# Patient Record
Sex: Female | Born: 1955 | Race: White | Hispanic: No | Marital: Married
Health system: Midwestern US, Community
[De-identification: ages and names within clinical notes are randomized; demographics above are authoritative.]

## PROBLEM LIST (undated history)

## (undated) DIAGNOSIS — R922 Inconclusive mammogram: Secondary | ICD-10-CM

## (undated) DIAGNOSIS — R9230 Dense breasts, unspecified: Secondary | ICD-10-CM

## (undated) DIAGNOSIS — M5136 Other intervertebral disc degeneration, lumbar region: Secondary | ICD-10-CM

## (undated) DIAGNOSIS — M51369 Other intervertebral disc degeneration, lumbar region without mention of lumbar back pain or lower extremity pain: Secondary | ICD-10-CM

## (undated) DIAGNOSIS — K811 Chronic cholecystitis: Secondary | ICD-10-CM

## (undated) DIAGNOSIS — R1013 Epigastric pain: Principal | ICD-10-CM

## (undated) DIAGNOSIS — M5459 Other low back pain: Principal | ICD-10-CM

## (undated) DIAGNOSIS — K5900 Constipation, unspecified: Secondary | ICD-10-CM

## (undated) DIAGNOSIS — N183 Chronic kidney disease, stage 3 unspecified (HCC): Principal | ICD-10-CM

## (undated) DIAGNOSIS — K5909 Other constipation: Secondary | ICD-10-CM

## (undated) DIAGNOSIS — M5430 Sciatica, unspecified side: Secondary | ICD-10-CM

## (undated) DIAGNOSIS — F319 Bipolar disorder, unspecified: Secondary | ICD-10-CM

## (undated) DIAGNOSIS — E119 Type 2 diabetes mellitus without complications: Secondary | ICD-10-CM

## (undated) HISTORY — PX: SHOULDER SURGERY: SHX246

## (undated) HISTORY — PX: ABDOMINAL HYSTERECTOMY: SHX81

## (undated) HISTORY — PX: EXPLORATORY LAPAROTOMY: SUR591

## (undated) HISTORY — PX: TONSILLECTOMY: SUR1361

---

## 2009-07-13 ENCOUNTER — Inpatient Hospital Stay
Admit: 2009-07-13 | Disposition: A | Discharge status: Home / Self Care | Attending: Emergency Medicine | Admitting: Emergency Medicine

## 2009-07-13 LAB — CBC PANEL ITEM
Basophils %: 0.6 % (ref 0–1)
Basophils: 0.06 10*3/uL (ref 0.0–0.20)
Eosinophils %: 0.28 10*3/uL (ref 0.0–0.60)
Eosinophils %: 2.7 % (ref 1–5)
Hematocrit: 40 % (ref 37–47)
Hgb: 13.1 G/DL (ref 12.0–16.0)
Lymphocytes: 2.99 10*3/uL (ref 1.1–4.5)
Lymphocytes: 28.8 % (ref 20–40)
MCH: 28.6 PG (ref 27–31)
MCHC: 32.8 G/DL — ABNORMAL LOW (ref 33–37)
MCV: 87.3 FL (ref 81–99)
MPV: 10.9 FL (ref 10.2–13.2)
Monocytes %: 5.9 % (ref 0–10)
Monocytes: 0.61 10*3/uL (ref 0.0–0.9)
Neutrophils %: 62 % (ref 50–70)
Neutrophils Absolute: 6.44 10*3/uL (ref 1.5–7.5)
Platelet Count: 222 (ref 130–400)
RBC: 4.58 MIL/uL (ref 4.2–5.4)
RDW: 12.9 % (ref 11.0–14.0)
WBC: 10.38 10*3/uL (ref 4.8–10.8)

## 2009-07-13 LAB — COMPREHENSIVE PANEL
ALT: 58 IU/L — ABNORMAL HIGH (ref 7–35)
AST: 22 IU/L (ref 8–41)
Albumin: 4.6 G/DL (ref 3.4–4.8)
Alk Phos: 75 IU/L (ref 18–125)
Anion Gap: 7 MEQ/L (ref 7–19)
BUN: 9 MG/DL (ref 6–20)
CO2: 28 MEQ/L (ref 23–29)
Calcium: 10.6 MG/DL — ABNORMAL HIGH (ref 8.5–10.3)
Chloride: 109 MEQ/L (ref 98–111)
Creatinine: 1 MG/DL (ref 0.3–1.3)
Gfr Calculated: 62 mL/min (ref >59)
Glucose: 105 MG/DL
Potassium: 4.5 mEq/L (ref 3.5–5.0)
Sodium: 144 MEQ/L (ref 136–145)
Total Bilirubin: 0.5 MG/DL (ref 0.3–1.2)
Total Protein: 6.9 G/DL (ref 6.4–8.3)

## 2009-07-13 LAB — URINALYSIS
Bilirubin, Urine: NEGATIVE
Glucose, Ur: NEGATIVE MG/DL
Ketones, Urine: NEGATIVE MG/DL
Nitrite, Urine: NEGATIVE
Occult Blood,Urine: NEGATIVE
Protein, UA: NEGATIVE MG/DL
Specific Gravity, Urine: <=1.005 (ref 1.001–1.035)
Urobilinogen, Urine: 0.2 EU/DL
pH, UA: 7.5

## 2009-07-13 LAB — MULTI DRUG SCREEN, URINE
Amphetamines: NEGATIVE
Barbiturates: NEGATIVE
Benzodiazepines: NEGATIVE
Cannabinoids: NEGATIVE
Cocaine: NEGATIVE
Opiate Scrn, Interp: NEGATIVE

## 2009-07-13 LAB — ETHANOL
Alcohol Control: 40 MG/DL
Alcohol, Ethyl (B): <10 MG/DL
Phlebotomist: 8102

## 2009-07-14 LAB — TSH: TSH: 2.247 u[IU]/mL (ref 0.35–5.5)

## 2009-07-14 LAB — FREE T4: T4 Free: 1.1 NG/DL (ref 0.8–1.75)

## 2009-07-14 LAB — FERRITIN: Ferritin: 122 NG/ML (ref 10–291)

## 2009-07-15 LAB — CULTURE, URINE

## 2009-07-15 LAB — LITHIUM LEVEL: Lithium Lvl: 1.42 MEQ/L — ABNORMAL HIGH (ref 0.6–1.2)

## 2009-07-17 LAB — VITAMIN D 25 HYDROXY: Vit D, 25-Hydroxy: 13 ng/mL — ABNORMAL LOW (ref 30–80)

## 2009-07-18 LAB — LITHIUM LEVEL: Lithium Lvl: 0.97 MEQ/L (ref 0.6–1.2)

## 2009-07-19 LAB — LITHIUM LEVEL: Lithium Lvl: 1.12 MEQ/L (ref 0.6–1.2)

## 2009-07-20 LAB — FOLATE: Folic Acid, Serum: 7.45 NG/ML (ref 5.4–24.0)

## 2009-07-20 LAB — FREE T4: T4 Free: 0.9 NG/DL (ref 0.8–1.75)

## 2009-07-20 LAB — T3, FREE: T3, Free: 2.6 PG/ML (ref 2.3–4.2)

## 2009-07-20 LAB — VITAMIN B12: Vitamin B-12: 548 PG/ML (ref 211–911)

## 2009-07-20 LAB — TSH: TSH: 3.392 u[IU]/mL (ref 0.35–5.5)

## 2010-07-15 LAB — CBC PANEL ITEM
Basophils %: 0.9 % (ref 0–1)
Basophils: 0.09 10*3/uL (ref 0.0–0.20)
Eosinophils %: 0.62 10*3/uL — ABNORMAL HIGH (ref 0.0–0.60)
Eosinophils %: 6.3 % — ABNORMAL HIGH (ref 1–5)
Hematocrit: 37.7 % (ref 37–47)
Hgb: 12.8 G/DL (ref 12.0–16.0)
Lymphocytes: 2.94 10*3/uL (ref 1.1–4.5)
Lymphocytes: 30.1 % (ref 20–40)
MCH: 29.4 PG (ref 27–31)
MCHC: 34 G/DL (ref 33–37)
MCV: 86.5 FL (ref 81–99)
MPV: 10.6 FL (ref 10.2–13.2)
Monocytes %: 5.7 % (ref 0–10)
Monocytes: 0.56 10*3/uL (ref 0.0–0.9)
Neutrophils %: 57 % (ref 50–70)
Neutrophils Absolute: 5.57 10*3/uL (ref 1.5–7.5)
Platelet Count: 265 (ref 130–400)
RBC: 4.36 MIL/uL (ref 4.2–5.4)
RDW: 12.5 % (ref 11.0–14.0)
WBC: 9.78 10*3/uL (ref 4.8–10.8)

## 2010-07-15 LAB — COMPREHENSIVE PANEL
ALT: 31 IU/L (ref 7–35)
AST: 16 IU/L (ref 8–41)
Albumin: 4.3 G/DL (ref 3.4–4.8)
Alk Phos: 58 IU/L (ref 18–125)
Anion Gap: 8 MEQ/L (ref 7–19)
BUN: 14 MG/DL (ref 6–20)
CO2: 26 MEQ/L (ref 23–29)
Calcium: 9.9 MG/DL (ref 8.5–10.3)
Chloride: 111 MEQ/L (ref 98–111)
Creatinine: 1.3 MG/DL (ref 0.3–1.3)
Gfr Calculated: 45 mL/min — ABNORMAL LOW (ref >59)
Glucose: 85 MG/DL
Potassium: 4.1 mEq/L (ref 3.5–5.0)
Sodium: 145 MEQ/L (ref 136–145)
Total Bilirubin: 0.3 MG/DL (ref 0.3–1.2)
Total Protein: 6.7 G/DL (ref 6.4–8.3)

## 2010-07-15 LAB — LITHIUM LEVEL
Lithium Lvl: 1.48 MEQ/L — ABNORMAL HIGH (ref 0.6–1.2)
Lithium Lvl: 1.35 MEQ/L — ABNORMAL HIGH (ref 0.6–1.2)

## 2010-09-24 LAB — LITHIUM LEVEL: Lithium Lvl: 1.1 MEQ/L (ref 0.6–1.2)

## 2012-01-03 LAB — CBC PANEL ITEM
Basophils %: 0.9 % (ref 0–1)
Basophils: 0.07 10*3/uL (ref 0.0–0.20)
Eosinophils %: 0.29 10*3/uL (ref 0.0–0.60)
Eosinophils %: 3.6 % (ref 1–5)
Hematocrit: 36.6 % — ABNORMAL LOW (ref 37–47)
Hemoglobin: 12.7 G/DL (ref 12.0–16.0)
Lymphocytes: 2.62 10*3/uL (ref 1.1–4.5)
Lymphocytes: 32.3 % (ref 20–40)
MCH: 29 PG (ref 27–31)
MCHC: 34.7 G/DL (ref 33–37)
MCV: 83.6 FL (ref 81–99)
MPV: 10.2 FL (ref 10.2–13.2)
Monocytes %: 5.4 % (ref 0–10)
Monocytes: 0.44 10*3/uL (ref 0.0–0.9)
Neutrophils %: 57.8 % (ref 50–70)
Neutrophils Absolute: 4.7 10*3/uL (ref 1.5–7.5)
Platelets: 234 10*3/uL (ref 130–400)
RBC: 4.38 MIL/uL (ref 4.2–5.4)
RDW: 12.5 % (ref 11.0–14.0)
WBC: 8.12 10*3/uL (ref 4.8–10.8)

## 2012-01-03 LAB — COMPREHENSIVE PANEL
ALT: 16 U/L (ref 5–33)
AST: 10 U/L (ref 5–32)
Albumin: 4.4 G/DL (ref 3.5–5.2)
Alkaline Phosphatase: 59 U/L (ref 35–187)
Anion Gap: 12 mmol/L (ref 7–19)
BUN: 16 MG/DL (ref 5–20)
CO2: 25 mmol/L (ref 22–29)
Calcium: 9.9 MG/DL (ref 8.8–10.2)
Chloride: 104 mmol/L (ref 98–111)
Creatinine: 1.3 MG/DL — ABNORMAL HIGH (ref 0.5–0.9)
Gfr Calculated: 45 mL/min — ABNORMAL LOW (ref 59–300)
Glucose: 130 MG/DL
Potassium: 4 mmol/L (ref 3.5–5.0)
Sodium: 141 mmol/L (ref 136–145)
Total Bilirubin: 0.3 MG/DL (ref 0.3–1.2)
Total Protein: 6.4 G/DL — ABNORMAL LOW (ref 6.6–8.7)

## 2012-01-03 LAB — AMYLASE: Amylase: 129 U/L — ABNORMAL HIGH (ref 28–100)

## 2012-01-03 LAB — LIPASE: Lipase: 97 U/L — ABNORMAL HIGH (ref 13–60)

## 2012-01-03 LAB — POCT TROPONIN: POC Troponin I: 0 NG/ML — ABNORMAL LOW (ref 0.000–0.08)

## 2012-01-04 LAB — POCT TROPONIN: POC Troponin I: 0 NG/ML — ABNORMAL LOW (ref 0.000–0.08)

## 2012-02-14 ENCOUNTER — Inpatient Hospital Stay
Admit: 2012-02-14 | Disposition: A | Discharge status: Home / Self Care | Attending: Emergency Medicine | Admitting: Emergency Medicine

## 2012-02-14 LAB — ETHANOL
Alcohol Control: 38.8 MG/DL
Alcohol, Ethyl (B): <10 MG/DL

## 2012-02-14 LAB — COMPREHENSIVE PANEL
ALT: 19 U/L (ref 5–33)
AST: 11 U/L (ref 5–32)
Albumin: 4.4 G/DL (ref 3.5–5.2)
Alkaline Phosphatase: 61 U/L (ref 35–187)
Anion Gap: 11 mmol/L (ref 7–19)
BUN: 27 MG/DL — ABNORMAL HIGH (ref 5–20)
CO2: 22 mmol/L (ref 22–29)
Calcium: 9.8 MG/DL (ref 8.8–10.2)
Chloride: 106 mmol/L (ref 98–111)
Creatinine: 1.5 MG/DL — ABNORMAL HIGH (ref 0.5–0.9)
Gfr Calculated: 38 mL/min — ABNORMAL LOW (ref 59–300)
Glucose: 94 MG/DL
Potassium: 4.1 mmol/L (ref 3.5–5.0)
Sodium: 139 mmol/L (ref 136–145)
Total Bilirubin: 0.2 MG/DL — ABNORMAL LOW (ref 0.3–1.2)
Total Protein: 6.8 G/DL (ref 6.6–8.7)

## 2012-02-14 LAB — CBC PANEL ITEM
Basophils %: 0.8 % (ref 0–1)
Basophils: 0.06 10*3/uL (ref 0.0–0.20)
Eosinophils %: 0.28 10*3/uL (ref 0.0–0.60)
Eosinophils %: 3.6 % (ref 1–5)
Hematocrit: 37.6 % (ref 37–47)
Hemoglobin: 12.6 G/DL (ref 12.0–16.0)
Lymphocytes: 2.38 10*3/uL (ref 1.1–4.5)
Lymphocytes: 30.7 % (ref 20–40)
MCH: 28.7 PG (ref 27–31)
MCHC: 33.5 G/DL (ref 33–37)
MCV: 85.6 FL (ref 81–99)
MPV: 10 FL — ABNORMAL LOW (ref 10.2–13.2)
Monocytes %: 6.5 % (ref 0–10)
Monocytes: 0.5 10*3/uL (ref 0.0–0.9)
Neutrophils %: 58.4 % (ref 50–70)
Neutrophils Absolute: 4.52 10*3/uL (ref 1.5–7.5)
Platelets: 236 10*3/uL (ref 130–400)
RBC: 4.39 MIL/uL (ref 4.2–5.4)
RDW: 12.6 % (ref 11.0–14.0)
WBC: 7.74 10*3/uL (ref 4.8–10.8)

## 2012-02-14 LAB — MULTI DRUG SCREEN, URINE
Amphetamines: NEGATIVE
Barbiturates: NEGATIVE
Benzodiazepines: NEGATIVE
Cannabinoids: NEGATIVE
Cocaine: NEGATIVE
Opiate Scrn, Interp: NEGATIVE

## 2012-05-22 LAB — URINALYSIS
Bilirubin Urine: NEGATIVE
Glucose, Ur: NEGATIVE MG/DL
Ketones, Urine: NEGATIVE MG/DL
Nitrite, Urine: NEGATIVE
Occult Blood,Urine: NEGATIVE
Protein, UA: NEGATIVE MG/DL
Specific Gravity, Urine: 1.005 (ref 1.001–1.035)
Urobilinogen, Urine: 0.2 EU/DL
pH, UA: 6

## 2012-05-22 LAB — COMPREHENSIVE PANEL
ALT: 18 U/L (ref 5–33)
AST: 10 U/L (ref 5–32)
Albumin: 4 G/DL (ref 3.5–5.2)
Alkaline Phosphatase: 52 U/L (ref 35–187)
Anion Gap: 12 mmol/L (ref 7–19)
BUN: 25 MG/DL — ABNORMAL HIGH (ref 5–20)
CO2: 21 mmol/L — ABNORMAL LOW (ref 22–29)
Calcium: 9.3 MG/DL (ref 8.8–10.2)
Chloride: 104 mmol/L (ref 98–111)
Creatinine: 1.4 MG/DL — ABNORMAL HIGH (ref 0.5–0.9)
Gfr Calculated: 41 mL/min — ABNORMAL LOW (ref 59–300)
Glucose: 139 MG/DL
Potassium: 3.8 mmol/L (ref 3.5–5.0)
Sodium: 137 mmol/L (ref 136–145)
Total Bilirubin: 0.2 MG/DL — ABNORMAL LOW (ref 0.3–1.2)
Total Protein: 6 G/DL — ABNORMAL LOW (ref 6.6–8.7)

## 2012-05-22 LAB — CBC PANEL ITEM
Basophils %: 0.5 % (ref 0–1)
Basophils: 0.04 10*3/uL (ref 0.0–0.20)
Eosinophils %: 0.33 10*3/uL (ref 0.0–0.60)
Eosinophils %: 4 % (ref 1–5)
Hematocrit: 36.9 % — ABNORMAL LOW (ref 37–47)
Hemoglobin: 12.6 G/DL (ref 12.0–16.0)
Lymphocytes: 3.3 10*3/uL (ref 1.1–4.5)
Lymphocytes: 40 % (ref 20–40)
MCH: 28.5 PG (ref 27–31)
MCHC: 34.1 G/DL (ref 33–37)
MCV: 83.5 FL (ref 81–99)
MPV: 10 FL — ABNORMAL LOW (ref 10.2–13.2)
Monocytes %: 8.1 % (ref 0–10)
Monocytes: 0.67 10*3/uL (ref 0.0–0.9)
Neutrophils %: 47.4 % — ABNORMAL LOW (ref 50–70)
Neutrophils Absolute: 3.92 10*3/uL (ref 1.5–7.5)
Platelets: 176 10*3/uL (ref 130–400)
RBC: 4.42 MIL/uL (ref 4.2–5.4)
RDW: 12.5 % (ref 11.0–14.0)
WBC: 8.26 10*3/uL (ref 4.8–10.8)

## 2012-05-22 LAB — AMYLASE: Amylase: 141 U/L — ABNORMAL HIGH (ref 28–100)

## 2012-05-22 LAB — LIPASE: Lipase: 109 U/L — ABNORMAL HIGH (ref 13–60)

## 2012-05-25 LAB — URINALYSIS
Bacteria, UA: NEGATIVE /HPF
Bilirubin Urine: NEGATIVE
Epithelial Cells, Wet Prep: 2 /HPF (ref 0–3)
Glucose, Ur: NEGATIVE MG/DL
Hyaline Casts, UA: 0 /LPF (ref 0–3)
Ketones, Urine: NEGATIVE MG/DL
Nitrite, Urine: NEGATIVE
Occult Blood,Urine: NEGATIVE
Protein, UA: NEGATIVE MG/DL
RBC, UA: 2 /HPF (ref 0–3)
Specific Gravity, Urine: 1.008 (ref 1.001–1.035)
Urobilinogen, Urine: 0.2 EU/DL
WBC, UA: 10 /HPF — ABNORMAL HIGH (ref 0–3)
pH, UA: 6.5

## 2012-05-25 LAB — COMPREHENSIVE PANEL
ALT: 18 U/L (ref 5–33)
AST: 11 U/L (ref 5–32)
Albumin: 4 G/DL (ref 3.5–5.2)
Alkaline Phosphatase: 49 U/L (ref 35–187)
Anion Gap: 9 mmol/L (ref 7–19)
BUN: 18 MG/DL (ref 5–20)
CO2: 26 mmol/L (ref 22–29)
Calcium: 9.5 MG/DL (ref 8.8–10.2)
Chloride: 105 mmol/L (ref 98–111)
Creatinine: 1.3 MG/DL — ABNORMAL HIGH (ref 0.5–0.9)
Gfr Calculated: 45 mL/min — ABNORMAL LOW (ref 59–300)
Glucose: 91 MG/DL
Potassium: 4 mmol/L (ref 3.5–5.0)
Sodium: 140 mmol/L (ref 136–145)
Total Bilirubin: 0.2 MG/DL — ABNORMAL LOW (ref 0.3–1.2)
Total Protein: 6.2 G/DL — ABNORMAL LOW (ref 6.6–8.7)

## 2012-05-25 LAB — CBC PANEL ITEM
Basophils %: 0.4 % (ref 0–1)
Basophils: 0.03 10*3/uL (ref 0.0–0.20)
Eosinophils %: 0.31 10*3/uL (ref 0.0–0.60)
Eosinophils %: 4.6 % (ref 1–5)
Hematocrit: 36.7 % — ABNORMAL LOW (ref 37–47)
Hemoglobin: 12.5 G/DL (ref 12.0–16.0)
Lymphocytes: 2.27 10*3/uL (ref 1.1–4.5)
Lymphocytes: 33.3 % (ref 20–40)
MCH: 29.3 PG (ref 27–31)
MCHC: 34.1 G/DL (ref 33–37)
MCV: 86.2 FL (ref 81–99)
MPV: 11.2 FL (ref 10.2–13.2)
Monocytes %: 8.7 % (ref 0–10)
Monocytes: 0.59 10*3/uL (ref 0.0–0.9)
Neutrophils %: 53 % (ref 50–70)
Neutrophils Absolute: 3.61 10*3/uL (ref 1.5–7.5)
Platelets: 190 10*3/uL (ref 130–400)
RBC: 4.26 MIL/uL (ref 4.2–5.4)
RDW: 12.4 % (ref 11.0–14.0)
WBC: 6.81 10*3/uL (ref 4.8–10.8)

## 2012-05-25 LAB — POCT TROPONIN: POC Troponin I: 0 NG/ML — ABNORMAL LOW (ref 0.000–0.08)

## 2012-05-27 LAB — COMPREHENSIVE PANEL
ALT: 21 U/L (ref 5–33)
AST: 13 U/L (ref 5–32)
Albumin: 4.5 G/DL (ref 3.5–5.2)
Alkaline Phosphatase: 56 U/L (ref 35–187)
Anion Gap: 10 mmol/L (ref 7–19)
BUN: 18 MG/DL (ref 5–20)
CO2: 26 mmol/L (ref 22–29)
Calcium: 10.4 MG/DL — ABNORMAL HIGH (ref 8.8–10.2)
Chloride: 103 mmol/L (ref 98–111)
Creatinine: 1.3 MG/DL — ABNORMAL HIGH (ref 0.5–0.9)
Gfr Calculated: 45 mL/min — ABNORMAL LOW (ref 59–300)
Glucose: 124 MG/DL
Potassium: 3.9 mmol/L (ref 3.5–5.0)
Sodium: 139 mmol/L (ref 136–145)
Total Bilirubin: 0.3 MG/DL (ref 0.3–1.2)
Total Protein: 7.3 G/DL (ref 6.6–8.7)

## 2012-05-27 LAB — URINALYSIS
Bacteria, UA: NEGATIVE /HPF
Bilirubin Urine: NEGATIVE
Epithelial Cells, Wet Prep: 0 /HPF (ref 0–3)
Glucose, Ur: NEGATIVE MG/DL
Hyaline Casts, UA: 0 /LPF (ref 0–3)
Ketones, Urine: NEGATIVE MG/DL
Nitrite, Urine: NEGATIVE
Occult Blood,Urine: NEGATIVE
Protein, UA: NEGATIVE MG/DL
RBC, UA: 2 /HPF (ref 0–3)
Specific Gravity, Urine: 1.004 (ref 1.001–1.035)
Urobilinogen, Urine: 0.2 EU/DL
WBC, UA: 8 /HPF — ABNORMAL HIGH (ref 0–3)
pH, UA: 7.5

## 2012-05-27 LAB — CBC PANEL ITEM
Basophils %: 0.8 % (ref 0–1)
Basophils: 0.06 10*3/uL (ref 0.0–0.20)
Eosinophils %: 0.26 10*3/uL (ref 0.0–0.60)
Eosinophils %: 3.3 % (ref 1–5)
Hematocrit: 41.3 % (ref 37–47)
Hemoglobin: 13.9 G/DL (ref 12.0–16.0)
Lymphocytes: 2.69 10*3/uL (ref 1.1–4.5)
Lymphocytes: 34.4 % (ref 20–40)
MCH: 28.7 PG (ref 27–31)
MCHC: 33.7 G/DL (ref 33–37)
MCV: 85.3 FL (ref 81–99)
MPV: 10.7 FL (ref 10.2–13.2)
Monocytes %: 6.4 % (ref 0–10)
Monocytes: 0.5 10*3/uL (ref 0.0–0.9)
Neutrophils %: 55.1 % (ref 50–70)
Neutrophils Absolute: 4.3 10*3/uL (ref 1.5–7.5)
Platelets: 217 10*3/uL (ref 130–400)
RBC: 4.84 MIL/uL (ref 4.2–5.4)
RDW: 12.6 % (ref 11.0–14.0)
WBC: 7.81 10*3/uL (ref 4.8–10.8)

## 2012-05-27 LAB — AMYLASE: Amylase: 133 U/L — ABNORMAL HIGH (ref 28–100)

## 2012-05-27 LAB — LIPASE: Lipase: 83 U/L — ABNORMAL HIGH (ref 13–60)

## 2012-06-10 LAB — CBC PANEL ITEM
Basophils %: 1 % (ref 0–1)
Basophils: 0.07 10*3/uL (ref 0.0–0.20)
Eosinophils %: 0.22 10*3/uL (ref 0.0–0.60)
Eosinophils %: 3.2 % (ref 1–5)
Hematocrit: 39.5 % (ref 37–47)
Hemoglobin: 13.2 G/DL (ref 12.0–16.0)
Lymphocytes: 2.28 10*3/uL (ref 1.1–4.5)
Lymphocytes: 32.9 % (ref 20–40)
MCH: 28.7 PG (ref 27–31)
MCHC: 33.4 G/DL (ref 33–37)
MCV: 85.9 FL (ref 81–99)
MPV: 10.2 FL (ref 10.2–13.2)
Monocytes %: 6.8 % (ref 0–10)
Monocytes: 0.47 10*3/uL (ref 0.0–0.9)
Neutrophils %: 56.1 % (ref 50–70)
Neutrophils Absolute: 3.9 10*3/uL (ref 1.5–7.5)
Platelets: 182 10*3/uL (ref 130–400)
RBC: 4.6 MIL/uL (ref 4.2–5.4)
RDW: 12.9 % (ref 11.0–14.0)
WBC: 6.94 10*3/uL (ref 4.8–10.8)

## 2012-06-10 LAB — COMPREHENSIVE PANEL
ALT: 24 U/L (ref 5–33)
AST: 13 U/L (ref 5–32)
Albumin: 4.2 G/DL (ref 3.5–5.2)
Alkaline Phosphatase: 57 U/L (ref 35–187)
Anion Gap: 10 mmol/L (ref 7–19)
BUN: 15 MG/DL (ref 5–20)
CO2: 25 mmol/L (ref 22–29)
Calcium: 9.6 MG/DL (ref 8.8–10.2)
Chloride: 104 mmol/L (ref 98–111)
Creatinine: 1.3 MG/DL — ABNORMAL HIGH (ref 0.5–0.9)
Gfr Calculated: 45 mL/min — ABNORMAL LOW (ref 59–300)
Glucose: 113 MG/DL
Potassium: 4.2 mmol/L (ref 3.5–5.0)
Sodium: 139 mmol/L (ref 136–145)
Total Bilirubin: 0.3 MG/DL (ref 0.3–1.2)
Total Protein: 6.3 G/DL — ABNORMAL LOW (ref 6.6–8.7)

## 2012-06-10 LAB — URINALYSIS
Bilirubin Urine: NEGATIVE
Glucose, Ur: NEGATIVE MG/DL
Nitrite, Urine: NEGATIVE
Occult Blood,Urine: NEGATIVE
Protein, UA: NEGATIVE MG/DL
Specific Gravity, Urine: 1.021 (ref 1.001–1.035)
Urobilinogen, Urine: 0.2 EU/DL
pH, UA: 6

## 2012-06-30 NOTE — Patient Instructions (Addendum)
Thank you for enrolling in MyChart. Please follow the instructions below to securely access your online medical record. MyChart allows you to send messages to your doctor, view your test results, renew your prescriptions, schedule appointments, and more.     How Do I Sign Up?  1. In your Internet browser, go to [email or web address]  2. Click on the Sign Up Now link in the Sign In box. You will see the New Member Sign Up page.  3. Enter your MyChart Access Code exactly as it appears below. You will not need to use this code after you've completed the sign-up process. If you do not sign up before the expiration date, you must request a new code.  MyChart Access Code: [email or web address]  Expires: 08/29/2012 10:36 AM    4. Enter your Social Security Number ([id number]) and Date of Birth (mm/dd/yyyy) as indicated and click Submit. You will be taken to the next sign-up page.  5. Create a MyChart ID. This will be your MyChart login ID and cannot be changed, so think of one that is secure and easy to remember.  6. Create a MyChart password. You can change your password at any time.  7. Enter your Password Reset Question and Answer. This can be used at a later time if you forget your password.   8. Enter your e-mail address. You will receive e-mail notification when new information is available in MyChart.  9. Click Sign Up. You can now view your medical record.     Additional Information  If you have questions, please contact your physician practice where you receive care. Remember, MyChart is NOT to be used for urgent needs. For medical emergencies, dial 911.    You are going to have a colonoscopy and here are some instructions:  You will be given specific directions regarding restrictions to diet and bowel prep instructions including laxatives. Please read these instructions one week prior to your scheduled procedure to ensure that you are prepared.     Follow prep instructions provided for bowel prep. Take  all of the bowel prep as directed. If you are having problems with nausea, stop your prep for 30-45 min to allow the nausea to subside before resuming your prep.     Nothing to eat or drink after midnight the day of the procedure EXCEPT:  PLEASE TAKE MEDICATION(S) FOR HIGH BLOOD PRESSURE, THYROID, SEIZURES, AND HEART THE MORNING OF THE PROCEDURE WITH A SIP OF WATER  AT LEAST 2 HOURS PRIOR TO ARRIVAL TIME.   YOU MAY ALSO TAKE ANY INHALERS YOU ARE PRESCRIBED.    You will not be able to drive for 24 hours after the procedure due to sedation. Bring a driver with you the day of the procedure.   No aspirin, ibuprofen, naproxen, fish oil or vitamin E for 5 days before procedure.     If you are on blood thinners, clearance from the prescribing physician will be obtained before your procedure is scheduled. Increased risks may be associated with discontinuation of your blood thinner and include, but not limited to, stroke, TIA, or cardiac event.    If polyps are removed during the procedure they will be sent to a pathologist for analysis. You will be notified by mail of the pathology results in 2-3 weeks.     Your physician may also schedule a follow up appointment with the nurse practitioner to discuss pathology, symptoms or to check if you have had any problems related to your procedure. If you prefer  not to return to the office after your procedure please discuss this with your physician on the day of your colonoscopy.   Final recommendations are based on the pathologist report.     Your diet before a colonoscopy bowel preparation is very important to ensure a successful colon exam. It is recommended to consider certain changes to your diet three to four days prior to the procedure.  Remember that your bowels need to be empty for the exam.    What foods are good to eat?  Cut down on heavy solid foods three to four days before the procedure and start introducing lighter meals to your diet. The following food suggestions are  a good part of your diet before a colonoscopy bowel preparation.  Light meat that is easily digestible such as chicken (without the skin)   Potatoes without skin   Cheese   Eggs   A light meal of steamed white fish   Light clear soups    Foods and drinks to avoid  Avoid foods that contain too much fiber. Stay clear of dark colored beverages. They can stick to the walls of the digestive tract and make it difficult to differentiate from blood. Some of these foods are:  Red meat, rice, nuts and vegetables   Milk, other milk based fluids and cream   Most fruit and puddings   Whole grain pasta   Cereals, bran and seeds   Colored beverages, especially those that are red or purple in color   Red colored Jell-O   On the day before the colonoscopy, continue to drink plenty of clear fluids. It is important   to keep yourself hydrated before the exam.     Please follow all instructions as provided for cleansing the bowel. Failure to have an adequately prepped colon may cause you to have incomplete exam with further testing required.

## 2012-06-30 NOTE — Progress Notes (Signed)
Subjective:      Patient ID: Joyce Cohen is a 57 y.o. female.  Chief Complaint   Patient presents with   . Abdominal Pain   . Constipation   . Rectal Bleeding         HPI  PCP: Calton Dach  Pt here for c/o:  (1) RLQ abd pain.  Onset x 20 years, worsening over the past 1-2 months. Was told it was adhesions; has had lysis of adhesions x 2 in the past--last LOA 15 years ago. Location is RLQ. Alleviating factors is massaging the right lower abdomen. Abd pain tends to be worse with eating. Has been on light and liquid diet for several days. Abd pain #3 on scale, progresses over the day.  (2) rectal bleeding. Onset x 1 month. Notices bright red blood on tissue with wiping, not mixed in stool. No mucous in stool. No unintentional weight loss. Admits to known hx of hemorrhoids. Wants banding if she's a candidate.  (3) constipation. Onset x 6 weeks. On lactulose 15 ml daily. Still has hard pebble stools and has to strain to get it out. Has tried and failed miralax 1 capful daily (x5-6 weeks) with no improvement.  (4) heartburn. Onset x 4 weeks. Has tried Vear Clock MOM with no relief. Is going to try Zantac 150mg  daily. Denies dysphagia and odynophagia.    CT abd/pelvis 05/25/2012-with no acute abnormality    Last colonoscopy 04/2011 (Dr Reed)-"normal" per pt recall.    Family HX:  Pt denies family hx of colon polyps, colon CA, inflammatory bowel dx, gastric CA and esophageal CA.    Past Medical History   Diagnosis Date   . Pleurisy without mention of effusion or current tuberculosis    . Unspecified asthma(493.90)    . Unspecified hemorrhoids without mention of complication    . Abdominal pain, right lower quadrant    . Unspecified constipation    . Arthritis    . Bipolar disorder    . MVP (mitral valve prolapse)    . Cancer      skin cancer   . 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 Stomach ulcer      as a child     Past Surgical History   Procedure Laterality Date   . Rotator cuff repair Right    .  Tonsillectomy     . Hysterectomy     . Appendectomy     . Wisdom tooth extraction     . Abdominal exploration surgery       times two due to adhesions   . Colonoscopy  05/19/11     Dr Renato Gails     History     Social History   . Marital Status: Married     Spouse Name: N/A     Number of Children: N/A   . Years of Education: N/A     Social History Main Topics   . Smoking status: Never Smoker    . Smokeless tobacco: Never Used   . Alcohol Use: No   . Drug Use: No   . Sexually Active: None     Other Topics Concern   . None     Social History Narrative   . None     Current Outpatient Prescriptions on File Prior to Visit   Medication Sig Dispense Refill   . Lactulose Encephalopathy (GENERLAC PO) Take  by mouth.       . sertraline (ZOLOFT)  50 MG tablet Take 50 mg by mouth daily.       . QUEtiapine (SEROQUEL) 50 MG tablet Take 50 mg by mouth daily.       . traZODone (DESYREL) 150 MG tablet Take 300 mg by mouth nightly.       Marland Kitchen buPROPion SR (WELLBUTRIN SR) 150 MG SR tablet Take 150 mg by mouth 2 times daily.       Marland Kitchen lamoTRIgine (LAMICTAL) 150 MG tablet Take 150 mg by mouth daily.       . Loratadine (CLARITIN) 10 MG CAPS Take 1 tablet by mouth daily.         No current facility-administered medications on file prior to visit.       Review of Systems   Constitutional: Positive for fatigue. Negative for fever, appetite change and unexpected weight change.   HENT: Negative for hearing loss and sore throat.    Eyes: Negative for photophobia and visual disturbance.   Respiratory: Negative for cough, choking and wheezing.    Cardiovascular: Negative for chest pain, palpitations and leg swelling.   Gastrointestinal: Positive for nausea (at times), abdominal pain, constipation and anal bleeding. Negative for vomiting, diarrhea, blood in stool, abdominal distention and rectal pain.   Genitourinary: Negative for dysuria and hematuria.   Musculoskeletal: Positive for arthralgias. Negative for myalgias and back pain.   Skin: Negative for  rash.   Allergic/Immunologic: Negative for immunocompromised state.   Neurological: Negative for dizziness and seizures.   Hematological: Does not bruise/bleed easily.   Psychiatric/Behavioral: Negative for confusion and dysphoric mood. The patient is not nervous/anxious.         Hx bipolar       Objective:   Physical Exam   Nursing note and vitals reviewed.  Constitutional: She is oriented to person, place, and time. She appears well-developed and well-nourished.   HENT:   Head: Normocephalic and atraumatic.   Eyes: Conjunctivae and EOM are normal. Pupils are equal, round, and reactive to light.   Neck: Normal range of motion. Neck supple. No thyromegaly present.   Cardiovascular: Normal rate, regular rhythm and normal heart sounds.  Exam reveals no gallop and no friction rub.    No murmur heard.  Pulmonary/Chest: Effort normal and breath sounds normal. No respiratory distress.   Abdominal: Soft. Bowel sounds are normal. She exhibits no distension. There is tenderness (RLQ with mild palpation). There is no rebound.   Musculoskeletal: Normal range of motion.   Neurological: She is alert and oriented to person, place, and time. No cranial nerve deficit.   Skin: Skin is warm and dry. No pallor.   Psychiatric: She has a normal mood and affect. Judgment normal.       Assessment:      1. Constipation  COLONOSCOPY W/ OR W/O BIOPSY   2. Rectal bleeding  COLONOSCOPY W/ OR W/O BIOPSY   3. RLQ abdominal pain  COLONOSCOPY W/ OR W/O BIOPSY            Plan:      1. Increase lactulose to 15ml 2-3x daily til she achieves soft BM daily  2. Schedule outpatient colonoscopy with MAC anesthesia.  Patient advised no Aspirin, Fish Oil, Vit E or NSAIDs 5 (five) days before procedure.  Follow-up Visit: per Dr  Zollie Beckers  Pt education:  Risks, benefits, and alternatives to colonoscopy were discussed. Risks of colonoscopy include, but are not limited to, perforation, bleeding, and infection, Risk of perforation and bleeding are increased if  there  is a polyp removed. All questions answered to the satisfaction of the patient. Pt is agreeable to proceed.   3. Pt instructed that if colon nondiagnostic for source of her abd pain we may do referral to surgeon of her choice. She voiced understanding.

## 2012-07-06 NOTE — Telephone Encounter (Signed)
Patient's husband is calling from [phone] states his wife Joyce Cohen is in extreme pain in the RLQ area and is scheduled for a colonoscopy on 07-15-12 with Dr.Hendon, she is currently taking the Lactulose and it does help her to have BM's and he has taken her to the ER 3-4 times due to the pain, he is asking if you can prescribe something to help with this. Dh cma

## 2012-07-08 ENCOUNTER — Encounter

## 2012-07-08 MED ORDER — LACTULOSE 10 GM/15ML PO SOLN
10 GM/15ML | Freq: Three times a day (TID) | ORAL | Status: DC
Start: 2012-07-08 — End: 2012-07-24

## 2012-07-08 NOTE — Telephone Encounter (Signed)
Pt's husband states that pt needs prescription for generlac.  She was last seen in the office by Danford Bad on 06/30/12.

## 2012-07-08 NOTE — Telephone Encounter (Signed)
Escribed it

## 2012-07-10 NOTE — Telephone Encounter (Signed)
07/09/2012 [facility] called about Joyce Cohen (DOB 03-06-2011) His insurance will only cover 30mL per day of his Latulose. How would you like to handle this please advise. AS cma

## 2012-07-10 NOTE — Telephone Encounter (Signed)
OK, pt should take 15ml BID--if this is ineffective for her constipation she shoud let us know--thanks, Shamera Yarberry

## 2012-07-14 NOTE — Telephone Encounter (Signed)
07/14/2012 Called and left a message with pt husband about her colonoscopy on 07/15/2012.

## 2012-07-23 LAB — URINALYSIS
Bacteria, UA: NEGATIVE /HPF
Bilirubin Urine: NEGATIVE
Epithelial Cells, Wet Prep: 1 /HPF (ref 0–3)
Glucose, Ur: NEGATIVE MG/DL
Hyaline Casts, UA: 0 /LPF (ref 0–3)
Ketones, Urine: NEGATIVE MG/DL
Nitrite, Urine: NEGATIVE
Occult Blood,Urine: NEGATIVE
Protein, UA: NEGATIVE MG/DL
RBC, UA: 1 /HPF (ref 0–3)
Specific Gravity, Urine: 1.012 (ref 1.001–1.035)
Urobilinogen, Urine: 0.2 EU/DL
WBC, UA: 8 /HPF — ABNORMAL HIGH (ref 0–3)
pH, UA: 6.5

## 2012-07-23 LAB — COMPREHENSIVE PANEL
ALT: 24 U/L (ref 5–33)
AST: 13 U/L (ref 5–32)
Albumin: 4.7 G/DL (ref 3.5–5.2)
Alkaline Phosphatase: 59 U/L (ref 35–187)
Anion Gap: 10 mmol/L (ref 7–19)
BUN: 19 MG/DL (ref 5–20)
CO2: 24 mmol/L (ref 22–29)
Calcium: 10.2 MG/DL (ref 8.8–10.2)
Chloride: 105 mmol/L (ref 98–111)
Creatinine: 1.3 MG/DL — ABNORMAL HIGH (ref 0.5–0.9)
Gfr Calculated: 45 mL/min — ABNORMAL LOW (ref 59–300)
Glucose: 87 MG/DL
Potassium: 4 mmol/L (ref 3.5–5.0)
Sodium: 139 mmol/L (ref 136–145)
Total Bilirubin: 0.2 MG/DL — ABNORMAL LOW (ref 0.3–1.2)
Total Protein: 7.4 G/DL (ref 6.6–8.7)

## 2012-07-23 LAB — CBC PANEL ITEM
Basophils %: 0.9 % (ref 0–1)
Basophils: 0.08 10*3/uL (ref 0.0–0.20)
Eosinophils %: 0.24 10*3/uL (ref 0.0–0.60)
Eosinophils %: 2.6 % (ref 1–5)
Hematocrit: 40.9 % (ref 37–47)
Hemoglobin: 13.7 G/DL (ref 12.0–16.0)
Lymphocytes: 3.41 10*3/uL (ref 1.1–4.5)
Lymphocytes: 36.7 % (ref 20–40)
MCH: 28.5 PG (ref 27–31)
MCHC: 33.5 G/DL (ref 33–37)
MCV: 85.2 FL (ref 81–99)
MPV: 10.5 FL (ref 10.2–13.2)
Monocytes %: 6.4 % (ref 0–10)
Monocytes: 0.59 10*3/uL (ref 0.0–0.9)
Neutrophils %: 53.4 % (ref 50–70)
Neutrophils Absolute: 4.96 10*3/uL (ref 1.5–7.5)
Platelets: 209 10*3/uL (ref 130–400)
RBC: 4.8 MIL/uL (ref 4.2–5.4)
RDW: 12.7 % (ref 11.0–14.0)
WBC: 9.28 10*3/uL (ref 4.8–10.8)

## 2012-07-24 MED ORDER — LINACLOTIDE 290 MCG PO CAPS
290 MCG | ORAL_CAPSULE | Freq: Every day | ORAL | Status: DC
Start: 2012-07-24 — End: 2012-12-03

## 2012-07-24 NOTE — Progress Notes (Signed)
Subjective:      Patient ID: Joyce Cohen is a 57 y.o. female.  Chief Complaint   Patient presents with   . Colonoscopy     f/u. Linzess working     HPI  PCP: Calton Dach  Pt here s/p colonoscopy to discuss results. Denies post-procedure complications. Was initiated on Linzess 145 by Dr Zollie Beckers after colonoscopy and she has been taking this and it is working better than the lactulose, but still doesn't have BM daily. Still has RLQ pain she attributes to adhesions, questions about pain management and pain meds. I instructed her we do not give pain meds here-that she will have to contact her PCP.    Last colonoscopy 07/15/2012 (Hendon)-polyp; 5 year recall  (1) POLYP, SIGMOID COLON:  ---FRAGMENT OF BENIGN COLONIC MUCOSA DEMONSTRATING SLIGHT GLANDULAR  HYPERPLASTIC CHANGES AND CAUTERY ARTIFACT.    CT abd/pelvis 05/25/2012-with no acute abnormality     Family HX: Pt denies family hx of colon polyps, colon CA, inflammatory bowel dx, gastric CA and esophageal CA    Past Medical History   Diagnosis Date   . Pleurisy without mention of effusion or current tuberculosis    . Unspecified asthma(493.90)    . Unspecified hemorrhoids without mention of complication    . Abdominal pain, right lower quadrant    . Unspecified constipation    . Arthritis    . Bipolar disorder    . MVP (mitral valve prolapse)    . Cancer      skin cancer   . 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 Stomach ulcer      as a child     Past Surgical History   Procedure Laterality Date   . Rotator cuff repair Right    . Tonsillectomy     . Hysterectomy     . Appendectomy     . Wisdom tooth extraction     . Abdominal exploration surgery       times two due to adhesions   . Colonoscopy  05/19/11     Dr Renato Gails   . Colonoscopy  07/15/2012     Dr. Zollie Beckers     History     Social History   . Marital Status: Married     Spouse Name: N/A     Number of Children: N/A   . Years of Education: N/A     Social History Main Topics   . Smoking status: Never  Smoker    . Smokeless tobacco: Never Used   . Alcohol Use: No   . Drug Use: No   . Sexually Active: None     Other Topics Concern   . None     Social History Narrative   . None     Current Outpatient Prescriptions   Medication Sig Dispense Refill   . dicyclomine (BENTYL) 20 MG tablet Take 20 mg by mouth every 6 hours.       . sertraline (ZOLOFT) 50 MG tablet Take 50 mg by mouth daily.       Marland Kitchen linaclotide (LINZESS) 290 MCG CAPS capsule Take 1 capsule by mouth every morning (before breakfast).  30 capsule  11   . Lactulose Encephalopathy (GENERLAC PO) Take  by mouth.       . QUEtiapine (SEROQUEL) 50 MG tablet Take 50 mg by mouth daily.       . traZODone (DESYREL) 150 MG tablet Take 300 mg by mouth nightly.       Marland Kitchen  buPROPion SR (WELLBUTRIN SR) 150 MG SR tablet Take 150 mg by mouth 2 times daily.       Marland Kitchen lamoTRIgine (LAMICTAL) 150 MG tablet Take 150 mg by mouth daily.         No current facility-administered medications for this visit.       Review of Systems   Constitutional: Positive for fatigue. Negative for fever, appetite change and unexpected weight change.   HENT: Negative for hearing loss and sore throat.    Eyes: Negative for photophobia and visual disturbance.   Respiratory: Negative for cough, choking and wheezing.    Cardiovascular: Negative for chest pain, palpitations and leg swelling.   Gastrointestinal: Positive for abdominal pain (chronic) and constipation. Negative for nausea, vomiting, diarrhea, blood in stool, abdominal distention, anal bleeding and rectal pain.   Genitourinary: Negative for dysuria and hematuria.   Musculoskeletal: Negative for myalgias, back pain and arthralgias.   Skin: Negative for rash.   Allergic/Immunologic: Negative for immunocompromised state.   Neurological: Negative for dizziness and seizures.   Hematological: Does not bruise/bleed easily.   Psychiatric/Behavioral: Negative for confusion and dysphoric mood. The patient is not nervous/anxious.         Bipolar          Objective:   Physical Exam   Nursing note and vitals reviewed.  Constitutional: She is oriented to person, place, and time. She appears well-developed and well-nourished.   HENT:   Head: Normocephalic and atraumatic.   Eyes: Conjunctivae and EOM are normal. Pupils are equal, round, and reactive to light.   Neck: Normal range of motion. Neck supple. No thyromegaly present.   Cardiovascular: Normal rate and regular rhythm.  Exam reveals no gallop and no friction rub.    Murmur (2/6 murmur) heard.  Pulmonary/Chest: Effort normal and breath sounds normal. No respiratory distress.   Abdominal: Soft. Bowel sounds are normal. She exhibits no distension. There is no tenderness. There is no rebound.   Musculoskeletal: Normal range of motion.   Neurological: She is alert and oriented to person, place, and time. No cranial nerve deficit.   Skin: Skin is warm and dry. No pallor.   Psychiatric: She has a normal mood and affect. Judgment normal.       Assessment:      1. Constipation  linaclotide (LINZESS) 290 MCG CAPS capsule   2. Personal history of colonic polyps              Plan:      1. Sample x1 box of Linzess 290mg  daily  Escribed Linzess 290 #30 with 11 refills  2. F/U 1 year refills  3. F/U 5 years colon recall, or sooner if needed

## 2012-07-24 NOTE — Patient Instructions (Signed)
Colon Polyps: After Your Visit  Your Care Instructions  Colon polyps are growths in the colon or the rectum. The cause of most colon polyps is not known, and most people who get them do not have any problems. But a certain kind can turn into cancer. For this reason, regular testing for colon polyps is important for people age 56 and older and anyone who has an increased risk for colon cancer.  Polyps are usually found through routine colon cancer screening tests. Although most colon polyps are not cancerous, they are usually removed and then tested for cancer. Screening for colon cancer saves lives because the cancer can usually be cured if it is caught early.  If you have a polyp that is the type that can turn into cancer, you may need more tests to examine your entire colon. The doctor will remove any other polyps that he or she finds, and you will be tested more often.  Follow-up care is a key part of your treatment and safety. Be sure to make and go to all appointments, and call your doctor if you are having problems. It's also a good idea to know your test results and keep a list of the medicines you take.  How can you care for yourself at home?  Regular exams to look for colon polyps are the best way to prevent polyps from turning into colon cancer. These can include a test for blood in the stool (fecal occult blood test), colonoscopy, and sigmoidoscopy. Talk with your doctor about a testing schedule that is right for you.  To prevent polyps  There is no home treatment for colon polyps. But you can take steps to prevent them from forming.   Get regular exercise and stay at a healthy body weight. Exercise can lower your chance of getting colon cancer. Get at least 30 minutes of exercise on most days of the week. Walking is a good choice. You also may want to do other activities, such as running, swimming, cycling, or playing tennis or team sports.   Limit alcohol to 2 drinks a day for men and 1 drink a day  for women. Too much alcohol can cause health problems.   Do not smoke. Smoking can raise your risk of getting colon polyps. If you need help quitting, talk to your doctor about stop-smoking programs and medicines. These can increase your chances of quitting for good.   Eat lots of fruits and vegetables, and limit animal fat in your diet.  When should you call for help?  Call your doctor now or seek immediate medical care if:   You have severe belly pain.   Your stools are maroon or very bloody.  Watch closely for changes in your health, and be sure to contact your doctor if:   You have a fever.   You have nausea or vomiting.   You have a change in bowel habits (new constipation or diarrhea).   Your symptoms get worse or are not improving as expected.   Where can you learn more?   Go to [email or web address] and sign in to your MyChart account. Enter C571 in the Search Health Information box to learn more about "Colon Polyps: After Your Visit."    If you do not have an account, please click on the "Sign Up Now" link.      2006-2014 [employer]. Care instructions adapted under license by [facility]. This care instruction is for use with your licensed healthcare professional.  If you have questions about a medical condition or this instruction, always ask your healthcare professional. [employer] Inc[employer] disclaims any warranty or liability for your use of this information.  Content Version: 10.0.273164; Last Revised: April 09, 2011

## 2012-08-02 ENCOUNTER — Inpatient Hospital Stay
Admit: 2012-08-02 | Disposition: A | Discharge status: Home / Self Care | Attending: Emergency Medicine | Admitting: Emergency Medicine

## 2012-08-02 LAB — URINALYSIS
Bilirubin Urine: NEGATIVE
Epithelial Cells, Wet Prep: 21.1 /HPF (ref 0–3)
Glucose, Ur: NEGATIVE MG/DL
Ketones, Urine: NEGATIVE MG/DL
Nitrite, Urine: NEGATIVE
Occult Blood,Urine: NEGATIVE
Protein, UA: NEGATIVE MG/DL
RBC, UA: 4.3 /HPF — ABNORMAL HIGH (ref 0–3)
Specific Gravity, Urine: 1.021 (ref 1.001–1.035)
Urobilinogen, Urine: 0.2 EU/DL
WBC, UA: 134.6 /HPF — ABNORMAL HIGH (ref 0–3)
pH, UA: 5.5

## 2012-08-02 LAB — ETHANOL
Alcohol Control: 39.7 MG/DL
Alcohol, Ethyl (B): <10 MG/DL

## 2012-08-02 LAB — MULTI DRUG SCREEN, URINE
Amphetamines: POSITIVE
Barbiturates: NEGATIVE
Benzodiazepines: NEGATIVE
Cannabinoids: NEGATIVE
Cocaine: NEGATIVE
Opiate Scrn, Interp: NEGATIVE

## 2012-08-02 LAB — COMPREHENSIVE PANEL
ALT: 22 U/L (ref 5–33)
AST: 11 U/L (ref 5–32)
Albumin: 4.8 G/DL (ref 3.5–5.2)
Alkaline Phosphatase: 64 U/L (ref 35–187)
Anion Gap: 14 mmol/L (ref 7–19)
BUN: 27 MG/DL — ABNORMAL HIGH (ref 5–20)
CO2: 20 mmol/L — ABNORMAL LOW (ref 22–29)
Calcium: 10.1 MG/DL (ref 8.8–10.2)
Chloride: 105 mmol/L (ref 98–111)
Creatinine: 1.3 MG/DL — ABNORMAL HIGH (ref 0.5–0.9)
Gfr Calculated: 45 mL/min — ABNORMAL LOW (ref 59–300)
Glucose: 93 MG/DL
Potassium: 3.8 mmol/L (ref 3.5–5.0)
Sodium: 139 mmol/L (ref 136–145)
Total Bilirubin: 0.3 MG/DL (ref 0.3–1.2)
Total Protein: 7.7 G/DL (ref 6.6–8.7)

## 2012-08-02 LAB — CBC PANEL ITEM
Basophils %: 0.6 % (ref 0–1)
Basophils: 0.06 10*3/uL (ref 0.0–0.20)
Eosinophils %: 0.16 10*3/uL (ref 0.0–0.60)
Eosinophils %: 1.6 % (ref 1–5)
Hematocrit: 43.8 % (ref 37–47)
Hemoglobin: 14.6 G/DL (ref 12.0–16.0)
Lymphocytes: 2.89 10*3/uL (ref 1.1–4.5)
Lymphocytes: 28.6 % (ref 20–40)
MCH: 28.5 PG (ref 27–31)
MCHC: 33.3 G/DL (ref 33–37)
MCV: 85.4 FL (ref 81–99)
MPV: 10.5 FL (ref 10.2–13.2)
Monocytes %: 6.2 % (ref 0–10)
Monocytes: 0.63 10*3/uL (ref 0.0–0.9)
Neutrophils %: 63 % (ref 50–70)
Neutrophils Absolute: 6.37 10*3/uL (ref 1.5–7.5)
Platelets: 246 10*3/uL (ref 130–400)
RBC: 5.13 MIL/uL (ref 4.2–5.4)
RDW: 13 % (ref 11.0–14.0)
WBC: 10.11 10*3/uL (ref 4.8–10.8)

## 2012-08-03 LAB — TSH: TSH: 1.14 u[IU]/mL (ref 0.27–4.20)

## 2012-08-03 LAB — VITAMIN D 25 HYDROXY: Vit D, 25-Hydroxy: 16.3 NG/ML — ABNORMAL LOW (ref 30–60)

## 2012-10-30 LAB — DRUG PROFILE
Amphetamine Screen, Ur: NEGATIVE ng/mL
Barbiturates: NEGATIVE ng/mL
Benzodiazepines: NEGATIVE ng/mL
Cannabinoids: NEGATIVE ng/mL
Cocaine (Metab.): NEGATIVE ng/mL
Creatinine, Ur: 75.1 mg/dL (ref 20.0–300.0)
Ethanol U, Quan: NEGATIVE %
Fentanyl, Ur: NEGATIVE pg/mL
MEPROBAMATE, REFLEX: NEGATIVE ng/mL
Methadone Screen, Urine: NEGATIVE ng/mL
Opiate Scrn, Ur: NEGATIVE ng/mL
Oxycodone, Urine Confirmation: NEGATIVE ng/mL
PHENCYCLIDINE, REFLEX: NEGATIVE ng/mL
Propoxyphene, Urine: NEGATIVE ng/mL
pH, Urine: 5.2 (ref 4.5–8.9)

## 2012-12-03 ENCOUNTER — Encounter

## 2012-12-03 MED ORDER — LINACLOTIDE 145 MCG PO CAPS
145 MCG | ORAL_CAPSULE | Freq: Every day | ORAL | Status: DC
Start: 2012-12-03 — End: 2013-12-30

## 2012-12-03 NOTE — Telephone Encounter (Signed)
I will escribe Linzess daily

## 2012-12-31 NOTE — Telephone Encounter (Signed)
12/31/2012 Pt's husband stated that when they went to pick up her Linzess they were told insurance didn't cover it. We are going to start a PA for the medication and I gave her som samples of Linzess 145 mg to hold her until the PA goes through.

## 2013-02-21 LAB — COMPREHENSIVE PANEL
ALT: 23 U/L (ref 5–33)
AST: 13 U/L (ref 5–32)
Albumin: 4.4 G/DL (ref 3.5–5.2)
Alkaline Phosphatase: 65 U/L (ref 35–104)
Anion Gap: 13 mmol/L (ref 7–19)
BUN: 20 MG/DL (ref 6–20)
CO2: 23 mmol/L (ref 22–29)
Calcium: 9.6 MG/DL (ref 8.6–10.0)
Chloride: 104 mmol/L (ref 98–111)
Creatinine: 1.5 MG/DL — ABNORMAL HIGH (ref 0.50–0.90)
Gfr Calculated: 38 mL/min — ABNORMAL LOW (ref 59–300)
Glucose: 83 MG/DL (ref 74–109)
Potassium: 4.4 mmol/L (ref 3.5–5.0)
Sodium: 140 mmol/L (ref 136–145)
Total Bilirubin: 0.3 MG/DL (ref 0.3–1.2)
Total Protein: 7.1 G/DL (ref 6.6–8.7)

## 2013-02-21 LAB — CBC PANEL ITEM
Basophils %: 0.6 % (ref 0–1)
Basophils: 0.05 10*3/uL (ref 0.0–0.20)
Eosinophils %: 0.67 10*3/uL — ABNORMAL HIGH (ref 0.0–0.60)
Eosinophils %: 8 % — ABNORMAL HIGH (ref 1–5)
Hematocrit: 40.3 % (ref 37–47)
Hemoglobin: 13.4 G/DL (ref 12.0–16.0)
Lymphocytes: 2.86 10*3/uL (ref 1.1–4.5)
Lymphocytes: 34.3 % (ref 20–40)
MCH: 29.1 PG (ref 27–31)
MCHC: 33.3 G/DL (ref 33–37)
MCV: 87.6 FL (ref 81–99)
MPV: 10.6 FL (ref 10.2–13.2)
Monocytes %: 7.3 % (ref 0–10)
Monocytes: 0.61 10*3/uL (ref 0.0–0.9)
Neutrophils %: 49.8 % — ABNORMAL LOW (ref 50–70)
Neutrophils Absolute: 4.15 10*3/uL (ref 1.5–7.5)
Platelets: 233 10*3/uL (ref 130–400)
RBC: 4.6 MIL/uL (ref 4.2–5.4)
RDW: 12.9 % (ref 11.0–14.0)
WBC: 8.34 10*3/uL (ref 4.8–10.8)

## 2013-02-21 LAB — LIPASE: Lipase: 80 U/L — ABNORMAL HIGH (ref 13–60)

## 2013-02-21 LAB — AMYLASE: Amylase: 108 U/L — ABNORMAL HIGH (ref 28–100)

## 2013-04-22 LAB — COMPREHENSIVE PANEL
ALT: 23 U/L (ref 5–33)
AST: 11 U/L (ref 5–32)
Albumin: 4.5 G/DL (ref 3.5–5.2)
Alkaline Phosphatase: 65 U/L (ref 35–104)
Anion Gap: 12 mmol/L (ref 7–19)
BUN: 20 MG/DL (ref 6–20)
CO2: 24 mmol/L (ref 22–29)
Calcium: 9.7 MG/DL (ref 8.6–10.0)
Chloride: 104 mmol/L (ref 98–111)
Creatinine: 1.3 MG/DL — ABNORMAL HIGH (ref 0.50–0.90)
Gfr Calculated: 45 mL/min — ABNORMAL LOW (ref 59–300)
Glucose: 99 MG/DL (ref 74–109)
Potassium: 4.4 mmol/L (ref 3.5–5.0)
Sodium: 140 mmol/L (ref 136–145)
Total Bilirubin: 0.2 MG/DL — ABNORMAL LOW (ref 0.3–1.2)
Total Protein: 7.2 G/DL (ref 6.6–8.7)

## 2013-04-22 LAB — CBC PANEL ITEM
Basophils %: 1 % (ref 0–1)
Basophils: 0.09 10*3/uL (ref 0.0–0.20)
Eosinophils %: 0.43 10*3/uL (ref 0.0–0.60)
Eosinophils %: 4.8 % (ref 1–5)
Hematocrit: 43.4 % (ref 37–47)
Hemoglobin: 14.3 G/DL (ref 12.0–16.0)
Lymphocytes: 2.43 10*3/uL (ref 1.1–4.5)
Lymphocytes: 27.2 % (ref 20–40)
MCH: 29.3 PG (ref 27–31)
MCHC: 32.9 G/DL — ABNORMAL LOW (ref 33–37)
MCV: 88.9 FL (ref 81–99)
MPV: 11.2 FL (ref 10.2–13.2)
Monocytes %: 6.7 % (ref 0–10)
Monocytes: 0.6 10*3/uL (ref 0.0–0.9)
Neutrophils %: 60.3 % (ref 50–70)
Neutrophils Absolute: 5.37 10*3/uL (ref 1.5–7.5)
Platelets: 219 10*3/uL (ref 130–400)
RBC: 4.88 MIL/uL (ref 4.2–5.4)
RDW: 13.2 % (ref 11.0–14.0)
WBC: 8.92 10*3/uL (ref 4.8–10.8)

## 2013-04-22 LAB — URINALYSIS
Bilirubin Urine: NEGATIVE
Epithelial Cells, Wet Prep: 1 /HPF (ref 0–3)
Glucose, Ur: NEGATIVE MG/DL
Hyaline Casts, UA: 4 /LPF (ref 0–3)
Ketones, Urine: NEGATIVE MG/DL
Nitrite, Urine: NEGATIVE
Occult Blood,Urine: NEGATIVE
Protein, UA: NEGATIVE MG/DL
RBC, UA: 2 /HPF (ref 0–3)
Specific Gravity, Urine: 1.015 (ref 1.001–1.035)
Urobilinogen, Urine: 0.2 EU/DL
WBC, UA: 88 /HPF — ABNORMAL HIGH (ref 0–3)
pH, UA: 5.5

## 2013-04-22 LAB — LIPASE: Lipase: 88 U/L — ABNORMAL HIGH (ref 13–60)

## 2013-04-22 LAB — AMYLASE: Amylase: 125 U/L — ABNORMAL HIGH (ref 28–100)

## 2013-06-15 LAB — CBC PANEL ITEM
Basophils %: 0.6 % (ref 0–1)
Basophils: 0.06 10*3/uL (ref 0.0–0.20)
Eosinophils %: 0.52 10*3/uL (ref 0.0–0.60)
Eosinophils %: 5.1 % — ABNORMAL HIGH (ref 1–5)
Hematocrit: 43.8 % (ref 37–47)
Hemoglobin: 14.7 G/DL (ref 12.0–16.0)
Lymphocytes: 2.92 10*3/uL (ref 1.1–4.5)
Lymphocytes: 28.5 % (ref 20–40)
MCH: 29.1 PG (ref 27–31)
MCHC: 33.6 G/DL (ref 33–37)
MCV: 86.6 FL (ref 81–99)
MPV: 10.2 FL (ref 10.2–13.2)
Monocytes %: 7.6 % (ref 0–10)
Monocytes: 0.78 10*3/uL (ref 0.0–0.9)
Neutrophils %: 58.2 % (ref 50–70)
Neutrophils Absolute: 5.96 10*3/uL (ref 1.5–7.5)
Platelets: 206 10*3/uL (ref 130–400)
RBC: 5.06 MIL/uL (ref 4.2–5.4)
RDW: 13.5 % (ref 11.0–14.0)
WBC: 10.24 10*3/uL (ref 4.8–10.8)

## 2013-06-15 LAB — URINALYSIS
Bilirubin Urine: NEGATIVE
Glucose, Ur: NEGATIVE MG/DL
Ketones, Urine: NEGATIVE MG/DL
Nitrite, Urine: NEGATIVE
Occult Blood,Urine: NEGATIVE
Protein, UA: NEGATIVE MG/DL
Specific Gravity, Urine: 1.02 (ref 1.001–1.035)
Urobilinogen, Urine: 0.2 EU/DL
pH, UA: 6

## 2013-06-15 LAB — MULTI DRUG SCREEN, URINE
Amphetamines: POSITIVE — ABNORMAL HIGH
Barbiturates: NEGATIVE
Benzodiazepines: NEGATIVE
Cannabinoids: NEGATIVE
Cocaine: NEGATIVE
Opiate Scrn, Interp: NEGATIVE

## 2013-06-16 ENCOUNTER — Inpatient Hospital Stay
Admit: 2013-06-16 | Disposition: A | Discharge status: Home / Self Care | Attending: Emergency Medicine | Admitting: Emergency Medicine

## 2013-06-16 LAB — COMPREHENSIVE PANEL
ALT: 55 U/L — ABNORMAL HIGH (ref 5–33)
AST: 31 U/L (ref 5–32)
Albumin: 4.3 G/DL (ref 3.5–5.2)
Alkaline Phosphatase: 74 U/L (ref 35–104)
Anion Gap: 15 mmol/L (ref 7–19)
BUN: 19 MG/DL (ref 6–20)
CO2: 20 mmol/L — ABNORMAL LOW (ref 22–29)
Calcium: 10.3 MG/DL — ABNORMAL HIGH (ref 8.6–10.0)
Chloride: 106 mmol/L (ref 98–111)
Creatinine: 1.3 MG/DL — ABNORMAL HIGH (ref 0.50–0.90)
Gfr Calculated: 45 mL/min — ABNORMAL LOW (ref 59–300)
Glucose: 137 MG/DL — ABNORMAL HIGH (ref 74–109)
Potassium: 4.5 mmol/L (ref 3.5–5.0)
Sodium: 140 mmol/L (ref 136–145)
Total Bilirubin: 0.2 MG/DL — ABNORMAL LOW (ref 0.3–1.2)
Total Protein: 7.6 G/DL (ref 6.6–8.7)

## 2013-06-16 LAB — ETHANOL
Alcohol Control: 38.5 MG/DL
Alcohol, Ethyl (B): <10 MG/DL

## 2013-06-16 LAB — VITAMIN D 25 HYDROXY: Vit D, 25-Hydroxy: 18.3 NG/ML — ABNORMAL LOW (ref 30–60)

## 2013-06-16 LAB — TSH: TSH: 2.44 u[IU]/mL (ref 0.27–4.20)

## 2013-06-16 LAB — PTH, INTACT: PTH: 83.8 PG/ML — ABNORMAL HIGH (ref 15–65)

## 2013-06-21 LAB — COMPREHENSIVE PANEL
ALT: 44 U/L — ABNORMAL HIGH (ref 5–33)
AST: 20 U/L (ref 5–32)
Albumin: 3.5 G/DL (ref 3.5–5.2)
Alkaline Phosphatase: 65 U/L (ref 35–104)
Anion Gap: 13 mmol/L (ref 7–19)
BUN: 15 MG/DL (ref 6–20)
CO2: 25 mmol/L (ref 22–29)
Calcium: 9.4 MG/DL (ref 8.6–10.0)
Chloride: 106 mmol/L (ref 98–111)
Creatinine: 1.1 MG/DL — ABNORMAL HIGH (ref 0.50–0.90)
Gfr Calculated: 54 mL/min — ABNORMAL LOW (ref 59–300)
Glucose: 125 MG/DL — ABNORMAL HIGH (ref 74–109)
Potassium: 4.3 mmol/L (ref 3.5–5.0)
Sodium: 143 mmol/L (ref 136–145)
Total Bilirubin: 0.2 MG/DL — ABNORMAL LOW (ref 0.3–1.2)
Total Protein: 5.9 G/DL — ABNORMAL LOW (ref 6.6–8.7)

## 2013-07-06 ENCOUNTER — Inpatient Hospital Stay
Admit: 2013-07-06 | Disposition: A | Discharge status: Home / Self Care | Attending: Emergency Medicine | Admitting: Emergency Medicine

## 2013-07-06 LAB — CBC PANEL ITEM
Basophils %: 0.6 % (ref 0–1)
Basophils: 0.05 10*3/uL (ref 0.0–0.20)
Eosinophils %: 0.51 10*3/uL (ref 0.0–0.60)
Eosinophils %: 5.6 % — ABNORMAL HIGH (ref 1–5)
Hematocrit: 42.2 % (ref 37–47)
Hemoglobin: 14.2 G/DL (ref 12.0–16.0)
Lymphocytes: 2.93 10*3/uL (ref 1.1–4.5)
Lymphocytes: 32.4 % (ref 20–40)
MCH: 29.1 PG (ref 27–31)
MCHC: 33.6 G/DL (ref 33–37)
MCV: 86.5 FL (ref 81–99)
MPV: 10.8 FL (ref 10.2–13.2)
Monocytes %: 7.1 % (ref 0–10)
Monocytes: 0.64 10*3/uL (ref 0.0–0.9)
Neutrophils %: 54.3 % (ref 50–70)
Neutrophils Absolute: 4.9 10*3/uL (ref 1.5–7.5)
Platelets: 211 10*3/uL (ref 130–400)
RBC: 4.88 MIL/uL (ref 4.2–5.4)
RDW: 13.2 % (ref 11.0–14.0)
WBC: 9.03 10*3/uL (ref 4.8–10.8)

## 2013-07-06 LAB — ETHANOL
Alcohol Control: 40 MG/DL
Alcohol, Ethyl (B): <10 MG/DL

## 2013-07-06 LAB — COMPREHENSIVE PANEL
ALT: 39 U/L — ABNORMAL HIGH (ref 5–33)
AST: 20 U/L (ref 5–32)
Albumin: 4.4 G/DL (ref 3.5–5.2)
Alkaline Phosphatase: 79 U/L (ref 35–104)
Anion Gap: 13 mmol/L (ref 7–19)
BUN: 19 MG/DL (ref 6–20)
CO2: 23 mmol/L (ref 22–29)
Calcium: 9.7 MG/DL (ref 8.6–10.0)
Chloride: 106 mmol/L (ref 98–111)
Creatinine: 1.4 MG/DL — ABNORMAL HIGH (ref 0.50–0.90)
Gfr Calculated: 41 mL/min — ABNORMAL LOW (ref 59–300)
Glucose: 185 MG/DL — ABNORMAL HIGH (ref 74–109)
Potassium: 4.2 mmol/L (ref 3.5–5.0)
Sodium: 142 mmol/L (ref 136–145)
Total Bilirubin: 0.2 MG/DL — ABNORMAL LOW (ref 0.3–1.2)
Total Protein: 6.9 G/DL (ref 6.6–8.7)

## 2013-07-06 LAB — URINALYSIS
Bacteria, UA: NEGATIVE /HPF
Bilirubin Urine: NEGATIVE
Epithelial Cells, Wet Prep: 2 /HPF (ref 0–3)
Glucose, Ur: NEGATIVE MG/DL
Hyaline Casts, UA: 4 /LPF (ref 0–3)
Ketones, Urine: NEGATIVE MG/DL
Nitrite, Urine: NEGATIVE
Occult Blood,Urine: NEGATIVE
Protein, UA: NEGATIVE MG/DL
RBC, UA: 4 /HPF — ABNORMAL HIGH (ref 0–3)
Specific Gravity, Urine: 1.021 (ref 1.001–1.035)
Urobilinogen, Urine: 1 EU/DL
WBC, UA: 72 /HPF — ABNORMAL HIGH (ref 0–3)
pH, UA: 7.5

## 2013-07-06 LAB — HCGUR PROFILE: HCG(Urine) Pregnancy Test: NEGATIVE

## 2013-07-06 LAB — MULTI DRUG SCREEN, URINE
Amphetamines: NEGATIVE
Barbiturates: NEGATIVE
Benzodiazepines: NEGATIVE
Cannabinoids: NEGATIVE
Cocaine: NEGATIVE
Opiate Scrn, Interp: NEGATIVE

## 2013-07-06 LAB — SALICYLATE LEVEL: Salicylate Lvl: <3 MG/DL — ABNORMAL LOW (ref 3–10)

## 2013-07-06 LAB — ACETAMINOPHEN LEVEL: Acetaminophen Level: <15 UG/ML — ABNORMAL LOW (ref 15–30)

## 2013-08-13 LAB — DRUG PROFILE
Amphetamine Screen, Ur: NEGATIVE ng/mL
Barbiturates: NEGATIVE ng/mL
Benzodiazepines: NEGATIVE ng/mL
Cannabinoids: NEGATIVE ng/mL
Cocaine (Metab.): NEGATIVE ng/mL
Creatinine, Ur: 114.8 mg/dL (ref 20.0–300.0)
Ethanol U, Quan: NEGATIVE %
Fentanyl, Ur: NEGATIVE pg/mL
MEPROBAMATE, REFLEX: NEGATIVE ng/mL
Methadone Screen, Urine: NEGATIVE ng/mL
Opiate Scrn, Ur: NEGATIVE ng/mL
Oxycodone, Urine Confirmation: NEGATIVE ng/mL
PHENCYCLIDINE, REFLEX: NEGATIVE ng/mL
Propoxyphene, Urine: NEGATIVE ng/mL
pH, Urine: 5.6 (ref 4.5–8.9)

## 2013-11-30 LAB — CBC PANEL ITEM
Basophils %: 1 % (ref 0–1)
Basophils: 0.07 10*3/uL (ref 0.0–0.20)
Eosinophils %: 0.44 10*3/uL (ref 0.0–0.60)
Eosinophils %: 6.4 % — ABNORMAL HIGH (ref 1–5)
Hematocrit: 41.3 % (ref 37–47)
Hemoglobin: 14.3 G/DL (ref 12.0–16.0)
Lymphocytes: 2.44 10*3/uL (ref 1.1–4.5)
Lymphocytes: 35.3 % (ref 20–40)
MCH: 29.1 PG (ref 27–31)
MCHC: 34.6 G/DL (ref 33–37)
MCV: 84.1 FL (ref 81–99)
MPV: 10.7 FL (ref 10.2–13.2)
Monocytes %: 7.7 % (ref 0–10)
Monocytes: 0.53 10*3/uL (ref 0.0–0.9)
Neutrophils %: 49.6 % — ABNORMAL LOW (ref 50–70)
Neutrophils Absolute: 3.44 10*3/uL (ref 1.5–7.5)
Platelets: 215 10*3/uL (ref 130–400)
RBC: 4.91 MIL/uL (ref 4.2–5.4)
RDW: 13.1 % (ref 11.0–14.0)
WBC: 6.92 10*3/uL (ref 4.8–10.8)

## 2013-11-30 LAB — URINALYSIS
Bacteria, UA: NEGATIVE /HPF
Bilirubin Urine: NEGATIVE
Epithelial Cells, Wet Prep: 3 /HPF (ref 0–3)
Glucose, Ur: NEGATIVE MG/DL
Hyaline Casts, UA: 2 /LPF (ref 0–3)
Ketones, Urine: NEGATIVE MG/DL
Nitrite, Urine: NEGATIVE
Occult Blood,Urine: NEGATIVE
Protein, UA: NEGATIVE MG/DL
RBC, UA: 1 /HPF (ref 0–3)
Specific Gravity, Urine: 1.018 (ref 1.001–1.035)
Urobilinogen, Urine: 0.2 EU/DL
WBC, UA: 2 /HPF (ref 0–3)
pH, UA: 5.5

## 2013-11-30 LAB — MULTI DRUG SCREEN, URINE
Amphetamines: POSITIVE — ABNORMAL HIGH
Barbiturates: NEGATIVE
Benzodiazepines: NEGATIVE
Cannabinoids: NEGATIVE
Cocaine: NEGATIVE
Opiate Scrn, Interp: POSITIVE — ABNORMAL HIGH

## 2013-11-30 LAB — COMPREHENSIVE PANEL
ALT: 44 U/L — ABNORMAL HIGH (ref 5–33)
AST: 25 U/L (ref 5–32)
Albumin: 4.5 G/DL (ref 3.5–5.2)
Alkaline Phosphatase: 86 U/L (ref 35–104)
Anion Gap: 13 mmol/L (ref 7–19)
BUN: 18 MG/DL (ref 6–20)
CO2: 22 mmol/L (ref 22–29)
Calcium: 10.2 MG/DL — ABNORMAL HIGH (ref 8.6–10.0)
Chloride: 106 mmol/L (ref 98–111)
Creatinine: 1.2 MG/DL — ABNORMAL HIGH (ref 0.50–0.90)
Gfr Calculated: 49 mL/min — ABNORMAL LOW (ref 59–300)
Glucose: 119 MG/DL — ABNORMAL HIGH (ref 74–109)
Potassium: 4.2 mmol/L (ref 3.5–5.0)
Sodium: 141 mmol/L (ref 136–145)
Total Bilirubin: 0.5 MG/DL (ref 0.3–1.2)
Total Protein: 7.7 G/DL (ref 6.6–8.7)

## 2013-11-30 LAB — ETHANOL
Alcohol Control: 42.4 MG/DL
Alcohol, Ethyl (B): 11.4 MG/DL

## 2013-12-01 LAB — TSH: TSH: 1.04 u[IU]/mL (ref 0.27–4.20)

## 2013-12-01 LAB — VITAMIN D 25 HYDROXY: Vit D, 25-Hydroxy: 22.2 NG/ML — ABNORMAL LOW (ref 30–60)

## 2013-12-05 LAB — SODIUM: Sodium: 138 mmol/L (ref 136–145)

## 2013-12-06 LAB — HEPATITIS DIAGNOSTIC ALGORITHM
HBSAG W REFLEX: NEGATIVE
Hep A Total Ab: NEGATIVE
Hep B Core Ab, IgM: NEGATIVE
Hepatitis C IgG: NEGATIVE
Index: <0.02 IV

## 2013-12-27 NOTE — Telephone Encounter (Signed)
Last seen 07-24-12 [facility] aprn. Patient 's husband called today said his wife needs refill on Linzess, I told him it has been year and a half since she was last seen and she will need a follow up with aprn for medication refills and he voiced understanding, I transferred the call to pre-service. Dh cma

## 2013-12-30 ENCOUNTER — Ambulatory Visit
Admit: 2013-12-30 | Discharge: 2013-12-30 | Payer: PRIVATE HEALTH INSURANCE | Attending: Family | Primary: Emergency Medicine

## 2013-12-30 DIAGNOSIS — K5909 Other constipation: Secondary | ICD-10-CM

## 2013-12-30 MED ORDER — LINACLOTIDE 290 MCG PO CAPS
290 MCG | ORAL_CAPSULE | Freq: Every day | ORAL | Status: DC
Start: 2013-12-30 — End: 2014-06-02

## 2013-12-30 NOTE — Progress Notes (Signed)
Subjective:      Patient ID: Joyce Cohen is a 58 y.o. female.  Chief Complaint   Patient presents with   . Medication Refill     Linzess 145 mcg       HPI  PCP: Calton Dach  Pt is here for medication refills. Was started on Linzess daily for chronic constipation at last OV 1 year ago. Is also taking miralax BID. Pt states she was having loose stools so the Linzess was decreased to daily. States she has recently been started on ultram for chronic back pain and is having worsening of constipation. States on occasion she will double up on the Linzess and this works great. She wants to get back on the Linzess daily.  No further complaints.  She does have chronic abdominal pain, secondary to adhesions, but this is not new. Sees pain management for this.      Last colonoscopy 07/15/2012 ([facility])-polyp; 5 year recall  (1) POLYP, SIGMOID COLON:  ---FRAGMENT OF BENIGN COLONIC MUCOSA DEMONSTRATING SLIGHT GLANDULAR  HYPERPLASTIC CHANGES AND CAUTERY ARTIFACT.    CT abd/pelvis 05/25/2012-with no acute abnormality     Family HX:  Pt denies family hx of colon polyps, colon CA, inflammatory bowel dx, gastric CA and esophageal CA.    Past Medical History   Diagnosis Date   . Pleurisy without mention of effusion or current tuberculosis    . Unspecified asthma(493.90)    . Unspecified hemorrhoids without mention of complication    . Abdominal pain, right lower quadrant    . Unspecified constipation    . Arthritis    . Bipolar disorder (HCC)    . MVP (mitral valve prolapse)    . Cancer (HCC)      skin cancer   . 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 Stomach ulcer      as a child     Past Surgical History   Procedure Laterality Date   . Rotator cuff repair Right    . Tonsillectomy     . Hysterectomy     . Appendectomy     . Wisdom tooth extraction     . Abdominal exploration surgery       times two due to adhesions   . Colonoscopy  05/19/11     Dr Renato Gails   . Colonoscopy  07/15/2012     Dr. Zollie Beckers      History     Social History   . Marital Status: Married     Spouse Name: N/A     Number of Children: N/A   . Years of Education: N/A     Social History Main Topics   . Smoking status: Never Smoker    . Smokeless tobacco: Never Used   . Alcohol Use: No   . Drug Use: No   . Sexual Activity: None     Other Topics Concern   . None     Social History Narrative       Allergies   Allergen Reactions   . Decongestant [Pseudoephedrine Hcl]    . Motrin [Ibuprofen]        Current Outpatient Prescriptions   Medication Sig Dispense Refill   . traMADol (ULTRAM) 50 MG tablet Take 50 mg by mouth every 6 hours as needed for Pain     . Cholecalciferol (VITAMIN D3) 50000 UNITS CAPS Take by mouth     . OXcarbazepine (TRILEPTAL) 150 MG  tablet Take 150 mg by mouth 2 times daily     . famotidine (PEPCID) 20 MG tablet Take 20 mg by mouth 2 times daily     . Multiple Vitamins-Minerals (THERAPEUTIC MULTIVITAMIN-MINERALS) tablet Take 1 tablet by mouth daily     . polyethylene glycol (GLYCOLAX) powder Take 17 g by mouth daily     . linaclotide (LINZESS) 145 MCG capsule Take 1 capsule by mouth every morning (before breakfast). 30 capsule 11   . QUEtiapine (SEROQUEL) 50 MG tablet Take 50 mg by mouth daily.     . traZODone (DESYREL) 150 MG tablet Take 300 mg by mouth nightly.     Marland Kitchen buPROPion SR (WELLBUTRIN SR) 150 MG SR tablet Take 150 mg by mouth 2 times daily.       No current facility-administered medications for this visit.       Review of Systems   Constitutional: Negative for fever, appetite change, fatigue and unexpected weight change.   HENT: Negative for hearing loss and sore throat.    Eyes: Negative for photophobia and visual disturbance.   Respiratory: Positive for cough. Negative for choking and wheezing.    Cardiovascular: Negative for chest pain, palpitations and leg swelling.   Gastrointestinal: Positive for abdominal pain (chronic) and constipation (chronic). Negative for nausea, vomiting, diarrhea, blood in stool, abdominal  distention, anal bleeding and rectal pain.   Genitourinary: Negative for dysuria and hematuria.   Musculoskeletal: Negative for myalgias, back pain and arthralgias.   Skin: Negative for rash.   Allergic/Immunologic: Negative for immunocompromised state.   Neurological: Negative for dizziness and seizures.   Hematological: Does not bruise/bleed easily.   Psychiatric/Behavioral: Negative for confusion and dysphoric mood. The patient is not nervous/anxious.         Bipolar         Objective:   Physical Exam   Constitutional: She is oriented to person, place, and time. She appears well-developed and well-nourished.   BP 118/82 mmHg  Pulse 94  Ht 5\' 5"  (1.651 m)  Wt 209 lb 4.8 oz (94.938 kg)  BMI 34.83 kg/m2     HENT:   Head: Normocephalic and atraumatic.   Eyes: Conjunctivae and EOM are normal. Pupils are equal, round, and reactive to light.   Neck: Normal range of motion. Neck supple. No thyromegaly present.   Cardiovascular: Normal rate, regular rhythm and normal heart sounds.  Exam reveals no gallop and no friction rub.    No murmur heard.  Pulmonary/Chest: Effort normal and breath sounds normal. No respiratory distress.   Abdominal: Soft. Bowel sounds are normal. She exhibits no distension. There is no tenderness. There is no rebound.   Musculoskeletal: Normal range of motion.   Neurological: She is alert and oriented to person, place, and time. No cranial nerve deficit.   Skin: Skin is warm and dry. No pallor.   Psychiatric: Judgment normal. Her mood appears anxious.   Nursing note and vitals reviewed.      Assessment:      1. Chronic constipation  linaclotide (LINZESS) 290 MCG CAPS capsule            Plan:      1. Stop linzess daily  2. Escribed Linzess daily #30 with 11 refills  3. Samples x 7 boxes provided to pt  4. F/u in 1 year for med refills, or sooner if needed

## 2013-12-30 NOTE — Patient Instructions (Signed)
Constipation: Care Instructions  Your Care Instructions  Constipation means that you have a hard time passing stools (bowel movements). People pass stools from 3 times a day to once every 3 days. What is normal for you may be different. Constipation may occur with pain in the rectum and cramping. The pain may get worse when you try to pass stools. Sometimes there are small amounts of bright red blood on toilet paper or the surface of stools. This is because of enlarged veins near the rectum (hemorrhoids).  A few changes in your diet and lifestyle may help you avoid ongoing constipation. Your doctor may also prescribe medicine to help loosen your stool.  Some medicines can cause constipation. These include pain medicines and antidepressants. Tell your doctor about all the medicines you take. Your doctor may want to make a medicine change to ease your symptoms.  Follow-up care is a key part of your treatment and safety. Be sure to make and go to all appointments, and call your doctor if you are having problems. It's also a good idea to know your test results and keep a list of the medicines you take.  How can you care for yourself at home?  ?? Drink plenty of fluids, enough so that your urine is light yellow or clear like water. If you have kidney, heart, or liver disease and have to limit fluids, talk with your doctor before you increase the amount of fluids you drink.  ?? Include high-fiber foods in your diet each day. These include fruits, vegetables, beans, and whole grains.  ?? Get at least 30 minutes of exercise on most days of the week. Walking is a good choice. You also may want to do other activities, such as running, swimming, cycling, or playing tennis or team sports.  ?? Take a fiber supplement, such as Citrucel or Metamucil, every day. Read and follow all instructions on the label.  ?? Schedule time each day for a bowel movement. A daily routine may help. Take your time having your bowel movement.  ?? Support  your feet with a small step stool when you sit on the toilet. This helps flex your hips and places your pelvis in a squatting position.  ?? Your doctor may recommend an over-the-counter laxative to relieve your constipation. Examples are Milk of Magnesia and MiraLax. Read and follow all instructions on the label. Do not use laxatives on a long-term basis.  When should you call for help?  Call your doctor now or seek immediate medical care if:  ?? You have new or worse belly pain.  ?? You have new or worse nausea or vomiting.  ?? You have blood in your stools.  Watch closely for changes in your health, and be sure to contact your doctor if:  ?? Your constipation is getting worse.  ?? You do not get better as expected.   Where can you learn more?   Go to [email or web address] and sign in to your MyChart account. Enter P343 in the Search Health Information box to learn more about ???Constipation: Care Instructions.???    If you do not have an account, please click on the ???Sign Up Now??? link.     ?? 2006-2015 [employer] Inc[employer]. Care instructions adapted under license by [facility]. This care instruction is for use with your licensed healthcare professional. If you have questions about a medical condition or this instruction, always ask your healthcare professional. Healthwise, Incorporated disclaims any warranty or liability for your use of   this information.  Content Version: 10.6.465758; Current as of: January 01, 2013

## 2014-02-02 ENCOUNTER — Inpatient Hospital Stay
Admission: EM | Admit: 2014-02-02 | Discharge: 2014-02-07 | Disposition: A | Discharge status: Home / Self Care | Payer: PRIVATE HEALTH INSURANCE | Admitting: Psychiatry

## 2014-02-02 DIAGNOSIS — F339 Major depressive disorder, recurrent, unspecified: Secondary | ICD-10-CM

## 2014-02-02 LAB — URINALYSIS
Bilirubin Urine: NEGATIVE
Blood, Urine: NEGATIVE
Glucose, Ur: >=1000 mg/dL — ABNORMAL HIGH
Leukocyte Esterase, Urine: NEGATIVE
Nitrite, Urine: NEGATIVE
Specific Gravity, UA: 1.033 (ref 1.005–1.030)
Urobilinogen, Urine: 0.2 E.U./dL (ref <2.0)
pH, UA: 5.5 (ref 5.0–8.0)

## 2014-02-02 LAB — COMPREHENSIVE METABOLIC PANEL
ALT: 76 U/L — ABNORMAL HIGH (ref 5–33)
AST: 36 U/L — ABNORMAL HIGH (ref 5–32)
Albumin: 4.9 g/dL (ref 3.5–5.2)
Alkaline Phosphatase: 120 U/L — ABNORMAL HIGH (ref 35–104)
Anion Gap: 18 mmol/L (ref 7–19)
BUN: 18 mg/dL (ref 6–20)
CO2: 21 mmol/L — ABNORMAL LOW (ref 22–29)
Calcium: 9.9 mg/dL (ref 8.6–10.0)
Chloride: 101 mmol/L (ref 98–111)
Creatinine: 1.1 mg/dL — ABNORMAL HIGH (ref 0.6–0.9)
GFR Non-African American: 51 — AB (ref >60)
Globulin: 2.4 g/dL
Glucose: 317 mg/dL — ABNORMAL HIGH (ref 74–109)
Potassium: 4.5 mmol/L (ref 3.5–5.1)
Sodium: 140 mmol/L (ref 136–145)
Total Bilirubin: 0.5 mg/dL (ref 0.2–1.2)
Total Protein: 7.3 g/dL (ref 6.7–8.7)

## 2014-02-02 LAB — CBC
Hematocrit: 45.3 % (ref 37.0–47.0)
Hemoglobin: 16 g/dL (ref 12.0–16.0)
MCH: 28.9 pg (ref 27.0–31.0)
MCHC: 35.3 g/dL (ref 33.0–37.0)
MCV: 81.8 fL (ref 81.0–99.0)
MPV: 11.1 fL — ABNORMAL HIGH (ref 7.4–10.4)
Platelets: 202 10*3/uL (ref 130–400)
RBC: 5.54 M/uL — ABNORMAL HIGH (ref 4.20–5.40)
RDW: 12.6 % (ref 11.5–14.5)
WBC: 8.8 10*3/uL (ref 4.8–10.8)

## 2014-02-02 LAB — URINE DRUG SCREEN
Amphetamine Screen, Urine: POSITIVE (ref <1000)
Barbiturate Screen, Ur: NEGATIVE (ref <200)
Benzodiazepine Screen, Urine: NEGATIVE (ref <100)
Cannabinoid Scrn, Ur: NEGATIVE (ref <50)
Cocaine Metabolite Screen, Urine: NEGATIVE (ref <300)
Opiate Scrn, Ur: NEGATIVE (ref <300)

## 2014-02-02 LAB — ETHANOL: Ethanol Lvl: <10 mg/dL

## 2014-02-02 NOTE — ED Provider Notes (Addendum)
02/02/2014    Chief Complaint:   Chief Complaint   Patient presents with   ??? Psychiatric Evaluation       HPI: Joyce Cohen is a 58 y.o. female who presents to the emergency department for evaluation of depression and suicidal ideations.     Symptoms started several days ago. Timing of symptoms was gradual onset symptoms of been intermittent but are worsening. Symptoms described as severe. No exacerbating or relieving factors.    Patient has past history of psychiatric admission at this facility.    States she is suicidal but does not have a certain plan. She has had a butcher knife out making small cuts to her forearm. Denies any homicidal ideations. Denies any visual or auditory hallucinations. Denies drug or alcohol use.    States she has been taking her medications as prescribed.    Denies any fevers/chills, chest pain, shortness of breath, abdominal pain, nausea/vomiting, rash.   Tetanus shot is up-to-date.    ROS: 10 system review of systems is negative except per HPI.    PMH:   Past Medical History   Diagnosis Date   ??? Pleurisy without mention of effusion or current tuberculosis    ??? Unspecified asthma(493.90)    ??? Unspecified hemorrhoids without mention of complication    ??? Abdominal pain, right lower quadrant    ??? Unspecified constipation    ??? Arthritis    ??? Bipolar disorder (HCC)    ??? MVP (mitral valve prolapse)    ??? Cancer (HCC)      skin cancer   ??? 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 Stomach ulcer      as a child       PSH:   Past Surgical History   Procedure Laterality Date   ??? Rotator cuff repair Right    ??? Tonsillectomy     ??? Hysterectomy     ??? Appendectomy     ??? Wisdom tooth extraction     ??? Abdominal exploration surgery       times two due to adhesions   ??? Colonoscopy  05/19/11     Dr Renato Gails   ??? Colonoscopy  07/15/2012     Dr. Zollie Beckers       Meds:   Previous Medications    BUPROPION SR (WELLBUTRIN SR) 150 MG SR TABLET    Take 150 mg by mouth 2 times daily.    CHOLECALCIFEROL  (VITAMIN D3) 50000 UNITS CAPS    Take by mouth    FAMOTIDINE (PEPCID) 20 MG TABLET    Take 20 mg by mouth 2 times daily    LINACLOTIDE (LINZESS) 290 MCG CAPS CAPSULE    Take 1 capsule by mouth every morning (before breakfast)    MULTIPLE VITAMINS-MINERALS (THERAPEUTIC MULTIVITAMIN-MINERALS) TABLET    Take 1 tablet by mouth daily    OXCARBAZEPINE (TRILEPTAL) 150 MG TABLET    Take 150 mg by mouth 2 times daily    POLYETHYLENE GLYCOL (GLYCOLAX) POWDER    Take 17 g by mouth daily    QUETIAPINE (SEROQUEL) 50 MG TABLET    Take 50 mg by mouth daily.    TRAMADOL (ULTRAM) 50 MG TABLET    Take 50 mg by mouth every 6 hours as needed for Pain    TRAZODONE (DESYREL) 150 MG TABLET    Take 300 mg by mouth nightly.       SH:   History  Social History   ??? Marital Status: Married     Spouse Name: N/A     Number of Children: N/A   ??? Years of Education: N/A     Social History Main Topics   ??? Smoking status: Never Smoker    ??? Smokeless tobacco: Never Used   ??? Alcohol Use: No   ??? Drug Use: No   ??? Sexual Activity: None     Other Topics Concern   ??? None     Social History Narrative        Allergies: Decongestant and Motrin      PHYSICAL EXAM:    Triage vitals were reviewed.   ED Triage Vitals   BP Temp Temp Source Pulse Resp SpO2 Height Weight   -- 02/02/14 1601 02/02/14 1601 02/02/14 1601 02/02/14 1601 02/02/14 1601 02/02/14 1558 02/02/14 1558    98.4 ??F (36.9 ??C) Temporal 106 20 94 % 5\' 5"  (1.651 m) 205 lb (92.987 kg)       Nursing notes reviewed. Medication list, allergies, PMH, PSH reviewed.     Constitutional: This patient is lying in bed in no acute distress    HEENT:  Pupils are equal, round, and reactive to light. Extraocular eye movements are intact. Oropharynx is non-erythematous with no signs of infections or lesions.     Cardiovascular: The patient has a regular rate and rhythm.  There are no obvious murmurs. Pedal pulses are 2+ bilaterally.  Radial pulses are 2+ bilaterally.     Respiratory: The exam is clear to  auscultation bilaterally with normal effort.  There are no wheezes, crackles, or rales.    Gastrointestinal:  Soft, nontender, nondistended.  There are normal bowel sounds.  No guarding, no rebound.     Musculoskeletal:  There is no clubbing, cyanosis or edema.  The patient has a full range of motion in all extremities without pain.      Skin:  Warm and dry without rashes. 2 small superficial linear abrasions to left volar forearm with no signs of infection.     Neurological: Alert and oriented x3.  No focal neurological deficits.  Moves all extremities symmetrically with normal strength in all extremities.  Sensation to light touch intact in all extremities.     Psychiatric: Patient is tearful and depressed. States she is suicidal. Denies any homicidal ideations. Denies visual and auditory hallucinations. Patient has poor insight into her medical condition.     ASSESSMENT AND MEDICAL DECISION MAKING:    Patient was evaluated by psychiatry. She will be admitted to the psychiatric unit for further care of her suicidal ideations. Patient is a voluntary admission.      Laboratory Studies:   Labs Reviewed   CBC - Abnormal; Notable for the following:     RBC 5.54 (*)     MPV 11.1 (*)     All other components within normal limits   COMPREHENSIVE METABOLIC PANEL - Abnormal; Notable for the following:     CO2 21 (*)     Glucose 317 (*)     CREATININE 1.1 (*)     GFR Non-African American 51 (*)     Alkaline Phosphatase 120 (*)     ALT 76 (*)     AST 36 (*)     All other components within normal limits   URINALYSIS - Abnormal; Notable for the following:     Glucose, Ur >=1000 (*)     Ketones, Urine TRACE (*)     Protein,  UA TRACE (*)     All other components within normal limits   ETHANOL   URINE DRUG SCREEN        Filed Vitals:    02/02/14 1558 02/02/14 1601   Pulse:  106   Temp:  98.4 ??F (36.9 ??C)   TempSrc:  Temporal   Resp:  20   Height: 5\' 5"  (1.651 m)    Weight: 205 lb (92.987 kg)    SpO2:  94%       Final Diagnostic  Impression:    1. Suicidal ideations    2. Hyperglycemia          Procedures    ED Disposition/Plan:  DISPOSITION Admitted      Ralph Leydenlint Perlie Stene, MD      Sande Brotherslinton W Geralyn Figiel, MD  02/02/14 2029    Patient also has hyperglycemia. There is no DKA present. Given subcutaneous insulin before she is admitted to the psychiatric unit.     Sande Brotherslinton W Mychelle Kendra, MD  02/02/14 2030

## 2014-02-02 NOTE — ED Notes (Signed)
Meds given, see MAR, pt aware      Elouise MunroeKelly L Huff, RN  02/02/14 [phone]

## 2014-02-02 NOTE — Progress Notes (Signed)
Psychiatry Initial Intake  E.D. Called Intake at 7:05    02/02/14    Joyce Cohen ,a 58 y.o. female, for initial evaluation visit.  Patient is referred by therapist at [facility].      Reason:  He has been experiencing increase in depression with the holidays around the corner. Pt started cutting her arms two days ago and has been hitting herself in the head. Pt denies current suicidal ideation. "The things i've been doing to cause pain aren't enough anymore. I've actually been thinking of like breaking my arm or something". Pt states for 3 days she has been having thoughts to break her arm. "But my husband has been keeping an eye on me".    Duration: 3 days    Current Medications:  Scheduled Meds: No current facility-administered medications for this encounter.  Current outpatient prescriptions:traMADol (ULTRAM) 50 MG tablet, Take 50 mg by mouth every 6 hours as needed for Pain, Disp: , Rfl: ;  Cholecalciferol (VITAMIN D3) 50000 UNITS CAPS, Take by mouth, Disp: , Rfl: ;  OXcarbazepine (TRILEPTAL) 150 MG tablet, Take 150 mg by mouth 2 times daily, Disp: , Rfl: ;  famotidine (PEPCID) 20 MG tablet, Take 20 mg by mouth 2 times daily, Disp: , Rfl:   Multiple Vitamins-Minerals (THERAPEUTIC MULTIVITAMIN-MINERALS) tablet, Take 1 tablet by mouth daily, Disp: , Rfl: ;  polyethylene glycol (GLYCOLAX) powder, Take 17 g by mouth daily, Disp: , Rfl: ;  linaclotide (LINZESS) 290 MCG CAPS capsule, Take 1 capsule by mouth every morning (before breakfast), Disp: 30 capsule, Rfl: 11;  QUEtiapine (SEROQUEL) 50 MG tablet, Take 50 mg by mouth daily., Disp: , Rfl:   traZODone (DESYREL) 150 MG tablet, Take 300 mg by mouth nightly., Disp: , Rfl: ;  buPROPion SR (WELLBUTRIN SR) 150 MG SR tablet, Take 150 mg by mouth 2 times daily., Disp: , Rfl:     Stressors:  Holidays, Surveyor, quantity issues    History:     Past Psychiatric History:   Previous therapy: yes  Previous psychiatric treatment and medication trials:  yes  Previous psychiatric hospitalizations: yes  Previous diagnoses: yes  Previous suicide attempts:yes  History of violence: no  Currently in treatment with [facility] [facility].  Education: some college  Other Pertinent History: Financial    Substance Abuse History:    Use of Alcohol: denied  Use of Caffeine: coffee 1 /day  Tobacco use:no  Legal consequences of chemical use: no  Patient feels she ought to cut down on drinking and/or drug use:no  Patient has been annoyed by others criticizing her drinking or drug use: no  Patient has felt bad or guilty about her drinking or drug use:no  Patient has had a drink or used drugs as an eye opener first thing in the morning to steady nerves, get rid of a hangover or get the day started:no  Use of OTC: Multivitamin    Patient Active Problem List   Diagnosis   . RLQ abdominal pain   . Rectal bleeding   . Constipation   . Chronic constipation         History     Social History   . Marital Status: Married     Spouse Name: N/A     Number of Children: N/A   . Years of Education: N/A     Occupational History   . Not on file.     Social History Main Topics   . Smoking status: Never Smoker    .  Smokeless tobacco: Never Used   . Alcohol Use: No   . Drug Use: No   . Sexual Activity: Not on file     Other Topics Concern   . Not on file     Social History Narrative       Additional historical information includes:  None      Psychiatric Review Of Systems:   sleep: yes, "sleeping 10-11 hours a day"  appetite changes: "yes, I haven't been able to eat anything since Tuesday"  weight changes: yes, "I've been losing because I'm not eating like I use to". x3-4 weeks  energy/anergy: yes  interest/pleasure/anhedonia: yes  somatic symptoms: no  libido: no  anxiety/panic: yes  guilty/hopeless: yes  S.I.B.s/risky behavior: no  any drugs: no  alcohol: no     Mental Status Evaluation:     Appearance:  disheveled   Behavior:  Within Normal Limits   Speech:  normal pitch and normal volume    Mood:  anxious, depressed and sad   Affect:  normal and mood-congruent   Thought Process:  within normal limits   Thought Content:  Within Normal Limits   Sensorium:  person, place, time/date and situation   Cognition:  grossly intact   Insight:  good   Judgment:  fair     Suicidal Intentions: No  Suicidal Plan:  No    Other Pertinent Information  None    Assessment - Diagnosis - Goals:     Axis I: Bipolar Disorder, Type 1 by Hx  Axis II: Deferred  Axis III: Chronic nerve pain  Axis IV: Economic problems  Axis V: 41-50    Called Dr. Jenness Corner at 7:25pm for disposition. Pt admitted to [facility] unit.    Karma Lew, LPC

## 2014-02-02 NOTE — ED Notes (Signed)
[facility]    Kelly L Huff, RN  02/02/14 2104

## 2014-02-02 NOTE — ED Notes (Signed)
Pt reports to ED for depression, self-harm, and SI    Edwyna Shelleil Daylene Vandenbosch, RN  02/02/14 1601

## 2014-02-02 NOTE — ED Notes (Signed)
Personal belongings given to security to inspect and trasport, [facility] assist with W/C and transport to floor    Elouise MunroeKelly L Huff, RN  02/02/14 2117

## 2014-02-02 NOTE — ED Notes (Signed)
Voluntary signed and copied for floor and ED    Elouise MunroeKelly L Huff, RN  02/02/14 2043

## 2014-02-03 LAB — TSH: TSH: 0.62 u[IU]/mL (ref 0.27–4.20)

## 2014-02-03 LAB — HEMOGLOBIN A1C: Hemoglobin A1C: 10.4 % — ABNORMAL HIGH

## 2014-02-03 LAB — POCT GLUCOSE
POC Glucose: 147 mg/dl — ABNORMAL HIGH (ref 70–99)
POC Glucose: 283 mg/dl — ABNORMAL HIGH (ref 70–99)
POC Glucose: 373 mg/dl — ABNORMAL HIGH (ref 70–99)
POC Glucose: 283 mg/dl — ABNORMAL HIGH (ref 70–99)

## 2014-02-03 LAB — VITAMIN D 25 HYDROXY: Vit D, 25-Hydroxy: 24.3 ng/mL — ABNORMAL LOW (ref >=30)

## 2014-02-03 LAB — LDL CHOLESTEROL, DIRECT: LDL Direct: 122 mg/dL (ref <100)

## 2014-02-03 MED ORDER — OXCARBAZEPINE 150 MG PO TABS
150 MG | Freq: Two times a day (BID) | ORAL | Status: DC
Start: 2014-02-03 — End: 2014-02-07
  Administered 2014-02-03 – 2014-02-07 (×9): 150 mg via ORAL

## 2014-02-03 MED ORDER — FAMOTIDINE 20 MG PO TABS
20 MG | Freq: Two times a day (BID) | ORAL | Status: DC
Start: 2014-02-03 — End: 2014-02-03

## 2014-02-03 MED ORDER — INSULIN LISPRO 100 UNIT/ML SC SOLN
100 UNIT/ML | Freq: Four times a day (QID) | SUBCUTANEOUS | Status: DC | PRN
Start: 2014-02-03 — End: 2014-02-07
  Administered 2014-02-03: 18:00:00 5 [IU] via SUBCUTANEOUS
  Administered 2014-02-03 – 2014-02-04 (×4): 3 [IU] via SUBCUTANEOUS
  Administered 2014-02-04: 17:00:00 4 [IU] via SUBCUTANEOUS
  Administered 2014-02-04: 23:00:00 3 [IU] via SUBCUTANEOUS
  Administered 2014-02-05 (×2): 2 [IU] via SUBCUTANEOUS
  Administered 2014-02-05: 02:00:00 1 [IU] via SUBCUTANEOUS
  Administered 2014-02-05 – 2014-02-06 (×2): 2 [IU] via SUBCUTANEOUS
  Administered 2014-02-06: 02:00:00 3 [IU] via SUBCUTANEOUS
  Administered 2014-02-06: 12:00:00 2 [IU] via SUBCUTANEOUS
  Administered 2014-02-06 – 2014-02-07 (×2): 1 [IU] via SUBCUTANEOUS

## 2014-02-03 MED ORDER — GLUCOSE 40 % PO GEL
40 % | ORAL | Status: DC | PRN
Start: 2014-02-03 — End: 2014-02-07

## 2014-02-03 MED ORDER — METFORMIN HCL 500 MG PO TABS
500 MG | Freq: Two times a day (BID) | ORAL | Status: DC
Start: 2014-02-03 — End: 2014-02-04
  Administered 2014-02-03 (×3): 500 mg via ORAL

## 2014-02-03 MED ORDER — GLUCAGON HCL RDNA (DIAGNOSTIC) 1 MG IJ SOLR
1 MG | INTRAMUSCULAR | Status: DC | PRN
Start: 2014-02-03 — End: 2014-02-07

## 2014-02-03 MED ORDER — TRAZODONE HCL 100 MG PO TABS
100 MG | Freq: Every evening | ORAL | Status: DC
Start: 2014-02-03 — End: 2014-02-07
  Administered 2014-02-04 – 2014-02-07 (×4): 100 mg via ORAL

## 2014-02-03 MED ORDER — INSULIN REGULAR HUMAN 100 UNIT/ML IJ SOLN
100 UNIT/ML | Freq: Once | INTRAMUSCULAR | Status: AC
Start: 2014-02-03 — End: 2014-02-02
  Administered 2014-02-03: 01:00:00 10 [IU] via SUBCUTANEOUS

## 2014-02-03 MED ORDER — THERAPEUTIC MULTIVIT/MINERAL PO TABS
Freq: Every day | ORAL | Status: DC
Start: 2014-02-03 — End: 2014-02-07
  Administered 2014-02-03 – 2014-02-07 (×5): 1 via ORAL

## 2014-02-03 MED ORDER — OXYCODONE-ACETAMINOPHEN 5-325 MG PO TABS
5-325 MG | Freq: Once | ORAL | Status: AC
Start: 2014-02-03 — End: 2014-02-02
  Administered 2014-02-03: 01:00:00 1 via ORAL

## 2014-02-03 MED ORDER — ACETAMINOPHEN 325 MG PO TABS
325 | ORAL | Status: DC | PRN
Start: 2014-02-03 — End: 2014-02-07
  Administered 2014-02-03 – 2014-02-07 (×3): 650 mg via ORAL

## 2014-02-03 MED ORDER — QUETIAPINE FUMARATE 100 MG PO TABS
100 MG | Freq: Every evening | ORAL | Status: DC
Start: 2014-02-03 — End: 2014-02-07
  Administered 2014-02-04 – 2014-02-07 (×4): 400 mg via ORAL

## 2014-02-03 MED ORDER — LINACLOTIDE 145 MCG PO CAPS
145 MCG | Freq: Every day | ORAL | Status: DC
Start: 2014-02-03 — End: 2014-02-07
  Administered 2014-02-04 – 2014-02-07 (×4): 290 ug via ORAL

## 2014-02-03 MED ORDER — MAGNESIUM HYDROXIDE 400 MG/5ML PO SUSP
400 MG/5ML | Freq: Two times a day (BID) | ORAL | Status: DC
Start: 2014-02-03 — End: 2014-02-03

## 2014-02-03 MED ORDER — TRAZODONE HCL 50 MG PO TABS
50 MG | Freq: Every evening | ORAL | Status: DC | PRN
Start: 2014-02-03 — End: 2014-02-07

## 2014-02-03 MED ORDER — CLONIDINE HCL 0.1 MG PO TABS
0.1 MG | ORAL | Status: DC | PRN
Start: 2014-02-03 — End: 2014-02-07

## 2014-02-03 MED ORDER — FAMOTIDINE 20 MG PO TABS
20 MG | Freq: Two times a day (BID) | ORAL | Status: DC
Start: 2014-02-03 — End: 2014-02-07
  Administered 2014-02-03 – 2014-02-07 (×9): 20 mg via ORAL

## 2014-02-03 MED ORDER — TRAMADOL HCL 50 MG PO TABS
50 | Freq: Three times a day (TID) | ORAL | Status: DC | PRN
Start: 2014-02-03 — End: 2014-02-07
  Administered 2014-02-04 – 2014-02-06 (×3): 50 mg via ORAL

## 2014-02-03 MED ORDER — OLANZAPINE 10 MG PO TABS
10 MG | Freq: Every evening | ORAL | Status: AC
Start: 2014-02-03 — End: 2014-02-02
  Administered 2014-02-03: 05:00:00 10 mg via ORAL

## 2014-02-03 MED ORDER — LIDOCAINE-PRILOCAINE 2.5-2.5 % EX CREA
CUTANEOUS | Status: DC | PRN
Start: 2014-02-03 — End: 2014-02-07

## 2014-02-03 MED ORDER — DEXTROSE 50 % IV SOLN
50 % | INTRAVENOUS | Status: DC | PRN
Start: 2014-02-03 — End: 2014-02-02

## 2014-02-03 MED ORDER — DEXTROSE 5 % IV SOLN
5 % | INTRAVENOUS | Status: DC | PRN
Start: 2014-02-03 — End: 2014-02-02

## 2014-02-03 MED ORDER — VITAMIN D (ERGOCALCIFEROL) 1.25 MG (50000 UT) PO CAPS
1.25 MG (50000 UT) | ORAL | Status: DC
Start: 2014-02-03 — End: 2014-02-07
  Administered 2014-02-06: 14:00:00 50000 [IU] via ORAL

## 2014-02-03 MED ORDER — BUPROPION HCL ER (SR) 150 MG PO TB12
150 MG | Freq: Every day | ORAL | Status: DC
Start: 2014-02-03 — End: 2014-02-07
  Administered 2014-02-03 – 2014-02-07 (×5): 150 mg via ORAL

## 2014-02-03 MED ORDER — POLYETHYLENE GLYCOL 3350 17 G PO PACK
17 g | Freq: Every day | ORAL | Status: DC
Start: 2014-02-03 — End: 2014-02-07
  Administered 2014-02-04: 14:00:00 17 g via ORAL

## 2014-02-03 MED ORDER — LORAZEPAM 0.5 MG PO TABS
0.5 MG | Freq: Four times a day (QID) | ORAL | Status: DC | PRN
Start: 2014-02-03 — End: 2014-02-07
  Administered 2014-02-03: 14:00:00 0.5 mg via ORAL

## 2014-02-03 MED ORDER — MAGNESIUM HYDROXIDE 400 MG/5ML PO SUSP
400 MG/5ML | Freq: Two times a day (BID) | ORAL | Status: DC | PRN
Start: 2014-02-03 — End: 2014-02-07

## 2014-02-03 MED ORDER — INSULIN LISPRO 100 UNIT/ML SC SOLN
100 UNIT/ML | Freq: Four times a day (QID) | SUBCUTANEOUS | Status: DC
Start: 2014-02-03 — End: 2014-02-03

## 2014-02-03 MED FILL — OLANZAPINE 10 MG PO TABS: 10 MG | ORAL | Qty: 1

## 2014-02-03 MED FILL — LORAZEPAM 0.5 MG PO TABS: 0.5 MG | ORAL | Qty: 1

## 2014-02-03 MED FILL — GLUCAGEN DIAGNOSTIC 1 MG IJ SOLR: 1 MG | INTRAMUSCULAR | Qty: 1

## 2014-02-03 MED FILL — BUPROPION HCL ER (SR) 150 MG PO TB12: 150 MG | ORAL | Qty: 1

## 2014-02-03 MED FILL — FAMOTIDINE 20 MG PO TABS: 20 MG | ORAL | Qty: 1

## 2014-02-03 MED FILL — TYLENOL 325 MG PO TABS: 325 MG | ORAL | Qty: 2

## 2014-02-03 MED FILL — ABC PLUS SENIOR PO TABS: ORAL | Qty: 1

## 2014-02-03 MED FILL — METFORMIN HCL 500 MG PO TABS: 500 MG | ORAL | Qty: 1

## 2014-02-03 MED FILL — MIRALAX 17 G PO PACK: 17 g | ORAL | Qty: 1

## 2014-02-03 MED FILL — HUMALOG 100 UNIT/ML SC SOLN: 100 UNIT/ML | SUBCUTANEOUS | Qty: 3

## 2014-02-03 MED FILL — OXCARBAZEPINE 150 MG PO TABS: 150 MG | ORAL | Qty: 1

## 2014-02-03 MED FILL — HUMULIN R 100 UNIT/ML IJ SOLN: 100 UNIT/ML | INTRAMUSCULAR | Qty: 10

## 2014-02-03 MED FILL — OXYCODONE-ACETAMINOPHEN 5-325 MG PO TABS: 5-325 MG | ORAL | Qty: 1

## 2014-02-03 NOTE — Progress Notes (Signed)
Group Therapy Note    Date: 02/03/2014  Start Time: 0830  End Time:  0845  Number of Participants: 4    Type of Group: [employer] [facility]    Wellness Binder Information  Module Name:    Session Number:    Patient's Goal:  Pt stated pt's goal was to work on stuff from her child hood.    Notes: Pt attended filled out goal sheet and menu     Status After Intervention:  Unchanged    Participation Level: Active Listener and Minimal    Participation Quality: Appropriate      Speech:  normal      Thought Process/Content: Logical      Affective Functioning: Flat      Mood: depressed      Level of consciousness:  Alert and Attentive      Response to Learning: Able to verbalize/acknowledge new learning      Endings: None Reported    Modes of Intervention: Education, Support, Socialization, Teaching laboratory technicianClarifying, Activity and Movement      Discipline Responsible: [employer] OmahaBehavorial Health Tech      Signature:  Joyce OdeaAretta L Maines

## 2014-02-03 NOTE — Consults (Signed)
CONSULTATION REPORT    NAME OF CONSULTANT:  Ulanda EdisonJesse Aloysuis Ribaudo, MD    REFERRING PHYSICIAN:  Jeanene ErbLAURIE KAY BALLEW,    DATE OF ADMISSION:  02/02/2014    DATE OF CONSULTATION: 02/03/2014    The patient is a 58 year old white female who presents to the emergency room.   She is complaining of anxiety and depression.  She said this is a very bad  time of year for her.  She was here several months or so ago with similar  problem.  She states she does not feel well.  She does not think her  medicines are working well, so she is admitted for further treatment.   Previous problems include chronic constipation, bipolar disorder, mitral  valve prolapse, congenital absence of kidney, history of ulcer as a child,  joint pain, right lower quadrant pain, possible lumbar radiculopathy, history  of hemorrhoids, and history of pleurisy.      SOCIAL HISTORY   She does not smoke or drink.    SURGICAL HISTORY  Tonsillectomy, hysterectomy, appendectomy, laparotomy twice, rotator cuff  repair.    MEDICATIONS AT HOME  Include:  1.  Wellbutrin.  2.  Pepcid.  3.  Linzess.  4.  Vitamin D.  5.  Multivitamin.  6.  Seroquel.  7.  Tramadol.  8.  Ultram.  9.  Trazodone.    SOCIAL HISTORY   She does not smoke or drink.      FAMILY HISTORY   Positive for hypertension and bipolar disorder.    REVIEW OF SYSTEMS   No fever, chills, bleeding.  Says she does not feel well.  Her weight is  always high.  She has no hypertension, heart disease, diabetic or lipid  abnormalities.  She has mitral valve prolapse, history of ulcers, history of  chronic constipation, and right lumbar radiculopathy.  She has bronchitis at  times and says she coughs up thick mucus.  She reports no nausea, vomiting,  no change in bowel or bladder habits, no hepatitis, renal failure, or kidney  stones.      PHYSICAL EXAMINATION   GENERAL:  Evaluation shows a talkative, _____ white female.  VITAL SIGNS:  Weight 205, body mass index 34.2, blood pressure 107/63, pulse  77, respiratory rate 18.   She is afebrile.  HEENT:  Face is symmetrical, affect is flat.  Mucous membranes are moist.  NECK:  Supple.  There is no mass or adenopathy.  CHEST:  Clear.  CARDIAC:  S1, S2, but no S3.  ABDOMEN:  Soft with bowel sounds present.  EXTREMITIES:  No edema.  Skin is warm and dry.    ALLERGIES   IBUPROFEN, CABG, DECONGESTANTS, AND TOBACCO.    LABORATORY STUDIES   Her blood sugar is 283, her electrolytes negative.  Repeat sugar was 317.   Alkaline phosphatase 120.  Her ALT 76, AST 36.  Her CBC is negative.  Alcohol  level less than 10.  Urinalysis negative except for trace protein and  glucosuria.    DIAGNOSES  1.  Obesity.  2.  Chronic constipation.  3.  Mitral valve prolapse.  4.  Lumbar radiculopathy.  5.  Diabetes mellitus type 2, uncontrolled.  6.  Bronchitis.    We will check her lab work, adjust treatments, and follow responses to  medication.  The patient has had vitamin D deficiency and this may be checked  as well.        ________________________________  Ulanda EdisonJesse Lonnetta Kniskern, MD    [id number]  DD: 02/03/2014  07:23  DT: 02/03/2014 07:43  SSI File#: [id number]  Job #: [id number]    cc:   Raynelle HighlandJohn   Brazzell,  MD

## 2014-02-03 NOTE — Progress Notes (Addendum)
Suicidal Ideation/Behavior: Management of the Psychiatric Patient Progress Note    Data:  Pt in dining area eating evening meal.    Action:  1:1 Assessment.    Response:  Pt rates depression 3, denies anxiety, denies SI HI and AVH. Pt states she "feel[s] better" than yesterday and attributes this improvement to initiation of insulin tx. Pt reports she was diagnosed with DM II 02-02-14. Requests to meet with a dietitian for diabetes education. Uses a food bank and is concerned she may have difficulty maintaining a healthy diet. Appetite has improved. Pt ate 100% of evening meal. Reports she showered and washed her hair this morning, however, this is incongruent with reports from staff. Sleep last night was poor. Pt reports she did not receive sleep aid medication. Rates right inguinal nerve pain as 7. Has appointment with pain management Dr. Sandria ManlyLove 02-20-14. Chaplin visit today was helpful per pt. Brightened affect, pt smiling during interview. Mood is still depressed/sad but has improved. Behavior is pleasant and appropriate. Pt is compliant with medication administration. Will continue to monitor.    Leavy CellaMichelle Shaw Dobek, RN

## 2014-02-03 NOTE — Progress Notes (Signed)
Patient states she is tired and has not been sleeping real well tonight. Patient states she is not having any depression or anxiety at this time. Patient denies SI, HI and AVH at this time.

## 2014-02-03 NOTE — Plan of Care (Signed)
Problem: Altered Mood, Depressive Behavior  Goal: LTG-Able to verbalize and/or display a decrease in depressive symptoms  Outcome: Ongoing  Pt verbalizes decrease in depression, rates depression level 3/10, pt "feel[s] better".  Goal: STG-Able to verbalize suicidal ideations  Outcome: Ongoing  Pt denies SI at this time.  Goal: LTG-Absence of self-harm  No evidence of self harm today.    Problem: Health Behavior:  Intervention: Use therapeutic communication skills to develop a trusting relationship  Greeted pt, introduced self. Utilized open-ended questions during shift assessment. Encouraged pt to let me know if she needs anything.  Intervention: Patient observation  Pt within eyesight.

## 2014-02-03 NOTE — Progress Notes (Signed)
Group Therapy Note    Date: 02/03/2014  Start Time: 1930  End Time:  2045  Number of Participants: 2    Type of Group: Relapse Prevention    Wellness Binder Information  Module Name:  Coping Mechanism  Session Number:      Patient's Goal:  Develop and acknowledge coping skills.    Notes:  Pt was interactive and expressive to personal events and past experiences, and past and new coping skills    Status After Intervention:  Improved    Participation Level: Interactive    Participation Quality: Appropriate, Attentive and Sharing      Speech:  normal and tearful with emotion to past experiences      Thought Process/Content: Logical      Affective Functioning: Congruent      Mood: anxious, elevated and tearful      Level of consciousness:  Oriented x4      Response to Learning: Able to verbalize current knowledge/experience, Able to verbalize/acknowledge new learning and Able to retain information      Endings: Able to express feeling of self harm    Modes of Intervention: Socialization and Clarifying      Discipline Responsible: Registered Nurse      Signature:  Hortencia ConradiLora A Rafiq Bucklin, RN

## 2014-02-03 NOTE — Plan of Care (Signed)
Problem: Altered Mood, Depressive Behavior  Goal: STG-Knowledge of positive coping patterns  Outcome: Ongoing  Pt discussed how she is normally depressed around Christmas.  She said that she had memories of that time which make her feel unworthy and inadequate.  Group offered support and pt identified that she planned to work on positive self-talk to help decrease her depression.

## 2014-02-03 NOTE — Progress Notes (Signed)
S:  Pt stated - "Always hard in times (Christmas) like this."  Said, she likes to receive gift.  O:  Pt was tearful, while narrating the circumstances of her childhood, growing up;    Appeared to be engaging later in conversation.  A:  Pt seems to see a point of taking care of "inner child" in her, giving gift more of love rather than    the value of the present, resolved "not to pass up" this inner child again - to do this each time              the tragic memory haunts back to cause emotional/spiritual pain.   Receptive to spiritual care.  P:  Encouraged pt to attend to pain and effort to care for the inner child.

## 2014-02-03 NOTE — Progress Notes (Signed)
SW completed CSSR-S and spoke with pt about f/u care
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
## 2014-02-03 NOTE — Plan of Care (Signed)
Problem: Altered Mood, Depressive Behavior  Goal: LTG-Able to verbalize and/or display a decrease in depressive symptoms  Pt rates depression 5-6 on a scale of 1-10  Goal: STG-Able to verbalize suicidal ideations  Pt states not suicidal at this time  Goal: LTG-Absence of self-harm  Pt has had no atempts of self harm today  Goal: STG-Knowledge of positive coping patterns  Pt educated on deep breathing and relaxation techniques    Problem: Health Behavior:  Intervention: Encourage verbalization of feelings  Pt states " I am having negative memories of my childhood" pt encouraged to elaborate  Intervention: Use therapeutic communication skills to develop a trusting relationship  Continue group therapy to establish trusting relationship  Intervention: Patient observation  Pt eye sight at all times    Goal: Ability to verbalize adaptive coping strategies to use when suicidal feelings occur will improve  Ability to verbalize adaptive coping strategies to use when suicidal feelings occur will improve   Pt verbalizes using positive affirmations when feeling suicidal

## 2014-02-03 NOTE — Behavioral Health Treatment Team (Signed)
Medications verified at [facility]

## 2014-02-04 LAB — POCT GLUCOSE
POC Glucose: 286 mg/dl — ABNORMAL HIGH (ref 70–99)
POC Glucose: 299 mg/dl — ABNORMAL HIGH (ref 70–99)
POC Glucose: 349 mg/dl — ABNORMAL HIGH (ref 70–99)
POC Glucose: 274 mg/dl — ABNORMAL HIGH (ref 70–99)

## 2014-02-04 MED ORDER — GLIPIZIDE 5 MG PO TABS
5 MG | Freq: Every day | ORAL | Status: DC
Start: 2014-02-04 — End: 2014-02-07
  Administered 2014-02-05 – 2014-02-07 (×3): 10 mg via ORAL

## 2014-02-04 MED ORDER — METFORMIN HCL 500 MG PO TABS
500 MG | Freq: Two times a day (BID) | ORAL | Status: DC
Start: 2014-02-04 — End: 2014-02-07
  Administered 2014-02-04 – 2014-02-07 (×7): 1000 mg via ORAL

## 2014-02-04 MED ORDER — FISH OIL 1000 MG PO CAPS
1000 MG | Freq: Two times a day (BID) | ORAL | Status: DC
Start: 2014-02-04 — End: 2014-02-07
  Administered 2014-02-04 – 2014-02-07 (×7): 1000 mg via ORAL

## 2014-02-04 MED FILL — QUETIAPINE FUMARATE 100 MG PO TABS: 100 MG | ORAL | Qty: 4

## 2014-02-04 MED FILL — ABC PLUS SENIOR PO TABS: ORAL | Qty: 1

## 2014-02-04 MED FILL — METFORMIN HCL 500 MG PO TABS: 500 MG | ORAL | Qty: 2

## 2014-02-04 MED FILL — FAMOTIDINE 20 MG PO TABS: 20 MG | ORAL | Qty: 1

## 2014-02-04 MED FILL — TRAZODONE HCL 100 MG PO TABS: 100 MG | ORAL | Qty: 1

## 2014-02-04 MED FILL — MIRALAX 17 G PO PACK: 17 g | ORAL | Qty: 1

## 2014-02-04 MED FILL — LINZESS 145 MCG PO CAPS: 145 MCG | ORAL | Qty: 2

## 2014-02-04 MED FILL — BUPROPION HCL ER (SR) 150 MG PO TB12: 150 MG | ORAL | Qty: 1

## 2014-02-04 MED FILL — TRAMADOL HCL 50 MG PO TABS: 50 MG | ORAL | Qty: 1

## 2014-02-04 MED FILL — OXCARBAZEPINE 150 MG PO TABS: 150 MG | ORAL | Qty: 1

## 2014-02-04 MED FILL — FISH OIL 1000 MG PO CAPS: 1000 MG | ORAL | Qty: 1

## 2014-02-04 NOTE — Progress Notes (Signed)
SW met with treatment team to discuss pt's progress and setbacks.  SW 2 was present.  Pt was admitted for depression, SI with a plan to harm self.  Since admission, pt has had flat affect, poor appetite, reporting bad memories from childhood.  Requested to speak with a chaplain, request was made, chaplain visited yesterday, diagnosed with Diabetes, manage medications, encourage group participation, continue current treatment plan.

## 2014-02-04 NOTE — Progress Notes (Signed)
Patient did not rate today yet does state today she still does need therapy due to depression and things from childhood memories have just came to memory and are affecting life at this point, cutting history to reappear therefore pt started cutting superficially bilateral forearms.

## 2014-02-04 NOTE — Plan of Care (Signed)
Problem: Altered Mood, Depressive Behavior  Goal: LTG-Able to verbalize and/or display a decrease in depressive symptoms  Pt states mood improved this shift  Goal: STG-Able to verbalize suicidal ideations  Pt states none this shift  Goal: LTG-Absence of self-harm  Pt states no wish for self harm this shift   Goal: STG-Knowledge of positive coping patterns  Pt states that she feels less despondent and more capable having gained understanding of her condition and how to do steps for maintenance    Problem: Health Behavior:  Intervention: Encourage verbalization of feelings  Pt agrees to talk about her feelings  Intervention: Use therapeutic communication skills to develop a trusting relationship  I helped her gain awareness and explained what I knew about her condition  Intervention: Patient observation  Pt has been positive in attitude this shift    Goal: Ability to verbalize adaptive coping strategies to use when suicidal feelings occur will improve  Ability to verbalize adaptive coping strategies to use when suicidal feelings occur will improve   Pt states she understands her illness is not her fault and that she can get help

## 2014-02-04 NOTE — Plan of Care (Signed)
Problem: Altered Mood, Depressive Behavior  Goal: STG-Knowledge of positive coping patterns  Outcome: Ongoing                                                                      Group Therapy Note    Date: 02/04/2014  Start Time: 1430  End Time:  1530  Number of Participants: 1    Type of Group: Geophysicist/field seismologist Information  Module Name:  Crafts  Session Number:      Patient's Goal:      Notes:  Pt was an active participant in group activity, crafts, to promote the benefits of positive diversionary coping skills.     Status After Intervention:  Improved    Participation Level: Active Listener and Interactive    Participation Quality: Appropriate, Attentive and Sharing      Speech:  normal      Thought Process/Content: Logical      Affective Functioning: Blunted      Mood: depressed      Level of consciousness:  Alert and Oriented x4      Response to Learning: Able to verbalize current knowledge/experience, Able to verbalize/acknowledge new learning, Able to retain information, Capable of insight and Able to change behavior      Endings:     Modes of Intervention: Education      Discipline Responsible: Psychoeducational Specialist      Signature:  Clydell Hakim

## 2014-02-04 NOTE — Plan of Care (Signed)
Problem: Altered Mood, Depressive Behavior  Goal: STG-Knowledge of positive coping patterns  Outcome: Ongoing                                                                      Group Therapy Note    Date: 02/04/2014  Start Time: 1100  End Time:  1200  Number of Participants: 2    Type of Group: Psychoeducation    Wellness Binder Information  Module Name:  Developing a wellness toolbox  Session Number:      Patient's Goal:      Notes:  Pt was an active participant in group discussion pertaining to staying well after discharge.  Pt worked with peer to develop a Office manager.    Status After Intervention:  Improved    Participation Level: Active Listener and Interactive    Participation Quality: Appropriate, Attentive and Sharing      Speech:  normal      Thought Process/Content: Logical      Affective Functioning: Blunted      Mood: depressed      Level of consciousness:  Alert and Oriented x4      Response to Learning: Able to verbalize current knowledge/experience, Able to verbalize/acknowledge new learning, Able to retain information and Able to change behavior      Endings:     Modes of Intervention: Education      Discipline Responsible: Psychoeducational Specialist      Signature:  Clydell Hakim

## 2014-02-04 NOTE — Progress Notes (Signed)
Group Therapy Note    Date: 02/04/2014  Start Time: 1930  End Time:  2000  Number of Participants: 1     Type of Group: Psychoeducation    Wellness Binder Information  Module Name:  Nursing  Session Number:      Patient's Goal:  Informative, Patient Positive impact    Notes:  Pt was receptive, creative with arts, crafts, reinforcement to nursing information.     Status After Intervention:  Improved    Participation Level: Active Listener and Interactive    Participation Quality: Appropriate, Attentive, Sharing and Supportive      Speech:  normal      Thought Process/Content: Logical      Affective Functioning: Congruent      Mood: Calm, cooperative, expressive of feelings, thoughts and ideas      Level of consciousness:  Alert and Oriented x4      Response to Learning: Able to verbalize current knowledge/experience, Able to verbalize/acknowledge new learning and Able to retain information      Endings: None Reported    Modes of Intervention: Education, Support, Socialization and Activity      Discipline Responsible: Registered Nurse      Signature:  Hortencia ConradiLora A Curry Seefeldt, RN

## 2014-02-04 NOTE — Progress Notes (Signed)
Nutrition Therapy    Type and Reason for Visit: Initial, Patient Education    Nutrition Recommendations: Continue current diet    Subjective: My Granfather had diabetes and I remember him taking me to the store to get candy and he would get himself a candybar.    Malnutrition Status: No malnutrition    Nutrition Assessment:   Nutrition-Focused Physical Findings:  newly diagnosed with diabetes   Current Nutrition Therapies   Oral Diet Orders: Carb Control 4 Carbs/Meal    Oral Diet intake: 76-100%      Anthropometric Measures: Ht: 5\' 5"  (165.1 cm)    Current Body Wt: 205 lb (92.987 kg)   BMI Classification: BMI 30.0 - 34.9 Obese Class I    Nutrition Diagnosis: Food and nutrition knowledge deficit related to carbohydrate in food and carbohydrate counting as evidenced by elevated glucose levels and HgbA1c    Nutrition Interventions: Continued Inpatient Monitoring, Education Completed    Nutrition Evaluation:   follow 12/21, telephone number given for questions once discharged    Electronically signed by Grandville Silos, MS, RD, LD on 02/04/14 at 2:39 PM

## 2014-02-04 NOTE — Progress Notes (Signed)
Joyce Cohen is a 58 y.o. female patient.    Current Facility-Administered Medications   Medication Dose Route Frequency Provider Last Rate Last Dose   ??? magnesium hydroxide (MILK OF MAGNESIA) 400 MG/5ML suspension 30 mL  30 mL Oral BID PRN Marcelina Morel Ballew, DO       ??? insulin lispro (HUMALOG) injection vial 0-6 Units  0-6 Units Subcutaneous 4x Daily PRN Jeanene Erb, DO   3 Units at 02/03/14 2103   ??? metFORMIN (GLUCOPHAGE) tablet 500 mg  500 mg Oral BID WC Ulanda Edison, MD   500 mg at 02/03/14 1659   ??? buPROPion SR [facility] SR) SR tablet 150 mg  150 mg Oral Daily Marcelina Morel [place], DO   150 mg at 02/03/14 1327   ??? [START ON 02/06/2014] vitamin D (ERGOCALCIFEROL) capsule 50,000 Units  50,000 Units Oral Weekly Marcelina Morel Ballew, DO       ??? famotidine (PEPCID) tablet 20 mg  20 mg Oral BID Marcelina Morel Ballew, DO   20 mg at 02/03/14 2106   ??? lidocaine-prilocaine (EMLA) cream   Topical PRN Marcelina Morel Ballew, DO       ??? linaclotide [facility]) capsule 290 mcg  290 mcg Oral QAM AC Marcelina Morel [place], DO       ??? therapeutic multivitamin-minerals 1 tablet  1 tablet Oral Daily Marcelina Morel [place], DO   1 tablet at 02/03/14 1333   ??? OXcarbazepine (TRILEPTAL) tablet 150 mg  150 mg Oral BID Marcelina Morel Ballew, DO   150 mg at 02/03/14 2106   ??? polyethylene glycol (GLYCOLAX) packet 17 g  17 g Oral Daily Marcelina Morel [place], DO       ??? QUEtiapine (SEROQUEL) tablet 400 mg  400 mg Oral Nightly Marcelina Morel [place], DO   400 mg at 02/03/14 2106   ??? traMADol (ULTRAM) tablet 50 mg  50 mg Oral Q8H PRN Jeanene Erb, DO   50 mg at 02/03/14 2106   ??? traZODone (DESYREL) tablet 100 mg  100 mg Oral Nightly Marcelina Morel [place], DO   100 mg at 02/03/14 2105   ??? LORazepam (ATIVAN) tablet 0.5 mg  0.5 mg Oral Q6H PRN Marcelina Morel Ballew, DO   0.5 mg at 02/03/14 0102   ??? acetaminophen (TYLENOL) tablet 650 mg  650 mg Oral Q4H PRN Marcelina Morel Ballew, DO   650 mg at 02/03/14 7253   ??? traZODone (DESYREL) tablet 50 mg  50 mg Oral Nightly PRN Jeanene Erb, DO       ??? cloNIDine (CATAPRES) tablet 0.1 mg  0.1 mg Oral Q4H PRN Marcelina Morel Ballew, DO       ??? glucose (GLUTOSE) 40 % oral gel 15 g  15 g Oral PRN Jeanene Erb, DO       ??? glucagon (rDNA) injection 1 mg  1 mg Intramuscular PRN Jeanene Erb, DO         Allergies   Allergen Reactions   ??? Tobacco [Nicotiana Tabacum] Shortness Of Breath   ??? Decongestant [Pseudoephedrine Hcl]    ??? Motrin [Ibuprofen]    ??? Cabbage Nausea And Vomiting     Principal Problem:    Major depression, recurrent, chronic (HCC)  Active Problems:    Obesity (BMI 30-39.9)    MVP (mitral valve prolapse)    Type 2 diabetes mellitus (HCC)    Right lumbar radiculopathy    Chronic constipation    Hypercholesteremia    Vitamin D deficiency  Blood pressure 112/72, pulse 101, temperature 98.4 ??F (36.9 ??C), temperature source Temporal, resp. rate 18, height 5\' 5"  (1.651 m), weight 205 lb (92.987 kg), SpO2 98 %.    Subjective:  Symptoms:  Stable.  She reports anxiety.  No shortness of breath, chest pain or chest pressure.    Diet:  Adequate intake.  No nausea or vomiting.    Activity level: Normal.    Pain:  She reports no pain.      Objective:  General Appearance:  Comfortable.    Vital signs: (most recent): Blood pressure 112/72, pulse 101, temperature 98.4 ??F (36.9 ??C), temperature source Temporal, resp. rate 18, height 5\' 5"  (1.651 m), weight 205 lb (92.987 kg), SpO2 98 %.  Vital signs are normal.  No fever.    Output: Producing urine.    HEENT: Normal HEENT exam.    Lungs:  Normal respiratory rate and normal effort.  Breath sounds clear to auscultation.  No wheezes or rhonchi.    Heart: Normal rate.  Regular rhythm.  S1 normal and S2 normal.    Extremities: Normal range of motion.    Neurological: Patient is alert and oriented to person, place and time.    Skin:  Warm and dry.    Abdomen: Abdomen is soft and flat.  Bowel sounds are normal.     Pupils:  Pupils are equal, round, and reactive to light.      Assessment:    Condition:  In stable condition.       Plan:   Encourage ambulation and out of bed and up to chair.  Restricted diet.  ( Last Refreshed: 02/04/14 0602 (Time Mark Orders)    Results       Component Value Units   POCT Glucose ([id number]) (Abnormal) Collected: 02/03/14 2039   Updated: 02/03/14 2047     POC Glucose 286 (H) mg/dl    Performed on AccuChek      Comment: RN notified  Protocal Followed       TSH without Reflex ([id number]) Collected: 02/03/14 1530   Updated: 02/03/14 1814    Specimen Source: Blood     TSH 0.62 uIU/mL   Vitamin D 25 Hydroxy ([id number]) (Abnormal) Collected: 02/03/14 1530   Updated: 02/03/14 1716    Specimen Source: Blood     Vit D, 25-Hydroxy 24.3 (L) ng/mL      Comment: <=20 ng/mL............Marland KitchenDeficient  21-29 ng/mL..........Marland KitchenInsufficient  >=30 ng/mL.........Marland KitchenSufficient       LDL Cholesterol, Direct ([id number]) Collected: 02/03/14 1530   Updated: 02/03/14 1659    Specimen Source: Blood     LDL Direct 122 mg/dL      Comment: <629 MG/DL=OPITIMAL    528-413 MG/DL=DESIRABLE    244-010 MG/DL BORDERLINE=INCREASED RISK OF ATHEROSCLEROTIC  CARDIOVASCULAR DISEASE    > OR = 160 MG/DL=ASSOCIATED WITH AN INCREASE RISK OF  ATHEROSCLEROTIC CARDIOVASCULAR DISEASE       POCT Glucose ([id number]) (Abnormal) Collected: 02/03/14 1620   Updated: 02/03/14 1629     POC Glucose 283 (H) mg/dl    Performed on AccuChek   Hemoglobin A1C ([id number]) (Abnormal) Collected: 02/03/14 1530   Updated: 02/03/14 1629    Specimen Source: Blood     Hemoglobin A1C 10.4 (H) %      Comment: < OR = 6.2% NON-DIABETIC/GOOD CONTROL    6.2-6.9% BORDERLINE CONTROL    > OR = 7.0%: THE [employer] RECOMMENDS  ACTIVE INTERVENTION IF GLYCOSYLATED HEMOGLOBIN LEVELS ARE  CONSISTENTLY 7% OR GREATER  POCT Glucose ([id number]) (Abnormal) Collected: 02/03/14 1310   Updated: 02/03/14 1319     POC Glucose 373 (H) mg/dl    Performed on AccuChek   POCT Glucose ([id number]) (Abnormal) Collected: 02/03/14 0609   Updated: 02/03/14 0616     POC  Glucose 283 (H) mg/dl    Performed on AccuChek    Current IP Meds  Hide         Frequency      acetaminophen (TYLENOL) tablet 650 mg Discontinue   EVERY 4 HOURS PRN      buPROPion SR (WELLBUTRIN SR) SR tablet 150 mg Discontinue   DAILY      cloNIDine (CATAPRES) tablet 0.1 mg Discontinue   EVERY 4 HOURS PRN      famotidine (PEPCID) tablet 20 mg Discontinue   2 TIMES DAILY      glucagon (rDNA) injection 1 mg Discontinue   PRN      glucose (GLUTOSE) 40 % oral gel 15 g Discontinue   PRN      insulin lispro (HUMALOG) injection vial 0-6 Units Discontinue   4 TIMES DAILY PRN      lidocaine-prilocaine (EMLA) cream Discontinue   PRN      linaclotide (LINZESS) capsule 290 mcg Discontinue   DAILY BEFORE BREAKFAST      LORazepam (ATIVAN) tablet 0.5 mg Discontinue   EVERY 6 HOURS PRN      magnesium hydroxide (MILK OF MAGNESIA) 400 MG/5ML suspension 30 mL DiscontinueComments: PO BID PRN for Constipation   2 TIMES DAILY PRN      metFORMIN (GLUCOPHAGE) tablet 500 mg Discontinue   2 TIMES DAILY WITH MEALS      OXcarbazepine (TRILEPTAL) tablet 150 mg Discontinue   2 TIMES DAILY      polyethylene glycol (GLYCOLAX) packet 17 g Discontinue   DAILY      QUEtiapine (SEROQUEL) tablet 400 mg Discontinue   NIGHTLY      therapeutic multivitamin-minerals 1 tablet Discontinue   DAILY      traMADol (ULTRAM) tablet 50 mg Discontinue   EVERY 8 HOURS PRN      traZODone (DESYREL) tablet 100 mg Discontinue   NIGHTLY      traZODone (DESYREL) tablet 50 mg Discontinue   NIGHTLY PRN      vitamin D (ERGOCALCIFEROL) capsule 50,000 Units Discontinue   WEEKLY    Intake/Output    None     Last Refreshed: 02/04/14 0602 (Time Mark Orders)    Results       Component Value Units   POCT Glucose ([id number]) (Abnormal) Collected: 02/03/14 2039   Updated: 02/03/14 2047     POC Glucose 286 (H) mg/dl    Performed on AccuChek      Comment: RN notified  Protocal Followed       TSH without Reflex ([id number]) Collected: 02/03/14 1530   Updated: 02/03/14 1814    Specimen  Source: Blood     TSH 0.62 uIU/mL   Vitamin D 25 Hydroxy ([id number]) (Abnormal) Collected: 02/03/14 1530   Updated: 02/03/14 1716    Specimen Source: Blood     Vit D, 25-Hydroxy 24.3 (L) ng/mL      Comment: <=20 ng/mL............Marland Kitchen.Deficient  21-29 ng/mL..........Marland Kitchen.Insufficient  >=30 ng/mL.........Marland Kitchen.Sufficient       LDL Cholesterol, Direct ([id number]) Collected: 02/03/14 1530   Updated: 02/03/14 1659    Specimen Source: Blood     LDL Direct 122 mg/dL      Comment: <563<100 MG/DL=OPITIMAL    [phone] MG/DL=DESIRABLE    [phone] MG/DL  BORDERLINE=INCREASED RISK OF ATHEROSCLEROTIC  CARDIOVASCULAR DISEASE    > OR = 160 MG/DL=ASSOCIATED WITH AN INCREASE RISK OF  ATHEROSCLEROTIC CARDIOVASCULAR DISEASE       POCT Glucose ([id number]) (Abnormal) Collected: 02/03/14 1620   Updated: 02/03/14 1629     POC Glucose 283 (H) mg/dl    Performed on AccuChek   Hemoglobin A1C ([id number]) (Abnormal) Collected: 02/03/14 1530   Updated: 02/03/14 1629    Specimen Source: Blood     Hemoglobin A1C 10.4 (H) %      Comment: < OR = 6.2% NON-DIABETIC/GOOD CONTROL    6.2-6.9% BORDERLINE CONTROL    > OR = 7.0%: THE [employer]ATION RECOMMENDS  ACTIVE INTERVENTION IF GLYCOSYLATED HEMOGLOBIN LEVELS ARE  CONSISTENTLY 7% OR GREATER       POCT Glucose ([id number]) (Abnormal) Collected: 02/03/14 1310   Updated: 02/03/14 1319     POC Glucose 373 (H) mg/dl    Performed on AccuChek   POCT Glucose ([id number]) (Abnormal) Collected: 02/03/14 6295   Updated: 02/03/14 0616     POC Glucose 283 (H) mg/dl    Performed on AccuChek    Current IP Meds  Hide         Frequency      acetaminophen (TYLENOL) tablet 650 mg Discontinue   EVERY 4 HOURS PRN      buPROPion SR (WELLBUTRIN SR) SR tablet 150 mg Discontinue   DAILY      cloNIDine (CATAPRES) tablet 0.1 mg Discontinue   EVERY 4 HOURS PRN      famotidine (PEPCID) tablet 20 mg Discontinue   2 TIMES DAILY      glucagon (rDNA) injection 1 mg Discontinue   PRN      glucose (GLUTOSE) 40 % oral gel 15 g  Discontinue   PRN      insulin lispro (HUMALOG) injection vial 0-6 Units Discontinue   4 TIMES DAILY PRN      lidocaine-prilocaine (EMLA) cream Discontinue   PRN      linaclotide (LINZESS) capsule 290 mcg Discontinue   DAILY BEFORE BREAKFAST      LORazepam (ATIVAN) tablet 0.5 mg Discontinue   EVERY 6 HOURS PRN      magnesium hydroxide (MILK OF MAGNESIA) 400 MG/5ML suspension 30 mL DiscontinueComments: PO BID PRN for Constipation   2 TIMES DAILY PRN      metFORMIN (GLUCOPHAGE) tablet 500 mg Discontinue   2 TIMES DAILY WITH MEALS      OXcarbazepine (TRILEPTAL) tablet 150 mg Discontinue   2 TIMES DAILY      polyethylene glycol (GLYCOLAX) packet 17 g Discontinue   DAILY      QUEtiapine (SEROQUEL) tablet 400 mg Discontinue   NIGHTLY      therapeutic multivitamin-minerals 1 tablet Discontinue   DAILY      traMADol (ULTRAM) tablet 50 mg Discontinue   EVERY 8 HOURS PRN      traZODone (DESYREL) tablet 100 mg Discontinue   NIGHTLY      traZODone (DESYREL) tablet 50 mg Discontinue   NIGHTLY PRN      vitamin D (ERGOCALCIFEROL) capsule 50,000 Units Discontinue   WEEKLY    Intake/Output    None            POC Glucose 283 (H) mg/dl    Performed on AccuChek   Hemoglobin A1C ([id number]) (Abnormal) Collected: 02/03/14 1530   Updated: 02/03/14 1629    Specimen Source: Blood     Hemoglobin A1C 10.4 (H     BS OVER 200-  WILL INCREASE RX  ldl  122- will rx  Vit d is low  See rx and tx.        ).       Ulanda EdisonJesse Juline Sanderford, MD  02/04/2014

## 2014-02-04 NOTE — Behavioral Health Treatment Team (Signed)
Group Therapy Note    Date:   Start Time: 16:00   End Time:  16:30    Number of Participants: 2    Type of Group: community goals/unit rules    Wellness Binder Information  Module Name:        Patient's Goal:        Notes:      Status After Intervention:  Unchanged    Participation Level: Active Listener    Participation Quality: Appropriate, Attentive, Sharing and Supportive      Speech:  normal      Thought Process/Content: Logical      Affective Functioning: Congruent      Mood: bright      Level of consciousness:  Alert      Response to Learning: Able to verbalize current knowledge/experience      Modes of Intervention: Education and Support      Discipline Responsible: Registered Nurse      Signature:  Raleigh Callasimothy W Price, RN                                                                      Group Therapy Note    Date:   Start Time: 16:00   End Time:  16:30    Number of Participants: 2    Type of Group: Healthy Living/Wellness    Wellness Binder Information  Module Name:      Patient's Goal:      Notes:      Status After Intervention:  Unchanged    Participation Level: Active Listener and Interactive    Participation Quality: Appropriate, Attentive, Sharing and Supportive      Speech:  normal      Thought Process/Content: Logical      Affective Functioning: Congruent      Mood: brighter      Level of consciousness:  Alert      Response to Learning: Able to verbalize current knowledge/experience      Modes of Intervention: Education and Support      Discipline Responsible: Registered Nurse      Signature:  Raleigh Callasimothy W Price, RN

## 2014-02-04 NOTE — Progress Notes (Signed)
Pt sitting in day use area.  Completed LPN assessment in 10 minutes.  Pt stated having a good day, and being in a positive mood. She said having insulin made her feel much better physically. She states not feeling manic, or having any anxiety.She has done art and craft today, and enjoyed it.   Her vital signs are stable, her gait is steady, her affect is calm and agreeable, her insight seems to be improving.  She functions in all areas and in every activity on the unit: group-attended, slept-well, ate-all, socialized,  affect-calm/bright, ADLs-done without assistance. She rates pain of 6 in her inguinal nerve, but says it is tolerable.   I gave her Tylenol 650 mg for pain @ 1924. Her Glucose was 274 @ 1600, for which I gave Humalog 3u @ 1747.  She reported no depression, no anxiety, no SI, HI, or AVH. She has been cooperative this shift.

## 2014-02-04 NOTE — Progress Notes (Signed)
Group Therapy Note    Date: 02/04/2014  Start Time: 1630  End Time:  1645  Number of Participants: 1    Type of Group: education    Engineer, miningWellness Binder Information  Module Name:    Session Number:      Patient's Goal: manage illness effectively    Notes:  none    Status After Intervention:  hopefull    Participation Level: high    Participation Quality: high      Speech:  clear      Thought Process/Content: relevant      Affective Functioning: WDL      Mood: calm/agreeable      Level of consciousness:  alert      Response to Learning: receptive      Endings: none    Modes of Intervention: verbal instruction      Discipline Responsible: nursing      Signature:  Reece Agaravid B Margean Korell, LPN

## 2014-02-04 NOTE — Behavioral Health Treatment Team (Signed)
Group Therapy Note    Date: 02/04/2014  Start Time:   End Time:    Number of Participants: 2    Type of Group: WORSHIP SERVICE    Wellness Binder Information  Module Name:    Session Number:        Patient's Goal:      Notes:      Status After Intervention:  Improved    Participation Level: Active Listener    Participation Quality: Attentive and Sharing      Speech:  normal      Thought Process/Content: Logical      Affective Functioning: Congruent      Mood: euthymic      Level of consciousness:  Alert and Attentive      Response to Learning: Able to verbalize/acknowledge new learning and Capable of insight    Modes of Intervention: Support      Discipline Responsible: CHAPLAIN      Signature:  Arlan Organomeo Tomas Fryda Molenda, [facility]

## 2014-02-04 NOTE — Progress Notes (Signed)
SW completed psychosocial assessment with pt

## 2014-02-04 NOTE — Progress Notes (Signed)
Data:  Patient resting in bed with eyes closed.  Action:  Assessment completed and vital signs obtained.  Response: Patient reports slept well during the night.  Denies pain or discomfort at this time. Rates depression 0, anxiety 0, and denies SI, HI, And AVH.

## 2014-02-04 NOTE — Progress Notes (Signed)
02/04/2014 2:56 PM   Progress Note        Joyce Cohen March 16, 1955  Psychotherapy Time Spent: 25 min      Psychotherapy Topics: family, financial and health    Chief Complaint   Patient presents with   ??? Psychiatric Evaluation   Depression and suicidal thoughts    Subjective:  Pt states she is feeling better today. Contracts for safety while here in the hospital. Trying to work on coping skills for when she is at home and stressors are present.    Patient reports side effects as follows: none.  Reports no  suicidal ideation while here in the hospital, says she has had thoughts when she thinks about being discharged..  Reports compliance with medications as good .     Review of Systems - Psychological ROS: positive for - anxiety, behavioral disorder, depression and suicidal ideation  Ophthalmic ROS: negative  ENT ROS: negative  Allergy and Immunology ROS: negative  Hematological and Lymphatic ROS: negative  Endocrine ROS: positive for - elevated blood sugar  Breast ROS: negative for breast lumps  Respiratory ROS: no cough, shortness of breath, or wheezing  Cardiovascular ROS: no chest pain or dyspnea on exertion  Gastrointestinal ROS: no abdominal pain, change in bowel habits, or black or bloody stools  Genito-Urinary ROS: no dysuria, trouble voiding, or hematuria  Musculoskeletal ROS: negative    Current Meds:    Prior to Admission medications    Medication Sig Start Date End Date Taking? Authorizing Provider   ranitidine (ZANTAC) 150 MG capsule Take 150 mg by mouth 2 times daily   Yes Historical Provider, MD   traMADol (ULTRAM) 50 MG tablet Take 50 mg by mouth every 8 hours as needed for Pain    Yes Historical Provider, MD   famotidine (PEPCID) 20 MG tablet Take 20 mg by mouth 2 times daily   Yes Historical Provider, MD   Multiple Vitamins-Minerals (THERAPEUTIC MULTIVITAMIN-MINERALS) tablet Take 1 tablet by mouth daily   Yes Historical Provider, MD   linaclotide (LINZESS) 290 MCG CAPS capsule Take 1 capsule  by mouth every morning (before breakfast) 12/30/13  Yes Percival Spanish, APRN   QUEtiapine (SEROQUEL) 50 MG tablet Take 400 mg by mouth nightly    Yes Historical Provider, MD   traZODone (DESYREL) 150 MG tablet Take 100 mg by mouth nightly    Yes Historical Provider, MD   buPROPion SR (WELLBUTRIN SR) 150 MG SR tablet Take 150 mg by mouth daily    Yes Historical Provider, MD   lidocaine-prilocaine (EMLA) 2.5-2.5 % cream Apply topically as needed for Pain Apply topically as needed.    Historical Provider, MD   Cholecalciferol (VITAMIN D3) 50000 UNITS CAPS Take 50,000 Units by mouth once a week Take on Sunday for 11 weeks. Started on 12/15/13    Historical Provider, MD   OXcarbazepine (TRILEPTAL) 150 MG tablet Take 150 mg by mouth 2 times daily    Historical Provider, MD   polyethylene glycol (GLYCOLAX) powder Take 17 g by mouth daily    Historical Provider, MD       @    MSE:  Patient is  A & O x3.  Appearance:  well-appearing  Cognition:  Recent memory intact , remote memory intact , good fund of knowledge,  average intelligence level.   Speech:  normal  Language: Naming: intact; Word Finding: intact  Conversation no evidence of delusions  Behavior:  Cooperative  Mood: depressed  Affect: congruent with mood  Thought Content: negative no psychosis  Thought Process: goal directed  Judgement Insight:  fair and fair  Gait and Station:normal gait and station   Musculoskeletal: no issues today    Assesment:   1. Suicidal ideations    2. Hyperglycemia        Plan:  1. The risks, benefits, side effects, indications, contraindications, and adverse effects of the medications have been discussed.  2. The pt has verbalized understanding and has capacity to give informed consent.  3. The Zadie Rhine report has been reviewed according to [facility] regulations.  4. Supportive therapy offered.       Orders Placed This Encounter   Medications   ??? insulin regular (HUMULIN R;NOVOLIN R) injection 10 Units     Sig:    ??? oxyCODONE-acetaminophen  (PERCOCET) 5-325 MG per tablet 1 tablet     Sig:    ??? OLANZapine (ZYPREXA) tablet 10 mg     Sig:    ??? LORazepam (ATIVAN) tablet 0.5 mg     Sig:    ??? DISCONTD: insulin lispro (HUMALOG) injection vial 0-6 Units     Sig:    ??? acetaminophen (TYLENOL) tablet 650 mg     Sig:    ??? DISCONTD: magnesium hydroxide (MILK OF MAGNESIA) 400 MG/5ML suspension 30 mL     Sig:      PO BID PRN for Constipation   ??? traZODone (DESYREL) tablet 50 mg     Sig:    ??? cloNIDine (CATAPRES) tablet 0.1 mg     Sig:    ??? glucose (GLUTOSE) 40 % oral gel 15 g     Sig:    ??? DISCONTD: dextrose 50 % solution 12.5 g     Sig:    ??? glucagon (rDNA) injection 1 mg     Sig:    ??? DISCONTD: dextrose 5 % solution     Sig:    ??? magnesium hydroxide (MILK OF MAGNESIA) 400 MG/5ML suspension 30 mL     Sig:      PO BID PRN for Constipation   ??? insulin lispro (HUMALOG) injection vial 0-6 Units     Sig:    ??? DISCONTD: metFORMIN (GLUCOPHAGE) tablet 500 mg     Sig:    ??? buPROPion SR (WELLBUTRIN SR) SR tablet 150 mg     Sig:    ??? vitamin D (ERGOCALCIFEROL) capsule 50,000 Units     Sig:    ??? famotidine (PEPCID) tablet 20 mg     Sig:    ??? lidocaine-prilocaine (EMLA) cream     Sig:    ??? linaclotide (LINZESS) capsule 290 mcg     Sig:    ??? therapeutic multivitamin-minerals 1 tablet     Sig:    ??? OXcarbazepine (TRILEPTAL) tablet 150 mg     Sig:    ??? polyethylene glycol (GLYCOLAX) packet 17 g     Sig:    ??? QUEtiapine (SEROQUEL) tablet 400 mg     Sig:    ??? DISCONTD: famotidine (PEPCID) tablet 20 mg     Sig:    ??? traMADol (ULTRAM) tablet 50 mg     Sig:    ??? traZODone (DESYREL) tablet 100 mg     Sig:    ??? metFORMIN (GLUCOPHAGE) tablet 1,000 mg     Sig:    ??? fish oil capsule 1,000 mg     Sig:    ??? glipiZIDE (GLUCOTROL) tablet 10 mg     Sig:  Orders Placed This Encounter   Procedures   ??? CBC     Standing Status: Standing      Number of Occurrences: 1      Standing Expiration Date:    ??? Ethanol     Standing Status: Standing      Number of Occurrences: 1      Standing Expiration  Date:    ??? Comprehensive Metabolic Panel     Standing Status: Standing      Number of Occurrences: 1      Standing Expiration Date:    ??? Urinalysis     Standing Status: Standing      Number of Occurrences: 1      Standing Expiration Date:    ??? Urine Drug Screen     Standing Status: Standing      Number of Occurrences: 1      Standing Expiration Date:    ??? Hemoglobin A1C     Standing Status: Standing      Number of Occurrences: 1      Standing Expiration Date:    ??? LDL Cholesterol, Direct     Standing Status: Standing      Number of Occurrences: 1      Standing Expiration Date:    ??? TSH without Reflex     Standing Status: Standing      Number of Occurrences: 1      Standing Expiration Date:    ??? Vitamin D 25 Hydroxy     Standing Status: Standing      Number of Occurrences: 1      Standing Expiration Date:    ??? DIET CARB CONTROL; Carb Control: 4 carbs/meal (approximate 1800 kcals/day)     Standing Status: Standing      Number of Occurrences: 1      Standing Expiration Date:      Order Specific Question:  Carb Control     Answer:  4 carbs/meal (approximate 1800 kcals/day)   ??? Flight risk     Standing Status: Standing      Number of Occurrences: 1      Standing Expiration Date:    ??? Vital signs per unit routine     Standing Status: Standing      Number of Occurrences: 1      Standing Expiration Date:    ??? Notify physician     Notify physician for pulse less than 50 or greater than 120, respiratory rate less than 12 or greater than 25, oral temperature greater than 101.3 F (38.5 C) , urinary output less than 120 mL in four hours, systolic BP less than 90 or greater than 170, diastolic BP less than 50 or greater than 100.     Standing Status: Standing      Number of Occurrences: 1      Standing Expiration Date:    ??? Notify physician     Upon patient's arrival to the unit, please contact the admitting provider for completion of medication reconciliation and comprehensive orders.     Standing Status: Standing      Number of  Occurrences: 1      Standing Expiration Date:    ??? Notify physician (specify)     Verify home medications with [facility]     Standing Status: Standing      Number of Occurrences: 1      Standing Expiration Date:    ??? Height and weight     Standing Status:  Standing      Number of Occurrences: 1      Standing Expiration Date:    ??? HYPOGLYCEMIA TREATMENT: blood glucose less than 50 mg/dL and patient  ALERT and TOLERATING PO     Give 8 ounces juice or regular soda or 2 tubes glucose gel. Repeat blood glucose in 15 minutes. If blood glucose is less than 70 mg/dL, repeat treatment and recheck blood glucose in 15 minutes x2 and notify provider.     Standing Status: Standing      Number of Occurrences: 99999      Standing Expiration Date:    ??? HYPOGLYCEMIA TREATMENT: blood glucose less than 70 mg/dL and patient ALERT and TOLERATING PO     Give 4 ounces juice or regular soda or 1 tube glucose gel. Repeat blood glucose in 15 minutes. If blood glucose is less than 70 mg/dL, repeat treatment and recheck blood glucose in 15 minutes x2. If blood glucose remains less than 70 mg/dL, notify provider.     Standing Status: Standing      Number of Occurrences: 99999      Standing Expiration Date:    ??? HYPOGLYCEMIA TREATMENT: blood glucose less than 70 mg/dL and patient NOT ALERT or NPO     Give dextrose 50% intravenous. If patient does not respond within 5 minutes, repeat dose x1.  Start D5W at 100 mL/hour until ordering provider can be reached. Repeat blood glucose in 15 minutes. If blood glucose is less than 70 mg/dL, repeat treatment and recheck blood glucose in 15 minutes x2.  Notify provider. If no intravenous access, administer glucagon 1 mg. After administration, attempt intravenous access and start D5W at 100 mL/hr. Repeat blood glucose in 15 minutes x2 and notify provider.     Standing Status: Standing      Number of Occurrences: 99999      Standing Expiration Date:    ??? Full Code     Standing Status: Standing       Number of Occurrences: 1      Standing Expiration Date:    ??? Inpatient consult to Psychiatry     Standing Status: Standing      Number of Occurrences: 1      Standing Expiration Date:      Order Specific Question:  Reason for Consult?     Answer:  SI   ??? Inpatient consult to Social Work     Standing Status: Standing      Number of Occurrences: 1      Standing Expiration Date:      Order Specific Question:  Reason for Consult?     Answer:  Per unit policy   ??? Inpatient consult to Internal Medicine     Standing Status: Standing      Number of Occurrences: 1      Standing Expiration Date:      Order Specific Question:  Reason for Consult?     Answer:  H & P   ??? Inpatient consult to Spiritual Services     Standing Status: Standing      Number of Occurrences: 1      Standing Expiration Date:      Order Specific Question:  Reason for Consult?     Answer:  pt requested   ??? POCT Glucose     Standing Status: Standing      Number of Occurrences: 25      Standing Expiration Date:    ??? POCT glucose  If blood sugar is below 110 at bedtime, then check blood glucose at 0200.     Standing Status: Standing      Number of Occurrences: 28      Standing Expiration Date:    ??? POCT Glucose     Repeat blood glucose 15 minutes following intervention.  If blood glucose is less than 70 mg/dL, repeat treatment and recheck blood glucose in 15 minutes x2.  If blood glucose remains less than 70 mg/dL, call ordering provider for further instruction.   If patient experienced a hypoglycemic event in last 24 hours, obtain blood glucose at 0200.     Standing Status: Standing      Number of Occurrences: 99999      Standing Expiration Date:    ??? POCT Glucose     Standing Status: Standing      Number of Occurrences: 1      Standing Expiration Date:    ??? POCT Glucose     Standing Status: Standing      Number of Occurrences: 1      Standing Expiration Date:    ??? POCT Glucose     Standing Status: Standing      Number of Occurrences: 1      Standing  Expiration Date:    ??? POCT Glucose     Standing Status: Standing      Number of Occurrences: 1      Standing Expiration Date:    ??? POCT Glucose     Standing Status: Standing      Number of Occurrences: 1      Standing Expiration Date:    ??? POCT Glucose     Standing Status: Standing      Number of Occurrences: 1      Standing Expiration Date:    ??? POCT Glucose     Standing Status: Standing      Number of Occurrences: 1      Standing Expiration Date:    ??? PATIENT STATUS (DIRECT) Inpatient     Standing Status: Standing      Number of Occurrences: 1      Standing Expiration Date:      Order Specific Question:  Patient Class     Answer:  Inpatient [101]     Order Specific Question:  REQUIRED: Diagnosis     Answer:  Suicidal behavior [[id number]]     Order Specific Question:  Estimated Length of Stay     Answer:  Estimated stay of more than 2 midnights     Order Specific Question:  Future Attending Provider     Answer:  Jeanene ErbBALLEW, Bently Wyss KAY [[id number]]     Order Specific Question:  Admitting Provider     Answer:  Jeanene ErbBALLEW, Momoko Slezak KAY [[id number]]     Order Specific Question:  Telemetry Bed Required?     Answer:  No   ??? PATIENT STATUS (FROM ED OR OR/PROCEDURAL) Inpatient     Standing Status: Standing      Number of Occurrences: 1      Standing Expiration Date:      Order Specific Question:  Patient Class     Answer:  Inpatient [101]     Order Specific Question:  REQUIRED: Diagnosis     Answer:  Suicidal ideations [[id number]]     Order Specific Question:  Estimated Length of Stay     Answer:  Estimated stay of more than 2 midnights     Order Specific Question:  Future Attending Provider     Answer:  Jeanene Erb [[id number]]     Order Specific Question:  Admitting Provider     Answer:  Jeanene Erb [[id number]]     Order Specific Question:  Telemetry Bed Required?     Answer:  No   ??? Suicide precautions     Standing Status: Standing      Number of Occurrences: 1      Standing Expiration Date:        10. Additional comments:    ??  Pt  continues to attend groups and to work on positive coping skills when she is not in the hospital as a safety net. Estimated length of stay 2 to 3 days from today.

## 2014-02-04 NOTE — Plan of Care (Signed)
Problem: Altered Mood, Depressive Behavior  Goal: STG-Knowledge of positive coping patterns  Outcome: Ongoing                                                                      Group Therapy Note    Date: 02/04/2014  Start Time: 1530  End Time:  1600  Number of Participants: 1    Type of Group: Relapse Prevention    Wellness Binder Information  Module Name:  Crafts  Session Number:      Patient's Goal:      Notes:  Pt was an active participant in group discussion pertaining to the importance of practicing positive coping skills to prevent relapse.    Status After Intervention:  Improved    Participation Level: Active Listener and Interactive    Participation Quality: Appropriate, Attentive and Sharing      Speech:  normal      Thought Process/Content: Logical      Affective Functioning: Congruent      Mood: depressed      Level of consciousness:  Alert and Oriented x4      Response to Learning: Able to verbalize current knowledge/experience, Able to verbalize/acknowledge new learning, Able to retain information and Able to change behavior      Endings:     Modes of Intervention: Education      Discipline Responsible: Psychoeducational Specialist      Signature:  Clydell Hakim

## 2014-02-05 LAB — POCT GLUCOSE
POC Glucose: 184 mg/dl — ABNORMAL HIGH (ref 70–99)
POC Glucose: 237 mg/dl — ABNORMAL HIGH (ref 70–99)
POC Glucose: 240 mg/dl — ABNORMAL HIGH (ref 70–99)
POC Glucose: 216 mg/dl — ABNORMAL HIGH (ref 70–99)

## 2014-02-05 MED FILL — FAMOTIDINE 20 MG PO TABS: 20 MG | ORAL | Qty: 1

## 2014-02-05 MED FILL — TRAMADOL HCL 50 MG PO TABS: 50 MG | ORAL | Qty: 1

## 2014-02-05 MED FILL — MIRALAX 17 G PO PACK: 17 g | ORAL | Qty: 1

## 2014-02-05 MED FILL — ABC PLUS SENIOR PO TABS: ORAL | Qty: 1

## 2014-02-05 MED FILL — QUETIAPINE FUMARATE 100 MG PO TABS: 100 MG | ORAL | Qty: 4

## 2014-02-05 MED FILL — GLIPIZIDE 5 MG PO TABS: 5 MG | ORAL | Qty: 2

## 2014-02-05 MED FILL — OXCARBAZEPINE 150 MG PO TABS: 150 MG | ORAL | Qty: 1

## 2014-02-05 MED FILL — FISH OIL 1000 MG PO CAPS: 1000 MG | ORAL | Qty: 1

## 2014-02-05 MED FILL — BUPROPION HCL ER (SR) 150 MG PO TB12: 150 MG | ORAL | Qty: 1

## 2014-02-05 MED FILL — TRAZODONE HCL 100 MG PO TABS: 100 MG | ORAL | Qty: 1

## 2014-02-05 MED FILL — METFORMIN HCL 500 MG PO TABS: 500 MG | ORAL | Qty: 2

## 2014-02-05 MED FILL — LINZESS 145 MCG PO CAPS: 145 MCG | ORAL | Qty: 2

## 2014-02-05 MED FILL — TYLENOL 325 MG PO TABS: 325 MG | ORAL | Qty: 2

## 2014-02-05 NOTE — Progress Notes (Signed)
PATIENT SITTING IN DINING ROOM. PATIENT CLEAN, GROOMED, ECT. CASUAL DRESSED. ACTION 1:1 ASSESSMENT COMPLETED X10 MINUTES. MEDICATION REGIMEN FOLLOWED. VS 98%RA, PULSE 93, TEMP. 97.9, BP 116/78, RESP. 18. RESPONSE) PATIENT SPEECH CLEAR. PATIENT MOTOR WNL. PATIENT ADLS WNL. PATIENT APPETITE WNL. PATIENT SLEEP PATTERN WNL. PATIENT BEHAVIOR APPROPRIATE. PATIENT AFFECT CALM ECT. BRIGHT. PATIENT SOCIAL. PATIENT DENIES SI, HI, AVH. PATIENT ATTENDS ECT. PARTICIPATES WITH GROUPS. PATIENT RATES DEPRESSION 0. ANXIETY 0. PATIENT PLEASANT ECT. COOPERATIVE WITH STAFF.

## 2014-02-05 NOTE — Progress Notes (Signed)
Pt seated in day use area  Completed LPN assessment in 20 minutes  Pt states she has had a quiet day. After breakfast, she went back to bed, and slept approx 1.5 hours.   She has done craft and read today, and had an occasional snack.   She reports some chest congestion. Her GLU was 216 @ 1619, for which I gave her 2 units of Humalog Insulin.  She slept well, attended group, ate all, rated pain of 4 in her Inguinal nerve, which she states as tolerable. Her gait is steady, she is social, her affect is calm and agreeable, but subdued this shift, since she needs more intellectual stimulation. She has done ADLs. She reports depression @ 0, anxiety @ 0, no SI, HI, or AVH. She has been cooperative this shift.

## 2014-02-05 NOTE — Progress Notes (Signed)
Data:  Patient resting in bed at this time with eyes closed.  Action:  Assessment completed and vital signs obtained.   Response:  Patient has slept well during the night. Denies any pain or discomfort at this time.  Vital signs within normal limits.  Rated depression 0, anxiety 0, and denies SI, HI, and AVH.  Patient mood brighter today.  Had visit previous evening from husband.

## 2014-02-05 NOTE — Plan of Care (Signed)
Problem: Altered Mood, Depressive Behavior  Goal: STG-Knowledge of positive coping patterns  Pt receptive to education provided regarding positive coping patterns. Needs continued reinforcement to utilize coping skills.    Problem: Health Behavior:  Intervention: Encourage verbalization of feelings  Pt verbalizes feelings.  Intervention: Use therapeutic communication skills to develop a trusting relationship  Therapeutic communication used in all interactions with pt.  Intervention: Patient observation  Pt within eyesight continually.

## 2014-02-05 NOTE — Progress Notes (Signed)
Group Therapy Note    Date: 02/05/2014   Start Time: 13:11  End Time:  14:28   Number of Participants: 1    Type of Group: Recreational    Wellness Binder Information  Module Name:  Vernie Shanksillow Craft  Session Number:      Patient's Goal:  Engage in productive recreational activity.    Notes:  Pt eager to participate.    Status After Intervention:  Improved    Participation Level: Active Listener and Interactive    Participation Quality: Appropriate and Attentive      Speech:  normal      Thought Process/Content: Logical      Affective Functioning: Congruent      Mood: Happy      Level of consciousness:  Alert, Oriented x4 and Attentive      Response to Learning: Able to verbalize/acknowledge new learning and Progressing to goal      Endings: None Reported    Modes of Intervention: Support, Socialization and Activity      Discipline Responsible: Registered Nurse      Signature:  Leavy CellaMichelle Braeden Dolinski, RN

## 2014-02-05 NOTE — Plan of Care (Signed)
Problem: Altered Mood, Depressive Behavior  Goal: LTG-Able to verbalize and/or display a decrease in depressive symptoms  Outcome: Ongoing  Pt verbalize feeling this shift  Goal: STG-Able to verbalize suicidal ideations  Outcome: Ongoing  Pt verbalizes no suicidal thoughts at this time  Goal: LTG-Absence of self-harm  Outcome: Ongoing  Pt verbalizes no thoughts of self harm at this time  Goal: STG-Knowledge of positive coping patterns  Outcome: Ongoing  Pt verbalizes coping skills that are constructive for her at her home settings that relieves stress and she stated that she has learned new coping techniques from staff.    Problem: Health Behavior:  Intervention: Encourage verbalization of feelings  Pt verbalizes feeling, ideas and thoughts to staff   Intervention: Use therapeutic communication skills to develop a trusting relationship  Staff uses therapeutic communication and comfort measures to enhance trust with patient  Intervention: Patient observation  Pt verbalizing feeling, events, past, interests and child hood experiences with staff. Pt attending groups, social with staff, performed ADL's , affect is appropriate, behavior is calm, pleasant and cooperative. Pt constructive with crafts, reading, groups, and social with staff    Goal: Ability to verbalize adaptive coping strategies to use when suicidal feelings occur will improve  Ability to verbalize adaptive coping strategies to use when suicidal feelings occur will improve   Outcome: Ongoing  Pt verbalizes no feelings of suicidal feeling at this time and she states that she was feeling bad due to her blood sugar being so elevated and learning how to take better care of her self and her diet she should not feeling of self harm.

## 2014-02-05 NOTE — Progress Notes (Signed)
02/05/2014 5:28 PM   Progress Note        Joyce Cohen August 06, 1955  Psychotherapy Time Spent: 10 min      Psychotherapy Topics: Adjusting to diabetes    Chief Complaint   Patient presents with   ??? Psychiatric Evaluation       Subjective:  I feel lots better    Patient reports side effects as follows: none.  Reports no si .  Reports compliance with medications as compliant all of the time.     Review of Systems - Psychological ROS: positive for - anxiety    Current Meds:    Prior to Admission medications    Medication Sig Start Date End Date Taking? Authorizing Provider   ranitidine (ZANTAC) 150 MG capsule Take 150 mg by mouth 2 times daily   Yes Historical Provider, MD   traMADol (ULTRAM) 50 MG tablet Take 50 mg by mouth every 8 hours as needed for Pain    Yes Historical Provider, MD   famotidine (PEPCID) 20 MG tablet Take 20 mg by mouth 2 times daily   Yes Historical Provider, MD   Multiple Vitamins-Minerals (THERAPEUTIC MULTIVITAMIN-MINERALS) tablet Take 1 tablet by mouth daily   Yes Historical Provider, MD   linaclotide (LINZESS) 290 MCG CAPS capsule Take 1 capsule by mouth every morning (before breakfast) 12/30/13  Yes Percival SpanishKristie Hack, APRN   QUEtiapine (SEROQUEL) 50 MG tablet Take 400 mg by mouth nightly    Yes Historical Provider, MD   traZODone (DESYREL) 150 MG tablet Take 100 mg by mouth nightly    Yes Historical Provider, MD   buPROPion SR (WELLBUTRIN SR) 150 MG SR tablet Take 150 mg by mouth daily    Yes Historical Provider, MD   lidocaine-prilocaine (EMLA) 2.5-2.5 % cream Apply topically as needed for Pain Apply topically as needed.    Historical Provider, MD   Cholecalciferol (VITAMIN D3) 50000 UNITS CAPS Take 50,000 Units by mouth once a week Take on Sunday for 11 weeks. Started on 12/15/13    Historical Provider, MD   OXcarbazepine (TRILEPTAL) 150 MG tablet Take 150 mg by mouth 2 times daily    Historical Provider, MD   polyethylene glycol (GLYCOLAX) powder Take 17 g by mouth daily    Historical  Provider, MD     Current facility-administered medications:metFORMIN (GLUCOPHAGE) tablet 1,000 mg, 1,000 mg, Oral, BID WC  fish oil capsule 1,000 mg, 1,000 mg, Oral, BID  glipiZIDE (GLUCOTROL) tablet 10 mg, 10 mg, Oral, QAM AC  magnesium hydroxide (MILK OF MAGNESIA) 400 MG/5ML suspension 30 mL, 30 mL, Oral, BID PRN  insulin lispro (HUMALOG) injection vial 0-6 Units, 0-6 Units, Subcutaneous, 4x Daily PRN  buPROPion SR (WELLBUTRIN SR) SR tablet 150 mg, 150 mg, Oral, Daily  [START ON 02/06/2014] vitamin D (ERGOCALCIFEROL) capsule 50,000 Units, 50,000 Units, Oral, Weekly  famotidine (PEPCID) tablet 20 mg, 20 mg, Oral, BID  lidocaine-prilocaine (EMLA) cream, , Topical, PRN  linaclotide (LINZESS) capsule 290 mcg, 290 mcg, Oral, QAM AC  therapeutic multivitamin-minerals 1 tablet, 1 tablet, Oral, Daily  OXcarbazepine (TRILEPTAL) tablet 150 mg, 150 mg, Oral, BID  polyethylene glycol (GLYCOLAX) packet 17 g, 17 g, Oral, Daily  QUEtiapine (SEROQUEL) tablet 400 mg, 400 mg, Oral, Nightly  traMADol (ULTRAM) tablet 50 mg, 50 mg, Oral, Q8H PRN  traZODone (DESYREL) tablet 100 mg, 100 mg, Oral, Nightly  LORazepam (ATIVAN) tablet 0.5 mg, 0.5 mg, Oral, Q6H PRN  acetaminophen (TYLENOL) tablet 650 mg, 650 mg, Oral, Q4H PRN  traZODone (DESYREL)  tablet 50 mg, 50 mg, Oral, Nightly PRN  cloNIDine (CATAPRES) tablet 0.1 mg, 0.1 mg, Oral, Q4H PRN  glucose (GLUTOSE) 40 % oral gel 15 g, 15 g, Oral, PRN  glucagon (rDNA) injection 1 mg, 1 mg, Intramuscular, PRN    MSE:  Patient is  A & O x3.  Appearance:  in chair  Cognition:  Recent memory intact , remote memory intact , good fund of knowledge, above average intelligence level.   Speech:  normal  Behavior:  Cooperative  Mood: euthymic  Affect: congruent with mood  Thought Content: no evidence of delusions  Thought Process: linear, goal directed and coherent  Judgement Insight:  normal and appropriate  Gait and Station:normal gait and station   Neuromuscular: normal    Assesment:   Active Hospital  Problems    Diagnosis Date Noted   ??? Major depression, recurrent, chronic (HCC) [F33.9] 02/03/2014     Priority: High   ??? Hypercholesteremia [E78.0] 02/04/2014   ??? Vitamin D deficiency [E55.9] 02/04/2014   ??? Suicidal ideations [R45.851]    ??? Obesity (BMI 30-39.9) [E66.9] 02/03/2014   ??? MVP (mitral valve prolapse) [I34.1] 02/03/2014   ??? Type 2 diabetes mellitus (HCC) [E11.9] 02/03/2014   ??? Right lumbar radiculopathy [M54.16] 02/03/2014   ??? Chronic constipation [K59.00] 02/03/2014         Plan:  1. Continue precautions, protocols as ordered.  2. Initiate, continue, adjust medications as ordered.  3. Individual / group therapies, including coping skill development and hope building strategies  4. Ongoing discharge and aftercare planning.  5. Discussed support available from her church.    Laymond Purseravid A Marzell Allemand, MD

## 2014-02-05 NOTE — Progress Notes (Addendum)
Group Therapy Note    Date: 02/05/2014  Start Time: 1400  End Time:  1445  Number of Participants: 1    Type of Group: Health Living/Wellness    Wellness Binder Information  Module Name:  Tips on Positive Thinking  Session Number:      Patient's Goal: Identify methods to reduce negative thoughts and increase positive thoughts.    Notes: Pt actively listened and discussed topic.     Status After Intervention:  Unchanged    Participation Level: Active Listener and Interactive    Participation Quality: Appropriate, Attentive and Sharing      Speech:  normal      Thought Process/Content: Logical      Affective Functioning: Blunted      Mood: depressed      Level of consciousness:  Alert, Oriented x4 and Attentive      Response to Learning: Able to verbalize current knowledge/experience      Endings: None Reported    Modes of Intervention: Education, Support, Socialization, Dentistroblem-solving and Activity      Discipline Responsible: Registered Nurse      Signature:  Leavy CellaMichelle Marva Hendryx, RN

## 2014-02-05 NOTE — Plan of Care (Signed)
Problem: Altered Mood, Depressive Behavior  Goal: LTG-Able to verbalize and/or display a decrease in depressive symptoms  Outcome: Ongoing  Goal: STG-Able to verbalize suicidal ideations  Pt states no occurrence this shift  Goal: LTG-Absence of self-harm  Pt has demonstrated self care, not harm this shift  Goal: STG-Knowledge of positive coping patterns  Pt expresses concerns, and desires understanding of how to deal with them    Problem: Health Behavior:  Intervention: Encourage verbalization of feelings  Pt shares feelings with nursing staff  Intervention: Use therapeutic communication skills to develop a trusting relationship  I have spent time developing trust with the pt  Intervention: Patient observation  Pt is in a positive state of mind, and is receptive and communicative this shift    Goal: Ability to verbalize adaptive coping strategies to use when suicidal feelings occur will improve  Ability to verbalize adaptive coping strategies to use when suicidal feelings occur will improve   Pt plans to discuss availability of resources with social worker

## 2014-02-06 LAB — POCT GLUCOSE
POC Glucose: 276 mg/dl — ABNORMAL HIGH (ref 70–99)
POC Glucose: 236 mg/dl — ABNORMAL HIGH (ref 70–99)
POC Glucose: 215 mg/dl — ABNORMAL HIGH (ref 70–99)
POC Glucose: 161 mg/dl — ABNORMAL HIGH (ref 70–99)

## 2014-02-06 MED ORDER — INSULIN GLARGINE 100 UNIT/ML SC SOLN
100 UNIT/ML | Freq: Every evening | SUBCUTANEOUS | Status: DC
Start: 2014-02-06 — End: 2014-02-07
  Administered 2014-02-07: 03:00:00 12 [IU] via SUBCUTANEOUS

## 2014-02-06 MED FILL — ABC PLUS SENIOR PO TABS: ORAL | Qty: 1

## 2014-02-06 MED FILL — VITAMIN D (ERGOCALCIFEROL) 1.25 MG (50000 UT) PO CAPS: 1.25 MG (50000 UT) | ORAL | Qty: 1

## 2014-02-06 MED FILL — MIRALAX 17 G PO PACK: 17 g | ORAL | Qty: 1

## 2014-02-06 MED FILL — BUPROPION HCL ER (SR) 150 MG PO TB12: 150 MG | ORAL | Qty: 1

## 2014-02-06 MED FILL — FAMOTIDINE 20 MG PO TABS: 20 MG | ORAL | Qty: 1

## 2014-02-06 MED FILL — OXCARBAZEPINE 150 MG PO TABS: 150 MG | ORAL | Qty: 1

## 2014-02-06 MED FILL — LINZESS 145 MCG PO CAPS: 145 MCG | ORAL | Qty: 2

## 2014-02-06 MED FILL — LANTUS 100 UNIT/ML SC SOLN: 100 UNIT/ML | SUBCUTANEOUS | Qty: 0.12

## 2014-02-06 MED FILL — FISH OIL 1000 MG PO CAPS: 1000 MG | ORAL | Qty: 1

## 2014-02-06 MED FILL — METFORMIN HCL 500 MG PO TABS: 500 MG | ORAL | Qty: 2

## 2014-02-06 MED FILL — TRAMADOL HCL 50 MG PO TABS: 50 MG | ORAL | Qty: 1

## 2014-02-06 MED FILL — TRAZODONE HCL 100 MG PO TABS: 100 MG | ORAL | Qty: 1

## 2014-02-06 MED FILL — GLIPIZIDE 5 MG PO TABS: 5 MG | ORAL | Qty: 2

## 2014-02-06 MED FILL — QUETIAPINE FUMARATE 100 MG PO TABS: 100 MG | ORAL | Qty: 4

## 2014-02-06 NOTE — Plan of Care (Signed)
Problem: Altered Mood, Depressive Behavior  Goal: LTG-Able to verbalize and/or display a decrease in depressive symptoms  Outcome: Ongoing  Pt verbalizes denial of depression this shift  Goal: STG-Able to verbalize suicidal ideations  Outcome: Ongoing  Pt verbally denies suicidal ideations or thoughts  Goal: LTG-Absence of self-harm  Outcome: Ongoing  Pt verbally denies ideations of self harm  Goal: STG-Knowledge of positive coping patterns  Outcome: Ongoing   Pt verbalized positive coping skills and what she will do in home setting    Problem: Health Behavior:  Intervention: Encourage verbalization of feelings  Pt verbalizes feelings, ideas, and past experiences this shift  Intervention: Use therapeutic communication skills to develop a trusting relationship  Staff demonstrated therapeutic techniques to empower trust with patient (listening, encouragement)  Intervention: Patient observation  Pt was up at lib on unit, making water color art painting, reading, socializing, performing ADL, converses with staff and peers with feeling, ideas, goals, art, history, current events, past experience. Pt social, participated in group conversations, eats well, attentive to new diabetic diet, takes her medications, and denies depression, anxiety, SI, HI and AVH this evening. Affect is calm, bright,pleasant and appropriate, Behavior is calm and cooperative    Goal: Ability to verbalize adaptive coping strategies to use when suicidal feelings occur will improve  Ability to verbalize adaptive coping strategies to use when suicidal feelings occur will improve   Outcome: Ongoing  Pt verbalized strategies of coping for suicidal feelings

## 2014-02-06 NOTE — Progress Notes (Signed)
Patient sitting on side patient bed in patient room. Patient clean, groomed, ect. Casual dressed. Action 1:1 assessment completed x10 minutes. Medication regimen followed. VS BP 115/ 78. Pulse 92. Temp 97.8. Resp. 18. O2 95%RA. Response) Patient speech clear. Patient motor WNL. Patient ADLs WNL. Patient ADLs independent. Patient sleep pattern WNL. Patient appetite WNL. Patient behavior appropriate. Patient mood calm. Patient affect calm, bright, ect. Pleasant. Patient denies SI, HI, AVH. Patient social with peers on unit. Patient attends ect. Participates in groups. Patient cooperative ect. Pleasant with staff. Patient rates anxiety 0. Patient rates depression 0.

## 2014-02-06 NOTE — Progress Notes (Signed)
Patient alert and oriented, able to voice needs. Skin warm and dry. Patient lying in bed with eyes closed, answers questions appropiately.  Vital signs obtained and interview.  Patient denies depression, anxiety, SI, HI, AVH. Patient sleeps well, eats, attends group, social with staff, completes ADL's. Pt continues to have an eyesight order. No problems voiced this shift.

## 2014-02-06 NOTE — Progress Notes (Signed)
Nursing Progress Note    Data: pt up at lib on unit, at this time patient lying in bed reading book, dressed in casual clothing    Action: 1:1 assessment x15 min, medications review and given without difficulty    Response: Pt was up at lib on unit, making water color art painting, reading, socializing, performing ADL, converses with staff and peers with feeling, ideas, goals, art, history, current events, past experience. Pt social, participated in group conversations, eats well, attentive to new diabetic diet, takes her medications, and denies depression, anxiety, SI, HI and AVH this evening. Affect is calm, bright,pleasant and appropriate, Behavior is calm and cooperative. Pt rated depression 0 and anxiety 0, pt stated that she is ready to go home. Pt had visitor / husband this evening. BS at 1600 was 161, supper eaten 100%, VS stable, pain of pelvic pain was a 6, pt stated that pain is tolerable and able to perform daily ADL.

## 2014-02-06 NOTE — Progress Notes (Signed)
Group Therapy Note    Date: 02/06/2014  Start Time:2100  End Time:  2200  Number of Participants: 1    Type of Group: Discharge goals    Wellness Binder Information  Module Name:    Session Number:      Patient's Goal:  To resume working as a Surveyor, mineralstheatrical costumer    Notes:  none    Status After Intervention:  Positive     Participation Level: high    Participation Quality: high      Speech:  clear      Thought Process/Content: relevant      Affective Functioning: WDL      Mood: calm,       Level of consciousness:  alert      Response to Learning: receptive      Endings: none    Modes of Intervention: verbal instruction      Discipline Responsible: nursing      Signature:  Reece Agaravid B Malakye Nolden, LPN

## 2014-02-06 NOTE — Plan of Care (Signed)
Problem: Altered Mood, Depressive Behavior  Goal: LTG-Able to verbalize and/or display a decrease in depressive symptoms  Outcome: Ongoing  Pt verbalizes decrease depression and denies depression at this time  Goal: STG-Able to verbalize suicidal ideations  Outcome: Ongoing  Pt verbalizes and denies suicidal ideations or thoughts  Goal: LTG-Absence of self-harm  Outcome: Ongoing  Pt denies thoughts or ideations of self harm to self.  Goal: STG-Knowledge of positive coping patterns  Outcome: Ongoing  Pt verbalizes coping skills that she will perform at home     Problem: Health Behavior:  Intervention: Encourage verbalization of feelings  Pt verbalizes feeling, ideas, interests, past family experiences with staff  Intervention: Use therapeutic communication skills to develop a trusting relationship  Use of calming voice, listedning    Goal: Ability to verbalize adaptive coping strategies to use when suicidal feelings occur will improve  Ability to verbalize adaptive coping strategies to use when suicidal feelings occur will improve   Outcome: Ongoing  Pt verbalizes ways she will cope when feeling of harming one self occur

## 2014-02-07 LAB — POCT GLUCOSE
POC Glucose: 167 mg/dl — ABNORMAL HIGH (ref 70–99)
POC Glucose: 191 mg/dl — ABNORMAL HIGH (ref 70–99)
POC Glucose: 147 mg/dl — ABNORMAL HIGH (ref 70–99)

## 2014-02-07 MED ORDER — METFORMIN HCL 1000 MG PO TABS
1000 MG | ORAL_TABLET | Freq: Two times a day (BID) | ORAL | Status: DC
Start: 2014-02-07 — End: 2014-07-27

## 2014-02-07 MED ORDER — LIDOCAINE-PRILOCAINE 2.5-2.5 % EX CREA
CUTANEOUS | Status: DC
Start: 2014-02-07 — End: 2014-06-13

## 2014-02-07 MED ORDER — FISH OIL 1000 MG PO CAPS
1000 MG | ORAL_CAPSULE | Freq: Two times a day (BID) | ORAL | Status: DC
Start: 2014-02-07 — End: 2014-02-28

## 2014-02-07 MED ORDER — LORAZEPAM 0.5 MG PO TABS
0.5 MG | ORAL_TABLET | Freq: Four times a day (QID) | ORAL | Status: DC | PRN
Start: 2014-02-07 — End: 2014-02-28

## 2014-02-07 MED ORDER — POLYETHYLENE GLYCOL 3350 17 GM/SCOOP PO POWD
17 GM/SCOOP | Freq: Every day | ORAL | Status: DC
Start: 2014-02-07 — End: 2014-02-28

## 2014-02-07 MED ORDER — INSULIN GLARGINE 100 UNIT/ML SC SOLN
100 UNIT/ML | Freq: Every evening | SUBCUTANEOUS | Status: DC
Start: 2014-02-07 — End: 2014-06-13

## 2014-02-07 MED ORDER — GLIPIZIDE 10 MG PO TABS
10 MG | ORAL_TABLET | Freq: Every day | ORAL | Status: DC
Start: 2014-02-07 — End: 2016-01-05

## 2014-02-07 MED FILL — ABC PLUS SENIOR PO TABS: ORAL | Qty: 1

## 2014-02-07 MED FILL — FISH OIL 1000 MG PO CAPS: 1000 MG | ORAL | Qty: 1

## 2014-02-07 MED FILL — QUETIAPINE FUMARATE 100 MG PO TABS: 100 MG | ORAL | Qty: 4

## 2014-02-07 MED FILL — TYLENOL 325 MG PO TABS: 325 MG | ORAL | Qty: 2

## 2014-02-07 MED FILL — MIRALAX 17 G PO PACK: 17 g | ORAL | Qty: 1

## 2014-02-07 MED FILL — LANTUS 100 UNIT/ML SC SOLN: 100 UNIT/ML | SUBCUTANEOUS | Qty: 0.12

## 2014-02-07 MED FILL — TRAZODONE HCL 100 MG PO TABS: 100 MG | ORAL | Qty: 1

## 2014-02-07 MED FILL — GLIPIZIDE 5 MG PO TABS: 5 MG | ORAL | Qty: 2

## 2014-02-07 MED FILL — BUPROPION HCL ER (SR) 150 MG PO TB12: 150 MG | ORAL | Qty: 1

## 2014-02-07 MED FILL — LINZESS 145 MCG PO CAPS: 145 MCG | ORAL | Qty: 2

## 2014-02-07 MED FILL — FAMOTIDINE 20 MG PO TABS: 20 MG | ORAL | Qty: 1

## 2014-02-07 MED FILL — OXCARBAZEPINE 150 MG PO TABS: 150 MG | ORAL | Qty: 1

## 2014-02-07 MED FILL — METFORMIN HCL 500 MG PO TABS: 500 MG | ORAL | Qty: 2

## 2014-02-07 NOTE — Progress Notes (Signed)
Joyce Cohen is a 58 y.o. female patient.    Current Facility-Administered Medications   Medication Dose Route Frequency Provider Last Rate Last Dose   ??? insulin glargine (LANTUS) injection vial 12 Units  12 Units Subcutaneous Nightly Ulanda EdisonJesse Yeshua Stryker, MD   12 Units at 02/06/14 2133   ??? metFORMIN (GLUCOPHAGE) tablet 1,000 mg  1,000 mg Oral BID WC Ulanda EdisonJesse Ryelynn Guedea, MD   1,000 mg at 02/06/14 1717   ??? fish oil capsule 1,000 mg  1,000 mg Oral BID Ulanda EdisonJesse Anella Nakata, MD   1,000 mg at 02/06/14 2108   ??? glipiZIDE (GLUCOTROL) tablet 10 mg  10 mg Oral QAM AC Ulanda EdisonJesse Razan Siler, MD   10 mg at 02/06/14 [id number]   ??? magnesium hydroxide (MILK OF MAGNESIA) 400 MG/5ML suspension 30 mL  30 mL Oral BID PRN Marcelina MorelLaurie Kay Ballew, DO       ??? insulin lispro (HUMALOG) injection vial 0-6 Units  0-6 Units Subcutaneous 4x Daily PRN Jeanene ErbLaurie Kay Ballew, DO   1 Units at 02/06/14 1719   ??? buPROPion SR [facility](WELLBUTRIN SR) SR tablet 150 mg  150 mg Oral Daily Marcelina MorelLaurie Kay Ballew, DO   150 mg at 02/06/14 [id number]   ??? vitamin D (ERGOCALCIFEROL) capsule 50,000 Units  50,000 Units Oral Weekly Jeanene ErbLaurie Kay Ballew, DO   50,000 Units at 02/06/14 [id number]   ??? famotidine (PEPCID) tablet 20 mg  20 mg Oral BID Marcelina MorelLaurie Kay Ballew, DO   20 mg at 02/06/14 2108   ??? lidocaine-prilocaine (EMLA) cream   Topical PRN Marcelina MorelLaurie Kay Ballew, DO       ??? linaclotide [facility](LINZESS) capsule 290 mcg  290 mcg Oral QAM AC Marcelina MorelLaurie Kay [place] East[place], DO   290 mcg at 02/06/14 [id number]   ??? therapeutic multivitamin-minerals 1 tablet  1 tablet Oral Daily Marcelina MorelLaurie Kay [place], DO   1 tablet at 02/06/14 [id number]   ??? OXcarbazepine (TRILEPTAL) tablet 150 mg  150 mg Oral BID Marcelina MorelLaurie Kay Ballew, DO   150 mg at 02/06/14 2108   ??? polyethylene glycol (GLYCOLAX) packet 17 g  17 g Oral Daily Marcelina MorelLaurie Kay Ballew, DO   17 g at 02/04/14 0854   ??? QUEtiapine (SEROQUEL) tablet 400 mg  400 mg Oral Nightly Marcelina MorelLaurie Kay Ballew, DO   400 mg at 02/06/14 2109   ??? traMADol (ULTRAM) tablet 50 mg  50 mg Oral Q8H PRN Jeanene ErbLaurie Kay Ballew, DO   50 mg at 02/05/14 2104   ???  traZODone (DESYREL) tablet 100 mg  100 mg Oral Nightly Marcelina MorelLaurie Kay [place], DO   100 mg at 02/06/14 2108   ??? LORazepam (ATIVAN) tablet 0.5 mg  0.5 mg Oral Q6H PRN Marcelina MorelLaurie Kay Ballew, DO   0.5 mg at 02/03/14 [id number]   ??? acetaminophen (TYLENOL) tablet 650 mg  650 mg Oral Q4H PRN Jeanene ErbLaurie Kay Ballew, DO   650 mg at 02/06/14 2108   ??? traZODone (DESYREL) tablet 50 mg  50 mg Oral Nightly PRN Jeanene ErbLaurie Kay Ballew, DO       ??? cloNIDine (CATAPRES) tablet 0.1 mg  0.1 mg Oral Q4H PRN Marcelina MorelLaurie Kay Ballew, DO       ??? glucose (GLUTOSE) 40 % oral gel 15 g  15 g Oral PRN Jeanene ErbLaurie Kay Ballew, DO       ??? glucagon (rDNA) injection 1 mg  1 mg Intramuscular PRN Jeanene ErbLaurie Kay Ballew, DO         Allergies   Allergen Reactions   ??? Tobacco [Nicotiana Tabacum] Shortness Of Breath   ???  Decongestant [Pseudoephedrine Hcl]    ??? Motrin [Ibuprofen]    ??? Cabbage Nausea And Vomiting     Principal Problem:    Major depression, recurrent, chronic (HCC)  Active Problems:    Obesity (BMI 30-39.9)    MVP (mitral valve prolapse)    Type 2 diabetes mellitus (HCC)    Right lumbar radiculopathy    Chronic constipation    Hypercholesteremia    Vitamin D deficiency    Suicidal ideations    Blood pressure 113/68, pulse 87, temperature 97.3 ??F (36.3 ??C), temperature source Temporal, resp. rate 18, height 5\' 5"  (1.651 m), weight 205 lb (92.987 kg), SpO2 95 %.    Subjective:  Symptoms:  Improved.  No shortness of breath, cough, chest pain, chest pressure or diarrhea.    Diet:  Adequate intake.  No nausea or vomiting.    Activity level: Normal.    Pain:  She reports no pain.      Objective:  General Appearance:  Comfortable.    Vital signs: (most recent): Blood pressure 113/68, pulse 87, temperature 97.3 ??F (36.3 ??C), temperature source Temporal, resp. rate 18, height 5\' 5"  (1.651 m), weight 205 lb (92.987 kg), SpO2 95 %.  Vital signs are normal.  No fever.    Output: Producing urine.    HEENT: Normal HEENT exam.    Lungs:  Normal respiratory rate and normal effort.  Breath sounds  clear to auscultation.  No wheezes or rhonchi.    Heart: Normal rate.  Regular rhythm.  S1 normal and S2 normal.    Extremities: Normal range of motion.    Neurological: Patient is alert and oriented to person, place and time.    Skin:  Warm, dry and pale.    Abdomen: There is no abdominal tenderness tenderness.     Pupils:  Pupils are equal, round, and reactive to light.    Pulses: Distal pulses are intact.      Assessment:    Condition: Improving.       Plan:   Discharge home.  Regular diet.  (She is oveall better  BS are less than 180  No side effects of meds  Home  Will follow with dr Lyn Recordsbrazzell at [facility]       Ulanda EdisonJesse Constance Hackenberg, MD  02/07/2014

## 2014-02-07 NOTE — Progress Notes (Signed)
[facility]  Discharge Note    Pt discharged with followings belongings:   Dentures: None  Vision - Corrective Lenses: Glasses  Hearing Aid: None  Jewelry: None  Body Piercings Removed: N/A  Clothing: Footwear, Pants, Shirt, Bathrobe, Socks, Undergarments (Comment)  Were All Patient Medications Collected?: Not Applicable  Other Valuables: Wallet   Valuables sent home with PT. Valuables retrieved from safe, Security envelope number:  N/A and returned to patient.  Patient left department with Departure Mode: With spouse via Mobility at Departure: Ambulatory, discharged to Discharged to: Private Residence. Patient education on aftercare instructions: GIVEN  Information faxed to N/A by N/A Patient verbalize understanding of AVS:  YES.    Status EXAM upon discharge:  Status and Exam  Normal: Yes  Facial Expression: Brightened  Affect: Appropriate  Level of Consciousness: Alert  Mood:Normal: Yes  Mood: Other (Comment) (Pleasant, calm, approiate )  Motor Activity:Normal: Yes  Interview Behavior: Cooperative  Preception: Orient to Person, Orient to Time, Orient to Scientist, research (physical sciences), Orient to Situation  Attention:Normal: Yes  Thought Processes: Circumstantial  Thought Content:Normal: Yes  Thought Content: Preoccupations  Hallucinations: None  Delusions: Yes  Memory:Normal: Yes  Memory: Other(See comment) (MNo problems noted)  Insight and Judgment: Yes  Insight and Judgment:  (fair)  Present Suicidal Ideation: No  Present Homicidal Ideation: No    Waunita Schooner, RN

## 2014-02-07 NOTE — Progress Notes (Signed)
SW met with treatment team to discuss pt's progress and setbacks.  SW 2 was present.  Pt is demonstrating improved mood, eating/sleeping adequately, active group participation, social with peers/staff, independent with ADLs, denies SI, will be discharged home today, follow-up appointments will be scheduled.

## 2014-02-07 NOTE — Discharge Summary (Signed)
Discharge Summary     Patient ID:  Joyce Cohen  Cohen  [id number]  58 y.o.  06-06-1955    Admit date: 02/02/2014  Discharge date: 02/07/2014    Admitting Physician: Jeanene ErbLaurie Kay Shamya Macfadden, DO   Attending Physician: Jeanene ErbLaurie Kay Ree Alcalde, DO  Discharge Physician: Jeanene ErbLaurie Kay Deshane Cotroneo     Admission Diagnoses: Suicidal ideations [R45.851]  Suicidal behavior [R45.851]    Discharge Diagnoses: Major Depression, Recurrent. Borderline Personality Disorder    Admission Condition: poor    Discharged Condition: stable    Indication for Admission: suicidal    Hospital Course: Initially the patient was upset and unable to participate in groups the first morning after admission. Pt began to be treated for diabetes which she was unaware of having. Soon the patient began to participate in hospital activitities and showed good improvedment in mood and in coping skills. She was followed by Dr. Ulanda EdisonJesse Wallace for treatment of the diabetes. On day of discharge the patient is alert and oriented in all areas. Speech is clear and conversational with no LOA or FOI. Mood is described a s better and affect is bright. No SI or HI, no psychosis of any kind. Recent and Remote memory are intact. Gait and station are WNL. Judgement and Insight are much improved and WNL.       Consults: Internal medicine: Dr. Earlene PlaterWallace    Significant Diagnostic Studies: labs: routine labs which except for HgB and accu checks, were WNL. Blood sugars consistently ran at 191 to 215.    Treatments: insulin: Lantus    Discharge Exam:  Performed by Dr. Earlene PlaterWallace    Disposition: home    Patient Instructions:   Current Discharge Medication List      START taking these medications    Details   LORazepam (ATIVAN) 0.5 MG tablet Take 1 tablet by mouth every 6 hours as needed for Anxiety (for agitation)  Qty: 90 tablet, Refills: 1      glipiZIDE (GLUCOTROL) 10 MG tablet Take 1 tablet by mouth every morning (before breakfast)  Qty: 60 tablet, Refills: 3      insulin glargine (LANTUS) 100 UNIT/ML  injection vial Inject 12 Units into the skin nightly  Qty: 1 vial, Refills: 3      metFORMIN (GLUCOPHAGE) 1000 MG tablet Take 1 tablet by mouth 2 times daily (with meals)  Qty: 60 tablet, Refills: 3      Omega-3 Fatty Acids (FISH OIL) 1000 MG CAPS Take 1 capsule by mouth 2 times daily  Qty: 90 capsule, Refills: 3         CONTINUE these medications which have CHANGED    Details   lidocaine-prilocaine (EMLA) 2.5-2.5 % cream Use as directed. Pt may take hospital supply.  Qty: 15 g, Refills: 1      polyethylene glycol (GLYCOLAX) powder Take 17 g by mouth daily  Qty: 120 g, Refills: 1         CONTINUE these medications which have NOT CHANGED    Details   traMADol (ULTRAM) 50 MG tablet Take 50 mg by mouth every 8 hours as needed for Pain       famotidine (PEPCID) 20 MG tablet Take 20 mg by mouth 2 times daily      Multiple Vitamins-Minerals (THERAPEUTIC MULTIVITAMIN-MINERALS) tablet Take 1 tablet by mouth daily      linaclotide (LINZESS) 290 MCG CAPS capsule Take 1 capsule by mouth every morning (before breakfast)  Qty: 30 capsule, Refills: 11    Associated Diagnoses: Chronic constipation  QUEtiapine (SEROQUEL) 50 MG tablet Take 400 mg by mouth nightly       traZODone (DESYREL) 150 MG tablet Take 100 mg by mouth nightly       buPROPion SR (WELLBUTRIN SR) 150 MG SR tablet Take 150 mg by mouth daily       Cholecalciferol (VITAMIN D3) 50000 UNITS CAPS Take 50,000 Units by mouth once a week Take on Sunday for 11 weeks. Started on 12/15/13      OXcarbazepine (TRILEPTAL) 150 MG tablet Take 150 mg by mouth 2 times daily         STOP taking these medications       ranitidine (ZANTAC) 150 MG capsule Comments:   Reason for Stopping:             Activity: activity as tolerated  Diet: diabetic diet  Wound Care: none needed    Follow-up with 2 weeks with [facility]      Time worked: More than 31 minutes    Participation:good    Electronically signed by Jeanene ErbLaurie Kay Athena Baltz, DO on 02/07/2014 at 9:27 AM

## 2014-02-07 NOTE — Progress Notes (Signed)
Group Therapy Note    Date: 02/07/2014  Start Time: 1100  End Time:  1200  Number of Participants: 2    Type of Group: Psychoeducation    Wellness Binder Information  Module Name:  Stress is a normal part of life.  Session Number:  1    Patient's Goal:      Notes:  Pt was an active participant in group discussion pertaining to stress.  Discussed the effects of stress and identified effective stress management techniques.    Status After Intervention:  Improved    Participation Level: Active Listener and Interactive    Participation Quality: Appropriate, Attentive and Sharing      Speech:  normal      Thought Process/Content: Logical      Affective Functioning: Blunted      Mood: anxious      Level of consciousness:  Alert and Oriented x4      Response to Learning: Able to verbalize current knowledge/experience, Able to verbalize/acknowledge new learning, Able to retain information and Able to change behavior      Endings:     Modes of Intervention: Education      Discipline Responsible: Psychoeducational Specialist      Signature:  Clydell Hakim

## 2014-02-07 NOTE — Progress Notes (Signed)
Patient lying quietly in bed with eyes closed.   Vital Signs obtained  Patient denies depression, anxiety, suicidal ideations, homicidal ideations, auditory and visual hallucinations. Pt eats, sleeps, completes ADL's, compliant with medications, social, group, no visitors. Pt has voiced no complaints this shift.

## 2014-02-07 NOTE — Plan of Care (Signed)
Problem: Altered Mood, Depressive Behavior  Goal: LTG-Able to verbalize and/or display a decrease in depressive symptoms  Encouraged to verbalized altered mood.  Goal: STG-Able to verbalize suicidal ideations  Encouraged to verbalized suicidal thoughts  Goal: LTG-Absence of self-harm  Encouraged patient to verbalized self harm ideals   Goal: STG-Knowledge of positive coping patterns  Reinforce positive coping behaviors    Problem: Health Behavior:  Intervention: Encourage verbalization of feelings  Encourage pt to verbalize feelings  Intervention: Use therapeutic communication skills to develop a trusting relationship  Encourage therapeutic communication skills  Intervention: Patient observation  Continue patient observation    Goal: Ability to verbalize adaptive coping strategies to use when suicidal feelings occur will improve  Ability to verbalize adaptive coping strategies to use when suicidal feelings occur will improve   Encourage to verbalize adaptive strategies for feelings of suicidal thoughts

## 2014-02-19 ENCOUNTER — Emergency Department: Admit: 2014-02-19 | Payer: BLUE CROSS/BLUE SHIELD | Primary: Emergency Medicine

## 2014-02-19 ENCOUNTER — Emergency Department: Admit: 2014-02-20 | Payer: BLUE CROSS/BLUE SHIELD | Primary: Emergency Medicine

## 2014-02-19 ENCOUNTER — Inpatient Hospital Stay
Admit: 2014-02-19 | Discharge: 2014-02-20 | Disposition: A | Discharge status: Home / Self Care | Payer: BLUE CROSS/BLUE SHIELD | Attending: Emergency Medicine

## 2014-02-19 DIAGNOSIS — H8309 Labyrinthitis, unspecified ear: Secondary | ICD-10-CM

## 2014-02-19 LAB — CBC WITH AUTO DIFFERENTIAL
Basophils %: 0.6 % (ref 0.0–1.0)
Basophils Absolute: 0 10*3/uL (ref 0.00–0.20)
Eosinophils %: 3.2 % (ref 0.0–5.0)
Eosinophils Absolute: 0.2 10*3/uL (ref 0.00–0.60)
Hematocrit: 42.8 % (ref 37.0–47.0)
Hemoglobin: 14.7 g/dL (ref 12.0–16.0)
Lymphocytes %: 41.4 % — ABNORMAL HIGH (ref 20.0–40.0)
Lymphocytes Absolute: 3 10*3/uL (ref 1.1–4.5)
MCH: 29.1 pg (ref 27.0–31.0)
MCHC: 34.3 g/dL (ref 33.0–37.0)
MCV: 84.6 fL (ref 81.0–99.0)
MPV: 10.4 fL (ref 7.4–10.4)
Monocytes %: 7.6 % (ref 0.0–10.0)
Monocytes Absolute: 0.5 10*3/uL (ref 0.00–0.90)
Neutrophils %: 47.2 % — ABNORMAL LOW (ref 50.0–65.0)
Neutrophils Absolute: 3.4 10*3/uL (ref 1.5–7.5)
Platelets: 204 10*3/uL (ref 130–400)
RBC: 5.06 M/uL (ref 4.20–5.40)
RDW: 13 % (ref 11.5–14.5)
WBC: 7.1 10*3/uL (ref 4.8–10.8)

## 2014-02-19 LAB — COMPREHENSIVE METABOLIC PANEL
ALT: 76 U/L — ABNORMAL HIGH (ref 5–33)
AST: 32 U/L (ref 5–32)
Albumin: 4.5 g/dL (ref 3.5–5.2)
Alkaline Phosphatase: 91 U/L (ref 35–104)
Anion Gap: 16 mmol/L (ref 7–19)
BUN: 13 mg/dL (ref 6–20)
CO2: 22 mmol/L (ref 22–29)
Calcium: 9.9 mg/dL (ref 8.6–10.0)
Chloride: 103 mmol/L (ref 98–111)
Creatinine: 1 mg/dL — ABNORMAL HIGH (ref 0.6–0.9)
GFR Non-African American: 57 — AB (ref >60)
Globulin: 2.1 g/dL
Glucose: 104 mg/dL (ref 74–109)
Potassium: 4.3 mmol/L (ref 3.5–5.1)
Sodium: 141 mmol/L (ref 136–145)
Total Bilirubin: 0.4 mg/dL (ref 0.2–1.2)
Total Protein: 6.6 g/dL — ABNORMAL LOW (ref 6.7–8.7)

## 2014-02-19 LAB — POCT GLUCOSE: POC Glucose: 102 mg/dl — ABNORMAL HIGH (ref 70–99)

## 2014-02-19 LAB — POCT VENOUS: POC Troponin I: 0 ng/mL (ref 0.00–0.08)

## 2014-02-19 MED ORDER — LORAZEPAM 2 MG/ML IJ SOLN
2 MG/ML | Freq: Once | INTRAMUSCULAR | Status: AC
Start: 2014-02-19 — End: 2014-02-19
  Administered 2014-02-19: 23:00:00 1 mg via INTRAVENOUS

## 2014-02-19 MED ORDER — DIPHENHYDRAMINE HCL 50 MG/ML IJ SOLN
50 MG/ML | Freq: Once | INTRAMUSCULAR | Status: AC
Start: 2014-02-19 — End: 2014-02-19
  Administered 2014-02-19: 23:00:00 25 mg via INTRAVENOUS

## 2014-02-19 MED ORDER — ONDANSETRON HCL 4 MG/2ML IJ SOLN
4 MG/2ML | Freq: Once | INTRAMUSCULAR | Status: AC
Start: 2014-02-19 — End: 2014-02-19
  Administered 2014-02-19: 23:00:00 4 mg via INTRAVENOUS

## 2014-02-19 MED ORDER — SODIUM CHLORIDE 0.9 % IV BOLUS
0.9 % | Freq: Once | INTRAVENOUS | Status: AC
Start: 2014-02-19 — End: 2014-02-19
  Administered 2014-02-19: 23:00:00 1000 mL via INTRAVENOUS

## 2014-02-19 MED FILL — LORAZEPAM 2 MG/ML IJ SOLN: 2 MG/ML | INTRAMUSCULAR | Qty: 1

## 2014-02-19 MED FILL — ONDANSETRON HCL 4 MG/2ML IJ SOLN: 4 MG/2ML | INTRAMUSCULAR | Qty: 2

## 2014-02-19 MED FILL — DIPHENHYDRAMINE HCL 50 MG/ML IJ SOLN: 50 MG/ML | INTRAMUSCULAR | Qty: 1

## 2014-02-19 NOTE — ED Notes (Signed)
Pt is in MRI    Charlotta Newton, RN  02/19/14 2020

## 2014-02-19 NOTE — ED Provider Notes (Signed)
[facility] EMERGENCY DEPT  eMERGENCY dEPARTMENT eNCOUnter      Pt Name: Joyce Cohen  MRN: [id number]  Birthdate 1955/11/24  Date of evaluation: 02/19/2014  Provider: Sharon Seller, MD    CHIEF COMPLAINT       Chief Complaint   Patient presents with   ??? Dizziness     blood sugar problems         HISTORY OF PRESENT ILLNESS   (Location/Symptom, Timing/Onset, Context/Setting, Quality, Duration, Modifying Factors, Severity)  Note limiting factors.   Joyce Cohen is a 59 y.o. female who presents to the emergency department she describes her dizziness as everything feeling surreal and like she is in a [employer]. The patient states that she is having dizziness which she feels like things are coming at her. She is extremely vague and a very poor historian she states that she feels like at times things are moving. The patient states she feels like she is tasting vinegar. The patient denies vomiting she has had nausea she denies any slurred speech diplopia focal motor weakness numbness tingling or other focal findings.    Patient is a 59 y.o. female presenting with dizziness.   Dizziness  Quality:  Unable to specify  Severity:  Moderate  Onset quality:  Sudden  Duration:  2 days  Timing:  Intermittent  Progression:  Waxing and waning  Chronicity:  New  Context: not when bending over, not with bowel movement, not with ear pain, not with eye movement, not with head movement, not with inactivity, not with loss of consciousness, not with medication, not with physical activity, not when standing up and not when urinating    Relieved by:  Nothing  Worsened by:  Nothing tried  Associated symptoms: no blood in stool, no chest pain, no diarrhea, no headaches, no hearing loss, no nausea, no palpitations, no shortness of breath, no syncope, no tinnitus, no vision changes, no vomiting and no weakness        Nursing Notes were reviewed.    REVIEW OF SYSTEMS    (2-9 systems for level 4, 10 or more for level 5)      Review of Systems   Constitutional: Negative for fever, chills, diaphoresis and fatigue.   HENT: Negative for congestion, facial swelling, hearing loss, rhinorrhea, sinus pressure, sneezing, sore throat, tinnitus and trouble swallowing.    Eyes: Negative for photophobia and pain.   Respiratory: Negative for cough, chest tightness, shortness of breath, wheezing and stridor.    Cardiovascular: Negative for chest pain, palpitations, leg swelling and syncope.   Gastrointestinal: Negative for nausea, vomiting, abdominal pain, diarrhea, constipation, blood in stool and abdominal distention.   Endocrine: Negative for polydipsia.   Genitourinary: Negative for dysuria, hematuria, flank pain and difficulty urinating.   Musculoskeletal: Negative for myalgias, back pain, neck pain and neck stiffness.   Neurological: Positive for dizziness. Negative for weakness, light-headedness and headaches.   Hematological: Negative for adenopathy.   Psychiatric/Behavioral: Negative for confusion.       A complete review of systems was performed and is negative except as noted above in the HPI.       PAST MEDICAL HISTORY     Past Medical History   Diagnosis Date   ??? Pleurisy without mention of effusion or current tuberculosis    ??? Unspecified asthma(493.90)    ??? Unspecified hemorrhoids without mention of complication    ??? Abdominal pain, right lower quadrant    ??? Unspecified constipation    ???  Arthritis    ??? Bipolar disorder (HCC)    ??? MVP (mitral valve prolapse)    ??? Cancer (HCC)      skin cancer   ??? 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 Stomach ulcer      as a child   ??? Blood circulation, collateral    ??? Neuromuscular disorder (HCC) Inguinal Nerve pain         SURGICAL HISTORY       Past Surgical History   Procedure Laterality Date   ??? Rotator cuff repair Right    ??? Tonsillectomy     ??? Hysterectomy     ??? Appendectomy     ??? Wisdom tooth extraction     ??? Abdominal exploration surgery       times two due to adhesions   ???  Colonoscopy  05/19/11     Dr Renato Gails   ??? Colonoscopy  07/15/2012     Dr. Zollie Beckers   ??? Skin biopsy  L and R wrists         CURRENT MEDICATIONS       Previous Medications    BUPROPION SR (WELLBUTRIN SR) 150 MG SR TABLET    Take 150 mg by mouth daily     CHOLECALCIFEROL (VITAMIN D3) 50000 UNITS CAPS    Take 50,000 Units by mouth once a week Take on Sunday for 11 weeks. Started on 12/15/13    FAMOTIDINE (PEPCID) 20 MG TABLET    Take 20 mg by mouth 2 times daily    GLIPIZIDE (GLUCOTROL) 10 MG TABLET    Take 1 tablet by mouth every morning (before breakfast)    INSULIN GLARGINE (LANTUS) 100 UNIT/ML INJECTION VIAL    Inject 12 Units into the skin nightly    LIDOCAINE-PRILOCAINE (EMLA) 2.5-2.5 % CREAM    Use as directed. Pt may take hospital supply.    LINACLOTIDE (LINZESS) 290 MCG CAPS CAPSULE    Take 1 capsule by mouth every morning (before breakfast)    LORAZEPAM (ATIVAN) 0.5 MG TABLET    Take 1 tablet by mouth every 6 hours as needed for Anxiety (for agitation)    METFORMIN (GLUCOPHAGE) 1000 MG TABLET    Take 1 tablet by mouth 2 times daily (with meals)    MULTIPLE VITAMINS-MINERALS (THERAPEUTIC MULTIVITAMIN-MINERALS) TABLET    Take 1 tablet by mouth daily    OMEGA-3 FATTY ACIDS (FISH OIL) 1000 MG CAPS    Take 1 capsule by mouth 2 times daily    OXCARBAZEPINE (TRILEPTAL) 150 MG TABLET    Take 150 mg by mouth 2 times daily    POLYETHYLENE GLYCOL (GLYCOLAX) POWDER    Take 17 g by mouth daily    QUETIAPINE (SEROQUEL) 50 MG TABLET    Take 400 mg by mouth nightly     TRAMADOL (ULTRAM) 50 MG TABLET    Take 50 mg by mouth every 8 hours as needed for Pain     TRAZODONE (DESYREL) 150 MG TABLET    Take 100 mg by mouth nightly        ALLERGIES     Tobacco; Decongestant; Motrin; and Cabbage    FAMILY HISTORY       Family History   Problem Relation Age of Onset   ??? Alcohol Abuse Father    ??? Alcohol Abuse Mother    ??? Mental Illness Mother    ??? Hypertension Mother    ??? Mental Illness Maternal Grandmother    ???  Hypertension Maternal  Grandmother    ??? Alcohol Abuse Brother    ??? Mental Illness Daughter    ??? Cancer Paternal Grandfather    ??? Colon Cancer Neg Hx    ??? Colon Polyps Neg Hx    ??? Liver Cancer Neg Hx    ??? Stomach Cancer Neg Hx    ??? Ulcerative Colitis Neg Hx    ??? Crohn's Disease Neg Hx    ??? Liver Disease Neg Hx    ??? Rectal Cancer Neg Hx           SOCIAL HISTORY       History     Social History   ??? Marital Status: Married     Spouse Name: N/A     Number of Children: 2   ??? Years of Education: N/A     Social History Main Topics   ??? Smoking status: Never Smoker    ??? Smokeless tobacco: Never Used   ??? Alcohol Use: No   ??? Drug Use: No   ??? Sexual Activity: None     Other Topics Concern   ??? None     Social History Narrative       SCREENINGS             PHYSICAL EXAM    (up to 7 for level 4, 8 or more for level 5)   ED Triage Vitals   BP Temp Temp src Pulse Resp SpO2 Height Weight   02/19/14 1508 02/19/14 1508 -- 02/19/14 1508 02/19/14 1508 02/19/14 1508 02/19/14 1508 02/19/14 1508   140/91 mmHg 97 ??F (36.1 ??C)  88 18 96 %  (1.651 m) 202 lb (91.627 kg)       Physical Exam   Constitutional: She appears well-developed and well-nourished.   HENT:   Head: Normocephalic and atraumatic.   Mouth/Throat: Oropharynx is clear and moist.   Eyes: Conjunctivae are normal. Pupils are equal, round, and reactive to light. No scleral icterus.   Neck: Normal range of motion. Neck supple. No tracheal deviation present. No thyromegaly present.   Cardiovascular: Normal rate, regular rhythm, normal heart sounds and intact distal pulses.  Exam reveals no gallop and no friction rub.    No murmur heard.  Pulmonary/Chest: Effort normal and breath sounds normal. No respiratory distress. She has no wheezes.   Abdominal: Soft. Bowel sounds are normal. She exhibits no distension. There is no tenderness. There is no rebound and no guarding.   Musculoskeletal: She exhibits no edema or tenderness.   Neurological: She is alert. She has normal reflexes. She is not disoriented. No  cranial nerve deficit or sensory deficit. Coordination normal. GCS eye subscore is 4. GCS verbal subscore is 5. GCS motor subscore is 6.   Was a difficult exam as the patient would not open her eyes I was trying to assess her for the possibility of nystagmus. I do not see any distinct abnormality on head impulse test but this was extremely challenging. The patient had no other focal findings seen on exam   Skin: No rash noted. No erythema. No pallor.   Nursing note and vitals reviewed.      DIAGNOSTIC RESULTS     EKG: All EKG's are interpreted by the Emergency Department Physician who either signs or Co-signs this chart in the absence of a cardiologist.        RADIOLOGY:   Non-plain film images such as CT, Ultrasound and MRI are read by the radiologist. Plain radiographic  images are visualized and preliminarily interpreted by the emergency physician with the below findings:        Interpretation per the Radiologist below, if available at the time of this note:    CT HEAD WO CONTRAST    Final Result: IMPRESSION:      1. No acute intracranial process.        Dictated on 02/19/2014 7:11 PM EST. Signed by Dr Barrie Dunker on    02/19/2014 7:13 PM EST    Signed by Dr Reuel Boom Riherd  on 02/19/2014 18:13   MRI BRAIN WO CONTRAST    Final Result: Impression:      Minimal changes of chronic microvascular ischemia without evidence of    acute intracranial process.         Dictated on 02/19/2014 9:08 PM EST. Signed by Dr Barrie Dunker on    02/19/2014 9:10 PM EST    Signed by Dr Reuel Boom Riherd  on 02/19/2014 20:10         ED BEDSIDE ULTRASOUND:   Performed by ED Physician - none    LABS:  Labs Reviewed   CBC WITH AUTO DIFFERENTIAL - Abnormal; Notable for the following:     Neutrophils Relative 47.2 (*)     Lymphocytes Relative 41.4 (*)     All other components within normal limits   COMPREHENSIVE METABOLIC PANEL - Abnormal; Notable for the following:     CREATININE 1.0 (*)     GFR Non-African American 57 (*)     Total Protein 6.6 (*)      ALT 76 (*)     All other components within normal limits   POCT GLUCOSE - Abnormal; Notable for the following:     POC Glucose 102 (*)     All other components within normal limits   POCT TROPONIN   POCT VENOUS   POCT GLUCOSE       All other labs were within normal range or not returned as of this dictation.    RE-ASSESSMENT     02/19/14  8:25 PM  Sharon Seller, MD  Patient much improved at time of reevaluation terms of her dizziness her MRI is negative we'll discharge her home      EMERGENCY DEPARTMENT COURSE and DIFFERENTIAL DIAGNOSIS/MDM:   Vitals:    Filed Vitals:    02/19/14 1508 02/19/14 1658 02/19/14 2022   BP: 140/91 159/82 128/79   Pulse: 88 89 95   Temp: 97 ??F (36.1 ??C)     Resp: 18 22 18    Height: 5\' 5"  (1.651 m)     Weight: 202 lb (91.627 kg)     SpO2: 96% 96% 96%       MDM  Number of Diagnoses or Management Options  Labyrinthine vestibulitis, unspecified laterality:      Amount and/or Complexity of Data Reviewed  Clinical lab tests: reviewed and ordered  Tests in the radiology section of CPT??: reviewed and ordered  Tests in the medicine section of CPT??: ordered and reviewed  Decide to obtain previous medical records or to obtain history from someone other than the patient: yes  Review and summarize past medical records: yes  Independent visualization of images, tracings, or specimens: yes    Risk of Complications, Morbidity, and/or Mortality  Presenting problems: high  Diagnostic procedures: high  Management options: high  General comments: She complains of dizziness with some rather bizarre neurologic complaints is difficult to elicit exactly what is causing her symptoms of this is  true vertigo versus other she is a difficult exam. She is a diabetic and would have risk factors for vascular disease. The patient's head CT is negative I put in to get an MRI without contrast to make sure to fully exclude stroke          CONSULTS:  None    PROCEDURES:  Unless otherwise noted below, none      Procedures    FINAL IMPRESSION    No diagnosis found.      DISPOSITION/PLAN   DISPOSITION     PATIENT REFERRED TO:  No follow-up provider specified.    DISCHARGE MEDICATIONS:  New Prescriptions    No medications on file          (Please note that portions of this note were completed with a voice recognition program.  Efforts were made to edit the dictations but occasionally words are mis-transcribed.)    Sharon Seller, MD (electronically signed)  Attending Emergency Physician          Sharon Seller, MD  02/19/14 2027

## 2014-02-19 NOTE — ED Notes (Signed)
Patient and family updated on reason for wait and plan of care.   MRI called in    Charlotta Newton, RN  02/19/14 [phone]

## 2014-02-19 NOTE — Discharge Instructions (Signed)
 Labyrinthitis: Care Instructions  Your Care Instructions     Labyrinthitis (say "lab-uh-rin-THY-tus") is a problem deep inside your inner ear. It happens when the labyrinth gets inflamed. That's the part of your inner ear that helps control your balance.  The problem may cause vertigo. Vertigo makes you feel like you're spinning or whirling. You may feel sick to your stomach or vomit. You may lose your hearing for a while. Or you may have a ringing sound in your ears.  Most of the time, labyrinthitis goes away on its own. This often takes several weeks.  If the problem is caused by bacteria, your doctor will give you antibiotics. But most cases are caused by a virus. A virus can't be cured with antibiotics.  Your doctor may give you medicines to help control the nausea and vomiting.  Follow-up care is a key part of your treatment and safety. Be sure to make and go to all appointments, and call your doctor if you are having problems. It's also a good idea to know your test results and keep a list of the medicines you take.  How can you care for yourself at home?   Try bed rest and keeping your head still for the first few days you have vertigo. This may help the vertigo and reduce nausea and vomiting.   Return to your normal activities if vertigo lasts more than a few days. This may be hard, but it usually helps your brain adapt to the vertigo more quickly. As your brain adapts, vertigo will slowly go away.   Do what you can to prevent falls. For example, keep your home uncluttered, and use nonskid mats around your house and in your bath. Vertigo makes you more likely to fall.   Try balance exercises for vertigo if your doctor suggests it. An example is to stand with your feet together, arms at your sides. Hold this position for 30 seconds.   Do the Brandt-Daroff exercise if your doctor suggests it. It may help your brain adapt to vertigo.   Sit on the edge of your bed or sofa.   Quickly lie down on one  side.   Stay in this position until the vertigo goes away or for at least 30 seconds.   Sit up. If this causes vertigo, wait for it to stop.   Do the exercise on the other side.   Repeat these steps 10 times. Do the exercise 2 times a day until the vertigo is gone.   Take your medicines exactly as prescribed. Call your doctor if you think you are having a problem with your medicine.   If your doctor prescribed antibiotics, take them as directed. Do not stop taking them just because you feel better. You need to take the full course of antibiotics.  When should you call for help?  Call 911 anytime you think you may need emergency care. For example, call if:   You passed out (lost consciousness).   You have signs of a stroke. These may include:   Sudden numbness, paralysis, or weakness in your face, arm, or leg, especially on only one side of your body.   New problems with walking or balance.   Drooling or slurred speech.   New problems speaking or understanding simple statements, or feeling confused.   A sudden, severe headache that is different from past headaches.  Call your doctor now or seek immediate medical care if:   You have new or increased nausea or  vomiting.  Watch closely for changes in your health, and be sure to contact your doctor if:   Your vertigo gets worse.   Your vertigo has not gotten better in 2 weeks.   Where can you learn more?   Go to [email or web address] and sign in to your MyChart account. Enter [phone] in the Search Health Information box to learn more about "Labyrinthitis: Care Instructions."    If you do not have an account, please click on the "Sign Up Now" link.      2006-2015 [employer]. Care instructions adapted under license by [facility]. This care instruction is for use with your licensed healthcare professional. If you have questions about a medical condition or this instruction, always ask your healthcare professional. [employer] disclaims any warranty or liability for your use of this information.  Content Version: 10.6.465758; Current as of: January 01, 2013

## 2014-02-19 NOTE — ED Notes (Signed)
Pt has returned from MRI and is now on the cardiac monitor and pulse oximeter.     Charlotta Newton, RN  02/19/14 2022

## 2014-02-19 NOTE — ED Notes (Signed)
States feeling bad since yest dizziness, unable to check sugar at home ran out of supplies, bs 102 in triage.     Paula Compton, RN  02/19/14 [phone]

## 2014-02-20 LAB — EKG 12-LEAD
P Axis: 43 degrees
P-R Interval: 158 ms
Q-T Interval: 382 ms
QRS Duration: 108 ms
QTc Calculation (Bazett): 431 ms
T Axis: 21 degrees

## 2014-02-20 MED ORDER — MECLIZINE HCL 25 MG PO TABS
25 MG | ORAL_TABLET | Freq: Three times a day (TID) | ORAL | Status: DC | PRN
Start: 2014-02-20 — End: 2014-02-28

## 2014-02-28 ENCOUNTER — Inpatient Hospital Stay
Admit: 2014-02-28 | Discharge: 2014-02-28 | Payer: BLUE CROSS/BLUE SHIELD | Attending: Pain Medicine | Primary: Emergency Medicine

## 2014-02-28 DIAGNOSIS — R1030 Lower abdominal pain, unspecified: Secondary | ICD-10-CM

## 2014-02-28 NOTE — Discharge Instructions (Signed)
Pt. Is to have an accurate medication list and a responsible driver for next appt.

## 2014-02-28 NOTE — Progress Notes (Addendum)
[facility] [place]  Patient Pain Assessment  Consultation -  RUEAVWU@    Primary Care Physician: Sharmon Leyden, MD    Chief complaint:   Chief Complaint   Patient presents with   ??? Groin Pain     Pt. reports right groin pain   .    HISTORY OF PRESENT ILLNESS:  VALARY MANAHAN is 59 y.o. female with    HPI  The patient returns today. She complains of pain in her right groin. Pain level is a 4.     She states she had 100% pain relief for about 4 months after her last injection. She believes the cream is helping with her pain relief. She has improved quite a bit with these injections. She is looking forward to her injections today. She has recently been diagnosed with diabetes. She has lost 15lbs. Patient is a non-smoker. Her mood seems good today. She states her husband is not able to drive today so we will reschedule the procedure in 1 month.        Zadie Rhine compliant? yes  Concern for prescription abuse?    Current Pain Assessment  Pain Assessment  Pain Assessment: 0-10  Pain Level: 4  Pain Type: Chronic pain  Pain Location: Groin  Pain Orientation: Right  Pain Descriptors: Aching, Sharp, Constant  Pain Frequency: Continuous  Pain Onset: On-going  Clinical Progression: Gradually improving  Effect of Pain on Daily Activities: Pt reports that this pain is moderately affecting her activites of daily living.  Patient's Stated Pain Goal: No pain  Pain Intervention(s): Medication (see eMar), Heat applied, Cold applied, Repositioned  Response to Pain Intervention: None                    ADVERSE MEDICATION EFFECTS:   Constipation: no  Bowel Regimen: Yes  Diet: diabetic,  calorie  Appetite:  ok  Sedation:  no  Urinary Retention: no    FOCUSED PAIN SCALE:  Highest : 9  Lowest :4  Average: Range-7  When and What  was your last procedure:      Was your procedure effective:  not applicable    ACTIVITY/SOCIAL/EMOTIONAL:  Sleep Pattern: 9 hours per night. generally restful sleep  Energy Level:   Normal  Currently attending Physical Therapy:  No  Home Exercises: never none  Mobility: no current mobility problem   Currently seeing a Psychiatrist or Psychologist:  Yes  Emotional Issues:Hx. Bipolar   Mood: appropriate     ABERRANT BEHAVIORS SINCE LAST VISIT:  Have you ever been treated in another Pain Clinic no  Refills for prescriptions appropriate: yes  Lost rx/pills: no  Taking more medication than prescribed:  no  Are you receiving PAIN medications from  other doctors: no  Last Urine/Serum Drug Screen :  Was Serum/UDS as anticipated?  yes  Are currently pregnant? no  Recent ER visits: Yes             Past Medical History      Diagnosis Date   ??? Pleurisy without mention of effusion or current tuberculosis    ??? Unspecified asthma(493.90)    ??? Unspecified hemorrhoids without mention of complication    ??? Abdominal pain, right lower quadrant    ??? Unspecified constipation    ??? Arthritis    ??? Bipolar disorder (HCC)    ??? MVP (mitral valve prolapse)    ??? Cancer (HCC)      skin cancer   ??? 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 Stomach ulcer      as a child   ??? Blood circulation, collateral    ??? Neuromuscular disorder (HCC) Inguinal Nerve pain   ??? Diabetes mellitus (HCC)    ??? Hyperlipidemia    ??? Right groin pain 02/28/2014       Surgical History  Past Surgical History   Procedure Laterality Date   ??? Rotator cuff repair Right    ??? Tonsillectomy     ??? Hysterectomy     ??? Appendectomy     ??? Wisdom tooth extraction     ??? Abdominal exploration surgery       times two due to adhesions   ??? Colonoscopy  05/19/11     Dr Renato Gailseed   ??? Colonoscopy  07/15/2012     Dr. Zollie BeckersHendon   ??? Skin biopsy  L and R wrists   ??? Abdomen surgery     ??? Colon surgery  04/2013     1 polyp removed       Medications  Current Outpatient Prescriptions   Medication Sig Dispense Refill   ??? glipiZIDE (GLUCOTROL) 10 MG tablet Take 1 tablet by mouth every morning (before breakfast) 60 tablet 3   ??? insulin glargine (LANTUS) 100 UNIT/ML injection vial  Inject 12 Units into the skin nightly 1 vial 3   ??? metFORMIN (GLUCOPHAGE) 1000 MG tablet Take 1 tablet by mouth 2 times daily (with meals) 60 tablet 3   ??? lidocaine-prilocaine (EMLA) 2.5-2.5 % cream Use as directed. Pt may take hospital supply. 15 g 1   ??? Cholecalciferol (VITAMIN D3) 50000 UNITS CAPS Take 50,000 Units by mouth once a week Take on Sunday for 11 weeks. Started on 12/15/13     ??? OXcarbazepine (TRILEPTAL) 150 MG tablet Take 150 mg by mouth 2 times daily     ??? famotidine (PEPCID) 20 MG tablet Take 20 mg by mouth 2 times daily     ??? Multiple Vitamins-Minerals (THERAPEUTIC MULTIVITAMIN-MINERALS) tablet Take 1 tablet by mouth daily     ??? linaclotide (LINZESS) 290 MCG CAPS capsule Take 1 capsule by mouth every morning (before breakfast) 30 capsule 11   ??? QUEtiapine (SEROQUEL) 50 MG tablet Take 400 mg by mouth nightly      ??? buPROPion SR (WELLBUTRIN SR) 150 MG SR tablet Take 150 mg by mouth daily      ??? traZODone (DESYREL) 150 MG tablet Take 100 mg by mouth nightly        No current facility-administered medications for this encounter.       Allergies  Tobacco; Decongestant; Motrin; and Cabbage    Family History  family history includes Alcohol Abuse in her brother, father, and mother; Cancer in her paternal grandfather; Hypertension in her maternal grandmother and mother; Mental Illness in her daughter, maternal grandmother, and mother. There is no history of Colon Cancer, Colon Polyps, Liver Cancer, Stomach Cancer, Ulcerative Colitis, Crohn's Disease, Liver Disease, or Rectal Cancer.    Social History  History     Social History   ??? Marital Status: Married     Spouse Name: Johnnie     Number of Children: 2   ??? Years of Education: 15     Social History Main Topics   ??? Smoking status: Never Smoker    ??? Smokeless tobacco: Never Used   ??? Alcohol Use: No   ??? Drug Use: No   ??? Sexual Activity: None     Other Topics Concern   ???  None     Social History Narrative      reports that she does not use illicit  drugs.        REVIEW OF SYSTEMS:  Review of Systems   Constitutional: Positive for weight loss. Negative for fever, chills, malaise/fatigue and diaphoresis.   HENT: Positive for congestion and tinnitus. Negative for ear discharge, ear pain, hearing loss, nosebleeds and sore throat.    Eyes: Negative.    Respiratory: Positive for cough and sputum production. Negative for hemoptysis, shortness of breath, wheezing and stridor.    Cardiovascular: Negative.    Gastrointestinal: Negative.    Genitourinary: Negative.    Musculoskeletal: Positive for joint pain. Negative for myalgias, back pain, falls and neck pain.   Skin: Negative.    Neurological: Positive for weakness. Negative for dizziness, tingling, tremors, sensory change, speech change, focal weakness, seizures, loss of consciousness and headaches.   Endo/Heme/Allergies: Positive for environmental allergies. Negative for polydipsia. Does not bruise/bleed easily.   Psychiatric/Behavioral: Positive for depression. Negative for suicidal ideas, hallucinations, memory loss and substance abuse. The patient has insomnia. The patient is not nervous/anxious.             GENERAL PHYSICAL EXAM:  Vitals: BP 122/66 mmHg   Pulse 89   Temp(Src) 98.9 ??F (37.2 ??C) (Temporal)   Resp 18   Ht  (1.651 m)   Wt 203 lb (92.08 kg)   BMI 33.78 kg/m2   SpO2 96%, Body mass index is 33.78 kg/(m^2).  Physical Exam Ortho Exam    DATA  Labs:  BENZODIAZEPINE SCREEN, URINE   Date Value Ref Range Status   02/02/2014 Negative Negative <100 ng/mL Final        Imaging:  Radiology Images and Reports reviewed where indicated and necessary    ASSESSMENT  Aidyn Sportsman Loring is a 59 y.o. female with       PLAN  See patient back in 1-2 months for planned nerve injections. Patient is to call with any questions or concerns which may arise prior to the next office visit.        Electronically signed by Leeann Must, RN on 02/28/2014 at 10:57 AM

## 2014-03-17 ENCOUNTER — Telehealth

## 2014-03-17 MED ORDER — LUBIPROSTONE 24 MCG PO CAPS
24 MCG | ORAL_CAPSULE | Freq: Two times a day (BID) | ORAL | Status: DC
Start: 2014-03-17 — End: 2016-02-21

## 2014-03-17 NOTE — Telephone Encounter (Signed)
Last seen in office 12-30-13 [facility] aprn. No follow up scheduled. [facility] called [phone] said the Linzess 290 mcg is no longer covered on her insurance but they will pay for Lactulose or Amitiza, they are asking if you wish to change medication or start a PA. Dh cma        Assessment:       1.  Chronic constipation   linaclotide (LINZESS) 290 MCG CAPS capsule          Plan:       1. Stop linzess daily  2. Escribed Linzess daily #30 with 11 refills  3. Samples x 7 boxes provided to pt  4. F/u in 1 year for med refills, or sooner if needed

## 2014-03-17 NOTE — Telephone Encounter (Signed)
Let her know this.  I escribed Amitiza BID #60 with 11 refills.  Let her know this takes the place of the Linzess.  Make her an appt to see me in about 8 weeks after starting the Amitiza to note med efficacy. If it is working great, she can cancel that appt and see me in 1 year for med refills.

## 2014-03-18 NOTE — Telephone Encounter (Signed)
I have tried several times to reach the patient and her phone will not ring goes straight to a recording that says her VM has not been set up so no way to leave a message and this is the only number listed, so I called [facility] and they said they have a very difficult time reaching her as well but if they get in touch with her they will tell her [facility] aprn wants her to have an 8 week follow up appointment. Dh cma

## 2014-03-21 MED ORDER — LACTULOSE 10 GM/15ML PO SOLN
10 GM/15ML | Freq: Three times a day (TID) | ORAL | Status: AC
Start: 2014-03-21 — End: 2014-04-20

## 2014-03-21 NOTE — Telephone Encounter (Signed)
Last seen in office 12-30-13 [facility] aprn. No follow up scheduled. [facility] called said the Amitiza went through on her insurance but her co-pay is $300.00 and she cannot afford, ( see phone message dated 03-18-14), the other option her insurance will pay for is Lactulose and they are asking if you agree to escribe them a prescription for that please

## 2014-03-21 NOTE — Telephone Encounter (Signed)
Please let her know the problem with her insurance.  I will escribe lactulose. She may use it TID prn.

## 2014-03-22 NOTE — Telephone Encounter (Signed)
Once again I have tried to reach the patient and she will never answer her cell and it then goes straight to VM and she has not set her VM up so no way to leave a message, The Pharmacist at [facility] said if at all possible he will try to get the message to her regarding this medication change, the last time you switched the Linzess ( due to non-payment of insurance) to [place] ( which the insurance said they will cover but failed to let her know the co-pay is $300.00) you wanted an 8 week follow up and the Pharmacist said he did tell the husband the last time he came in but we have not received a call yet from them to schedule. Dh cma

## 2014-04-04 ENCOUNTER — Ambulatory Visit: Payer: BLUE CROSS/BLUE SHIELD | Attending: Pain Medicine | Primary: Emergency Medicine

## 2014-05-12 ENCOUNTER — Inpatient Hospital Stay
Admission: EM | Admit: 2014-05-12 | Discharge: 2014-05-16 | Disposition: A | Discharge status: Home / Self Care | Payer: BLUE CROSS/BLUE SHIELD | Admitting: Psychiatry

## 2014-05-12 DIAGNOSIS — F319 Bipolar disorder, unspecified: Secondary | ICD-10-CM

## 2014-05-12 LAB — CBC WITH AUTO DIFFERENTIAL
Basophils %: 0.8 % (ref 0.0–1.0)
Basophils Absolute: 0.1 10*3/uL (ref 0.00–0.20)
Eosinophils %: 2.6 % (ref 0.0–5.0)
Eosinophils Absolute: 0.2 10*3/uL (ref 0.00–0.60)
Hematocrit: 42.7 % (ref 37.0–47.0)
Hemoglobin: 14.8 g/dL (ref 12.0–16.0)
Lymphocytes %: 36.4 % (ref 20.0–40.0)
Lymphocytes Absolute: 2.6 10*3/uL (ref 1.1–4.5)
MCH: 29.5 pg (ref 27.0–31.0)
MCHC: 34.7 g/dL (ref 33.0–37.0)
MCV: 85.2 fL (ref 81.0–99.0)
MPV: 11.2 fL — ABNORMAL HIGH (ref 7.4–10.4)
Monocytes %: 5.7 % (ref 0.0–10.0)
Monocytes Absolute: 0.4 10*3/uL (ref 0.00–0.90)
Neutrophils %: 54.5 % (ref 50.0–65.0)
Neutrophils Absolute: 3.9 10*3/uL (ref 1.5–7.5)
Platelets: 202 10*3/uL (ref 130–400)
RBC: 5.01 M/uL (ref 4.20–5.40)
RDW: 12.5 % (ref 11.5–14.5)
WBC: 7.2 10*3/uL (ref 4.8–10.8)

## 2014-05-12 LAB — MICROSCOPIC URINALYSIS
Bacteria, UA: NEGATIVE /HPF
Epithelial Cells, UA: 4 /HPF (ref 0–5)
Hyaline Casts, UA: 3 /HPF (ref 0–8)
RBC, UA: 6 /HPF — ABNORMAL HIGH (ref 0–4)
WBC, UA: 4 /HPF (ref 0–5)

## 2014-05-12 LAB — COMPREHENSIVE METABOLIC PANEL
ALT: 37 U/L — ABNORMAL HIGH (ref 5–33)
AST: 17 U/L (ref 5–32)
Albumin: 4.6 g/dL (ref 3.5–5.2)
Alkaline Phosphatase: 83 U/L (ref 35–104)
Anion Gap: 17 mmol/L (ref 7–19)
BUN: 20 mg/dL (ref 6–20)
CO2: 22 mmol/L (ref 22–29)
Calcium: 10.4 mg/dL — ABNORMAL HIGH (ref 8.6–10.0)
Chloride: 104 mmol/L (ref 98–111)
Creatinine: 1.2 mg/dL — ABNORMAL HIGH (ref 0.5–0.9)
GFR Non-African American: 46 — AB (ref >60)
Globulin: 2.4 g/dL
Glucose: 104 mg/dL (ref 74–109)
Potassium: 4.1 mmol/L (ref 3.5–5.0)
Sodium: 143 mmol/L (ref 136–145)
Total Bilirubin: 0.4 mg/dL (ref 0.2–1.2)
Total Protein: 7 g/dL (ref 6.6–8.7)

## 2014-05-12 LAB — URINALYSIS
Bilirubin Urine: NEGATIVE
Blood, Urine: NEGATIVE
Glucose, Ur: NEGATIVE mg/dL
Ketones, Urine: NEGATIVE mg/dL
Nitrite, Urine: NEGATIVE
Protein, UA: NEGATIVE mg/dL
Specific Gravity, UA: 1.012 (ref 1.005–1.030)
Urobilinogen, Urine: 0.2 E.U./dL (ref <2.0)
pH, UA: 6 (ref 5.0–8.0)

## 2014-05-12 LAB — URINE DRUG SCREEN
Amphetamine Screen, Urine: NEGATIVE (ref <1000)
Barbiturate Screen, Ur: NEGATIVE (ref <200)
Benzodiazepine Screen, Urine: NEGATIVE (ref <100)
Cannabinoid Scrn, Ur: NEGATIVE (ref <50)
Cocaine Metabolite Screen, Urine: NEGATIVE (ref <300)
Opiate Scrn, Ur: NEGATIVE (ref <300)

## 2014-05-12 LAB — TSH: TSH: 1.75 u[IU]/mL (ref 0.27–4.20)

## 2014-05-12 LAB — POCT GLUCOSE: POC Glucose: 77 mg/dl (ref 70–99)

## 2014-05-12 LAB — T4, FREE: T4 Free: 1.1 ng/ml (ref 0.9–1.7)

## 2014-05-12 LAB — ETHANOL: Ethanol Lvl: <10 mg/dL

## 2014-05-12 MED ORDER — LINACLOTIDE 145 MCG PO CAPS
145 MCG | Freq: Every day | ORAL | Status: DC
Start: 2014-05-12 — End: 2014-05-16
  Administered 2014-05-13 – 2014-05-16 (×4): 290 ug via ORAL

## 2014-05-12 MED ORDER — BUPROPION HCL ER (SR) 150 MG PO TB12
150 MG | Freq: Every day | ORAL | Status: DC
Start: 2014-05-12 — End: 2014-05-16
  Administered 2014-05-12 – 2014-05-16 (×5): 150 mg via ORAL

## 2014-05-12 MED ORDER — INSULIN LISPRO 100 UNIT/ML SC SOLN
100 UNIT/ML | Freq: Three times a day (TID) | SUBCUTANEOUS | Status: DC
Start: 2014-05-12 — End: 2014-05-16
  Administered 2014-05-13: 21:00:00 1 [IU] via SUBCUTANEOUS

## 2014-05-12 MED ORDER — FAMOTIDINE 20 MG PO TABS
20 MG | Freq: Two times a day (BID) | ORAL | Status: DC
Start: 2014-05-12 — End: 2014-05-16
  Administered 2014-05-13 – 2014-05-16 (×8): 20 mg via ORAL

## 2014-05-12 MED ORDER — INSULIN LISPRO 100 UNIT/ML SC SOLN
100 UNIT/ML | Freq: Every evening | SUBCUTANEOUS | Status: DC
Start: 2014-05-12 — End: 2014-05-16

## 2014-05-12 MED ORDER — MAGNESIUM HYDROXIDE 400 MG/5ML PO SUSP
400 MG/5ML | Freq: Every day | ORAL | Status: DC | PRN
Start: 2014-05-12 — End: 2014-05-16

## 2014-05-12 MED ORDER — OXCARBAZEPINE 150 MG PO TABS
150 MG | Freq: Two times a day (BID) | ORAL | Status: DC
Start: 2014-05-12 — End: 2014-05-16
  Administered 2014-05-13 – 2014-05-16 (×8): 150 mg via ORAL

## 2014-05-12 MED ORDER — TRAZODONE HCL 50 MG PO TABS
50 MG | Freq: Every evening | ORAL | Status: DC | PRN
Start: 2014-05-12 — End: 2014-05-12

## 2014-05-12 MED ORDER — TRAZODONE HCL 100 MG PO TABS
100 MG | Freq: Every evening | ORAL | Status: DC | PRN
Start: 2014-05-12 — End: 2014-05-16
  Administered 2014-05-13 – 2014-05-15 (×2): 100 mg via ORAL

## 2014-05-12 MED ORDER — GLIPIZIDE 10 MG PO TABS
10 MG | Freq: Every day | ORAL | Status: DC
Start: 2014-05-12 — End: 2014-05-16
  Administered 2014-05-13 – 2014-05-16 (×4): 10 mg via ORAL

## 2014-05-12 MED ORDER — METFORMIN HCL 500 MG PO TABS
500 MG | Freq: Two times a day (BID) | ORAL | Status: DC
Start: 2014-05-12 — End: 2014-05-16
  Administered 2014-05-12 – 2014-05-16 (×8): 1000 mg via ORAL

## 2014-05-12 MED ORDER — THERAPEUTIC MULTIVIT/MINERAL PO TABS
Freq: Every day | ORAL | Status: DC
Start: 2014-05-12 — End: 2014-05-16
  Administered 2014-05-13 – 2014-05-16 (×4): 1 via ORAL

## 2014-05-12 MED ORDER — QUETIAPINE FUMARATE 100 MG PO TABS
100 MG | Freq: Every evening | ORAL | Status: DC
Start: 2014-05-12 — End: 2014-05-16
  Administered 2014-05-13 – 2014-05-16 (×4): 200 mg via ORAL

## 2014-05-12 MED ORDER — LUBIPROSTONE 24 MCG PO CAPS
24 MCG | Freq: Two times a day (BID) | ORAL | Status: DC
Start: 2014-05-12 — End: 2014-05-16
  Administered 2014-05-12 – 2014-05-16 (×8): 24 ug via ORAL

## 2014-05-12 MED ORDER — ACETAMINOPHEN 325 MG PO TABS
325 MG | ORAL | Status: DC | PRN
Start: 2014-05-12 — End: 2014-05-16
  Administered 2014-05-13 – 2014-05-15 (×3): 650 mg via ORAL

## 2014-05-12 MED ORDER — TRAMADOL HCL 50 MG PO TABS
50 MG | Freq: Four times a day (QID) | ORAL | Status: DC | PRN
Start: 2014-05-12 — End: 2014-05-16

## 2014-05-12 MED FILL — HUMALOG 100 UNIT/ML SC SOLN: 100 UNIT/ML | SUBCUTANEOUS | Qty: 3

## 2014-05-12 MED FILL — BUPROPION HCL ER (SR) 150 MG PO TB12: 150 MG | ORAL | Qty: 1

## 2014-05-12 MED FILL — AMITIZA 24 MCG PO CAPS: 24 MCG | ORAL | Qty: 1

## 2014-05-12 MED FILL — METFORMIN HCL 500 MG PO TABS: 500 MG | ORAL | Qty: 2

## 2014-05-12 NOTE — Behavioral Health Treatment Team (Signed)
[facility] on the senior/gero unit per Dr Aubery LappingYoder [id number]    Electronically signed by Clenton Pareawn M Blanchard, CSW on 05/12/2014 at 4:06 PM

## 2014-05-12 NOTE — Behavioral Health Treatment Team (Signed)
`  [facility]  Admission Note     Admission Type:   Admission Type: Voluntary    Reason for admission:  Reason for Admission: depression    PATIENT STRENGTHS:  Strengths: Communication, Medication Compliance, No significant Physical Illness, Positive Support    Patient Strengths and Limitations:  Limitations: Difficulty problem solving/relies on others to help solve problems    Addictive Behavior:   Addictive Behavior  In the past 3 months, have you felt or has someone told you that you have a problem with:  : None  Histroy of Prescripton Drug Abuse?: Yes (1990)    Medical Problems:   Past Medical History   Diagnosis Date   ??? Pleurisy without mention of effusion or current tuberculosis    ??? Unspecified asthma(493.90)    ??? Unspecified hemorrhoids without mention of complication    ??? Abdominal pain, right lower quadrant    ??? Unspecified constipation    ??? Arthritis    ??? Bipolar disorder (HCC)    ??? MVP (mitral valve prolapse)    ??? Cancer (HCC)      skin cancer   ??? 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 Stomach ulcer      as a child   ??? Blood circulation, collateral    ??? Neuromuscular disorder (HCC) Inguinal Nerve pain   ??? Diabetes mellitus (HCC)    ??? Hyperlipidemia    ??? Right groin pain 02/28/2014       Status EXAM:  Status and Exam  Normal: No  Facial Expression: Flat, Sad  Affect: Congruent  Level of Consciousness: Alert  Mood:Normal: No  Mood: Depressed, Anxious, Sad  Motor Activity:Normal: Yes  Interview Behavior: Cooperative  Preception: Orient to Person, Orient to Time, Orient to Place, Orient to Situation  Attention:Normal: No  Attention: Unable to Concentrate  Thought Processes: Flt.of Ideas  Thought Content:Normal: No  Thought Content: Obsessions, Preoccupations  Hallucinations: None  Delusions: No  Memory:Normal: No  Memory: Poor Recent, Poor Remote  Insight and Judgment: No  Insight and Judgment: Poor Judgment  Present Suicidal Ideation: No  Present Homicidal Ideation: No    Pt  admitted with followings belongings:  Dentures: None  Vision - Corrective Lenses: Glasses  Hearing Aid: None  Jewelry: None  Body Piercings Removed: N/A  Clothing: Shirt, Jacket / coat, Footwear, Pants, Socks, Undergarments (Comment)  Were All Patient Medications Collected?: Not Applicable  Other Valuables: None     Patient and belongings searched per protocol.    Valuables sent home with her husband.  Patient's home medications were not brought in by patient.  Patient oriented to surroundings and program expectations and copy of patient rights given. Received admission packet: during the RN assessment.  Consents reviewed, signed and witnessed. Patient verbalize understanding:of unit rules and policies.  Also expectations of her to be compliant with treatment, groups, and medications.  Patient education on precautions: suicidal precautions, fall precautions.                   Michaele Offeronnie L Pittman, RN

## 2014-05-12 NOTE — ED Notes (Signed)
Bed: 15  Expected date:   Expected time:   Means of arrival:   Comments:  X

## 2014-05-12 NOTE — Behavioral Health Treatment Team (Signed)
ER called to give report. I made them aware of the situation that we had an admission in progress, and that my charge nurse asked for a little time before the patient was brought up to our unit. A few minutes later the ER charge nurse called me to inform me that she had a unit full of patients, and "we need to get this one out." I gave her the same information, and she required that I accommodate her. I offered to take report, but again said we needed a little time. The previous nurse gave me report, and within about 5 minutes, the patient was brought to our unit. I was prepared to take the patient, to whom I gave a friendly greeting, having cared for her here, a couple months ago, but when I asked the staff member doing the transporting in this case, who never identified herself, if she had brought the admission signature page we must have to execute intake, she relied "We ain't got one". I informed her in a calm tone that we must have it to go forward, and she replied, "They didn't give me one, you got to take this patient". The tech standing behind me confirmed the fact to the staff member, who then said, "You all need to learn how to react in front of a patient, cause that don't work". She took the patient back to ER. At no time did we raise our voices, or speak with disrespect, but simply stated fact and requested a document.

## 2014-05-12 NOTE — Consults (Signed)
[facility] per Dr Aubery LappingYoder [phone]    Electronically signed by Clenton Pareawn M Blanchard, CSW on 05/12/2014 at 4:07 PM

## 2014-05-12 NOTE — ED Notes (Signed)
Patient states she has been out of her psych meds for 2 weeks due to insurance issues.  Patient states she has been angry because of issues with insurance and with money.  States she is not suicidal but that she would like "to kill Bevin because he put her in this mess".    Marletta LorStacy M Young, RN  05/12/14 1235

## 2014-05-12 NOTE — Behavioral Health Treatment Team (Signed)
ED Psychiatry Initial Intake  Called 1333  Arrived 1344    05/12/2014    Joyce Cohen,a 59 y.o. female, presents via self/husband with depression, anxiety, and self harm actions.  Pt is coopeartive, alert, oriented, friendly.  Pt presents tearful at times to the point of having to stop the interview to catch her breath.  Pt reports out of medication for 2 weeks due to lapse in insurance coverage.  Pt attempted to "spread out my meds to cover but it didn't work."  Pt has extensive hx with self harm and this is current today as well - pt hits self in head.  Pt is denying suicidal intent and denies a plan; however, states, "I'm getting there.  I've been there before, and I know it's coming.  I thought I could just come here and get my meds to make me better, but I think it's past that.  I waited too late."    Pt has hx at this facility.  [facility] would accept pt, but we are currently without any bed space.    History:     Past Psychiatric History:   Previous therapy: yes  Previous psychiatric treatment and medication trials: yes  Previous psychiatric hospitalizations: yes  Previous diagnoses: yes  Previous suicide attempts:yes  History of violence: no  Currently in treatment with [facility].  Other Pertinent History: Financial, childhood trauma    Substance Abuse History:  Pt denies current use and denies hx of use    Patient Active Problem List    Diagnosis Date Noted   ??? Major depression, recurrent, chronic (HCC) 02/03/2014     Priority: High   ??? Right groin pain 02/28/2014   ??? Ilioinguinal neuralgia of right side    ??? Hypercholesteremia 02/04/2014   ??? Vitamin D deficiency 02/04/2014   ??? Suicidal ideations    ??? Obesity (BMI 30-39.9) 02/03/2014   ??? MVP (mitral valve prolapse) 02/03/2014   ??? Type 2 diabetes mellitus (HCC) 02/03/2014   ??? Right lumbar radiculopathy 02/03/2014   ??? Chronic constipation 02/03/2014   ??? Chronic constipation 12/30/2013   ??? RLQ abdominal pain 06/30/2012   ??? Rectal  bleeding 06/30/2012   ??? Constipation 06/30/2012     Past Medical History   Diagnosis Date   ??? Pleurisy without mention of effusion or current tuberculosis    ??? Unspecified asthma(493.90)    ??? Unspecified hemorrhoids without mention of complication    ??? Abdominal pain, right lower quadrant    ??? Unspecified constipation    ??? Arthritis    ??? Bipolar disorder (HCC)    ??? MVP (mitral valve prolapse)    ??? Cancer (HCC)      skin cancer   ??? 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 Stomach ulcer      as a child   ??? Blood circulation, collateral    ??? Neuromuscular disorder (HCC) Inguinal Nerve pain   ??? Diabetes mellitus (HCC)    ??? Hyperlipidemia    ??? Right groin pain 02/28/2014      Past Surgical History   Procedure Laterality Date   ??? Rotator cuff repair Right    ??? Tonsillectomy     ??? Hysterectomy     ??? Appendectomy     ??? Wisdom tooth extraction     ??? Abdominal exploration surgery       times two due to adhesions   ??? Colonoscopy  05/19/11  Dr Renato Gails   ??? Colonoscopy  07/15/2012     Dr. Zollie Beckers   ??? Skin biopsy  L and R wrists   ??? Abdomen surgery     ??? Colon surgery  04/2013     1 polyp removed      Not in a hospital admission.  Allergies   Allergen Reactions   ??? Tobacco [Nicotiana Tabacum] Shortness Of Breath   ??? Decongestant [Pseudoephedrine Hcl]    ??? Motrin [Ibuprofen]    ??? Cabbage Nausea And Vomiting      History   Substance Use Topics   ??? Smoking status: Never Smoker    ??? Smokeless tobacco: Never Used   ??? Alcohol Use: No      Family History   Problem Relation Age of Onset   ??? Alcohol Abuse Father    ??? Alcohol Abuse Mother    ??? Mental Illness Mother    ??? Hypertension Mother    ??? Mental Illness Maternal Grandmother    ??? Hypertension Maternal Grandmother    ??? Alcohol Abuse Brother    ??? Mental Illness Daughter    ??? Cancer Paternal Grandfather    ??? Colon Cancer Neg Hx    ??? Colon Polyps Neg Hx    ??? Liver Cancer Neg Hx    ??? Stomach Cancer Neg Hx    ??? Ulcerative Colitis Neg Hx    ??? Crohn's Disease Neg Hx    ??? Liver  Disease Neg Hx    ??? Rectal Cancer Neg Hx         Record Review:  moderate      Review Of Systems:     Psychiatric Review Of Systems:  sleep: yes - sleeping 4 hours per night  appetite changes: no  weight changes: no  energy/anergy: yes - "none at all"  interest/pleasure/anhedonia: yes  somatic symptoms: no  libido: yes  anxiety/panic: yes  guilty/hopeless: yes  S.I.B.s/risky behavior: no  any drugs: no  alcohol: no     Current Evaluation:       Mental Status Evaluation:  Appearance:  disheveled   Behavior:  Restless & fidgety   Speech:  normal pitch, normal volume and pressured   Mood:  anxious and depressed   Affect:  normal and mood-congruent   Thought Process:  within normal limits   Thought Content:  WNL   Sensorium:  person, place, time/date, situation, day of week, month of year and year   Cognition:  grossly intact   Insight:  impaired due to current mental status   Judgment:  impaired due to current mental status       Suicidal Intentions: No  Suicidal Plan:  No      Assessment - Diagnosis - Goals:     Treatment Plan/Recommendations:  Pt meets criteria [facility] hospitalization.  Pt is voluntary and requesting hospitalization for medication management, symptom stabilization, & safety "I'm not sure how long I'll be safe like this."    Referral to:  Inpatient hospitalization, to begin immediately    Review with patient: Treatment plan reviewed with the patient.    Dawn Glennie Hawk    Electronically signed by Clenton Pare, CSW on 05/12/2014 at 2:31 PM

## 2014-05-12 NOTE — Progress Notes (Signed)
[facility].  Included [facility] intake contact info for return call for disposition.    Electronically signed by Clenton Pareawn M Blanchard, CSW on 05/12/2014 at 3:14 PM

## 2014-05-12 NOTE — ED Provider Notes (Signed)
[facility] EMERGENCY DEPT  eMERGENCY dEPARTMENT eNCOUnter      Pt Name: Joyce Cohen  MRN: [id number]  Birthdate 12/05/1955  Date of evaluation: 05/12/2014  Provider: Deretha Emoryhristopher Sung Renton, MD    CHIEF COMPLAINT       Chief Complaint   Patient presents with   ??? Mental Health Problem         HISTORY OF PRESENT ILLNESS   (Location/Symptom, Timing/Onset, Context/Setting, Quality, Duration, Modifying Factors, Severity)  Note limiting factors.   Joyce CoBreandan A Cohen is a 59 y.o. female who presents to the emergency department      Patient is a 59 y.o. female presenting with mental health disorder. The history is provided by the patient and the spouse.   Mental Health Problem  Presenting symptoms: no suicidal thoughts    Presenting symptoms comment:  Anxiety, hitting herself in the head, "angry", crying spells  Patient accompanied by:  Spouse  Degree of incapacity (severity):  Severe  Onset quality:  Gradual  Duration:  2 weeks  Timing:  Constant  Progression:  Worsening  Context: noncompliance (no bipolar meds for 2 weeks)    Context: not alcohol use and not drug abuse    Treatment compliance:  Most of the time  Relieved by:  None tried  Worsened by:  Nothing tried  Ineffective treatments:  None tried  Associated symptoms: anxiety and insomnia    Risk factors: hx of mental illness        Nursing Notes were reviewed.    REVIEW OF SYSTEMS    (2-9 systems for level 4, 10 or more for level 5)     Review of Systems   Psychiatric/Behavioral: Negative for suicidal ideas. The patient is nervous/anxious and has insomnia.    All other systems reviewed and are negative.      Except as noted above the remainder of the review of systems was reviewed and negative.       PAST MEDICAL HISTORY     Past Medical History   Diagnosis Date   ??? Pleurisy without mention of effusion or current tuberculosis    ??? Unspecified asthma(493.90)    ??? Unspecified hemorrhoids without mention of complication    ??? Abdominal pain, right lower quadrant    ???  Unspecified constipation    ??? Arthritis    ??? Bipolar disorder (HCC)    ??? MVP (mitral valve prolapse)    ??? Cancer (HCC)      skin cancer   ??? 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 Stomach ulcer      as a child   ??? Blood circulation, collateral    ??? Neuromuscular disorder (HCC) Inguinal Nerve pain   ??? Diabetes mellitus (HCC)    ??? Hyperlipidemia    ??? Right groin pain 02/28/2014         SURGICAL HISTORY       Past Surgical History   Procedure Laterality Date   ??? Rotator cuff repair Right    ??? Tonsillectomy     ??? Hysterectomy     ??? Appendectomy     ??? Wisdom tooth extraction     ??? Abdominal exploration surgery       times two due to adhesions   ??? Colonoscopy  05/19/11     Dr Renato Gailseed   ??? Colonoscopy  07/15/2012     Dr. Zollie BeckersHendon   ??? Skin biopsy  L and R wrists   ??? Abdomen surgery     ???  Colon surgery  04/2013     1 polyp removed         CURRENT MEDICATIONS       Previous Medications    BUPROPION SR (WELLBUTRIN SR) 150 MG SR TABLET    Take 150 mg by mouth daily     CHOLECALCIFEROL (VITAMIN D3) 50000 UNITS CAPS    Take 50,000 Units by mouth once a week Take on Sunday for 11 weeks. Started on 12/15/13    FAMOTIDINE (PEPCID) 20 MG TABLET    Take 20 mg by mouth 2 times daily    GLIPIZIDE (GLUCOTROL) 10 MG TABLET    Take 1 tablet by mouth every morning (before breakfast)    INSULIN GLARGINE (LANTUS) 100 UNIT/ML INJECTION VIAL    Inject 12 Units into the skin nightly    LIDOCAINE-PRILOCAINE (EMLA) 2.5-2.5 % CREAM    Use as directed. Pt may take hospital supply.    LINACLOTIDE (LINZESS) 290 MCG CAPS CAPSULE    Take 1 capsule by mouth every morning (before breakfast)    LUBIPROSTONE (AMITIZA) 24 MCG CAPSULE    Take 1 capsule by mouth 2 times daily (with meals)    METFORMIN (GLUCOPHAGE) 1000 MG TABLET    Take 1 tablet by mouth 2 times daily (with meals)    MULTIPLE VITAMINS-MINERALS (THERAPEUTIC MULTIVITAMIN-MINERALS) TABLET    Take 1 tablet by mouth daily    OXCARBAZEPINE (TRILEPTAL) 150 MG TABLET    Take 150 mg by mouth 2  times daily    QUETIAPINE (SEROQUEL) 50 MG TABLET    Take 400 mg by mouth nightly     TRAMADOL (ULTRAM) 50 MG TABLET    Take 50 mg by mouth every 6 hours as needed for Pain    TRAZODONE (DESYREL) 150 MG TABLET    Take 100 mg by mouth nightly        ALLERGIES     Tobacco; Decongestant; Motrin; and Cabbage    FAMILY HISTORY       Family History   Problem Relation Age of Onset   ??? Alcohol Abuse Father    ??? Alcohol Abuse Mother    ??? Mental Illness Mother    ??? Hypertension Mother    ??? Mental Illness Maternal Grandmother    ??? Hypertension Maternal Grandmother    ??? Alcohol Abuse Brother    ??? Mental Illness Daughter    ??? Cancer Paternal Grandfather    ??? Colon Cancer Neg Hx    ??? Colon Polyps Neg Hx    ??? Liver Cancer Neg Hx    ??? Stomach Cancer Neg Hx    ??? Ulcerative Colitis Neg Hx    ??? Crohn's Disease Neg Hx    ??? Liver Disease Neg Hx    ??? Rectal Cancer Neg Hx           SOCIAL HISTORY       History     Social History   ??? Marital Status: Married     Spouse Name: Johnnie     Number of Children: 2   ??? Years of Education: 15     Social History Main Topics   ??? Smoking status: Never Smoker    ??? Smokeless tobacco: Never Used   ??? Alcohol Use: No   ??? Drug Use: No   ??? Sexual Activity: None     Other Topics Concern   ??? None     Social History Narrative       SCREENINGS  PHYSICAL EXAM    (up to 7 for level 4, 8 or more for level 5)   ED Triage Vitals   BP Temp Temp Source Pulse Resp SpO2 Height Weight   05/12/14 1235 05/12/14 1235 05/12/14 1235 05/12/14 1235 05/12/14 1235 05/12/14 1235 05/12/14 1235 05/12/14 1235   126/66 mmHg 98.5 ??F (36.9 ??C) Oral 101 16 95 % 5\' 5"  (1.651 m) 200 lb (90.719 kg)       Physical Exam   Constitutional: She is oriented to person, place, and time. No distress.   HENT:   Mouth/Throat: Oropharynx is clear and moist.   Eyes: EOM are normal. Pupils are equal, round, and reactive to light.   Neck: Normal range of motion. Neck supple.   Cardiovascular: Normal rate, regular rhythm and normal heart sounds.     Pulmonary/Chest: Effort normal and breath sounds normal.   Musculoskeletal: She exhibits no edema.   Neurological: She is alert and oriented to person, place, and time.   Skin: Skin is warm and dry. She is not diaphoretic.   Psychiatric:   Tearful during interview   Nursing note and vitals reviewed.      DIAGNOSTIC RESULTS     EKG: All EKG's are interpreted by the Emergency Department Physician who either signs or Co-signs this chart in the absence of a cardiologist.        RADIOLOGY:   Non-plain film images such as CT, Ultrasound and MRI are read by the radiologist. Plain radiographic images are visualized and preliminarily interpreted by the emergency physician with the below findings:        Interpretation per the Radiologist below, if available at the time of this note:    No orders to display         ED BEDSIDE ULTRASOUND:   Performed by ED Physician - none    LABS:  Labs Reviewed   CBC WITH AUTO DIFFERENTIAL - Abnormal; Notable for the following:     MPV 11.2 (*)     All other components within normal limits   COMPREHENSIVE METABOLIC PANEL - Abnormal; Notable for the following:     CREATININE 1.2 (*)     GFR Non-African American 46 (*)     Calcium 10.4 (*)     ALT 37 (*)     All other components within normal limits   URINALYSIS - Abnormal; Notable for the following:     Leukocyte Esterase, Urine SMALL (*)     All other components within normal limits   MICROSCOPIC URINALYSIS - Abnormal; Notable for the following:     RBC, UA 6 (*)     All other components within normal limits   URINE CULTURE   URINE DRUG SCREEN   ETHANOL       All other labs were within normal range or not returned as of this dictation.    EMERGENCY DEPARTMENT COURSE and DIFFERENTIAL DIAGNOSIS/MDM:   Vitals:    Filed Vitals:    05/12/14 1235   BP: 126/66   Pulse: 101   Temp: 98.5 ??F (36.9 ??C)   TempSrc: Oral   Resp: 16   Height: 5\' 5"  (1.651 m)   Weight: 200 lb (90.719 kg)   SpO2: 95%           MDM  Number of Diagnoses or Management  Options  Bipolar depression [facility]):   Diagnosis management comments: Psych sees in ED - admit.      CRITICAL CARE TIME   Total Critical  Care time was 0 minutes, excluding separately reportable procedures.  There was a high probability of clinically significant/life threatening deterioration in the patient's condition which required my urgent intervention.      CONSULTS:  IP CONSULT TO PSYCHIATRY    PROCEDURES:  Unless otherwise noted below, none     Procedures    FINAL IMPRESSION      1. Bipolar depression (HCC)          DISPOSITION/PLAN   DISPOSITION Decision to Admit    PATIENT REFERRED TO:  No follow-up provider specified.    DISCHARGE MEDICATIONS:  New Prescriptions    No medications on file          (Please note that portions of this note were completed with a voice recognition program.  Efforts were made to edit the dictations but occasionally words are mis-transcribed.)    Deretha Emory, MD (electronically signed)  Attending Emergency Physician        Deretha Emory, MD  05/12/14 [phone]

## 2014-05-12 NOTE — Progress Notes (Signed)
[facility] does not have a bed available at this time per Fredrik RiggerLisa Qualls (Intake).    Pt information given to Intake with [facility] [place] over the phone.  Intake requested labs, intake assessment, facesheet, and medical clearance statement from [facility] Phys[facility].  Gathered information and informed Dr Jamesetta OrleansManger of medical clearance statement needed.  When all information is gathered, I will fax to [facility] for possible admission.    Electronically signed by Clenton Pareawn M Blanchard, CSW on 05/12/2014 at 2:49 PM

## 2014-05-12 NOTE — Care Coordination-Inpatient (Signed)
Pt states she cannot afford her bi polar meds because the state is not paying anything to the Pharmacy.  Asked pt what pharmacy she used and if I could call and ask about her insurance. Pt stated her insurance was through [employer] and gave full permission to discuss her insurance and meds with her pharmacist.  She stated she used [facility].  [facility], pharmacist stated pt had a $3,000.00 - $4,000.00 deductible and it did not have anything to do with the state.  It was the type of insurance policy pt had.  Relayed this information to the pt.  Pt stated she did not know she had a deductible on medications.  Also discussed options to be able to afford meds.  Discussed applying for [facility].  Pt stated she already has and they turned her down because her husband makes too much money.  Pt was stating they were having a hard time affording their mortgage also.  Discussed the possibility of selling their house and moving into low income housing.  Discussed with pt and pt husband that this was just an option to lowering their bills.  Pt became very upset and stated "I have 2 dogs and they don't allow them in low income housing and if I have to sell my house I should just shoot myself right now so nobody has to worry about me."  Discussed with pt that she had been accepted to behavioral health here at [facility] and maybe she could work with them to get her less expensive meds.  Pt states "These are the only meds that work for me.  But, I am only taking my trazadone and seroquel for last couple of months."  Pt to be admitted to behavioral health here at [facility].  Above information discussed with Dawn SW from behavioral health.

## 2014-05-12 NOTE — ED Notes (Signed)
Pt brought back to ER by security and pca.  States charge nurse on geriatric side of 6th floor refused pt and stated "you had better just take her right back down to the ER."  Per pca, charge nurse's behavior inappropriate and unprofessional and upset pt and made her anxious and irritable.  Clinical House notified.  ER Manager notified.    Amado Nashonna Theophilus Walz, RN  05/12/14 1723

## 2014-05-12 NOTE — ED Notes (Signed)
Pt now at this time on her way back to 6th floor with security and nursing staff.    Amado Nashonna Cristhian Vanhook, RN  05/12/14 1723

## 2014-05-13 LAB — POCT GLUCOSE
POC Glucose: 111 mg/dl — ABNORMAL HIGH (ref 70–99)
POC Glucose: 100 mg/dl — ABNORMAL HIGH (ref 70–99)
POC Glucose: 81 mg/dl (ref 70–99)
POC Glucose: 156 mg/dl — ABNORMAL HIGH (ref 70–99)

## 2014-05-13 MED ORDER — DEXTROSE 5 % IV SOLN
5 % | INTRAVENOUS | Status: DC | PRN
Start: 2014-05-13 — End: 2014-05-16

## 2014-05-13 MED ORDER — GLUCOSE 40 % PO GEL
40 % | ORAL | Status: DC | PRN
Start: 2014-05-13 — End: 2014-05-16

## 2014-05-13 MED ORDER — SALINE SPRAY 0.65 % NA SOLN
0.65 % | NASAL | Status: DC | PRN
Start: 2014-05-13 — End: 2014-05-16
  Administered 2014-05-15: 22:00:00 1 via NASAL

## 2014-05-13 MED ORDER — PNEUMOCOCCAL VAC POLYVALENT 25 MCG/0.5ML IJ INJ
25 MCG/0.5ML | Freq: Once | INTRAMUSCULAR | Status: AC
Start: 2014-05-13 — End: 2014-05-13
  Administered 2014-05-13: 13:00:00 0.5 mL via INTRAMUSCULAR

## 2014-05-13 MED ORDER — GLUCAGON HCL RDNA (DIAGNOSTIC) 1 MG IJ SOLR
1 MG | INTRAMUSCULAR | Status: DC | PRN
Start: 2014-05-13 — End: 2014-05-16

## 2014-05-13 MED ORDER — DEXTROSE 50 % IV SOLN
50 % | INTRAVENOUS | Status: DC | PRN
Start: 2014-05-13 — End: 2014-05-16

## 2014-05-13 MED FILL — AMITIZA 24 MCG PO CAPS: 24 MCG | ORAL | Qty: 1

## 2014-05-13 MED FILL — BUPROPION HCL ER (SR) 150 MG PO TB12: 150 MG | ORAL | Qty: 1

## 2014-05-13 MED FILL — GLIPIZIDE 10 MG PO TABS: 10 MG | ORAL | Qty: 1

## 2014-05-13 MED FILL — ABC PLUS SENIOR PO TABS: ORAL | Qty: 1

## 2014-05-13 MED FILL — TYLENOL 325 MG PO TABS: 325 MG | ORAL | Qty: 2

## 2014-05-13 MED FILL — OXCARBAZEPINE 150 MG PO TABS: 150 MG | ORAL | Qty: 1

## 2014-05-13 MED FILL — METFORMIN HCL 500 MG PO TABS: 500 MG | ORAL | Qty: 2

## 2014-05-13 MED FILL — QUETIAPINE FUMARATE 100 MG PO TABS: 100 MG | ORAL | Qty: 2

## 2014-05-13 MED FILL — TRAZODONE HCL 100 MG PO TABS: 100 MG | ORAL | Qty: 1

## 2014-05-13 MED FILL — FAMOTIDINE 20 MG PO TABS: 20 MG | ORAL | Qty: 1

## 2014-05-13 MED FILL — LINZESS 145 MCG PO CAPS: 145 MCG | ORAL | Qty: 2

## 2014-05-13 MED FILL — PNEUMOVAX 23 25 MCG/0.5ML IJ INJ: 25 MCG/0.5ML | INTRAMUSCULAR | Qty: 0.5

## 2014-05-13 NOTE — Progress Notes (Signed)
RT leisure assessment was completed.  Pt was friendly and cooperative during assessment interview.  Pt will be encouraged to attend scheduled group activities to address social and leisure deficits.

## 2014-05-13 NOTE — Plan of Care (Signed)
Problem: Altered Mood, Depressive Behavior  Goal: STG-Knowledge of positive coping patterns  Outcome: Ongoing                                                                      Group Therapy Note    Date: 05/13/2014  Start Time: 1430  End Time:  1530  Number of Participants: 4    Type of Group: Cognitive Skills    Wellness Binder Information  Module Name:  Fun & Achievement  Session Number:  2    Patient's Goal:      Notes:  Pt attended group discussion.  Discussed the importance of increasing your activity level, identified a list of fun and enjoyable activities.    Status After Intervention:  Unchanged    Participation Level: Active Listener and Interactive    Participation Quality: Appropriate, Attentive and Sharing      Speech:  normal      Thought Process/Content: Logical      Affective Functioning: Congruent      Mood: depressed      Level of consciousness:  Alert and Oriented x4      Response to Learning: Able to verbalize current knowledge/experience, Able to verbalize/acknowledge new learning and Progressing to goal      Endings: None Reported    Modes of Intervention: Education      Discipline Responsible: Psychoeducational Specialist      Signature:  Clydell Hakim

## 2014-05-13 NOTE — Plan of Care (Signed)
Problem: Altered Mood, Depressive Behavior  Goal: LTG-Able to verbalize acceptance of life and situations over which he or she has no control  Outcome: Ongoing  Goal: LTG-Able to verbalize and/or display a decrease in depressive symptoms  Outcome: Ongoing  Goal: STG-Able to verbalize suicidal ideations  Outcome: Met This Shift  Goal: STG-Able to verbalize support system  Outcome: Ongoing  Goal: LTG-Absence of self-harm  Outcome: Met This Shift  Goal: STG-Knowledge of positive coping patterns  Outcome: Ongoing

## 2014-05-13 NOTE — Progress Notes (Signed)
@  [id number] Patient received Pneumonia injection left upper arm. Patient tolerated well.

## 2014-05-13 NOTE — Progress Notes (Signed)
@  [id number] Patient sitting in lounge watching television. Patient clean, groomed, ect casual dressed. ACTION 1:1 assessment completed x10 minutes. Medication regimen followed. RESPONSE) Patient speech clear. Patient motor WNL. Patient sleep pattern WNL. Patient appetite WNL. Patient behavior appropriate. Patient mood calm. Patient affect pleasant ect bright. Patient rates anxiety 4. Patient rates depression 5. Patient denies SI, HI, AVH. Patient attends ect participates in group. Patient pleasant ect cooperative with staff.
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
## 2014-05-13 NOTE — Progress Notes (Signed)
SW met with treatment team to discuss pt's progress and setbacks.  SW 2 was present.  Pt was admitted with depression, has hx of Bipolar DO, hx of previous psychiatric admissions, reports being unable to get her medications due to insurance difficulties.  Since admission, pt has been isolative, denies SI, continue current treatment plan.

## 2014-05-13 NOTE — H&P (Signed)
History and Physical      CHIEF COMPLAINT:  depression    Reason for Admission:  depression    History Obtained From:  patient    HISTORY OF PRESENT ILLNESS:      The patient is a 59 y.o. female who was [facility] with worsening mood issues. She denies any CP or SOA. No N/V or abdominal pain. No issues with recent illnesses or fevers. No dysuria.     Past Medical History:        Diagnosis Date   ??? Pleurisy without mention of effusion or current tuberculosis    ??? Unspecified asthma(493.90)    ??? Unspecified hemorrhoids without mention of complication    ??? Abdominal pain, right lower quadrant    ??? Unspecified constipation    ??? Arthritis    ??? Bipolar disorder (HCC)    ??? MVP (mitral valve prolapse)    ??? Cancer (HCC)      skin cancer   ??? 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 Stomach ulcer      as a child   ??? Blood circulation, collateral    ??? Neuromuscular disorder (HCC) Inguinal Nerve pain   ??? Diabetes mellitus (HCC)    ??? Hyperlipidemia    ??? Right groin pain 02/28/2014     Past Surgical History:        Procedure Laterality Date   ??? Rotator cuff repair Right    ??? Tonsillectomy     ??? Hysterectomy     ??? Appendectomy     ??? Wisdom tooth extraction     ??? Abdominal exploration surgery       times two due to adhesions   ??? Colonoscopy  05/19/11     Dr Renato Gailseed   ??? Colonoscopy  07/15/2012     Dr. Zollie BeckersHendon   ??? Skin biopsy  L and R wrists   ??? Abdomen surgery     ??? Colon surgery  04/2013     1 polyp removed         Medications Prior to Admission:    Prescriptions prior to admission: glipiZIDE (GLUCOTROL) 10 MG tablet, Take 1 tablet by mouth every morning (before breakfast)  metFORMIN (GLUCOPHAGE) 1000 MG tablet, Take 1 tablet by mouth 2 times daily (with meals)  Cholecalciferol (VITAMIN D3) 50000 UNITS CAPS, Take 50,000 Units by mouth once a week Take on Sunday for 11 weeks. Started on 12/15/13  OXcarbazepine (TRILEPTAL) 150 MG tablet, Take 150 mg by mouth 2 times daily  famotidine (PEPCID) 20 MG tablet, Take 20  mg by mouth 2 times daily  Multiple Vitamins-Minerals (THERAPEUTIC MULTIVITAMIN-MINERALS) tablet, Take 1 tablet by mouth daily  QUEtiapine (SEROQUEL) 50 MG tablet, Take 400 mg by mouth nightly   traZODone (DESYREL) 150 MG tablet, Take 100 mg by mouth nightly   buPROPion SR (WELLBUTRIN SR) 150 MG SR tablet, Take 150 mg by mouth daily   traMADol (ULTRAM) 50 MG tablet, Take 50 mg by mouth every 6 hours as needed for Pain  lubiprostone (AMITIZA) 24 MCG capsule, Take 1 capsule by mouth 2 times daily (with meals)  insulin glargine (LANTUS) 100 UNIT/ML injection vial, Inject 12 Units into the skin nightly  lidocaine-prilocaine (EMLA) 2.5-2.5 % cream, Use as directed. Pt may take hospital supply.  linaclotide (LINZESS) 290 MCG CAPS capsule, Take 1 capsule by mouth every morning (before breakfast)    Allergies:  Tobacco; Decongestant; Motrin; and Cabbage  Social History:   TOBACCO:   reports that she has never smoked. She has never used smokeless tobacco.  ETOH:   reports that she does not drink alcohol.  DRUGS:   reports that she does not use illicit drugs.  MARITAL STATUS:  married  OCCUPATION:  Not working  Patient currently lives with family       Family History:       Problem Relation Age of Onset   ??? Alcohol Abuse Father    ??? Alcohol Abuse Mother    ??? Mental Illness Mother    ??? Hypertension Mother    ??? Mental Illness Maternal Grandmother    ??? Hypertension Maternal Grandmother    ??? Alcohol Abuse Brother    ??? Mental Illness Daughter    ??? Cancer Paternal Grandfather    ??? Colon Cancer Neg Hx    ??? Colon Polyps Neg Hx    ??? Liver Cancer Neg Hx    ??? Stomach Cancer Neg Hx    ??? Ulcerative Colitis Neg Hx    ??? Crohn's Disease Neg Hx    ??? Liver Disease Neg Hx    ??? Rectal Cancer Neg Hx      REVIEW OF SYSTEMS:  Constitutional: neg  CV: neg  Pulmonary: neg  GI: neg  GU: neg  Psych: depression   Neuro: neg  Skin: neg  MusculoSkeletal: neg    Vitals:  BP 121/78 mmHg   Pulse 87   Temp(Src) 97.6 ??F (36.4 ??C) (Temporal)   Resp 18   Ht 5'  5" (1.651 m)   Wt 197 lb 6.4 oz (89.54 kg)   BMI 32.85 kg/m2   SpO2 98%    PHYSICAL EXAM:  Gen: NAD, alert  Neck: no JVD or LAD  Chest: CTA bilat  CV: RRR  Abdomen: NT/ND  Extrem: no C/C/E  Neuro: non focal  Skin: no rashes    DATA:  I have reviewed the admission labs and imaging tests.    ASSESSMENT AND PLAN:      Patient Active Hospital Problem List:   Bipolar disorder current episode depressed   Suicidal ideation    PTSD (post-traumatic stress disorder)    DM2   Chronic Pain   Mild Renal Insuff    I will follow closely. I have ordered additional labs. SHe is medically stable.               Danny Lawless, MD  8:41 PM 05/13/2014

## 2014-05-13 NOTE — Progress Notes (Signed)
SW completed psychosocial and CSSR-S with pt. Pt reports that she is feeling hopeless due to the fact that her insurance is no longer covering her medications. Pt reports that she "stretched" out her medications as long as possible however, realized she was feeling worse and came to the hospital. SW provided pt with encouragement and support. SW encouraged pt to attend groups offered throughout the days while on the unit.     Electronically signed by Thermon LeylandAmanda Wright, CSW on 05/13/2014 at 2:32 PM

## 2014-05-13 NOTE — Behavioral Health Treatment Team (Signed)
{  NURSE STANDARDS OF CARE: Progress Note    Data:  Patient was excited because she found out her insurance was going to pay for her meds. Patient has been out of her room all of p.m.is social with peers,no complaints.Affect is brighter,mood is stable.    Action: monitor, assist as needed,encourage to talk about issues with staff.    Response:  Calm,cooperative

## 2014-05-13 NOTE — Progress Notes (Signed)
Pt resting in bed with eyes closed, no complaints this shift, will continue 15 minute rounds

## 2014-05-13 NOTE — Plan of Care (Signed)
Problem: Altered Mood, Depressive Behavior  Goal: STG-Knowledge of positive coping patterns  Outcome: Ongoing                                                                      Group Therapy Note    Date: 05/13/2014  Start Time: 1030  End Time:  1130  Number of Participants: 7    Type of Group: Psychoeducation    Wellness Binder Information  Module Name:  What causes stress?  Session Number:      Patient's Goal:      Notes:  Pt attended group discussion.  Pt identified sources of stress, discussed effective stress management techniques.    Status After Intervention:  Unchanged    Participation Level: Active Listener and Interactive    Participation Quality: Appropriate, Attentive, Sharing and Supportive      Speech:  normal      Thought Process/Content: Logical      Affective Functioning: Flat      Mood: anxious and depressed      Level of consciousness:  Alert and Oriented x4      Response to Learning: Able to verbalize current knowledge/experience, Able to verbalize/acknowledge new learning and Progressing to goal      Endings: None Reported    Modes of Intervention: Education      Discipline Responsible: Psychoeducational Specialist      Signature:  Clydell Hakim

## 2014-05-14 LAB — POCT GLUCOSE
POC Glucose: 74 mg/dl (ref 70–99)
POC Glucose: 116 mg/dl — ABNORMAL HIGH (ref 70–99)
POC Glucose: 63 mg/dl — ABNORMAL LOW (ref 70–99)
POC Glucose: 68 mg/dl — ABNORMAL LOW (ref 70–99)
POC Glucose: 124 mg/dl — ABNORMAL HIGH (ref 70–99)
POC Glucose: 95 mg/dl (ref 70–99)

## 2014-05-14 LAB — BASIC METABOLIC PANEL
Anion Gap: 17 mmol/L (ref 7–19)
BUN: 18 mg/dL (ref 6–20)
CO2: 23 mmol/L (ref 22–29)
Calcium: 10.7 mg/dL — ABNORMAL HIGH (ref 8.6–10.0)
Chloride: 101 mmol/L (ref 98–111)
Creatinine: 1.3 mg/dL — ABNORMAL HIGH (ref 0.5–0.9)
GFR Non-African American: 42 — AB (ref >60)
Glucose: 136 mg/dL — ABNORMAL HIGH (ref 74–109)
Potassium: 4.2 mmol/L (ref 3.5–5.0)
Sodium: 141 mmol/L (ref 136–145)

## 2014-05-14 LAB — VITAMIN B12: Vitamin B-12: 642 pg/mL (ref 211–946)

## 2014-05-14 LAB — VITAMIN D 25 HYDROXY: Vit D, 25-Hydroxy: 32 ng/mL (ref >=30)

## 2014-05-14 LAB — CULTURE, URINE: Urine Culture, Routine: >50000

## 2014-05-14 MED FILL — TRAMADOL HCL 50 MG PO TABS: 50 MG | ORAL | Qty: 1

## 2014-05-14 MED FILL — OXCARBAZEPINE 150 MG PO TABS: 150 MG | ORAL | Qty: 1

## 2014-05-14 MED FILL — BUPROPION HCL ER (SR) 150 MG PO TB12: 150 MG | ORAL | Qty: 1

## 2014-05-14 MED FILL — AMITIZA 24 MCG PO CAPS: 24 MCG | ORAL | Qty: 1

## 2014-05-14 MED FILL — METFORMIN HCL 500 MG PO TABS: 500 MG | ORAL | Qty: 2

## 2014-05-14 MED FILL — FAMOTIDINE 20 MG PO TABS: 20 MG | ORAL | Qty: 1

## 2014-05-14 MED FILL — LINZESS 145 MCG PO CAPS: 145 MCG | ORAL | Qty: 2

## 2014-05-14 MED FILL — QUETIAPINE FUMARATE 100 MG PO TABS: 100 MG | ORAL | Qty: 2

## 2014-05-14 MED FILL — GLIPIZIDE 10 MG PO TABS: 10 MG | ORAL | Qty: 1

## 2014-05-14 MED FILL — ABC PLUS SENIOR PO TABS: ORAL | Qty: 1

## 2014-05-14 NOTE — Progress Notes (Signed)
@  [id number] Patient sitting in dining room. Patient clean ect groomed. ACTION 1:1 assessment completed x10 minutes. Medication regimen followed. RESPONSE) Patient speech clear. Patient motor WNL. Patient appetite WNL. Patient sleep pattern WNL.  Patient ADLs WNL. Patient behavior appropriate. Patient mood calm. Patient affect calm, brightened, ect pleasant. Patient rates anxiety 0. Patient rates depression 0. Patient denies SI, HI, AVH. Patient attends ect participates in group. Patient pleasant ect cooperative with staff.

## 2014-05-14 NOTE — Progress Notes (Signed)
D: Pt in common area  A: Assessed in 10 min  R: Pt is calm and cooperative this shift. She has eaten all, slept, socialized, and done ADLS. Her gait is steady, affect is bright, she reports no pain. She reports no depression, no anxiety, no SI, HI or AVH.
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
## 2014-05-14 NOTE — Plan of Care (Signed)
Problem: Altered Mood, Depressive Behavior  Goal: LTG-Able to verbalize acceptance of life and situations over which he or she has no control  Outcome: Met This Shift  Goal: LTG-Able to verbalize and/or display a decrease in depressive symptoms  Outcome: Met This Shift  Goal: STG-Able to verbalize suicidal ideations  Outcome: Met This Shift  Goal: STG-Able to verbalize support system  Outcome: Met This Shift  Goal: LTG-Absence of self-harm  Outcome: Met This Shift  Goal: STG-Knowledge of positive coping patterns  Outcome: Met This Shift

## 2014-05-14 NOTE — Progress Notes (Signed)
Pt resting with eyes closed, no complaints this shift, will continue 15 minute rounds

## 2014-05-14 NOTE — Other (Signed)
Group Therapy Note    Date: 05/14/2014  Start Time: 2000  End Time:  2030  Number of Participants: 4    Type of Group: Wrap-Up    Wellness Binder Information  Module Name:    Session Number:      Patient's Goal:      Notes:      Status After Intervention:  Improved    Participation Level: Active Listener and Interactive    Participation Quality: Appropriate, Attentive and Sharing      Speech:  normal      Thought Process/Content: Logical      Affective Functioning: Congruent and Flat      Mood: anxious and elevated      Level of consciousness:  Alert and Attentive      Response to Learning: Able to verbalize current knowledge/experience and Able to verbalize/acknowledge new learning      Endings: None Reported    Modes of Intervention: Socialization      Discipline Responsible: Stage managerBehavorial Health Tech      Signature:  Zebulon Z [employer]

## 2014-05-15 LAB — POCT GLUCOSE
POC Glucose: 96 mg/dl (ref 70–99)
POC Glucose: 115 mg/dl — ABNORMAL HIGH (ref 70–99)
POC Glucose: 72 mg/dl (ref 70–99)
POC Glucose: 107 mg/dl — ABNORMAL HIGH (ref 70–99)

## 2014-05-15 MED FILL — FAMOTIDINE 20 MG PO TABS: 20 MG | ORAL | Qty: 1

## 2014-05-15 MED FILL — ABC PLUS SENIOR PO TABS: ORAL | Qty: 1

## 2014-05-15 MED FILL — BUPROPION HCL ER (SR) 150 MG PO TB12: 150 MG | ORAL | Qty: 1

## 2014-05-15 MED FILL — QUETIAPINE FUMARATE 100 MG PO TABS: 100 MG | ORAL | Qty: 2

## 2014-05-15 MED FILL — LINZESS 145 MCG PO CAPS: 145 MCG | ORAL | Qty: 2

## 2014-05-15 MED FILL — TRAZODONE HCL 100 MG PO TABS: 100 MG | ORAL | Qty: 1

## 2014-05-15 MED FILL — OXCARBAZEPINE 150 MG PO TABS: 150 MG | ORAL | Qty: 1

## 2014-05-15 MED FILL — GLIPIZIDE 10 MG PO TABS: 10 MG | ORAL | Qty: 1

## 2014-05-15 MED FILL — METFORMIN HCL 500 MG PO TABS: 500 MG | ORAL | Qty: 2

## 2014-05-15 MED FILL — TYLENOL 325 MG PO TABS: 325 MG | ORAL | Qty: 2

## 2014-05-15 MED FILL — AMITIZA 24 MCG PO CAPS: 24 MCG | ORAL | Qty: 1

## 2014-05-15 MED FILL — DEEP SEA NASAL SPRAY 0.65 % NA SOLN: 0.65 % | NASAL | Qty: 44

## 2014-05-15 NOTE — Progress Notes (Signed)
@  0800 Patient sitting in dining room. Patient clean, groomed, ect casual dressed. ACTION 1:1 assessment completed x10 minutes. Medication regimen followed. RESPONSE) Patient speech clear. Patient motor WNL. Patient sleep pattern WNL. Patient appetite WNL. Patient ADLs WNL. Patient behavior appropriate. Patient mood calm. Patient social with peers on group. Patient attends ect participates in group. Patient rates anxiety 0. Patient rates depression 0. Patient denies SI, HI, AVH. Patient affect calm, brightened, ect calm. Patient pleasant ect cooperative with staff.

## 2014-05-15 NOTE — Progress Notes (Signed)
Group Therapy Note    Date: 05/15/2014  Start Time: 0800  End Time:  0815  Number of Participants: 3    Type of Group: Community Meeting      Patient's Goal:   "keep on keeping on"        Participation Level: Active Listener    Participation Quality: Appropriate and Attentive      Speech:  normal      Thought Process/Content: Logical      Affective Functioning: Congruent      Mood: calm      Level of consciousness:  Attentive and Drowsy      Response to Learning: Able to verbalize/acknowledge new learning      Modes of Intervention: Education and Support      Discipline Responsible: Stage manager II      Signature:  Sondra Barges

## 2014-05-15 NOTE — Other (Signed)
Group Therapy Note    Date: 05/15/2014  Start Time: 2015    End Time:  2040    Number of Participants: 5    Type of Group: Wrap-Up    Wellness Binder Information  Module Name:    Session Number:      Patient's Goal:      Notes:      Status After Intervention:  Improved    Participation Level: Active Listener and Interactive    Participation Quality: Appropriate, Attentive, Sharing and Supportive      Speech:  normal      Thought Process/Content: Logical      Affective Functioning: Congruent      Mood: elevated and euphoric      Level of consciousness:  Alert and Attentive      Response to Learning: Able to verbalize current knowledge/experience, Able to verbalize/acknowledge new learning and Able to retain information      Endings: None Reported    Modes of Intervention: Socialization and Activity      Discipline Responsible: [employer] Mutual [employer] OmahaBehavorial Health Tech      Signature:  Zebulon Z [employer]

## 2014-05-15 NOTE — Plan of Care (Signed)
Problem: Altered Mood, Depressive Behavior  Goal: LTG-Able to verbalize acceptance of life and situations over which he or she has no control  Outcome: Met This Shift  Goal: LTG-Able to verbalize and/or display a decrease in depressive symptoms  Outcome: Met This Shift  Goal: STG-Able to verbalize suicidal ideations  Outcome: Met This Shift  Goal: STG-Able to verbalize support system  Outcome: Met This Shift  Goal: LTG-Absence of self-harm  Outcome: Met This Shift  Goal: STG-Knowledge of positive coping patterns  Outcome: Met This Shift

## 2014-05-15 NOTE — Progress Notes (Signed)
Pt is calm and cooperative this shift. She has eaten, slept, socialized, and done ADLs. Her affect is brightened, gait is steady, and she reports minor sinus pain. She reports depression lessened, anxiety gone, no SI, HI or AVH.

## 2014-05-16 LAB — POCT GLUCOSE
POC Glucose: 125 mg/dl — ABNORMAL HIGH (ref 70–99)
POC Glucose: 108 mg/dl — ABNORMAL HIGH (ref 70–99)
POC Glucose: 57 mg/dl — ABNORMAL LOW (ref 70–99)

## 2014-05-16 MED ORDER — QUETIAPINE FUMARATE 200 MG PO TABS
200 MG | ORAL_TABLET | Freq: Every evening | ORAL | Status: DC
Start: 2014-05-16 — End: 2015-01-26

## 2014-05-16 MED ORDER — OXCARBAZEPINE 150 MG PO TABS
150 MG | ORAL_TABLET | Freq: Two times a day (BID) | ORAL | Status: DC
Start: 2014-05-16 — End: 2015-10-11

## 2014-05-16 MED ORDER — BUPROPION HCL ER (SR) 150 MG PO TB12
150 MG | ORAL_TABLET | Freq: Every day | ORAL | Status: DC
Start: 2014-05-16 — End: 2016-07-18

## 2014-05-16 MED ORDER — TRAZODONE HCL 100 MG PO TABS
100 MG | ORAL_TABLET | Freq: Every evening | ORAL | Status: DC | PRN
Start: 2014-05-16 — End: 2016-02-21

## 2014-05-16 MED ORDER — SALINE SPRAY 0.65 % NA SOLN
0.65 % | NASAL | Status: DC | PRN
Start: 2014-05-16 — End: 2015-01-26

## 2014-05-16 MED FILL — METFORMIN HCL 500 MG PO TABS: 500 MG | ORAL | Qty: 2

## 2014-05-16 MED FILL — ABC PLUS SENIOR PO TABS: ORAL | Qty: 1

## 2014-05-16 MED FILL — FAMOTIDINE 20 MG PO TABS: 20 MG | ORAL | Qty: 1

## 2014-05-16 MED FILL — AMITIZA 24 MCG PO CAPS: 24 MCG | ORAL | Qty: 1

## 2014-05-16 MED FILL — GLIPIZIDE 10 MG PO TABS: 10 MG | ORAL | Qty: 1

## 2014-05-16 MED FILL — OXCARBAZEPINE 150 MG PO TABS: 150 MG | ORAL | Qty: 1

## 2014-05-16 MED FILL — BUPROPION HCL ER (SR) 150 MG PO TB12: 150 MG | ORAL | Qty: 1

## 2014-05-16 MED FILL — QUETIAPINE FUMARATE 100 MG PO TABS: 100 MG | ORAL | Qty: 2

## 2014-05-16 MED FILL — LINZESS 145 MCG PO CAPS: 145 MCG | ORAL | Qty: 2

## 2014-05-16 MED FILL — TYLENOL 325 MG PO TABS: 325 MG | ORAL | Qty: 2

## 2014-05-16 NOTE — Progress Notes (Signed)
SW placed multiple calls to pt's husband to discuss doctor's recommendation of locking up and monitoring pt's medications at least until she is seen by her outpatient psychiatrist. Pt's husband did not answer his phone, and a recording was given each time stating, "This wireless customer is not currently accepting calls, please try again later."     Electronically signed by Thermon LeylandAmanda Wright, CSW on 05/16/2014 at 10:22 AM

## 2014-05-16 NOTE — Communication Body (Signed)
Assessment:      Plan:

## 2014-05-16 NOTE — Discharge Instructions (Signed)
1. Follow up with [facility] this Wednesday  2. Follow up [facility] for therapy and medication management  3. Recommend follow up with PCP within 10-14 days of discharge for general PE and review of meds and hospitalization
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
## 2014-05-16 NOTE — Plan of Care (Signed)
Problem: Altered Mood, Depressive Behavior  Goal: LTG-Able to verbalize acceptance of life and situations over which he or she has no control  Outcome: Ongoing                                                                      Group Therapy Note    Date: 05/16/2014  Start Time: 1000  End Time:  1045  Number of Participants: 7    Type of Group: Psychotherapy        Patient's Goal: Process emotions and actions focusing on how to heal and improve relationships with others.    Notes:  Pt attended agenda group as scheduled. Pt participated by identifying an issue she would like to work on today regarding how she interacts/relates with others. Pt shared with group that she feels that proper medication management is what helps her be able to feel stabilized and capable of having "better quality" relationships. Pt participated in group discussion exploring issues identified by herself and group members.    Status After Intervention:  Improved    Participation Level: Active Listener and Interactive    Participation Quality: Appropriate, Attentive, Sharing and Supportive      Speech:  normal      Thought Process/Content: Logical      Affective Functioning: Congruent      Mood: depressed      Level of consciousness:  Alert, Oriented x4 and Attentive      Response to Learning: Able to verbalize current knowledge/experience, Able to verbalize/acknowledge new learning, Capable of insight, Able to change behavior and Progressing to goal      Endings: None Reported    Modes of Intervention: Support, Socialization, Exploration and Clarifying      Discipline Responsible: Social Worker/Counselor      Signature:  Thermon Leyland, CSW

## 2014-05-16 NOTE — Progress Notes (Signed)
SW spoke with pt regarding discharge. Pt denies, SI, HI, and AVH at this time. Pt informed SW that she is feeling, "much, much better" and is looking forward to discharging home. Pt also informed SW that the insurance company is now going to be paying for her medications, as her husband worked everything out with their provider. SW provided pt with support and encouragement.  SW scheduled pt's behavioral follow up appointments as discussed in treatment team.     Electronically signed by Thermon LeylandAmanda Wright, CSW on 05/16/2014 at 10:18 AM

## 2014-05-16 NOTE — Progress Notes (Signed)
Progress Note  Joyce Cohen  05/16/2014 9:58 AM  Subjective:   Admit Date:   05/12/2014      CC/ADMIT DX:       Interval History:   Reviewed overnight events and nursing notes.  No new physical complaints.           I have reviewed all labs/diagnostics from the last 24hrs.       ROS:   I have done a 10 point ROS and all are negative, except what is mentioned in the HPI.    DIET GENERAL; Carb Control: 5 carbs/meal (approximate 2000 kcals/day)    Medications:     ??? dextrose       ??? buPROPion SR  150 mg Oral Daily   ??? famotidine  20 mg Oral BID   ??? glipiZIDE  10 mg Oral QAM AC   ??? linaclotide  290 mcg Oral QAM AC   ??? lubiprostone  24 mcg Oral BID WC   ??? metFORMIN  1,000 mg Oral BID WC   ??? therapeutic multivitamin-minerals  1 tablet Oral Daily   ??? OXcarbazepine  150 mg Oral BID   ??? QUEtiapine  200 mg Oral Nightly   ??? insulin lispro  0-6 Units Subcutaneous TID WC   ??? insulin lispro  0-3 Units Subcutaneous Nightly           Objective:   Vitals: BP 114/67 mmHg   Pulse 87   Temp(Src) 97.5 ??F (36.4 ??C) (Temporal)   Resp 20   Ht 5\' 5"  (1.651 m)   Wt 197 lb 4 oz (89.472 kg)   BMI 32.82 kg/m2   SpO2 96% No intake or output data in the 24 hours ending 05/16/14 0958  General appearance: alert and cooperative with exam    Assessment and Plan:   Active Problems:    Bipolar disorder current episode depressed (HCC)    Suicidal ideation    PTSD (post-traumatic stress disorder)    DM2    Plan:  1.  Continue present medication(s)   2.  She is stable for d/c  3.  F/U with PCP in 3-4 weeks      Discharge planning:   her home     Reviewed treatment plans with the patient and/or family.             Electronically signed by Danny LawlessAribbe A Adie Vilar, MD on 05/16/2014 at 9:58 AM

## 2014-05-18 ENCOUNTER — Inpatient Hospital Stay: Admit: 2014-05-18 | Payer: BLUE CROSS/BLUE SHIELD | Primary: Emergency Medicine

## 2014-05-18 ENCOUNTER — Encounter

## 2014-05-18 DIAGNOSIS — N183 Chronic kidney disease, stage 3 (moderate): Secondary | ICD-10-CM

## 2014-05-20 ENCOUNTER — Inpatient Hospital Stay: Payer: BLUE CROSS/BLUE SHIELD | Primary: Emergency Medicine

## 2014-06-02 ENCOUNTER — Inpatient Hospital Stay
Admit: 2014-06-02 | Discharge: 2014-06-02 | Payer: BLUE CROSS/BLUE SHIELD | Attending: Pain Medicine | Primary: Emergency Medicine

## 2014-06-02 DIAGNOSIS — R1031 Right lower quadrant pain: Secondary | ICD-10-CM

## 2014-06-02 MED ORDER — TRAMADOL HCL 50 MG PO TABS
50 MG | ORAL_TABLET | Freq: Four times a day (QID) | ORAL | Status: DC | PRN
Start: 2014-06-02 — End: 2014-07-19

## 2014-06-02 MED ORDER — NALOXEGOL OXALATE 25 MG PO TABS
25 MG | ORAL_TABLET | Freq: Every morning | ORAL | Status: DC
Start: 2014-06-02 — End: 2014-07-27

## 2014-06-02 NOTE — Progress Notes (Signed)
[facility]  Patient Pain Assessment      Primary Care Physician: Sharmon Leyden, MD    Chief complaint:   Chief Complaint   Patient presents with   . Abdominal Pain     Rt. lower abdominal pain.   Marland Kitchen    HISTORY OF PRESENT ILLNESS:    Joyce Cohen is 59 y.o. female who returns today complaining of pain in her right lower abdomin. Pain level is a 5.    She states she received 100% pain relief for about 4 months after her last right ilioinguinal nerve block injections. She has improved quite a bit with these injections. She is looking forward to repeating the injections in the near future. She states at her previous appointment she believe the cream was helpful but she is no longer using this. Prescribing Lidoderm patch. She has recently been diagnosed with diabetes. Recently diagnosed with Stage 3 renal failure so we will avoid NSAID's. .       She believes her medications are helpful. We again discussed the issues with daily narcotics. She has not been taking daily pain pills. She notes she is concerned with addiction. She notes constipation from her pain pills. Giving samples of Movantik. Patient is a non-smoker. We discussed her weight and the importance of exercising. She weighs 196 lbs today.    On exam she is very tender at the right Ilioinguinal nerve. No appreciable hernia.      Current Medication Regimen Working? Yes  Current Issues / Falls / ER Visits: No  Percentage of Pain Relief after Last Procedure: 100 % How long lasted: 4 months  Radiology exams received during the last 12 months: No   When na [facility]   Imaging on chart: No    Imaging records requested: No  Physical therapy during the last 12 months: No   When: na Where na  Labs during the last 12 months: Yes    Review of Systems:  Other than stated above in the HPI is negative      Zadie Rhine compliant? yes  Concern  for prescription abuse?no    Current Pain Assessment  Pain Assessment  Pain Assessment: 0-10  Pain Level: 5  Pain Type: Chronic pain  Pain Location: Abdomen  Pain Orientation: Right  Pain Descriptors: Aching, Sharp  Pain Frequency: Intermittent  Pain Onset: On-going  Clinical Progression: Not changed  Patient's Stated Pain Goal: No pain  Pain Intervention(s): Medication (see eMar), Heat applied, Cold applied, Repositioned, Rest                 ADVERSE MEDICATION EFFECTS:   Constipation: yes  Bowel Regimen: Yes  Diet: common adult  Appetite:  ok  Sedation:  no  Urinary Retention: no    FOCUSED PAIN SCALE:  Highest : 10  Lowest :1  Average: Range-5  When and What  was your last procedure:      Was your procedure effective:  yes    ACTIVITY/SOCIAL/EMOTIONAL:  Sleep Pattern: 5 hours per night. nightime awakenings  Energy Level:  Normal      ABERRANT BEHAVIORS SINCE LAST VISIT:  Have you ever been treated in another Pain Clinic not applicable  Refills for prescriptions appropriate: yes  Lost rx/pills: no  Taking more medication than prescribed:  no  Are you receiving PAIN medications from  other doctors: not applicable  Last Urine/Serum Drug Screen :  Was Serum/UDS as anticipated?  not applicable  Are currently pregnant? not applicable  Recent  ER visits: No             Past Medical History      Diagnosis Date   . Pleurisy without mention of effusion or current tuberculosis    . Unspecified asthma(493.90)    . Unspecified hemorrhoids without mention of complication    . Abdominal pain, right lower quadrant    . Unspecified constipation    . Arthritis    . Bipolar disorder (HCC)    . MVP (mitral valve prolapse)    . Cancer (HCC)      skin cancer   . 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 Stomach ulcer      as a child   . Blood circulation, collateral    . Neuromuscular disorder (HCC) Inguinal Nerve pain   . Diabetes mellitus (HCC)    . Hyperlipidemia    . Right groin pain 02/28/2014       Surgical  History  Past Surgical History   Procedure Laterality Date   . Rotator cuff repair Right    . Tonsillectomy     . Hysterectomy     . Appendectomy     . Wisdom tooth extraction     . Abdominal exploration surgery       times two due to adhesions   . Colonoscopy  05/19/11     Dr Renato Gails   . Colonoscopy  07/15/2012     Dr. Zollie Beckers   . Skin biopsy  L and R wrists   . Abdomen surgery     . Colon surgery  04/2013     1 polyp removed       Medications  Current Outpatient Prescriptions   Medication Sig Dispense Refill   . ciprofloxacin (CIPRO) 500 MG tablet      . sodium chloride (OCEAN, BABY AYR) 0.65 % nasal spray 1 spray by Nasal route as needed for Congestion 1 Bottle 0   . QUEtiapine (SEROQUEL) 200 MG tablet Take 1 tablet by mouth nightly 30 tablet 0   . traZODone (DESYREL) 100 MG tablet Take 1 tablet by mouth nightly as needed for Sleep 30 tablet 0   . buPROPion SR (WELLBUTRIN SR) 150 MG SR tablet Take 1 tablet by mouth daily 60 tablet 3   . OXcarbazepine (TRILEPTAL) 150 MG tablet Take 1 tablet by mouth 2 times daily 60 tablet 0   . traMADol (ULTRAM) 50 MG tablet Take 50 mg by mouth every 6 hours as needed for Pain     . lubiprostone (AMITIZA) 24 MCG capsule Take 1 capsule by mouth 2 times daily (with meals) 60 capsule 11   . glipiZIDE (GLUCOTROL) 10 MG tablet Take 1 tablet by mouth every morning (before breakfast) 60 tablet 3   . insulin glargine (LANTUS) 100 UNIT/ML injection vial Inject 12 Units into the skin nightly 1 vial 3   . metFORMIN (GLUCOPHAGE) 1000 MG tablet Take 1 tablet by mouth 2 times daily (with meals) 60 tablet 3   . lidocaine-prilocaine (EMLA) 2.5-2.5 % cream Use as directed. Pt may take hospital supply. 15 g 1   . Cholecalciferol (VITAMIN D3) 50000 UNITS CAPS Take 50,000 Units by mouth once a week Take on Sunday for 11 weeks. Started on 12/15/13     . famotidine (PEPCID) 20 MG tablet Take 20 mg by mouth 2 times daily     . Multiple Vitamins-Minerals (THERAPEUTIC MULTIVITAMIN-MINERALS) tablet Take 1  tablet by mouth daily  No current facility-administered medications for this encounter.       Allergies  Tobacco; Decongestant; Motrin; and Cabbage    Family History  family history includes Alcohol Abuse in her brother, father, and mother; Cancer in her paternal grandfather; Hypertension in her maternal grandmother and mother; Mental Illness in her daughter, maternal grandmother, and mother. There is no history of Colon Cancer, Colon Polyps, Liver Cancer, Stomach Cancer, Ulcerative Colitis, Crohn's Disease, Liver Disease, or Rectal Cancer.    Social History  History     Social History   . Marital Status: Married     Spouse Name: Johnnie     Number of Children: 2   . Years of Education: 15     Social History Main Topics   . Smoking status: Never Smoker    . Smokeless tobacco: Never Used   . Alcohol Use: No   . Drug Use: No   . Sexual Activity: None     Other Topics Concern   . None     Social History Narrative      reports that she does not use illicit drugs.        REVIEW OF SYSTEMS:  ROS         GENERAL PHYSICAL EXAM:  Vitals: BP 124/74 mmHg  Pulse 94  Temp(Src) 97.6 F (36.4 C) (Temporal)  Resp 18  Ht 5\' 5"  (1.651 m)  Wt 196 lb (88.905 kg)  BMI 32.62 kg/m2  SpO2 98%, Body mass index is 32.62 kg/(m^2).  Physical Exam Ortho Exam    DATA  Labs:  BENZODIAZEPINE SCREEN, URINE   Date Value Ref Range Status   05/12/2014 Negative Negative <100 ng/mL Final        Imaging:  Radiology Images and Reports reviewed where indicated and necessary    ASSESSMENT  Joyce Cohen is a 59 y.o. female with       PLAN  Se patient back in 1-2 months for planned ilioinguinal nerve block injections. Patient is to call with any questions or concerns which may arise prior to the next office visit.       Electronically signed by Noel Journey on 06/02/2014 at 1:21 PM

## 2014-06-02 NOTE — Progress Notes (Signed)
Nursing Admission Record    Current Medication Regimen Working?  Yes    Current Issues / Falls / ER Visits:  No    Percentage of Pain Relief after Last Procedure:  100 %    How long lasted:  4 months    Radiology exams received during the last 12 months: No       When na                                              [facility]       Imaging on chart: No         Imaging records requested: No  Physical therapy during the last 12 months: No       When: na                                             Where na  Labs during the last 12 months: Yes    Education Provided:  [x]  Review of [place]  []  Agreement Review  []  Compliance Issues Discussed    []  Cognitive Behavior Needs []  Exercise []  Review of Test []  Financial Issues  []  Tobacco/Alcohol Use [x]  Teaching []  New Patient []  Picture Obtained    Physician Plan:  []  Outgoing Referral  []  Pharmacy Consult  []  Test Ordered   []  Obtained Test Results / Consult Notes    []  Suspected Physical Abuse or Suicide Risk assessed - IF YES COMPLETE QUESTIONS BELOW    If any of the following questions are answered yes - contact attending physician for referral:    Has been considering harming self to escape stress, pain problems?  []  YES  []  NO  Has a suicide plan? []  YES  []  NO  Has attempted suicide in the past?   []  YES  []  NO  Has a close friend or family member who committed suicide?  []  YES  []  NO    Patient Referred To :     Additional Notes:    Assessment Completed by:  Electronically signed by Leeann Must, RN on 06/02/2014 at 1:06 PM

## 2014-06-13 ENCOUNTER — Inpatient Hospital Stay
Admit: 2014-06-13 | Discharge: 2014-06-13 | Disposition: A | Discharge status: Home / Self Care | Payer: BLUE CROSS/BLUE SHIELD | Attending: Emergency Medicine

## 2014-06-13 DIAGNOSIS — R51 Headache: Secondary | ICD-10-CM

## 2014-06-13 LAB — POCT GLUCOSE: POC Glucose: 144 mg/dl — ABNORMAL HIGH (ref 70–99)

## 2014-06-13 LAB — BASIC METABOLIC PANEL
Anion Gap: 15 mmol/L (ref 7–19)
BUN: 16 mg/dL (ref 6–20)
CO2: 22 mmol/L (ref 22–29)
Calcium: 9.5 mg/dL (ref 8.6–10.0)
Chloride: 103 mmol/L (ref 98–111)
Creatinine: 1 mg/dL — ABNORMAL HIGH (ref 0.5–0.9)
GFR Non-African American: 57 — AB (ref >60)
Glucose: 164 mg/dL — ABNORMAL HIGH (ref 74–109)
Potassium: 4 mmol/L (ref 3.5–5.0)
Sodium: 140 mmol/L (ref 136–145)

## 2014-06-13 MED ORDER — DIPHENHYDRAMINE HCL 50 MG/ML IJ SOLN
50 MG/ML | Freq: Once | INTRAMUSCULAR | Status: AC
Start: 2014-06-13 — End: 2014-06-13
  Administered 2014-06-13: 11:00:00 50 mg via INTRAVENOUS

## 2014-06-13 MED ORDER — METOCLOPRAMIDE HCL 5 MG/ML IJ SOLN
5 MG/ML | Freq: Once | INTRAMUSCULAR | Status: AC
Start: 2014-06-13 — End: 2014-06-13
  Administered 2014-06-13: 11:00:00 10 mg via INTRAVENOUS

## 2014-06-13 MED ORDER — SUMATRIPTAN SUCCINATE 6 MG/0.5ML SC SOLN
6 MG/0.5ML | Freq: Once | SUBCUTANEOUS | Status: AC
Start: 2014-06-13 — End: 2014-06-13
  Administered 2014-06-13: 11:00:00 6 mg via SUBCUTANEOUS

## 2014-06-13 MED FILL — SUMATRIPTAN SUCCINATE 6 MG/0.5ML SC SOLN: 6 MG/0.5ML | SUBCUTANEOUS | Qty: 0.5

## 2014-06-13 MED FILL — DIPHENHYDRAMINE HCL 50 MG/ML IJ SOLN: 50 MG/ML | INTRAMUSCULAR | Qty: 1

## 2014-06-13 MED FILL — METOCLOPRAMIDE HCL 5 MG/ML IJ SOLN: 5 MG/ML | INTRAMUSCULAR | Qty: 2

## 2014-06-13 NOTE — ED Notes (Signed)
PT sleeping quietly, family at bedside. PT skin warm and dry, respirations equal and unlabored.     Jarome Lamasody A Coleson Kant, RN  06/13/14 [phone]

## 2014-06-13 NOTE — ED Provider Notes (Signed)
[facility]  eMERGENCY dEPARTMENT eNCOUnter      Pt Name: Joyce Cohen  MRN: [id number]  Birthdate 1955-12-29  Date of evaluation: 06/13/2014  Provider: Sharon Seller, MD    CHIEF COMPLAINT       Chief Complaint   Patient presents with   ??? Headache     started yesterday   ??? Emesis         HISTORY OF PRESENT ILLNESS   (Location/Symptom, Timing/Onset, Context/Setting, Quality, Duration, Modifying Factors, Severity)  Note limiting factors.   Joyce Cohen is a 59 y.o. female who presents to the emergency department since with headache. The patient has a history of previous migraines she states she gets similar headaches as to what she is having now typically twice a year and has had them for many years. She states this headache is similar to her prior migraine headaches. She denies any thunderclap or sudden onset headache this is been gradual onset for 2 days and frontal in location. She denies any focal motor weakness numbness or tingling. She denies any fever or neck stiffness or any meningeal symptoms. There is been no history of trauma. She denies any prior history of malignancy.     Patient is a 59 y.o. female presenting with headaches.   Headache  Pain location:  Frontal  Quality:  Dull  Radiates to:  Does not radiate  Severity currently:  7/10  Severity at highest:  7/10  Onset quality:  Gradual  Duration:  2 days  Timing:  Constant  Progression:  Waxing and waning  Chronicity:  Recurrent  Similar to prior headaches: yes    Context: bright light    Relieved by:  Nothing  Worsened by:  Nothing  Ineffective treatments:  None tried  Associated symptoms: congestion, nausea and vomiting    Associated symptoms: no abdominal pain, no back pain, no blurred vision, no cough, no diarrhea, no dizziness, no drainage, no ear pain, no eye pain, no facial pain, no fatigue, no fever, no focal weakness, no hearing loss, no loss of balance, no myalgias, no near-syncope, no neck pain, no neck stiffness, no  numbness, no paresthesias, no sore throat, no swollen glands, no syncope, no tingling, no URI and no visual change        Nursing Notes were reviewed.    REVIEW OF SYSTEMS    (2-9 systems for level 4, 10 or more for level 5)     Review of Systems   Constitutional: Negative for fever and fatigue.   HENT: Positive for congestion. Negative for ear pain, hearing loss, postnasal drip and sore throat.    Eyes: Negative for blurred vision and pain.   Respiratory: Negative for cough.    Cardiovascular: Negative for syncope and near-syncope.   Gastrointestinal: Positive for nausea and vomiting. Negative for abdominal pain and diarrhea.   Musculoskeletal: Negative for myalgias, back pain, neck pain and neck stiffness.   Neurological: Positive for headaches. Negative for dizziness, focal weakness, numbness, paresthesias and loss of balance.       A complete review of systems was performed and is negative except as noted above in the HPI.       PAST MEDICAL HISTORY     Past Medical History   Diagnosis Date   ??? Pleurisy without mention of effusion or current tuberculosis    ??? Unspecified asthma(493.90)    ??? Unspecified hemorrhoids without mention of complication    ??? Abdominal pain, right lower quadrant    ???  Unspecified constipation    ??? Arthritis    ??? Bipolar disorder (HCC)    ??? MVP (mitral valve prolapse)    ??? Cancer (HCC)      skin cancer   ??? 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 Stomach ulcer      as a child   ??? Blood circulation, collateral    ??? Neuromuscular disorder (HCC) Inguinal Nerve pain   ??? Diabetes mellitus (HCC)    ??? Hyperlipidemia    ??? Right groin pain 02/28/2014         SURGICAL HISTORY       Past Surgical History   Procedure Laterality Date   ??? Rotator cuff repair Right    ??? Tonsillectomy     ??? Hysterectomy     ??? Appendectomy     ??? Wisdom tooth extraction     ??? Abdominal exploration surgery       times two due to adhesions   ??? Colonoscopy  05/19/11     Dr Renato Gails   ??? Colonoscopy  07/15/2012     Dr.  Zollie Beckers   ??? Skin biopsy  L and R wrists   ??? Abdomen surgery     ??? Colon surgery  04/2013     1 polyp removed         CURRENT MEDICATIONS       Previous Medications    BUPROPION SR (WELLBUTRIN SR) 150 MG SR TABLET    Take 1 tablet by mouth daily    CHOLECALCIFEROL (VITAMIN D3) 50000 UNITS CAPS    Take 50,000 Units by mouth once a week Take on Sunday for 11 weeks. Started on 12/15/13    FAMOTIDINE (PEPCID) 20 MG TABLET    Take 20 mg by mouth 2 times daily    GLIPIZIDE (GLUCOTROL) 10 MG TABLET    Take 1 tablet by mouth every morning (before breakfast)    LUBIPROSTONE (AMITIZA) 24 MCG CAPSULE    Take 1 capsule by mouth 2 times daily (with meals)    METFORMIN (GLUCOPHAGE) 1000 MG TABLET    Take 1 tablet by mouth 2 times daily (with meals)    MULTIPLE VITAMINS-MINERALS (THERAPEUTIC MULTIVITAMIN-MINERALS) TABLET    Take 1 tablet by mouth daily    NALOXEGOL (MOVANTIK) 25 MG TABS TABLET    Take 1 tablet by mouth every morning #6 samples given to Pt. For trial.    OXCARBAZEPINE (TRILEPTAL) 150 MG TABLET    Take 1 tablet by mouth 2 times daily    QUETIAPINE (SEROQUEL) 200 MG TABLET    Take 1 tablet by mouth nightly    SODIUM CHLORIDE (OCEAN, BABY AYR) 0.65 % NASAL SPRAY    1 spray by Nasal route as needed for Congestion    TRAMADOL (ULTRAM) 50 MG TABLET    Take 1 tablet by mouth every 6 hours as needed for Pain (may fill script 06/02/2014)    TRAZODONE (DESYREL) 100 MG TABLET    Take 1 tablet by mouth nightly as needed for Sleep       ALLERGIES     Tobacco; Decongestant; Motrin; and Cabbage    FAMILY HISTORY       Family History   Problem Relation Age of Onset   ??? Alcohol Abuse Father    ??? Alcohol Abuse Mother    ??? Mental Illness Mother    ??? Hypertension Mother    ??? Mental Illness Maternal Grandmother    ???  Hypertension Maternal Grandmother    ??? Alcohol Abuse Brother    ??? Mental Illness Daughter    ??? Cancer Paternal Grandfather    ??? Colon Cancer Neg Hx    ??? Colon Polyps Neg Hx    ??? Liver Cancer Neg Hx    ??? Stomach Cancer Neg Hx     ??? Ulcerative Colitis Neg Hx    ??? Crohn's Disease Neg Hx    ??? Liver Disease Neg Hx    ??? Rectal Cancer Neg Hx           SOCIAL HISTORY       History     Social History   ??? Marital Status: Married     Spouse Name: Johnnie     Number of Children: 2   ??? Years of Education: 15     Social History Main Topics   ??? Smoking status: Never Smoker    ??? Smokeless tobacco: Never Used   ??? Alcohol Use: No   ??? Drug Use: No   ??? Sexual Activity: None     Other Topics Concern   ??? None     Social History Narrative       SCREENINGS             PHYSICAL EXAM    (up to 7 for level 4, 8 or more for level 5)   ED Triage Vitals   BP Temp Temp src Pulse Resp SpO2 Height Weight   06/13/14 0625 06/13/14 0625 -- 06/13/14 2952 06/13/14 0625 06/13/14 0625 06/13/14 0625 06/13/14 0625   142/85 mmHg 98 ??F (36.7 ??C)  72 20 93 %  (1.651 m) 195 lb (88.451 kg)       Physical Exam   Constitutional: She is oriented to person, place, and time. She appears well-developed and well-nourished.   HENT:   Head: Normocephalic and atraumatic.   Mouth/Throat: Oropharynx is clear and moist.   Eyes: Conjunctivae are normal. Pupils are equal, round, and reactive to light. No scleral icterus.   Neck: Normal range of motion. Neck supple. No tracheal deviation present. No thyromegaly present.   Cardiovascular: Normal rate, regular rhythm, normal heart sounds and intact distal pulses.  Exam reveals no gallop and no friction rub.    No murmur heard.  Pulmonary/Chest: Effort normal and breath sounds normal. No respiratory distress. She has no wheezes.   Abdominal: Soft. Bowel sounds are normal. She exhibits no distension. There is no tenderness. There is no rebound and no guarding.   Musculoskeletal: She exhibits no edema or tenderness.   Lymphadenopathy:     She has no cervical adenopathy.   Neurological: She is alert and oriented to person, place, and time. She has normal reflexes. She displays normal reflexes. No cranial nerve deficit or sensory deficit. She exhibits  normal muscle tone. Coordination normal. GCS eye subscore is 4. GCS verbal subscore is 5. GCS motor subscore is 6.   She has no focal deficits are no meningeal signs on exam   Skin: No rash noted. No erythema. No pallor.   Nursing note and vitals reviewed.      DIAGNOSTIC RESULTS     EKG: All EKG's are interpreted by the Emergency Department Physician who either signs or Co-signs this chart in the absence of a cardiologist.        RADIOLOGY:   Non-plain film images such as CT, Ultrasound and MRI are read by the radiologist. Plain radiographic images are visualized and preliminarily interpreted  by the emergency physician with the below findings:        Interpretation per the Radiologist below, if available at the time of this note:    No orders to display         ED BEDSIDE ULTRASOUND:   Performed by ED Physician - none    LABS:  Labs Reviewed - No data to display    All other labs were within normal range or not returned as of this dictation.    RE-ASSESSMENT     06/13/14  6:57 AM  Sharon SellerBrian T Sheral Pfahler, MD  Patient is turned over to Dr. Egbert GaribaldiKlope at this time with the remainder of her evaluation pending      EMERGENCY DEPARTMENT COURSE and DIFFERENTIAL DIAGNOSIS/MDM:   Vitals:    Filed Vitals:    06/13/14 0625   BP: 142/85   Pulse: 72   Temp: 98 ??F (36.7 ??C)   Resp: 20   Height: 5\' 5"  (1.651 m)   Weight: 195 lb (88.451 kg)   SpO2: 93%       MDM  Number of Diagnoses or Management Options  Risk of Complications, Morbidity, and/or Mortality  Presenting problems: moderate  Diagnostic procedures: moderate  Management options: moderate  General comments: Patient is having a headache similar to prior migraine headaches she's had in the past she is concerned that her blood sugar will be abnormal in that she is dehydrated we'll check a poc glucose and a BMP. Based on her presentation I do not feel like we need to do neuro imaging at this point in time. She has no other red flag symptoms we will treat her symptomatically and  reassess.          CONSULTS:  None    PROCEDURES:  Unless otherwise noted below, none     Procedures    FINAL IMPRESSION    No diagnosis found.      DISPOSITION/PLAN   DISPOSITION     PATIENT REFERRED TO:  No follow-up provider specified.    DISCHARGE MEDICATIONS:  New Prescriptions    No medications on file          (Please note that portions of this note were completed with a voice recognition program.  Efforts were made to edit the dictations but occasionally words are mis-transcribed.)    Sharon SellerBrian T Meela Wareing, MD (electronically signed)  Attending Emergency Physician          Sharon SellerBrian T Vernetta Dizdarevic, MD  06/13/14 [phone]

## 2014-06-13 NOTE — ED Notes (Signed)
Report to cody    Martyn MalayKristy M Ayaana Biondo, RN  06/13/14 [phone]

## 2014-06-13 NOTE — ED Notes (Signed)
Patient placed in a gownPatient placed on cardiac monitor, continuous pulse oximeter, and NIBP monitor. Monitor alarms on.      Martyn MalayKristy M Roosvelt Churchwell, RN  06/13/14 [phone]

## 2014-06-13 NOTE — ED Provider Notes (Signed)
[facility]  eMERGENCY dEPARTMENT eNCOUnter      Pt Name: Joyce Cohen  MRN: [id number]  Birthdate 08/10/1955  Date of evaluation: 06/13/2014  Provider: JAARS AbbotJeremy Millisa Giarrusso, MD    CHIEF COMPLAINT       Chief Complaint   Patient presents with   ??? Headache     started yesterday   ??? Emesis     0700: ED care assumed from Dr. Juanetta GoslingHawkins - currently awaiting lab studies    PHYSICAL EXAM    (up to 7 for level 4, 8 or more for level 5)   ED Triage Vitals   BP Temp Temp src Pulse Resp SpO2 Height Weight   06/13/14 0625 06/13/14 [id number] -- 06/13/14 [id number] 06/13/14 0625 06/13/14 0625 06/13/14 0625 06/13/14 0625   142/85 mmHg 98 ??F (36.7 ??C)  72 20 93 % 5\' 5"  (1.651 m) 195 lb (88.451 kg)       Physical Exam    DIAGNOSTIC RESULTS         LABS:  Labs Reviewed   BASIC METABOLIC PANEL - Abnormal; Notable for the following:     Glucose 164 (*)     CREATININE 1.0 (*)     GFR Non-African American 57 (*)     All other components within normal limits   POCT GLUCOSE - Abnormal; Notable for the following:     POC Glucose 144 (*)     All other components within normal limits   POCT GLUCOSE - Normal       All other labs were within normal range or not returned as of this dictation.    EMERGENCY DEPARTMENT COURSE and DIFFERENTIAL DIAGNOSIS/MDM:   Vitals:    Filed Vitals:    06/13/14 0625 06/13/14 0725   BP: 142/85 127/78   Pulse: 72 70   Temp: 98 ??F (36.7 ??C)    Resp: 20 18   Height: 5\' 5"  (1.651 m)    Weight: 195 lb (88.451 kg)    SpO2: 93% 95%       MDM    Reassessment  Laboratory studies unremarkable. Patient's headache is much improved. She'll be discharged home to follow-up with her primary care doctor. Plan of care was discussed and patient was agreeable.      PROCEDURES:  Unless otherwise noted below, none     Procedures    FINAL IMPRESSION      1. Nonintractable headache, unspecified chronicity pattern, unspecified headache type          DISPOSITION/PLAN   DISPOSITION Decision To Discharge    PATIENT REFERRED TO:  Sharmon LeydenJohn W Brazzell,  MD  [address]  [place] [id number]  [phone]            DISCHARGE MEDICATIONS:  New Prescriptions    No medications on file          (Please note that portions of this note were completed with a voice recognition program.  Efforts were made to edit the dictations but occasionally words are mis-transcribed.)    [place] AbbotJeremy Alaena Strader, MD (electronically signed)  Attending Emergency Physician          [place] AbbotJeremy Claudio Mondry, MD  06/13/14 [phone]

## 2014-06-22 ENCOUNTER — Inpatient Hospital Stay
Admit: 2014-06-22 | Discharge: 2014-06-22 | Payer: BLUE CROSS/BLUE SHIELD | Attending: Pain Medicine | Primary: Emergency Medicine

## 2014-06-22 DIAGNOSIS — G588 Other specified mononeuropathies: Secondary | ICD-10-CM

## 2014-06-22 MED ORDER — TRIAMCINOLONE ACETONIDE 40 MG/ML IJ SUSP
40 MG/ML | INTRAMUSCULAR | Status: AC
Start: 2014-06-22 — End: ?

## 2014-06-22 MED ORDER — BUPIVACAINE HCL (PF) 0.5 % IJ SOLN
0.5 % | Freq: Once | INTRAMUSCULAR | Status: AC | PRN
Start: 2014-06-22 — End: 2014-06-22
  Administered 2014-06-22: 21:00:00 9

## 2014-06-22 MED ORDER — BUPIVACAINE HCL (PF) 0.5 % IJ SOLN
0.5 % | INTRAMUSCULAR | Status: AC
Start: 2014-06-22 — End: ?

## 2014-06-22 MED FILL — KENALOG 40 MG/ML IJ SUSP: 40 MG/ML | INTRAMUSCULAR | Qty: 1

## 2014-06-22 MED FILL — MARCAINE PRESERVATIVE FREE 0.5 % IJ SOLN: 0.5 % | INTRAMUSCULAR | Qty: 30

## 2014-06-22 NOTE — Progress Notes (Addendum)
Nursing Admission Record    Current Medication Regimen Working?  Yes    Current Issues / Falls / ER Visits:  Yes -went to ER for migraine 4/25    Percentage of Pain Relief after Last Procedure:  100 %    How long lasted:  3 months    Radiology exams received during the last 12 months: Yes       When mri here at [facility]                                              Where       Imaging on chart: Yes         Imaging records requested: No  Physical therapy during the last 12 months: No       When: na                                             Where na  Labs during the last 12 months: No    Education Provided:  [x]  Review of [place]  [x]  Agreement Review  []  Compliance Issues Discussed    []  Cognitive Behavior Needs [x]  Exercise []  Review of Test []  Financial Issues  []  Tobacco/Alcohol Use [x]  Teaching []  New Patient []  Picture Obtained    Physician Plan:  []  Outgoing Referral  []  Pharmacy Consult  []  Test Ordered   []  Obtained Test Results / Consult Notes    []  Suspected Physical Abuse or Suicide Risk assessed - IF YES COMPLETE QUESTIONS BELOW    If any of the following questions are answered yes - contact attending physician for referral:    Has been considering harming self to escape stress, pain problems?  []  YES  []  NO  Has a suicide plan? []  YES  []  NO  Has attempted suicide in the past?   []  YES  []  NO  Has a close friend or family member who committed suicide?  []  YES  []  NO    Patient Referred To :     Additional Notes:    Assessment Completed by:  Electronically signed by Newt Lukes, RN on 06/22/2014 at 4:24 PM Procedure:  Level of Consciousness: [x] Alert [x] Oriented [] Disoriented [] Lethargic  Anxiety Level: [x] Calm [] Anxious [] Depressed [] Other  Skin: [x] Warm [x] Dry [] Cool [] Moist [] Intact [] Other  Cardiovascular: [] Palpitations: [] Never [] Occasionally [] Frequently  Chest Pain: [] No [] Yes  Respiratory:  [x] Unlabored [] Labored [] Cough ([]  Productive [] Unproductive)  HCG Required: [x] No [] Yes   Results:  [] Negative [] Positive  Knowledge Level:        [x] Patient/Other verbalized understanding of pre-procedure instructions.        [x] Assessment of post-op care needs (transportation, responsible caregiver)        [x] Able to discuss health care problems and how to deal with it.  Factors that Affect Teaching:        Language Barrier: [x] No [] Yes - why:        Hearing Loss:        [x] No [] Yes            Corrective Device:  [] Yes [] No        Vision Loss:           [] No [x] Yes  Corrective Device:  [] Yes [] No        Memory Loss:       [x] No [] Yes            [] Short Term [] Long Term  Motivational Level:  [x] Asks Questions                  [] Extremely Anxious       [x] Seems Interested               [] Seems Uninterested                  [x] Denies need for Education  Risk for Injury:  [x] Patient oriented to person, place and time  [] History of frequent falls/loss of balance  Nutritional:  [] Change in appetite   [] Weight Gain   [] Weight Loss  Functional:  [] Requires assistance with ADL's

## 2014-06-22 NOTE — Discharge Instructions (Signed)
Dr. Claudie Reveringiley Love  Discharge Instructions / Information        You have had the procedure(s) called:    []  Cervical Epidural     []  Lumbar Epidural     [x]  __right ilioinguinal nerve block injection  []  Cervical Facets  []  Lumbar Facets []  Radiofrequency Lesioning  []  Occipital Nerve Blocks []  Caudal Epidural []  Transforaminal Epidural  []  Trigger Point Injections []  SI Joint Injection []  ______________________    Please follow these instructions carefully.      If you have questions or problems you may call [phone].     You have received the following medications:  []  Lidocaine []  Bupivicaine: [x]  0.50%   []  0.25% []  DepoMedrol   []  Normal Saline  []  Versed []  Sodium Bicarbonate   []  Isovue   []  Botox  [x]  Kenalog   []  Diprovan  []  _____________________    PATIENT INFORMATION:  You may experience the following symptoms after your procedure.  These symptoms are normal and should not cause alarm.     An increase in our pain may occurr.  This may last 24-48 hours after your procedure.   No change in pain.   Weakness or numbness in your affected extremity.  This will usually only last a few hours.    REMEMBER TO REPORT THE FOLLOWING SYMPTOMS TO YOUR DOCTOR:   Redness, swelling. Or drainage at the injection site.   Unusual pain that interferes with your usual activities of daily living.    OTHER INSTRUCTIONS:  [x]  I will apply ice to the injection site for 24 hours.  Apply 15-2- minutes and take off 40 minutes.  []  I understand if a steroid was used it will take effect in 3-5 days.  [x]  I have received my personal belongings and valuable.  [x]  I have received a copy of my discharge instructions and understand to my satisfaction.  [x]  I will not drive for [x]  12 hours    []  24 hours after my injections.  []  I understand that today I will  []  rest  []  walk and move freely  []  other _________________  []  Additional instructions:   ____________________________________________________________________________        Patient Discharge:  [x]  Home  []  Hospital  []  Other  ___________________________________    How:  [x]  Ambulatory  []  Wheelchair   []  Stretcher   []  __________________________________    Accompanied by:  [x]  Family Member  []  Friend  []  Hospital Staff  []  Ambulance Staff    []  Other_____________________________

## 2014-06-22 NOTE — Procedures (Signed)
DATE:06/22/2014        REASON FOR VISIT:Principal Problem:    Ilioinguinal neuralgia of right side      PROCEDURE: Nerve Injection   []  Moderate Sedation   []  Fluoroscopy Used                                                        [x]   Ultrasound Used    DESCRIPTION OF PROCEDURE:    After obtaining informed consent, the patient was positioned []  Supine []  Prone   []  Laterally, and sterilely prepped.  The []  Left [x]  Right []  Medial []  Lateral   ilioinguinal nerves were injected with 9 ml of 0.5% Marcaine and 40 mg of Kenalog. Dividing the oblique muscles and obtaining paresthesias.      There were no complications. Good effect.    DIAGNOSES:  []  Intercostal Neuralgia  [x]  Ilioinguinal Neuralgia []  Other   []  Geniculate Neuralgia  []  Genitofemoral Neuralgia    PLAN:  [x]  Will return to office in3 month(s)   for [x]  Planned Procedure []  Office Visit.  [x]  Prescriptions were given today    []  No prescriptions needed today  [x]  Patient is to call with any questions or concerns which may arise prior to the next office visit.     COMMENTS:  The patient returns today complaining of groin pain. Pain level is a 5.    Current Medication Regimen Working? Yes  Current Issues / Falls / ER Visits: Yes -went to ER for migraine 4/25  Percentage of Pain Relief after Last Procedure: 100 % How long lasted: 3 months  Radiology exams received during the last 12 months: Yes   When mri here at [facility]   Imaging on chart: Yes    Imaging records requested: No  Physical therapy during the last 12 months: No   When: na Where na  Labs during the last 12 months: No    Today we are continuing with right ilioinguinal nerve block injections with ultrasound. Discussed the risks and complications with the patient.     Beatrix Fetters, MD      [x]  Over 50% of today's appointment was given to discussion, evaluation and counseling.

## 2014-07-20 MED ORDER — TRAMADOL HCL 50 MG PO TABS
50 MG | ORAL_TABLET | Freq: Four times a day (QID) | ORAL | Status: DC | PRN
Start: 2014-07-20 — End: 2014-12-26

## 2014-07-27 ENCOUNTER — Inpatient Hospital Stay
Admit: 2014-07-27 | Discharge: 2014-07-27 | Payer: BLUE CROSS/BLUE SHIELD | Attending: Family | Primary: Emergency Medicine

## 2014-07-27 DIAGNOSIS — M5416 Radiculopathy, lumbar region: Secondary | ICD-10-CM

## 2014-07-27 NOTE — Progress Notes (Signed)
Nursing Admission Record    Current Medication Regimen Working?  Yes    Current Issues / Falls / ER Visits:  No    Percentage of Pain Relief after Last Procedure:  0 %    How long lasted:  0 days    Radiology exams received during the last 12 months: No       When na                                              [place]       Imaging on chart: No         Imaging records requested: No  Physical therapy during the last 12 months: no       When: na                                             Where na  Labs during the last 12 months: Yes    Education Provided:  [x]  Review of [place]  []  Agreement Review  []  Compliance Issues Discussed    []  Cognitive Behavior Needs [x]  Exercise []  Review of Test []  Financial Issues  []  Tobacco/Alcohol Use [x]  Teaching []  New Patient []  Picture Obtained    Physician Plan:  []  Outgoing Referral  []  Pharmacy Consult  []  Test Ordered   []  Obtained Test Results / Consult Notes  []  UDS due at next visit, verified per EPIC      []  Suspected Physical Abuse or Suicide Risk assessed - IF YES COMPLETE QUESTIONS BELOW    If any of the following questions are answered yes - contact attending physician for referral:    Has been considering harming self to escape stress, pain problems?  []  YES  [x]  NO  Has a suicide plan? []  YES  [x]  NO  Has attempted suicide in the past?   []  YES  [x]  NO  Has a close friend or family member who committed suicide?  []  YES  [x]  NO    Patient Referred To :     Additional Notes:    Assessment Completed by:  Electronically signed by Lidia Collum, RN on 07/27/2014 at 3:00 PM

## 2014-07-27 NOTE — Progress Notes (Signed)
[name]  Patient Pain Assessment      Primary Care Physician: Sharmon Leyden, MD    Chief complaint:   Chief Complaint   Patient presents with   ??? Hip Pain   .    HISTORY OF PRESENT ILLNESS:    Joyce Cohen is 59 y.o. female with:  Patient returns today. Reports injection she received last month did not help her pain and it is worse. Reports pain in upper right hip and radiates anterior into right groin. Pain level of approximately 5, took Tramadol prior to visit. She does not want to increase the Tramadol because she has a fear of medication addiction. Walking for exercise. Reports injection already scheduled for August 2016. Option of Pennsaid cream discussed and patient would like to try it, samples given and she is to call for a prescription if she wants more. Diet, exercise, and weight loss discussed as beneficial to pain control. Husband with patient and she has good mood, talkative.         Review of Systems:  Other than stated above in the HPI is negative      Zadie Rhine compliant? yes  Concern for prescription abuse?no    Current Pain Assessment  Pain Assessment  Pain Assessment: 0-10  Pain Level: 5  Pain Type: Chronic pain  Pain Location: Groin, Hip  Pain Orientation: Right  Pain Descriptors: Constant, Sharp  Pain Frequency: Continuous  Pain Onset: On-going  Clinical Progression: Not changed  Patient's Stated Pain Goal: No pain  Pain Intervention(s): Medication (see eMar)  Response to Pain Intervention: Patient Satisfied                Past Medical History      Diagnosis Date   ??? Pleurisy without mention of effusion or current tuberculosis    ??? Unspecified asthma(493.90)    ??? Unspecified hemorrhoids without mention of complication    ??? Abdominal pain, right lower quadrant    ??? Unspecified constipation    ??? Arthritis    ??? Bipolar disorder (HCC)    ??? MVP (mitral valve prolapse)    ??? Cancer (HCC)      skin cancer   ??? 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 Stomach ulcer       as a child   ??? Blood circulation, collateral    ??? Neuromuscular disorder (HCC) Inguinal Nerve pain   ??? Diabetes mellitus (HCC)    ??? Hyperlipidemia    ??? Right groin pain 02/28/2014       Surgical History  Past Surgical History   Procedure Laterality Date   ??? Rotator cuff repair Right    ??? Tonsillectomy     ??? Hysterectomy     ??? Appendectomy     ??? Wisdom tooth extraction     ??? Abdominal exploration surgery       times two due to adhesions   ??? Colonoscopy  05/19/11     Dr Renato Gails   ??? Colonoscopy  07/15/2012     Dr. Zollie Beckers   ??? Skin biopsy  L and R wrists   ??? Abdomen surgery     ??? Colon surgery  04/2013     1 polyp removed       Medications  Current Outpatient Prescriptions   Medication Sig Dispense Refill   ??? vitamin D (ERGOCALCIFEROL) 50000 UNITS CAPS capsule      ??? ranitidine (ZANTAC) 150 MG tablet      ???  traMADol (ULTRAM) 50 MG tablet Take 1 tablet by mouth every 6 hours as needed for Pain 45 tablet 2   ??? sodium chloride (OCEAN, BABY AYR) 0.65 % nasal spray 1 spray by Nasal route as needed for Congestion 1 Bottle 0   ??? QUEtiapine (SEROQUEL) 200 MG tablet Take 1 tablet by mouth nightly 30 tablet 0   ??? traZODone (DESYREL) 100 MG tablet Take 1 tablet by mouth nightly as needed for Sleep 30 tablet 0   ??? buPROPion SR (WELLBUTRIN SR) 150 MG SR tablet Take 1 tablet by mouth daily 60 tablet 3   ??? OXcarbazepine (TRILEPTAL) 150 MG tablet Take 1 tablet by mouth 2 times daily 60 tablet 0   ??? lubiprostone (AMITIZA) 24 MCG capsule Take 1 capsule by mouth 2 times daily (with meals) 60 capsule 11   ??? glipiZIDE (GLUCOTROL) 10 MG tablet Take 1 tablet by mouth every morning (before breakfast) 60 tablet 3   ??? Multiple Vitamins-Minerals (THERAPEUTIC MULTIVITAMIN-MINERALS) tablet Take 1 tablet by mouth daily       No current facility-administered medications for this encounter.       Allergies  Tobacco; Decongestant; Motrin; and Cabbage    Family History  family history includes Alcohol Abuse in her brother, father, and mother; Cancer in  her paternal grandfather; Hypertension in her maternal grandmother and mother; Mental Illness in her daughter, maternal grandmother, and mother. There is no history of Colon Cancer, Colon Polyps, Liver Cancer, Stomach Cancer, Ulcerative Colitis, Crohn's Disease, Liver Disease, or Rectal Cancer.    Social History  History     Social History   ??? Marital Status: Married     Spouse Name: Johnnie     Number of Children: 2   ??? Years of Education: 15     Social History Main Topics   ??? Smoking status: Never Smoker    ??? Smokeless tobacco: Never Used   ??? Alcohol Use: No   ??? Drug Use: No   ??? Sexual Activity: None     Other Topics Concern   ??? None     Social History Narrative      reports that she does not use illicit drugs.        REVIEW OF SYSTEMS:  ROS         GENERAL PHYSICAL EXAM:  Vitals: BP 118/78 mmHg   Pulse 95   Temp(Src) 98.2 ??F (36.8 ??C)   Resp 18   Ht  (1.651 m)   Wt 195 lb (88.451 kg)   BMI 32.45 kg/m2   SpO2 96%, Body mass index is 32.45 kg/(m^2).  Physical Exam Ortho Exam    DATA  Labs:  BENZODIAZEPINE SCREEN, URINE   Date Value Ref Range Status   05/12/2014 Negative Negative <100 ng/mL Final        Imaging:  Radiology Images and Reports reviewed where indicated and necessary    ASSESSMENT  Joyce Cohen is a 59 y.o. female with:    Right lumbar radiculopathy  Illioinguinal Neuralgia  Right Hip Pain     PLAN  Patient is to call with any questions or concerns which may arise prior to the next office visit in August 2016.  Samples of Pennsaid given, call for prescription if she wants more.       Electronically signed by Donnetta Hail, APRN on 07/27/2014 at 3:08 PM

## 2014-09-22 ENCOUNTER — Inpatient Hospital Stay: Admit: 2014-09-22 | Payer: BLUE CROSS/BLUE SHIELD | Attending: Pain Medicine | Primary: Emergency Medicine

## 2014-09-22 DIAGNOSIS — G588 Other specified mononeuropathies: Secondary | ICD-10-CM

## 2014-09-22 MED ORDER — BUPIVACAINE HCL (PF) 0.5 % IJ SOLN
0.5 % | Freq: Once | INTRAMUSCULAR | Status: AC | PRN
Start: 2014-09-22 — End: 2014-09-22
  Administered 2014-09-22: 18:00:00 9 via INTRADERMAL

## 2014-09-22 MED ORDER — TRIAMCINOLONE ACETONIDE 40 MG/ML IJ SUSP
40 MG/ML | Freq: Once | INTRAMUSCULAR | Status: AC | PRN
Start: 2014-09-22 — End: 2014-09-22
  Administered 2014-09-22: 18:00:00 40 via INTRAMUSCULAR

## 2014-09-22 MED ORDER — BUPIVACAINE HCL (PF) 0.5 % IJ SOLN
0.5 % | INTRAMUSCULAR | Status: AC
Start: 2014-09-22 — End: ?

## 2014-09-22 MED ORDER — TRIAMCINOLONE ACETONIDE 40 MG/ML IJ SUSP
40 MG/ML | INTRAMUSCULAR | Status: AC
Start: 2014-09-22 — End: ?

## 2014-09-22 MED FILL — MARCAINE PRESERVATIVE FREE 0.5 % IJ SOLN: 0.5 % | INTRAMUSCULAR | Qty: 30

## 2014-09-22 MED FILL — KENALOG 40 MG/ML IJ SUSP: 40 MG/ML | INTRAMUSCULAR | Qty: 1

## 2014-09-22 NOTE — Progress Notes (Signed)
Procedure:  Level of Consciousness: [x] Alert [x] Oriented [] Disoriented [] Lethargic  Anxiety Level: [x] Calm [] Anxious [] Depressed [] Other  Skin: [x] Warm [x] Dry [] Cool [] Moist [] Intact [] Other  Cardiovascular: [] Palpitations: [] Never [] Occasionally [] Frequently  Chest Pain: [x] No [] Yes  Respiratory:  [x] Unlabored [] Labored [] Cough ([]  Productive [] Unproductive)  HCG Required: [x] No [] Yes   Results: [] Negative [] Positive  Knowledge Level:        [x] Patient/Other verbalized understanding of pre-procedure instructions.        [x] Assessment of post-op care needs (transportation, responsible caregiver)        [x] Able to discuss health care problems and how to deal with it.  Factors that Affect Teaching:        Language Barrier: [x] No [] Yes - why:        Hearing Loss:        [] No [x] Yes            Corrective Device:  [x] Yes [] No        Vision Loss:           [] No [x] Yes            Corrective Device:  [x] Yes [] No        Memory Loss:       [x] No [] Yes            [] Short Term [] Long Term  Motivational Level:  [x] Asks Questions                  [] Extremely Anxious       [x] Seems Interested               [] Seems Uninterested                  [x] Denies need for Education  Risk for Injury:  [x] Patient oriented to person, place and time  [] History of frequent falls/loss of balance  Nutritional:  [] Change in appetite   [] Weight Gain   [] Weight Loss  Functional:  [] Requires assistance with ADL's

## 2014-09-22 NOTE — Discharge Instructions (Signed)
Dr. Claudie Revering  Discharge Instructions / Information        You have had the procedure(s) called:    []  Cervical Epidural     []  Lumbar Epidural     []  _____________Injection  []  Cervical Facets  []  Lumbar Facets []  Radiofrequency Lesioning  []  Occipital Nerve Blocks []  Caudal Epidural []  Transforaminal Epidural  []  Trigger Point Injections []  SI Joint Injection [x]  Right ilioinguinal nerve block with ultrasound    Please follow these instructions carefully.      If you have questions or problems you may call [phone].     You have received the following medications:  []  Lidocaine [x]  Bupivicaine: [x]  0.50%   []  0.25% []  DepoMedrol   []  Normal Saline  []  Versed []  Sodium Bicarbonate   []  Isovue   []  Botox  [x]  Kenalog   []  Diprovan  []  _____________________    PATIENT INFORMATION:  You may experience the following symptoms after your procedure.  These symptoms are normal and should not cause alarm.     An increase in our pain may occurr.  This may last 24-48 hours after your procedure.   No change in pain.   Weakness or numbness in your affected extremity.  This will usually only last a few hours.    REMEMBER TO REPORT THE FOLLOWING SYMPTOMS TO YOUR DOCTOR:   Redness, swelling. Or drainage at the injection site.   Unusual pain that interferes with your usual activities of daily living.    OTHER INSTRUCTIONS:  [x]  I will apply ice to the injection site for 24 hours.  Apply 15-2- minutes and take off 40 minutes.  [x]  I understand if a steroid was used it will take effect in 3-5 days.  [x]  I have received my personal belongings and valuable.  [x]  I have received a copy of my discharge instructions and understand to my satisfaction.  [x]  I will not drive for [x]  12 hours    []  24 hours after my injections.  [x]  I understand that today I will  [x]  rest  []  walk and move freely  []  other _________________  []  Additional instructions:   ____________________________________________________________________________        Patient Discharge:  [x]  Home  []  Hospital  []  Other  ___________________________________    How:  [x]  Ambulatory  []  Wheelchair   []  Stretcher   []  __________________________________    Accompanied by:  [x]  Family Member  []  [facility] Staff  []  Ambulance Staff    []  Other_____________________________

## 2014-09-22 NOTE — Progress Notes (Signed)
Nursing Admission Record    Current Medication Regimen Working?  Yes    Current Issues / Falls / ER Visits:  No    Percentage of Pain Relief after Last Procedure:  20 %    How long lasted:  1 months    Radiology exams received during the last 12 months: No       When  NA                                              Where NA       Imaging on chart: No         Imaging records requested: No  Physical therapy during the last 12 months: No       When: NA                                             Where NA  Labs during the last 12 months: Yes    Education Provided:  [x]  Review of [place]  []  Agreement Review  []  Compliance Issues Discussed    []  Cognitive Behavior Needs [x]  Exercise []  Review of Test []  Financial Issues  []  Tobacco/Alcohol Use [x]  Teaching []  New Patient []  Picture Obtained    Physician Plan:  []  Outgoing Referral  []  Pharmacy Consult  []  Test Ordered   []  Obtained Test Results / Consult Notes  []  UDS due at next visit, verified per EPIC      []  Suspected Physical Abuse or Suicide Risk assessed - IF YES COMPLETE QUESTIONS BELOW    If any of the following questions are answered yes - contact attending physician for referral:    Has been considering harming self to escape stress, pain problems?  []  YES  [x]  NO  Has a suicide plan? []  YES  [x]  NO  Has attempted suicide in the past?   []  YES  [x]  NO  Has a close friend or family member who committed suicide?  []  YES  [x]  NO    Patient Referred To :     Additional Notes:    Assessment Completed by:  Electronically signed by Laneta Simmers, RN on 09/22/2014 at 2:21 PM

## 2014-09-22 NOTE — Procedures (Signed)
DATE:09/22/2014        REASON FOR VISIT:Principal Problem:    Right inguinal pain      PROCEDURE: Nerve Injection   []  Moderate Sedation   []  Fluoroscopy Used                                                        [x]   Ultrasound Used    DESCRIPTION OF PROCEDURE:    After obtaining informed consent, the patient was positioned [x]  Supine []  Prone   []  Laterally, and sterilely prepped.  The []  Left [x]  Right []  Medial []  Lateral   ilioinguinal nerve(s), []  levels were injected with 9 ml of 0.5% Marcaine and 40mg  of Kenalog.  []       There were no complications. She felt we had injected the pain generator very well.    DIAGNOSES:  []  Intercostal Neuralgia  [x]  Ilioinguinal Neuralgia []  Other   []  Geniculate Neuralgia  []  Genitofemoral Neuralgia    PLAN:  [x]  Will return to office in   for []  Planned Procedure [x]  Office Visit.  [x]  Prescriptions were given today    []  No prescriptions needed today  [x]  Patient is to call with any questions or concerns which may arise prior to the next office visit.     COMMENTS:    Pt c/o right groin pain that radiates around to the spine.  States she has no numbness at this time.  Pain score is a 9/10 today. These injections are not helping like they use to.   Will continue with the ilioinguinal nerve block with ultrasound.  Discussed risks and complications.    She doesn't feel a hernia, we reviewed the result of her last imaging. She did not do well after the last injection. If she doesn't respond well today we will pursue further testing and a surgical consult.    Current Medication Regimen Working? Yes    Current Issues / Falls / ER Visits: No    Percentage of Pain Relief after Last Procedure: 20 % How long lasted: 1 months    Radiology exams received during the last 12 months: No  When NA Where NA  Imaging on chart: No   Imaging records requested: No  Physical therapy during the last 12 months: No  When: NA Where NA  Labs during the last 12 months: Yes    Beatrix Fetters,  MD            []  Over 50% of today's appointment was given to discussion, evaluation and counseling.

## 2014-09-28 NOTE — Telephone Encounter (Signed)
Returned call.  Patient had right groin injection on 8/4, reports the injection is not helping.  I will call patient back after Dr. Sandria Manly is consulted. n Patient verbalized understanding.

## 2014-09-28 NOTE — Telephone Encounter (Signed)
Dr. Sandria Manly plans to schedule a Thoracic Epidural.  Attempted to call patient back to schedule without success. "The person you are trying to reach is not available."

## 2014-09-28 NOTE — Telephone Encounter (Signed)
Called patient, spoke with husband to schedule appointment for Thoracic Epidural. (See appts.)

## 2014-11-01 ENCOUNTER — Inpatient Hospital Stay
Admit: 2014-11-01 | Discharge: 2014-11-01 | Payer: BLUE CROSS/BLUE SHIELD | Attending: Pain Medicine | Primary: Emergency Medicine

## 2014-11-01 DIAGNOSIS — M5416 Radiculopathy, lumbar region: Secondary | ICD-10-CM

## 2014-11-01 MED ORDER — METHYLPREDNISOLONE ACETATE 80 MG/ML IJ SUSP
80 MG/ML | Freq: Once | INTRAMUSCULAR | Status: AC | PRN
Start: 2014-11-01 — End: 2014-11-01
  Administered 2014-11-01: 21:00:00 80 via EPIDURAL

## 2014-11-01 MED ORDER — SODIUM CHLORIDE 0.9 % IJ SOLN
0.9 % | INTRAMUSCULAR | Status: AC
Start: 2014-11-01 — End: ?

## 2014-11-01 MED ORDER — IOHEXOL 240 MG/ML IJ SOLN
240 MG/ML | Freq: Once | INTRAMUSCULAR | Status: AC | PRN
Start: 2014-11-01 — End: 2014-11-01
  Administered 2014-11-01: 21:00:00 2 via EPIDURAL

## 2014-11-01 MED ORDER — LIDOCAINE HCL (PF) 1 % IJ SOLN
1 % | Freq: Once | INTRAMUSCULAR | Status: AC | PRN
Start: 2014-11-01 — End: 2014-11-01
  Administered 2014-11-01: 21:00:00 3 via SUBCUTANEOUS

## 2014-11-01 MED ORDER — BUPIVACAINE HCL (PF) 0.5 % IJ SOLN
0.5 % | Freq: Once | INTRAMUSCULAR | Status: AC | PRN
Start: 2014-11-01 — End: 2014-11-01
  Administered 2014-11-01: 21:00:00 5 via EPIDURAL

## 2014-11-01 MED ORDER — LIDOCAINE-EPINEPHRINE 1 %-1:200000 IJ SOLN
1 percent-:200000 | Freq: Once | INTRAMUSCULAR | Status: AC | PRN
Start: 2014-11-01 — End: 2014-11-01
  Administered 2014-11-01: 21:00:00 2 via EPIDURAL

## 2014-11-01 MED ORDER — METHYLPREDNISOLONE ACETATE 80 MG/ML IJ SUSP
80 MG/ML | INTRAMUSCULAR | Status: AC
Start: 2014-11-01 — End: ?

## 2014-11-01 MED ORDER — BUPIVACAINE HCL (PF) 0.25 % IJ SOLN
0.25 % | INTRAMUSCULAR | Status: AC
Start: 2014-11-01 — End: ?

## 2014-11-01 MED ORDER — SODIUM CHLORIDE 0.9 % IJ SOLN
0.9 % | Freq: Once | INTRAMUSCULAR | Status: AC | PRN
Start: 2014-11-01 — End: 2014-11-01
  Administered 2014-11-01: 21:00:00 4

## 2014-11-01 MED FILL — SENSORCAINE-MPF 0.25 % IJ SOLN: 0.25 % | INTRAMUSCULAR | Qty: 10

## 2014-11-01 MED FILL — SODIUM CHLORIDE 0.9 % IJ SOLN: 0.9 % | INTRAMUSCULAR | Qty: 10

## 2014-11-01 MED FILL — DEPO-MEDROL 80 MG/ML IJ SUSP: 80 MG/ML | INTRAMUSCULAR | Qty: 1

## 2014-11-01 NOTE — Progress Notes (Signed)
Procedure:  Level of Consciousness: [x]Alert [x]Oriented []Disoriented []Lethargic  Anxiety Level: [x]Calm []Anxious []Depressed []Other  Skin: [x]Warm [x]Dry []Cool []Moist []Intact []Other  Cardiovascular: [x]Palpitations: [x]Never []Occasionally []Frequently  Chest Pain: [x]No []Yes  Respiratory:  [x]Unlabored []Labored []Cough ([] Productive []Unproductive)  HCG Required: [x]No []Yes   Results: []Negative []Positive  Knowledge Level:        [x]Patient/Other verbalized understanding of pre-procedure instructions.        [x]Assessment of post-op care needs (transportation, responsible caregiver)        [x]Able to discuss health care problems and how to deal with it.  Factors that Affect Teaching:        Language Barrier: [x]No []Yes - why:        Hearing Loss:        [x]No []Yes            Corrective Device:  []Yes [x]No        Vision Loss:           []No [x]Yes            Corrective Device:  [x]Yes []No        Memory Loss:       [x]No []Yes            []Short Term []Long Term  Motivational Level:  [x]Asks Questions                  []Extremely Anxious       [x]Seems Interested               []Seems Uninterested                  []Denies need for Education  Risk for Injury:  [x]Patient oriented to person, place and time  []History of frequent falls/loss of balance  Nutritional:  []Change in appetite   []Weight Gain   []Weight Loss  Functional:  []Requires assistance with ADL's

## 2014-11-01 NOTE — Procedures (Signed)
DATE: 11/01/2014      REASON FOR VISIT: Principal Problem:    Lumbar disc disease with radiculopathy        PROCEDURE: Thoracic Epidural Injection.    []  Moderate Sedation    DESCRIPTION OF PROCEDURE:    After obtaining informed consent, the patient was taken to the procedure room, positioned prone, and sterilely prepped.  The procedure was performed un fluoroscopic guidance.  First, 5ml of 1% Xylocaine was used at - for local anesthesia.  A 19-gauge Hustead needle was advanced to the epidural space.  This was confirmed on the fluoroscope with an injection of [x]  2ml []  - Contrast.  Then, [x]  5ml []  -ml of 0.25%Marcaine, [x]  4ml []  -ml of Normal Saline, and [x]  80 mg []  -mg of Depo Medrol was gently injected.      There were no complications.  She complains of LBP radiating around to the front of the hip  DIAGNOSES:  []  Pancreatitis  []  *Thoracic Degenerative Disc Disease  []  Postherpetic Neuralgia  [x]  *Thoracic Radiculopathy  [x]  Other Lumbar radiculopathy  PLAN:  []  Will return to office in 1 month(s)  for []  Planned Procedure []  Office Visit  [x]  Prescriptions were given today   []  No prescriptions needed today  [x]  Patient is to call with any questions or concerns which may arise prior to the next office visit.    COMMENTS:  Pt. Reports back pain that radiates around and into the right groin. Pain score is 9/10. Plan to proceed with scheduled Thoracic epidural. Risk and complications discussed with the Pt.    Current Medication Regimen Working? Yes    Current Issues / Falls / ER Visits: No    Percentage of Pain Relief after Last Procedure: 90 % How long lasted: 3 days    Radiology exams received during the last 12 months: No    Physical therapy during the last 12 months: No    Labs during the last 12 months: Yes    Beatrix Fetters, MD              []  Over 50% of today's appointment was given to discussion, evaluation and counseling.

## 2014-11-01 NOTE — Discharge Instructions (Signed)
Dr. Claudie Revering  Discharge Instructions / Information        You have had the procedure(s) called:    []  Cervical Epidural     [x]  Lumbar Epidural     []  _____________Injection  []  Cervical Facets  []  Lumbar Facets []  Radiofrequency Lesioning  []  Occipital Nerve Blocks []  Caudal Epidural []  Transforaminal Epidural  []  Trigger Point Injections []  SI Joint Injection []  ______________________    Please follow these instructions carefully.      If you have questions or problems you may call [phone].     You have received the following medications:  [x]  Lidocaine [x]  Bupivicaine: []  0.50%   [x]  0.25% [x]  DepoMedrol   [x]  Normal Saline  []  Versed []  Sodium Bicarbonate   []  Isovue   []  Botox  []  Kenalog   []  Diprovan  [x]  ___omnipaque__________________    PATIENT INFORMATION:  You may experience the following symptoms after your procedure.  These symptoms are normal and should not cause alarm.     An increase in our pain may occurr.  This may last 24-48 hours after your procedure.   No change in pain.   Weakness or numbness in your affected extremity.  This will usually only last a few hours.    REMEMBER TO REPORT THE FOLLOWING SYMPTOMS TO YOUR DOCTOR:   Redness, swelling. Or drainage at the injection site.   Unusual pain that interferes with your usual activities of daily living.    OTHER INSTRUCTIONS:  [x]  I will apply ice to the injection site for 24 hours.  Apply 15-2- minutes and take off 40 minutes.  [x]  I understand if a steroid was used it will take effect in 3-5 days.  [x]  I have received my personal belongings and valuable.  [x]  I have received a copy of my discharge instructions and understand to my satisfaction.  [x]  I will not drive for [x]  12 hours    []  24 hours after my injections.  []  I understand that today I will  [x]  rest  []  walk and move freely  []  other _________________  []  Additional instructions:  ____________________________________________________________________________        Patient  Discharge:  [x]  Home  []  Hospital  []  Other  ___________________________________    How:  []  Ambulatory  [x]  Wheelchair   []  Stretcher   []  __________________________________    Accompanied by:  [x]  Family Member  []  [facility] Staff  []  Ambulance Staff    []  Other_____________________________

## 2014-11-01 NOTE — Progress Notes (Signed)
Nursing Admission Record    Current Medication Regimen Working?  Yes    Current Issues / Falls / ER Visits:  No    Percentage of Pain Relief after Last Procedure:  90 %    How long lasted:  3 days    Radiology exams received during the last 12 months: No       Physical therapy during the last 12 months: No       Labs during the last 12 months: Yes    Education Provided:  [x]  Review of [place]  []  Agreement Review  []  Compliance Issues Discussed    []  Cognitive Behavior Needs []  Exercise []  Review of Test []  Financial Issues  []  Tobacco/Alcohol Use [x]  Teaching []  New Patient []  Picture Obtained    Physician Plan:  []  Outgoing Referral  []  Pharmacy Consult  []  Test Ordered   []  Obtained Test Results / Consult Notes  []  UDS due at next visit, verified per EPIC      []  Suspected Physical Abuse or Suicide Risk assessed - IF YES COMPLETE QUESTIONS BELOW    If any of the following questions are answered yes - contact attending physician for referral:    Has been considering harming self to escape stress, pain problems?  []  YES  []  NO  Has a suicide plan? []  YES  []  NO  Has attempted suicide in the past?   []  YES  []  NO  Has a close friend or family member who committed suicide?  []  YES  []  NO    Patient Referred To :     Additional Notes:    Assessment Completed by:  Electronically signed by Leeann Must, RN on 11/01/2014 at 4:10 PM

## 2014-11-06 DIAGNOSIS — R454 Irritability and anger: Principal | ICD-10-CM

## 2014-11-06 NOTE — ED Notes (Signed)
Bed: 13  Expected date:   Expected time:   Means of arrival:   Comments:  Hold     Marlene Lard, RN  11/06/14 2341

## 2014-11-06 NOTE — ED Triage Notes (Signed)
Patient is shaking, and smacking herself in the face, stating that her mind is going

## 2014-11-07 ENCOUNTER — Inpatient Hospital Stay
Admit: 2014-11-07 | Discharge: 2014-11-07 | Disposition: A | Discharge status: Home / Self Care | Payer: BLUE CROSS/BLUE SHIELD | Attending: Emergency Medicine

## 2014-11-07 LAB — MICROSCOPIC URINALYSIS
Bacteria, UA: NEGATIVE /HPF
Epithelial Cells, UA: 3 /HPF (ref 0–5)
Hyaline Casts, UA: 2 /HPF (ref 0–8)
RBC, UA: 1 /HPF (ref 0–4)
WBC, UA: 22 /HPF — ABNORMAL HIGH (ref 0–5)

## 2014-11-07 LAB — CBC WITH AUTO DIFFERENTIAL
Basophils %: 0.4 % (ref 0.0–1.0)
Basophils Absolute: 0.1 10*3/uL (ref 0.00–0.20)
Eosinophils %: 1.9 % (ref 0.0–5.0)
Eosinophils Absolute: 0.3 10*3/uL (ref 0.00–0.60)
Hematocrit: 43.9 % (ref 37.0–47.0)
Hemoglobin: 14.4 g/dL (ref 12.0–16.0)
Lymphocytes %: 26.7 % (ref 20.0–40.0)
Lymphocytes Absolute: 3.8 10*3/uL (ref 1.1–4.5)
MCH: 28.5 pg (ref 27.0–31.0)
MCHC: 32.8 g/dL — ABNORMAL LOW (ref 33.0–37.0)
MCV: 86.8 fL (ref 81.0–99.0)
MPV: 10.9 fL — ABNORMAL HIGH (ref 7.4–10.4)
Monocytes %: 7.3 % (ref 0.0–10.0)
Monocytes Absolute: 1 10*3/uL — ABNORMAL HIGH (ref 0.00–0.90)
Neutrophils %: 63.7 % (ref 50.0–65.0)
Neutrophils Absolute: 9 10*3/uL — ABNORMAL HIGH (ref 1.5–7.5)
Platelets: 231 10*3/uL (ref 130–400)
RBC: 5.06 M/uL (ref 4.20–5.40)
RDW: 13.1 % (ref 11.5–14.5)
WBC: 14.1 10*3/uL — ABNORMAL HIGH (ref 4.8–10.8)

## 2014-11-07 LAB — URINALYSIS
Bilirubin Urine: NEGATIVE
Blood, Urine: NEGATIVE
Glucose, Ur: NEGATIVE mg/dL
Ketones, Urine: NEGATIVE mg/dL
Nitrite, Urine: NEGATIVE
Protein, UA: NEGATIVE mg/dL
Specific Gravity, UA: 1.007 (ref 1.005–1.030)
Urobilinogen, Urine: 0.2 E.U./dL (ref <2.0)
pH, UA: 6 (ref 5.0–8.0)

## 2014-11-07 LAB — COMPREHENSIVE METABOLIC PANEL
ALT: 19 U/L (ref 5–33)
AST: 10 U/L (ref 5–32)
Albumin: 4.3 g/dL (ref 3.5–5.2)
Alkaline Phosphatase: 78 U/L (ref 35–104)
Anion Gap: 16 mmol/L (ref 7–19)
BUN: 21 mg/dL — ABNORMAL HIGH (ref 6–20)
CO2: 18 mmol/L — ABNORMAL LOW (ref 22–29)
Calcium: 9.5 mg/dL (ref 8.6–10.0)
Chloride: 106 mmol/L (ref 98–111)
Creatinine: 1 mg/dL — ABNORMAL HIGH (ref 0.5–0.9)
GFR Non-African American: 57 — AB (ref >60)
Globulin: 2.1 g/dL
Glucose: 189 mg/dL — ABNORMAL HIGH (ref 74–109)
Potassium: 4.4 mmol/L (ref 3.5–5.0)
Sodium: 140 mmol/L (ref 136–145)
Total Bilirubin: <0.2 mg/dL — AB (ref 0.2–1.2)
Total Protein: 6.4 g/dL — ABNORMAL LOW (ref 6.6–8.7)

## 2014-11-07 LAB — ETHANOL: Ethanol Lvl: <10 mg/dL

## 2014-11-07 MED ORDER — CEPHALEXIN 500 MG PO CAPS
500 MG | ORAL_CAPSULE | Freq: Two times a day (BID) | ORAL | 0 refills | Status: AC
Start: 2014-11-07 — End: 2014-11-12

## 2014-11-07 NOTE — ED Provider Notes (Signed)
[facility] EMERGENCY DEPT  eMERGENCY dEPARTMENT eNCOUnter      Pt Name: Joyce Cohen  MRN: [id number]  Birthdate 05/25/1955  Date of evaluation: 11/06/2014  Provider: Salome Holmes, MD    CHIEF COMPLAINT       Chief Complaint   Patient presents with   ??? Mental Health Problem     shaking, and patient states that her "mind is going" and she got angry         HISTORY OF PRESENT ILLNESS   (Location/Symptom, Timing/Onset, Context/Setting, Quality, Duration, Modifying Factors, Severity)  Note limiting factors.   Joyce Cohen is a 59 y.o. female who presents to the emergency department complaining of problems with uncontrolled anger. Patient states she has a history of bipolar. States that she has not been under more stress than usual but tonight became upset and angry. She was having trouble controlling her anger so she came here. She has been here for a few hours and now feels much better. She denies any thoughts about harming herself or others. No hallucinations or delusions. Resting comfortably and asymptomatic at this time.     HPI    Nursing Notes were reviewed.    REVIEW OF SYSTEMS    (2-9 systems for level 4, 10 or more for level 5)     Review of Systems   Constitutional: Negative for fever.   Eyes: Negative for pain.   Respiratory: Negative for shortness of breath.    Cardiovascular: Negative for chest pain and palpitations.   Gastrointestinal: Negative for abdominal pain, diarrhea and vomiting.   Genitourinary: Negative for dysuria.   Skin: Negative for rash.   Neurological: Negative for weakness and headaches.   Psychiatric/Behavioral: Negative for hallucinations, self-injury and suicidal ideas.       A complete review of systems was performed and is negative except as noted above in the HPI.       PAST MEDICAL HISTORY     Past Medical History   Diagnosis Date   ??? Abdominal pain, right lower quadrant    ??? 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 Arthritis    ??? Bipolar disorder (HCC)     ??? Blood circulation, collateral    ??? Cancer (HCC)      skin cancer   ??? Diabetes mellitus (HCC)    ??? Hyperlipidemia    ??? MVP (mitral valve prolapse)    ??? Neuromuscular disorder (HCC) Inguinal Nerve pain   ??? Pleurisy without mention of effusion or current tuberculosis    ??? Right groin pain 02/28/2014   ??? Stomach ulcer      as a child   ??? Unspecified asthma(493.90)    ??? Unspecified constipation    ??? Unspecified hemorrhoids without mention of complication          SURGICAL HISTORY       Past Surgical History   Procedure Laterality Date   ??? Rotator cuff repair Right    ??? Tonsillectomy     ??? Hysterectomy     ??? Appendectomy     ??? Wisdom tooth extraction     ??? Abdominal exploration surgery       times two due to adhesions   ??? Colonoscopy  05/19/11     Dr Renato Gails   ??? Colonoscopy  07/15/2012     Dr. Zollie Beckers   ??? Skin biopsy  L and R wrists   ??? Abdomen surgery     ???  Colon surgery  04/2013     1 polyp removed         CURRENT MEDICATIONS       Discharge Medication List as of 11/07/2014  4:19 AM      CONTINUE these medications which have NOT CHANGED    Details   albuterol sulfate HFA 108 (90 BASE) MCG/ACT inhaler Inhale 2 puffs into the lungs 3 times daily      montelukast (SINGULAIR) 10 MG tablet Take 10 mg by mouth nightly      vitamin D (ERGOCALCIFEROL) 50000 UNITS CAPS capsule       traMADol (ULTRAM) 50 MG tablet Take 1 tablet by mouth every 6 hours as needed for Pain, Disp-45 tablet, R-2      sodium chloride (OCEAN, BABY AYR) 0.65 % nasal spray 1 spray by Nasal route as needed for Congestion, Disp-1 Bottle, R-0      QUEtiapine (SEROQUEL) 200 MG tablet Take 1 tablet by mouth nightly, Disp-30 tablet, R-0      traZODone (DESYREL) 100 MG tablet Take 1 tablet by mouth nightly as needed for Sleep, Disp-30 tablet, R-0      buPROPion SR (WELLBUTRIN SR) 150 MG SR tablet Take 1 tablet by mouth daily, Disp-60 tablet, R-3      OXcarbazepine (TRILEPTAL) 150 MG tablet Take 1 tablet by mouth 2 times daily, Disp-60 tablet, R-0      lubiprostone  (AMITIZA) 24 MCG capsule Take 1 capsule by mouth 2 times daily (with meals), Disp-60 capsule, R-11      glipiZIDE (GLUCOTROL) 10 MG tablet Take 1 tablet by mouth every morning (before breakfast), Disp-60 tablet, R-3      Multiple Vitamins-Minerals (THERAPEUTIC MULTIVITAMIN-MINERALS) tablet Take 1 tablet by mouth daily      ranitidine (ZANTAC) 150 MG tablet              ALLERGIES     Tobacco [nicotiana tabacum]; Decongestant [pseudoephedrine hcl]; Motrin [ibuprofen]; and Cabbage    FAMILY HISTORY       Family History   Problem Relation Age of Onset   ??? Alcohol Abuse Father    ??? Alcohol Abuse Mother    ??? Mental Illness Mother    ??? Hypertension Mother    ??? Alcohol Abuse Brother    ??? Cancer Paternal Grandfather    ??? Mental Illness Maternal Grandmother    ??? Hypertension Maternal Grandmother    ??? Mental Illness Daughter    ??? Colon Cancer Neg Hx    ??? Colon Polyps Neg Hx    ??? Liver Cancer Neg Hx    ??? Stomach Cancer Neg Hx    ??? Ulcerative Colitis Neg Hx    ??? Crohn's Disease Neg Hx    ??? Liver Disease Neg Hx    ??? Rectal Cancer Neg Hx           SOCIAL HISTORY       Social History     Social History   ??? Marital status: Married     Spouse name: Johnnie   ??? Number of children: 2   ??? Years of education: 37     Social History Main Topics   ??? Smoking status: Never Smoker   ??? Smokeless tobacco: Never Used   ??? Alcohol use No   ??? Drug use: No   ??? Sexual activity: Not Asked     Other Topics Concern   ??? None     Social History Narrative  SCREENINGS             PHYSICAL EXAM    (up to 7 for level 4, 8 or more for level 5)   ED Triage Vitals   BP Temp Temp Source Pulse Resp SpO2 Height Weight   11/06/14 2345 11/06/14 2345 11/06/14 2345 11/06/14 2345 11/07/14 0112 11/06/14 2345 11/06/14 2345 11/06/14 2345   156/100 98.1 ??F (36.7 ??C) Oral 106 18 94 %  (1.651 m) 197 lb (89.4 kg)       Physical Exam   Constitutional: She is oriented to person, place, and time. She appears well-developed. No distress.   HENT:   Head: Normocephalic and  atraumatic.   Eyes: EOM are normal. Pupils are equal, round, and reactive to light. No scleral icterus.   Neck: Normal range of motion. Neck supple. No JVD present.   Cardiovascular: Normal rate, regular rhythm, normal heart sounds and intact distal pulses.    Pulmonary/Chest: Effort normal and breath sounds normal. No respiratory distress.   Abdominal: Soft. She exhibits no distension. There is no tenderness.   Musculoskeletal: She exhibits no edema or tenderness.   Neurological: She is alert and oriented to person, place, and time. She has normal strength. No cranial nerve deficit or sensory deficit. Gait normal. GCS eye subscore is 4. GCS verbal subscore is 5. GCS motor subscore is 6.   Skin: Skin is warm and dry.   Psychiatric: She has a normal mood and affect. Her behavior is normal.   Vitals reviewed.      DIAGNOSTIC RESULTS       LABS:  Labs Reviewed   CBC WITH AUTO DIFFERENTIAL - Abnormal; Notable for the following:        Result Value    WBC 14.1 (*)     MCHC 32.8 (*)     MPV 10.9 (*)     Neutrophils Absolute 9.0 (*)     Monocytes Absolute 1.00 (*)     All other components within normal limits    Narrative:     specimen clotted called gina rn er 0111 recollection   COMPREHENSIVE METABOLIC PANEL - Abnormal; Notable for the following:     CO2 18 (*)     Glucose 189 (*)     BUN 21 (*)     CREATININE 1.0 (*)     GFR Non-African American 57 (*)     Total Protein 6.4 (*)     Total Bilirubin <0.2 (*)     All other components within normal limits   URINALYSIS - Abnormal; Notable for the following:     Leukocyte Esterase, Urine MODERATE (*)     All other components within normal limits   MICROSCOPIC URINALYSIS - Abnormal; Notable for the following:     WBC, UA 22 (*)     All other components within normal limits   URINE CULTURE   ETHANOL       All other labs were within normal range or not returned as of this dictation.    EMERGENCY DEPARTMENT COURSE and DIFFERENTIAL DIAGNOSIS/MDM:   Vitals:    Vitals:    11/06/14  2345 11/07/14 0112 11/07/14 0338 11/07/14 0424   BP: (!) 156/100 (!) 150/100 148/90 148/90   Pulse: 106 101 96 88   Resp:  Temp: 98.1 ??F (36.7 ??C)   98.1 ??F (36.7 ??C)   TempSrc: Oral      SpO2: 94% 95% 96% 97%   Weight:  197 lb (89.4 kg)      Height: 5\' 5"  (1.651 m)          MDM  Patient nontoxic on exam. In no distress. Seen by intake for psychiatry and discussed with on-call psychiatrist who feels patient safe to discharge home. Patient agreeable--feels better. She wants to go home. Looks like she has a UTI. Will treat empirically for this. Told to follow up with her therapist as well as her family doctor. Told to return for any change or worsening symptoms or new concerns. Patient agreeable with plan.    CONSULTS:  None    PROCEDURES:  Unless otherwise noted below, none     Procedures    FINAL IMPRESSION      1. Urinary tract infection without hematuria, site unspecified    2. Difficulty controlling anger          DISPOSITION/PLAN   DISPOSITION Decision to Discharge    PATIENT REFERRED TO:  [facility] EMERGENCY DEPT  [address]  [place] [address]  [phone]    As needed, If symptoms worsen    Sharmon Leyden, MD  [address]  [place] [address]  [phone]    Schedule an appointment as soon as possible for a visit        DISCHARGE MEDICATIONS:  Discharge Medication List as of 11/07/2014  4:19 AM      START taking these medications    Details   cephALEXin (KEFLEX) 500 MG capsule Take 1 capsule by mouth 2 times daily for 5 days, Disp-10 capsule, R-0                (Please note that portions of this note were completed with a voice recognition program.  Efforts were made to edit the dictations but occasionally words are mis-transcribed.)    Salome Holmes, MD (electronically signed)  Attending Emergency Physician       Salome Holmes, MD  11/07/14 [phone]

## 2014-11-07 NOTE — ED Notes (Signed)
Mental health evaluating     Marlene Lard, RN  11/07/14 [phone]

## 2014-11-07 NOTE — Discharge Instructions (Signed)
Urinary Tract Infection in Women: Care Instructions  Your Care Instructions     A urinary tract infection, or UTI, is a general term for an infection anywhere between the kidneys and the urethra (where urine comes out). Most UTIs are bladder infections. They often cause pain or burning when you urinate.  UTIs are caused by bacteria and can be cured with antibiotics. Be sure to complete your treatment so that the infection goes away.  Follow-up care is a key part of your treatment and safety. Be sure to make and go to all appointments, and call your doctor if you are having problems. It's also a good idea to know your test results and keep a list of the medicines you take.  How can you care for yourself at home?   Take your antibiotics as directed. Do not stop taking them just because you feel better. You need to take the full course of antibiotics.   Drink extra water and other fluids for the next day or two. This may help wash out the bacteria that are causing the infection. (If you have kidney, heart, or liver disease and have to limit fluids, talk with your doctor before you increase your fluid intake.)   Avoid drinks that are carbonated or have caffeine. They can irritate the bladder.   Urinate often. Try to empty your bladder each time.   To relieve pain, take a hot bath or lay a heating pad set on low over your lower belly or genital area. Never go to sleep with a heating pad in place.  To prevent UTIs   Drink plenty of water each day. This helps you urinate often, which clears bacteria from your system. (If you have kidney, heart, or liver disease and have to limit fluids, talk with your doctor before you increase your fluid intake.)   Consider adding cranberry juice to your diet.   Urinate when you need to.   Urinate right after you have sex.   Change sanitary pads often.   Avoid douches, bubble baths, feminine hygiene sprays, and other feminine hygiene products that have deodorants.   After  going to the bathroom, wipe from front to back.  When should you call for help?  Call your doctor now or seek immediate medical care if:   Symptoms such as fever, chills, nausea, or vomiting get worse or appear for the first time.   You have new pain in your back just below your rib cage. This is called flank pain.   There is new blood or pus in your urine.   You have any problems with your antibiotic medicine.  Watch closely for changes in your health, and be sure to contact your doctor if:   You are not getting better after taking an antibiotic for 2 days.   Your symptoms go away but then come back.  Where can you learn more?  Go to [email or web address] and sign in to your MyChart account. Enter [phone] in the Search Health Information box to learn more about "Urinary Tract Infection in Women: Care Instructions."    If you do not have an account, please click on the "Sign Up Now" link.   2006-2016 [employer]. Care instructions adapted under license by [facility] This care instruction is for use with your licensed healthcare professional. If you have questions about a medical condition or this instruction, always ask your healthcare professional. Healthwise, Incorporated disclaims any warranty or liability for your use of this information.  Content Version: 10.9.538570; Current as of: January 07, 2014        Bipolar Disorder: Care Instructions  Your Care Instructions  Bipolar disorder is an illness that causes extreme mood changes, from times of very high energy (manic episodes) to times of depression. But many people with bipolar disorder show only the symptoms of depression. These moods may cause problems with your work, school, family life, friendships, and how well you function.  This disease is also called manic-depression.  There is no cure for bipolar disorder, but it can be helped with medicines. Counseling may also help. It is important to take your medicines  exactly as prescribed, even when you feel well. You may need lifelong treatment.  Follow-up care is a key part of your treatment and safety. Be sure to make and go to all appointments, and call your doctor if you are having problems. It's also a good idea to know your test results and keep a list of the medicines you take.  How can you care for yourself at home?   Be safe with medicines. Take your medicines exactly as prescribed. Do not stop or change a medicine without talking to your doctor first. You and your doctor may need to try different combinations of medicines to find what works for you.   Take your medicines on schedule to keep your moods even. When you feel good, you may think that you do not need your medicines. But it is important to keep taking them.   Go to your counseling sessions. Call and talk with your counselor if you can't go to a session or if you don't think the sessions are helping. Do not just stop going.   Get at least 30 minutes of activity on most days of the week. Walking is a good choice. You also may want to do other things, such as running, swimming, or cycling.   Get enough sleep. Keep your room dark and quiet. Try to go to bed at the same time every night.   Eat a healthy diet. This includes whole grains, dairy, fruits, vegetables, and protein. Eat foods from each of these groups.   Try to lower your stress. Manage your time, build a strong support system, and lead a healthy lifestyle. To lower your stress, try physical activity, slow deep breathing, or getting a massage.   Do not use alcohol or illegal drugs.   Learn the early signs of your mood changes. You can then take steps to help yourself feel better.   Ask for help from friends and family when you need it. You may need help with daily chores when you are depressed. When you are manic, you may need support to control your high energy levels.  What should you do if someone in your family has bipolar  disorder?   Learn about the disease and the signs that it is getting worse.   Remind your family member that you love him or her.   Make a plan with all family members about how to take care of your loved one when his or her symptoms are bad.   Talk about your fears and concerns and those of other family members. Seek counseling if needed.   Do not focus attention only on the person who is in treatment.   Remind yourself that it will take time for changes to occur.   Do not blame yourself for the disease.   Know your legal rights and the legal rights  of your family member. Support groups or counselors can help you with this information.   Take care of yourself. Keep up with your own interests, such as your career, hobbies, and friends. Use exercise, positive self-talk, deep breathing, and other relaxing exercises to help lower your stress.   Give yourself time to grieve. You may need to deal with emotions such as anger, fear, and frustration. After you work through your feelings, you will be better able to care for yourself and your family.   If you are having a hard time with your feelings or with your relationship with your family member, talk with a counselor.  When should you call for help?  Call 911 anytime you think you may need emergency care. For example, call if:   You feel like hurting yourself or someone else.   Someone who has bipolar disorder displays dangerous behavior, and you think the person might hurt himself or herself or someone else.  Call your doctor now or seek immediate medical care if:   You hear voices.   Someone you know has bipolar disorder and talks about suicide. Keep the numbers for these national suicide hotlines: 1-800-273-TALK 450-598-2665) and 1-800-SUICIDE 806-189-4895). If a suicide threat seems real, with a specific plan and a way to carry it out, stay with the person, or ask someone you trust to stay with the person, until you can get help.   Someone you  know has bipolar disorder and:   Starts to give away possessions.   Is using illegal drugs or drinking alcohol heavily.   Talks or writes about death, including writing suicide notes or talking about guns, knives, or pills.   Talks or writes about hurting someone else.   Starts to spend a lot of time alone.   Acts very aggressively or suddenly appears calm.   Talks about beliefs that are not based in reality (delusions).  Watch closely for changes in your health, and be sure to contact your doctor if:   You cannot go to your counseling sessions.  Where can you learn more?  Go to [email or web address] and sign in to your MyChart account. Enter K052 in the Search Health Information box to learn more about "Bipolar Disorder: Care Instructions."    If you do not have an account, please click on the "Sign Up Now" link.   2006-2016 [employer], [employer]. Care instructions adapted under license by [facility]. This care instruction is for use with your licensed healthcare professional. If you have questions about a medical condition or this instruction, always ask your healthcare professional. Healthwise, Inc[employer] disclaims any warranty or liability for your use of this information.  Content Version: 10.9.538570; Current as of: January 07, 2014
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
## 2014-11-08 ENCOUNTER — Inpatient Hospital Stay
Admit: 2014-11-08 | Discharge: 2014-11-08 | Disposition: A | Discharge status: Home / Self Care | Payer: BLUE CROSS/BLUE SHIELD | Attending: Emergency Medicine

## 2014-11-08 DIAGNOSIS — F319 Bipolar disorder, unspecified: Principal | ICD-10-CM

## 2014-11-08 LAB — CBC WITH AUTO DIFFERENTIAL
Basophils %: 0.5 % (ref 0.0–1.0)
Basophils Absolute: 0 10*3/uL (ref 0.00–0.20)
Eosinophils %: 2.2 % (ref 0.0–5.0)
Eosinophils Absolute: 0.2 10*3/uL (ref 0.00–0.60)
Hematocrit: 43.8 % (ref 37.0–47.0)
Hemoglobin: 14.3 g/dL (ref 12.0–16.0)
Lymphocytes %: 32.3 % (ref 20.0–40.0)
Lymphocytes Absolute: 2.6 10*3/uL (ref 1.1–4.5)
MCH: 28.4 pg (ref 27.0–31.0)
MCHC: 32.6 g/dL — ABNORMAL LOW (ref 33.0–37.0)
MCV: 87.1 fL (ref 81.0–99.0)
MPV: 10.8 fL — ABNORMAL HIGH (ref 7.4–10.4)
Monocytes %: 5.5 % (ref 0.0–10.0)
Monocytes Absolute: 0.4 10*3/uL (ref 0.00–0.90)
Neutrophils %: 59.5 % (ref 50.0–65.0)
Neutrophils Absolute: 4.8 10*3/uL (ref 1.5–7.5)
Platelets: 195 10*3/uL (ref 130–400)
RBC: 5.03 M/uL (ref 4.20–5.40)
RDW: 13.3 % (ref 11.5–14.5)
WBC: 8 10*3/uL (ref 4.8–10.8)

## 2014-11-08 LAB — COMPREHENSIVE METABOLIC PANEL
ALT: 18 U/L (ref 5–33)
AST: 9 U/L (ref 5–32)
Albumin: 4.1 g/dL (ref 3.5–5.2)
Alkaline Phosphatase: 75 U/L (ref 35–104)
Anion Gap: 13 mmol/L (ref 7–19)
BUN: 23 mg/dL — ABNORMAL HIGH (ref 6–20)
CO2: 21 mmol/L — ABNORMAL LOW (ref 22–29)
Calcium: 9.5 mg/dL (ref 8.6–10.0)
Chloride: 106 mmol/L (ref 98–111)
Creatinine: 1.1 mg/dL — ABNORMAL HIGH (ref 0.5–0.9)
GFR Non-African American: 51 — AB (ref >60)
Globulin: 2.6 g/dL
Glucose: 149 mg/dL — ABNORMAL HIGH (ref 74–109)
Potassium: 4 mmol/L (ref 3.5–5.0)
Sodium: 140 mmol/L (ref 136–145)
Total Bilirubin: <0.2 mg/dL — AB (ref 0.2–1.2)
Total Protein: 6.7 g/dL (ref 6.6–8.7)

## 2014-11-08 LAB — URINALYSIS
Bilirubin Urine: NEGATIVE
Blood, Urine: NEGATIVE
Glucose, Ur: NEGATIVE mg/dL
Ketones, Urine: NEGATIVE mg/dL
Nitrite, Urine: NEGATIVE
Protein, UA: NEGATIVE mg/dL
Specific Gravity, UA: 1.018 (ref 1.005–1.030)
Urobilinogen, Urine: 0.2 E.U./dL (ref <2.0)
pH, UA: 5.5 (ref 5.0–8.0)

## 2014-11-08 LAB — MICROSCOPIC URINALYSIS
Bacteria, UA: NEGATIVE /HPF
Epithelial Cells, UA: 4 /HPF (ref 0–5)
Hyaline Casts, UA: 3 /HPF (ref 0–8)
RBC, UA: 1 /HPF (ref 0–4)
WBC, UA: 16 /HPF — ABNORMAL HIGH (ref 0–5)

## 2014-11-08 LAB — URINE DRUG SCREEN
Amphetamine Screen, Urine: POSITIVE — AB (ref <1000)
Barbiturate Screen, Ur: NEGATIVE (ref <200)
Benzodiazepine Screen, Urine: NEGATIVE (ref <100)
Cannabinoid Scrn, Ur: NEGATIVE (ref <50)
Cocaine Metabolite Screen, Urine: NEGATIVE (ref <300)
Opiate Scrn, Ur: NEGATIVE (ref <300)

## 2014-11-08 LAB — ETHANOL: Ethanol Lvl: <10 mg/dL

## 2014-11-08 NOTE — ED Notes (Signed)
Patient's husband is worried about taking the patient home, states "I can't take her home like this." Patient's discharge instructions are to follow up with [facility] and patient's husband said that since they did not go to the appointment there this morning they will have to wait for a cancellation, and that "I don't know if we can make it until then." Patient's husband also states that he doesn't have the gas to get home and back to another appointment. I will notify the case worker to see what we can do to help the patient and husband get home.      [address] [place], [place]  11/08/14 1346

## 2014-11-08 NOTE — ED Triage Notes (Signed)
States feels really angry to point she might hurt herself or hurt someone else for no particular reason.  Husband feels like meds out of whack.  Pt appears tired,  States she hits herself all the time.  Throws things all the time in the house at walls.  Hits her head on walls all the time.  Recently worse her husband. Husband states takes meds appropiately.
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
## 2014-11-08 NOTE — ED Triage Notes (Signed)
Husband has not filed rx for keflex she received here on Sunday, states they have no money, none even for gas to get home.

## 2014-11-08 NOTE — ED Notes (Signed)
I went to interview and assess the patient. Patient states that she has been very sleepy. She said that she has been angry a lot. When I asked her why, she stated "I don't know." When I asked her what she was angry about she said, "I don't know." When I asked if she was angry all of the time or sometimes she also stated, "I don't know.      [address] [place], [place]  11/08/14 [phone]

## 2014-11-08 NOTE — ED Notes (Signed)
Patient and husband sent to results waiting room to wait for case manager to speak with them.     [address] [place], [place]  11/08/14 1349

## 2014-11-08 NOTE — ED Notes (Signed)
ASSESSMENT:    PT ALERT/ORIENTED X4.  PUPILS EQUAL/REACTIVE    SKIN:  WARM/DRY PINK CAPILLARY REFILL < 2SECS    CARDIAC:  S1 S2 NOTED     LUNGS: CLEAR UPPER AND LOWER LOBES, RESPIRATIONS EVEN/UNLABORED     ABDOMEN: BOWEL SOUNDS NOTED UPPER AND LOWER QUADRANTS                     SOFT AND NONTENDER.    EXTREMITIES:  BILATERAL DP AND PT AND NO EDEMA NOTED.    NO DISTRESS NOTED.  SIDE RAILS UP AND CALL LIGHT IN REACH.     [address] [place], [place]  11/08/14 [phone]

## 2014-11-08 NOTE — Care Coordination-Inpatient (Signed)
CM requested to assist with transport home for patient. CM reviewed decision for discharge of patient with ER charge RN, ER Manager, and Lauren from [facility]. Lauren states, she would discuss again with Dr. Marga Melnick regarding patient's admission. Patient not appropriate for admission at this time. CM advised patient/spouse as patient is not being admitted today, CM could assist with cab voucher to home if couple do not have enough gas to get home. CM unable to give money. Couple's Pastor in the room. Per spouse, if there is a cancellation patient can be seen at [facility] on 11/10/2014. CM suggested couple may be able to receive assistance over the phone from clinic if they do not have money for gas to drive. Patient stated, they have enough gas to drive home and do not need a cab voucher.

## 2014-11-08 NOTE — ED Notes (Signed)
Patient is currently screaming, crying, was hitting herself but has now stopped, saying that she is angry, that they are sending her home and she told them she wants help. Security is in the room. Patient is still crying but no violent behavior at this time.      [address] [place], [place]  11/08/14 1324

## 2014-11-08 NOTE — Discharge Instructions (Signed)
Bipolar Disorder: Care Instructions  Your Care Instructions  Bipolar disorder is an illness that causes extreme mood changes, from times of very high energy (manic episodes) to times of depression. But many people with bipolar disorder show only the symptoms of depression. These moods may cause problems with your work, school, family life, friendships, and how well you function.  This disease is also called manic-depression.  There is no cure for bipolar disorder, but it can be helped with medicines. Counseling may also help. It is important to take your medicines exactly as prescribed, even when you feel well. You may need lifelong treatment.  Follow-up care is a key part of your treatment and safety. Be sure to make and go to all appointments, and call your doctor if you are having problems. It's also a good idea to know your test results and keep a list of the medicines you take.  How can you care for yourself at home?   Be safe with medicines. Take your medicines exactly as prescribed. Do not stop or change a medicine without talking to your doctor first. You and your doctor may need to try different combinations of medicines to find what works for you.   Take your medicines on schedule to keep your moods even. When you feel good, you may think that you do not need your medicines. But it is important to keep taking them.   Go to your counseling sessions. Call and talk with your counselor if you can't go to a session or if you don't think the sessions are helping. Do not just stop going.   Get at least 30 minutes of activity on most days of the week. Walking is a good choice. You also may want to do other things, such as running, swimming, or cycling.   Get enough sleep. Keep your room dark and quiet. Try to go to bed at the same time every night.   Eat a healthy diet. This includes whole grains, dairy, fruits, vegetables, and protein. Eat foods from each of these groups.   Try to lower your stress.  Manage your time, build a strong support system, and lead a healthy lifestyle. To lower your stress, try physical activity, slow deep breathing, or getting a massage.   Do not use alcohol or illegal drugs.   Learn the early signs of your mood changes. You can then take steps to help yourself feel better.   Ask for help from friends and family when you need it. You may need help with daily chores when you are depressed. When you are manic, you may need support to control your high energy levels.  What should you do if someone in your family has bipolar disorder?   Learn about the disease and the signs that it is getting worse.   Remind your family member that you love him or her.   Make a plan with all family members about how to take care of your loved one when his or her symptoms are bad.   Talk about your fears and concerns and those of other family members. Seek counseling if needed.   Do not focus attention only on the person who is in treatment.   Remind yourself that it will take time for changes to occur.   Do not blame yourself for the disease.   Know your legal rights and the legal rights of your family member. Support groups or counselors can help you with this information.   Take   care of yourself. Keep up with your own interests, such as your career, hobbies, and friends. Use exercise, positive self-talk, deep breathing, and other relaxing exercises to help lower your stress.   Give yourself time to grieve. You may need to deal with emotions such as anger, fear, and frustration. After you work through your feelings, you will be better able to care for yourself and your family.   If you are having a hard time with your feelings or with your relationship with your family member, talk with a counselor.  When should you call for help?  Call 911 anytime you think you may need emergency care. For example, call if:   You feel like hurting yourself or someone else.   Someone who has bipolar  disorder displays dangerous behavior, and you think the person might hurt himself or herself or someone else.  Call your doctor now or seek immediate medical care if:   You hear voices.   Someone you know has bipolar disorder and talks about suicide. Keep the numbers for these national suicide hotlines: 1-800-273-TALK (1-800-273-8255) and 1-800-SUICIDE (1-800-784-2433). If a suicide threat seems real, with a specific plan and a way to carry it out, stay with the person, or ask someone you trust to stay with the person, until you can get help.   Someone you know has bipolar disorder and:   Starts to give away possessions.   Is using illegal drugs or drinking alcohol heavily.   Talks or writes about death, including writing suicide notes or talking about guns, knives, or pills.   Talks or writes about hurting someone else.   Starts to spend a lot of time alone.   Acts very aggressively or suddenly appears calm.   Talks about beliefs that are not based in reality (delusions).  Watch closely for changes in your health, and be sure to contact your doctor if:   You cannot go to your counseling sessions.  Where can you learn more?  Go to [email or web address] and sign in to your MyChart account. Enter K052 in the Search Health Information box to learn more about "Bipolar Disorder: Care Instructions."    If you do not have an account, please click on the "Sign Up Now" link.   2006-2016 [employer]. Care instructions adapted under license by [facility]. This care instruction is for use with your licensed healthcare professional. If you have questions about a medical condition or this instruction, always ask your healthcare professional. [employer], Inc[employer] disclaims any warranty or liability for your use of this information.  Content Version: 10.9.538570; Current as of: January 07, 2014

## 2014-11-08 NOTE — ED Provider Notes (Signed)
[facility] EMER[facility] DEPT  eMERGENCY dEPARTMENT eNCOUnter      Pt Name: Joyce Cohen  MRN: [id number]  Birthdate 1955-07-18  Date of evaluation: 11/08/2014  Provider: Deretha Emory, MD    CHIEF COMPLAINT       Chief Complaint   Patient presents with   ??? Mental Health Problem         HISTORY OF PRESENT ILLNESS   (Location/Symptom, Timing/Onset, Context/Setting, Quality, Duration, Modifying Factors, Severity)  Note limiting factors.   Joyce Cohen is a 59 y.o. female who presents to the emergency department      Patient is a 59 y.o. female presenting with mental health disorder. The history is provided by the patient and the spouse.   Mental Health Problem   Presenting symptoms: depression and suicidal thoughts (no plan)    Presenting symptoms comment:  Hits herself with fists  Patient accompanied by:  Family member  Degree of incapacity (severity):  Severe  Timing:  Constant  Progression:  Unchanged  Chronicity:  Recurrent  Context: not alcohol use, not drug abuse, not noncompliant and not recent medication change    Relieved by:  Nothing  Worsened by:  Nothing  Ineffective treatments:  Antidepressants, antipsychotics and mood stabilizers  Associated symptoms: anhedonia, feelings of worthlessness and hypersomnia    Risk factors: hx of mental illness ("Bipolar 1")        Nursing Notes were reviewed.    REVIEW OF SYSTEMS    (2-9 systems for level 4, 10 or more for level 5)     Review of Systems   Musculoskeletal: Positive for back pain (chronic).   Psychiatric/Behavioral: Positive for suicidal ideas (no plan).   All other systems reviewed and are negative.      Except as noted above the remainder of the review of systems was reviewed and negative.       PAST MEDICAL HISTORY     Past Medical History   Diagnosis Date   ??? Abdominal pain, right lower quadrant    ??? 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 Arthritis    ??? Bipolar disorder (HCC)    ??? Blood circulation, collateral    ??? Cancer (HCC)       skin cancer   ??? Diabetes mellitus (HCC)    ??? Hyperlipidemia    ??? MVP (mitral valve prolapse)    ??? Neuromuscular disorder (HCC) Inguinal Nerve pain   ??? Pleurisy without mention of effusion or current tuberculosis    ??? Right groin pain 02/28/2014   ??? Stomach ulcer      as a child   ??? Unspecified asthma(493.90)    ??? Unspecified constipation    ??? Unspecified hemorrhoids without mention of complication          SURGICAL HISTORY       Past Surgical History   Procedure Laterality Date   ??? Rotator cuff repair Right    ??? Tonsillectomy     ??? Hysterectomy     ??? Appendectomy     ??? Wisdom tooth extraction     ??? Abdominal exploration surgery       times two due to adhesions   ??? Colonoscopy  05/19/11     Dr Renato Gails   ??? Colonoscopy  07/15/2012     Dr. Zollie Beckers   ??? Skin biopsy  L and R wrists   ??? Abdomen surgery     ??? Colon surgery  04/2013  1 polyp removed         CURRENT MEDICATIONS       Previous Medications    ALBUTEROL SULFATE HFA 108 (90 BASE) MCG/ACT INHALER    Inhale 2 puffs into the lungs 3 times daily    BUPROPION SR (WELLBUTRIN SR) 150 MG SR TABLET    Take 1 tablet by mouth daily    CEPHALEXIN (KEFLEX) 500 MG CAPSULE    Take 1 capsule by mouth 2 times daily for 5 days    GLIPIZIDE (GLUCOTROL) 10 MG TABLET    Take 1 tablet by mouth every morning (before breakfast)    LUBIPROSTONE (AMITIZA) 24 MCG CAPSULE    Take 1 capsule by mouth 2 times daily (with meals)    MONTELUKAST (SINGULAIR) 10 MG TABLET    Take 10 mg by mouth nightly    MULTIPLE VITAMINS-MINERALS (THERAPEUTIC MULTIVITAMIN-MINERALS) TABLET    Take 1 tablet by mouth daily    OXCARBAZEPINE (TRILEPTAL) 150 MG TABLET    Take 1 tablet by mouth 2 times daily    QUETIAPINE (SEROQUEL) 200 MG TABLET    Take 1 tablet by mouth nightly    RANITIDINE (ZANTAC) 150 MG TABLET        SODIUM CHLORIDE (OCEAN, BABY AYR) 0.65 % NASAL SPRAY    1 spray by Nasal route as needed for Congestion    TRAMADOL (ULTRAM) 50 MG TABLET    Take 1 tablet by mouth every 6 hours as needed for Pain     TRAZODONE (DESYREL) 100 MG TABLET    Take 1 tablet by mouth nightly as needed for Sleep    VITAMIN D (ERGOCALCIFEROL) 50000 UNITS CAPS CAPSULE           ALLERGIES     Tobacco [nicotiana tabacum]; Decongestant [pseudoephedrine hcl]; Motrin [ibuprofen]; and Cabbage    FAMILY HISTORY       Family History   Problem Relation Age of Onset   ??? Alcohol Abuse Father    ??? Alcohol Abuse Mother    ??? Mental Illness Mother    ??? Hypertension Mother    ??? Alcohol Abuse Brother    ??? Cancer Paternal Grandfather    ??? Mental Illness Maternal Grandmother    ??? Hypertension Maternal Grandmother    ??? Mental Illness Daughter    ??? Colon Cancer Neg Hx    ??? Colon Polyps Neg Hx    ??? Liver Cancer Neg Hx    ??? Stomach Cancer Neg Hx    ??? Ulcerative Colitis Neg Hx    ??? Crohn's Disease Neg Hx    ??? Liver Disease Neg Hx    ??? Rectal Cancer Neg Hx           SOCIAL HISTORY       Social History     Social History   ??? Marital status: Married     Spouse name: Johnnie   ??? Number of children: 2   ??? Years of education: 46     Social History Main Topics   ??? Smoking status: Never Smoker   ??? Smokeless tobacco: Never Used   ??? Alcohol use No   ??? Drug use: No   ??? Sexual activity: Not Asked     Other Topics Concern   ??? None     Social History Narrative       SCREENINGS             PHYSICAL EXAM    (up to 7 for level  4, 8 or more for level 5)   ED Triage Vitals   BP Temp Temp src Pulse Resp SpO2 Height Weight   11/08/14 1030 11/08/14 1030 -- 11/08/14 1030 11/08/14 1030 11/08/14 1030 11/08/14 1030 11/08/14 1030   142/94 97.2 ??F (36.2 ??C)  98 20 95 %  (1.651 m) 200 lb (90.7 kg)       Physical Exam   Constitutional: She is oriented to person, place, and time. She appears well-developed and well-nourished. No distress.   Keeps eyes closed during interview, but oreinted   HENT:   Mouth/Throat: Oropharynx is clear and moist.   Cardiovascular: Normal rate, regular rhythm and normal heart sounds.    Pulmonary/Chest: Effort normal and breath sounds normal.   Abdominal:  Soft. There is no tenderness.   Musculoskeletal: She exhibits no edema.   Neurological: She is oriented to person, place, and time. No cranial nerve deficit.   Skin: Skin is warm and dry. She is not diaphoretic.   Psychiatric:   Depressed affect   Nursing note and vitals reviewed.      DIAGNOSTIC RESULTS     EKG: All EKG's are interpreted by the Emergency Department Physician who either signs or Co-signs this chart in the absence of a cardiologist.        RADIOLOGY:   Non-plain film images such as CT, Ultrasound and MRI are read by the radiologist. Plain radiographic images are visualized and preliminarily interpreted by the emergency physician with the below findings:        Interpretation per the Radiologist below, if available at the time of this note:    No orders to display         ED BEDSIDE ULTRASOUND:   Performed by ED Physician - none    LABS:  Labs Reviewed   CBC WITH AUTO DIFFERENTIAL - Abnormal; Notable for the following:        Result Value    MCHC 32.6 (*)     MPV 10.8 (*)     All other components within normal limits   COMPREHENSIVE METABOLIC PANEL - Abnormal; Notable for the following:     CO2 21 (*)     Glucose 149 (*)     BUN 23 (*)     CREATININE 1.1 (*)     GFR Non-African American 51 (*)     Total Bilirubin <0.2 (*)     All other components within normal limits   URINALYSIS - Abnormal; Notable for the following:     Clarity, UA CLOUDY (*)     Leukocyte Esterase, Urine MODERATE (*)     All other components within normal limits   URINE DRUG SCREEN - Abnormal; Notable for the following:     Amphetamine Screen, Urine Positive (*)     All other components within normal limits   MICROSCOPIC URINALYSIS - Abnormal; Notable for the following:     WBC, UA 16 (*)     All other components within normal limits   URINE CULTURE   ETHANOL       All other labs were within normal range or not returned as of this dictation.    EMERGENCY DEPARTMENT COURSE and DIFFERENTIAL DIAGNOSIS/MDM:   Vitals:    Vitals:     11/08/14 1030   BP: (!) 142/94   Pulse: 98   Resp: 20   Temp: 97.2 ??F (36.2 ??C)   SpO2: 95%   Weight: 200 lb (90.7 kg)   Height:  (  1.651 m)           MDM  Number of Diagnoses or Management Options  Diagnosis management comments: Psych intake sees pt, feels safe for d/c, f/u with [facility].      CRITICAL CARE TIME   Total Critical Care time was 0 minutes, excluding separately reportable procedures.  There was a high probability of clinically significant/life threatening deterioration in the patient's condition which required my urgent intervention.      CONSULTS:  IP CONSULT TO PSYCHIATRY    PROCEDURES:  Unless otherwise noted below, none     Procedures    FINAL IMPRESSION      1. Bipolar 1 disorder (HCC)          DISPOSITION/PLAN   DISPOSITION     PATIENT REFERRED TO:  [facility]    Today        DISCHARGE MEDICATIONS:  New Prescriptions    No medications on file          (Please note that portions of this note were completed with a voice recognition program.  Efforts were made to edit the dictations but occasionally words are mis-transcribed.)    Deretha Emory, MD (electronically signed)  Attending Emergency Physician       Deretha Emory, MD  11/08/14 [phone]

## 2014-11-09 LAB — CULTURE, URINE: Urine Culture, Routine: >100000

## 2014-11-10 LAB — CULTURE, URINE: Urine Culture, Routine: <10000

## 2014-12-26 ENCOUNTER — Encounter

## 2014-12-26 ENCOUNTER — Encounter: Attending: Pain Medicine | Primary: Emergency Medicine

## 2014-12-26 ENCOUNTER — Inpatient Hospital Stay
Admit: 2014-12-26 | Discharge: 2014-12-26 | Payer: BLUE CROSS/BLUE SHIELD | Attending: Pain Medicine | Primary: Emergency Medicine

## 2014-12-26 ENCOUNTER — Inpatient Hospital Stay: Admit: 2014-12-26 | Discharge: 2014-12-26 | Payer: BLUE CROSS/BLUE SHIELD | Primary: Emergency Medicine

## 2014-12-26 DIAGNOSIS — M5459 Other low back pain: Secondary | ICD-10-CM

## 2014-12-26 DIAGNOSIS — M5416 Radiculopathy, lumbar region: Secondary | ICD-10-CM

## 2014-12-26 MED ORDER — TRAMADOL HCL 50 MG PO TABS
50 MG | ORAL_TABLET | Freq: Four times a day (QID) | ORAL | 2 refills | Status: DC | PRN
Start: 2014-12-26 — End: 2015-01-26

## 2014-12-26 MED ORDER — SODIUM CHLORIDE 0.9 % IJ SOLN
0.9 % | INTRAMUSCULAR | Status: AC
Start: 2014-12-26 — End: ?

## 2014-12-26 MED ORDER — BUPIVACAINE HCL (PF) 0.25 % IJ SOLN
0.25 % | INTRAMUSCULAR | Status: AC
Start: 2014-12-26 — End: ?

## 2014-12-26 MED ORDER — METHYLPREDNISOLONE ACETATE 80 MG/ML IJ SUSP
80 MG/ML | INTRAMUSCULAR | Status: AC
Start: 2014-12-26 — End: ?

## 2014-12-26 MED ORDER — BUPIVACAINE HCL (PF) 0.25 % IJ SOLN
0.25 % | Freq: Once | INTRAMUSCULAR | Status: AC | PRN
Start: 2014-12-26 — End: 2014-12-26
  Administered 2014-12-26: 20:00:00 5 via EPIDURAL

## 2014-12-26 MED ORDER — SODIUM CHLORIDE 0.9 % IJ SOLN
0.9 % | Freq: Once | INTRAMUSCULAR | Status: AC | PRN
Start: 2014-12-26 — End: 2014-12-26
  Administered 2014-12-26: 20:00:00 4

## 2014-12-26 MED ORDER — LIDOCAINE HCL (PF) 1 % IJ SOLN
1 % | Freq: Once | INTRAMUSCULAR | Status: AC | PRN
Start: 2014-12-26 — End: 2014-12-26
  Administered 2014-12-26: 20:00:00 3 via SUBCUTANEOUS

## 2014-12-26 MED ORDER — IOHEXOL 240 MG/ML IJ SOLN
240 MG/ML | Freq: Once | INTRAMUSCULAR | Status: AC | PRN
Start: 2014-12-26 — End: 2014-12-26
  Administered 2014-12-26: 20:00:00 2 via EPIDURAL

## 2014-12-26 MED ORDER — METHYLPREDNISOLONE ACETATE 80 MG/ML IJ SUSP
80 MG/ML | Freq: Once | INTRAMUSCULAR | Status: AC | PRN
Start: 2014-12-26 — End: 2014-12-26
  Administered 2014-12-26: 20:00:00 80 via EPIDURAL

## 2014-12-26 MED ORDER — LIDOCAINE-EPINEPHRINE 1 %-1:200000 IJ SOLN
1 percent-:200000 | Freq: Once | INTRAMUSCULAR | Status: AC | PRN
Start: 2014-12-26 — End: 2014-12-26
  Administered 2014-12-26: 20:00:00 2 via EPIDURAL

## 2014-12-26 MED FILL — SODIUM CHLORIDE 0.9 % IJ SOLN: 0.9 % | INTRAMUSCULAR | Qty: 10

## 2014-12-26 MED FILL — DEPO-MEDROL 80 MG/ML IJ SUSP: 80 MG/ML | INTRAMUSCULAR | Qty: 1

## 2014-12-26 MED FILL — SENSORCAINE-MPF 0.25 % IJ SOLN: 0.25 % | INTRAMUSCULAR | Qty: 10

## 2014-12-26 NOTE — Discharge Instructions (Signed)
Dr. Claudie Revering  Discharge Instructions / Information        You have had the procedure(s) called:    []  Cervical Epidural     [x]  Lumbar Epidural     []  _____________Injection  []  Cervical Facets  []  Lumbar Facets []  Radiofrequency Lesioning  []  Occipital Nerve Blocks []  Caudal Epidural []  Transforaminal Epidural  []  Trigger Point Injections []  SI Joint Injection []  ______________________    Please follow these instructions carefully.      If you have questions or problems you may call [phone].     You have received the following medications:  [x]  Lidocaine [x]  Bupivacaine: []  0.50%   [x]  0.25% [x]  DepoMedrol   []  Normal Saline  [x]  Omnipaque  []  Versed []  Sodium Bicarbonate    []  Botox  []  Kenalog   []  Diprivan  []  _____________________    PATIENT INFORMATION:  You may experience the following symptoms after your procedure.  These symptoms are normal and should not cause alarm.    *An increase in our pain may occur.  This may last 24-48 hours after your procedure.  *No change in pain.  *Weakness or numbness in your affected extremity.  This will usually only last a few hours.    REMEMBER TO REPORT THE FOLLOWING SYMPTOMS TO YOUR DOCTOR:  *Redness, swelling. Or drainage at the injection site.    *Unusual pain that interferes with your usual activities of daily living.    OTHER INSTRUCTIONS:  [x]  I will apply ice to the injection site for 24 hours.  Apply 15-20 minutes and take off 40 minutes.  [x]  I understand if a steroid was used it will take effect in 3-5 days.  [x]  I have received my personal belongings and valuable.  [x]  I have received a copy of my discharge instructions and understand to my satisfaction.  [x]  I will not drive for []  12 hours    [x]  24 hours after my injections.  [x]  I understand that today I will  [x]  rest  []  walk and move freely  []  other   []  Additional instructions:            Patient Discharge:  [x]  Home  []  Hospital  []  Other     How:  []  Ambulatory  [x]  Wheelchair   []  Stretcher    []  Other      Accompanied by:  [x]  Family Member  []  Friend  []  Hospital Staff  []  Ambulance Staff    []  Other

## 2014-12-26 NOTE — Progress Notes (Shared)
Procedure:  Level of Consciousness: [x] Alert [x] Oriented [] Disoriented [] Lethargic  Anxiety Level: [x] Calm [] Anxious [] Depressed [] Other  Skin: [x] Warm [x] Dry [] Cool [] Moist [] Intact [] Other  Cardiovascular: [] Palpitations: [x] Never [] Occasionally [] Frequently  Chest Pain: [x] No [] Yes  Respiratory:  [x] Unlabored [] Labored [] Cough ([]  Productive [] Unproductive)  HCG Required: [x] No [] Yes   Results: [] Negative [] Positive  Knowledge Level:        [x] Patient/Other verbalized understanding of pre-procedure instructions.        [x] Assessment of post-op care needs (transportation, responsible caregiver)        [x] Able to discuss health care problems and how to deal with it.  Factors that Affect Teaching:        Language Barrier: [x] No [] Yes - why:        Hearing Loss:        [x] No [] Yes            Corrective Device:  [] Yes [] No        Vision Loss:           [] No [x] Yes            Corrective Device:  [x] Yes [] No        Memory Loss:       [x] No [] Yes            [] Short Term [] Long Term  Motivational Level:  [x] Asks Questions                  [] Extremely Anxious       [x] Seems Interested               [] Seems Uninterested                  [x] Denies need for Education  Risk for Injury:  [x] Patient oriented to person, place and time  [x] History of frequent falls/loss of balance  Nutritional:  [] Change in appetite   [] Weight Gain   [] Weight Loss  Functional:  [] Requires assistance with ADL'sNursing Admission Record    Current Issues / Falls / ER Visits:  No    Percentage of Pain Relief after Last Procedure:  100 %    How long lasted:  6 weeks    Radiology exams received during the last 12 months: No       When na                                              [facility]       Imaging on chart: No         Imaging records requested: No  Physical therapy during the last 12 months: No       When: na                                             Where na  Labs during the last 12 months: No    Education Provided:  []  Review of [place]  []   Agreement Review  []  Compliance Issues Discussed    []  Cognitive Behavior Needs []  Exercise []  Review of Test []  Financial Issues  []  Tobacco/Alcohol Use []  Teaching []  New Patient []  Picture Obtained    Physician Plan:  []  Outgoing Referral  []  Pharmacy Consult  []  Test Ordered   []  Obtained Test  Results / Consult Notes  []  UDS due at next visit, verified per EPIC      []  Suspected Physical Abuse or Suicide Risk assessed - IF YES COMPLETE QUESTIONS BELOW    If any of the following questions are answered yes - contact attending physician for referral:    Has been considering harming self to escape stress, pain problems?  []  YES  []  NO  Has a suicide plan? []  YES  []  NO  Has attempted suicide in the past?   []  YES  []  NO  Has a close friend or family member who committed suicide?  []  YES  []  NO    Patient Referred To :     Additional Notes:    Assessment Completed by:  Electronically signed by Newt Lukes, RN on 12/26/2014 at 2:02 PM

## 2014-12-26 NOTE — Procedures (Signed)
DATE: 12/26/2014      REASON FOR VISIT: Principal Problem:    Right lumbar radiculopathy        PROCEDURE: Lumbar Epidural Steroid Injection.   []  Moderate Sedation    DESCRIPTION OF PROCEDURE:              After obtaining informed consent, the patient was taken to the procedure room, positioned prone, and sterilely prepped.  The procedure was performed under fluoroscopic guidance.  First 5 ml of 1% Xylocaine was used at T12-L41for local anesthesia.  A 19-gauge Hustead needle was advanced to the epidural space.  The was confirmed on the fluoroscope with an injection of [x]  2ml  Contrast.  Then, [x]  5ml of 0.25% Marcaine, [x]  4ml of Normal Saline, and [x]  80 mg of Depo Medrol was gently injected.        There were no complications.  She has been grossly improved with the last injection. Was able to clean her house. Mood is good.  DIAGNOSES:  []  Low Back Pain  [x]  *Lumbar Radiculopathy  []  *Lumbar Degenerative Disc Disease  []  *Lumbar Spinal Stenosis  []  *Lumbar Postlaminectomy Syndrome  [x]  Other Thoracic radiculopathy    PLAN:  [x]  Will return to office in  3 month(s)for  [x]  Planned Procedure  []  Office Visit  []  Prescriptions were given today   []  No prescriptions needed today  []  Patient is to call with any questions or concerns which may arise prior to the next office visit.    Procedure:  Level of Consciousness: [x] Alert [x] Oriented [] Disoriented [] Lethargic  Anxiety Level: [x] Calm [] Anxious [] Depressed [] Other  Skin: [x] Warm [x] Dry [] Cool [] Moist [] Intact [] Other  Cardiovascular: [] Palpitations: [x] Never [] Occasionally [] Frequently  Chest Pain: [x] No [] Yes  Respiratory: [x] Unlabored [] Labored [] Cough ([]  Productive [] Unproductive)  HCG Required: [x] No [] Yes Results: [] Negative [] Positive  Knowledge Level:  [x] Patient/Other verbalized understanding of pre-procedure instructions.  [x] Assessment of post-op care needs (transportation, responsible caregiver)  [x] Able to discuss health care problems and how to deal  with it.  Factors that Affect Teaching:  Language Barrier: [x] No [] Yes - why:  Hearing Loss: [x] No [] Yes Corrective Device: [] Yes [] No  Vision Loss: [] No [x] Yes Corrective Device: [x] Yes [] No  Memory Loss: [x] No [] Yes [] Short Term [] Long Term  Motivational Level:  [x] Asks Questions [] Extremely Anxious   [x] Seems Interested [] Seems Uninterested   [x] Denies need for Education  Risk for Injury:  [x] Patient oriented to person, place and time  [x] History of frequent falls/loss of balance  Nutritional:  [] Change in appetite [] Weight Gain [] Weight Loss  Functional:  [] Requires assistance with ADL'sNursing Admission Record    Current Issues / Falls / ER Visits: No    Percentage of Pain Relief after Last Procedure: 100 % How long lasted: 6 weeks    Radiology exams received during the last 12 months: No  When na [facility]  Imaging on chart: No   Imaging records requested: No  Physical therapy during the last 12 months: No  When: na Where na  Labs during the last 12 months: No    COMMENTS:    Patient complains of pain in back. Pain that radiates around into right groin. Pain is rated 6/10.Patient has flu shot today.  Discussed the risk and benefits of procedure. Will proceed today with Lumbar Epidural Steroid Injections today.        Beatrix Fetters, MD                []  Over 50% of today's appointment was given to discussion, evaluation  and counseling.

## 2015-01-05 ENCOUNTER — Emergency Department: Admit: 2015-01-05 | Payer: BLUE CROSS/BLUE SHIELD | Primary: Emergency Medicine

## 2015-01-05 ENCOUNTER — Inpatient Hospital Stay
Admit: 2015-01-05 | Discharge: 2015-01-06 | Disposition: A | Discharge status: Home / Self Care | Payer: BLUE CROSS/BLUE SHIELD | Attending: Emergency Medicine

## 2015-01-05 DIAGNOSIS — R0789 Other chest pain: Secondary | ICD-10-CM

## 2015-01-05 LAB — CBC
Hematocrit: 39.6 % (ref 37.0–47.0)
Hemoglobin: 13 g/dL (ref 12.0–16.0)
MCH: 28.4 pg (ref 27.0–31.0)
MCHC: 32.8 g/dL — ABNORMAL LOW (ref 33.0–37.0)
MCV: 86.5 fL (ref 81.0–99.0)
MPV: 10.6 fL — ABNORMAL HIGH (ref 7.4–10.4)
Platelets: 225 10*3/uL (ref 130–400)
RBC: 4.58 M/uL (ref 4.20–5.40)
RDW: 12.8 % (ref 11.5–14.5)
WBC: 9.9 10*3/uL (ref 4.8–10.8)

## 2015-01-05 NOTE — ED Notes (Signed)
Bed: 12  Expected date:   Expected time:   Means of arrival: [facility]  Comments:  EMS: Chest pain     Juleen China Spurlin  01/05/15 1819

## 2015-01-05 NOTE — Discharge Instructions (Signed)
Chest Pain: Care Instructions  Your Care Instructions  There are many things that can cause chest pain. Some are not serious and will get better on their own in a few days. But some kinds of chest pain need more testing and treatment. Your doctor may have recommended a follow-up visit in the next 8 to 12 hours. If you are not getting better, you may need more tests or treatment.  Even though your doctor has released you, you still need to watch for any problems. The doctor carefully checked you, but sometimes problems can develop later. If you have new symptoms or if your symptoms do not get better, get medical care right away.  If you have worse or different chest pain or pressure that lasts more than 5 minutes or you passed out (lost consciousness), call 911 or seek other emergency help right away.   A medical visit is only one step in your treatment. Even if you feel better, you still need to do what your doctor recommends, such as going to all suggested follow-up appointments and taking medicines exactly as directed. This will help you recover and help prevent future problems.  How can you care for yourself at home?   Rest until you feel better.   Take your medicine exactly as prescribed. Call your doctor if you think you are having a problem with your medicine.   Do not drive after taking a prescription pain medicine.  When should you call for help?  Call 911 if:    You passed out (lost consciousness).   You have severe difficulty breathing.   You have symptoms of a heart attack. These may include:   Chest pain or pressure, or a strange feeling in your chest.   Sweating.   Shortness of breath.   Nausea or vomiting.   Pain, pressure, or a strange feeling in your back, neck, jaw, or upper belly or in one or both shoulders or arms.   Lightheadedness or sudden weakness.   A fast or irregular heartbeat.  After you call 911, the operator may tell you to chew 1 adult-strength or 2 to 4 low-dose aspirin.  Wait for an ambulance. Do not try to drive yourself.  Call your doctor today if:    You have any trouble breathing.   Your chest pain gets worse.   You are dizzy or lightheaded, or you feel like you may faint.   You are not getting better as expected.   You are having new or different chest pain.  Where can you learn more?  Go to [email or web address] and sign in to your MyChart account. Enter A120 in the Search Health Information box to learn more about "Chest Pain: Care Instructions."    If you do not have an account, please click on the "Sign Up Now" link.   2006-2016 [employer]. Care instructions adapted under license by [facility]. This care instruction is for use with your licensed healthcare professional. If you have questions about a medical condition or this instruction, always ask your healthcare professional. [employer] disclaims any warranty or liability for your use of this information.  Content Version: 11.0.578772; Current as of: Jul 15, 2014

## 2015-01-05 NOTE — ED Provider Notes (Signed)
[facility]  eMERGENCY dEPARTMENT eNCOUnter      Pt Name: Joyce Cohen  MRN: [id number]  Birthdate [birth year]  Date of evaluation: 01/05/2015  Provider: Sissy Hoff, MD    CHIEF COMPLAINT       Chief Complaint   Patient presents with   ??? Chest Pain     presents to er with c/o chest pain, onset today         HISTORY OF PRESENT ILLNESS   (Location/Symptom, Timing/Onset, Context/Setting, Quality, Duration, Modifying Factors, Severity)  Note limiting factors.   Joyce Cohen is a 59 y.o. female who presents to the emergency department 59 year old white female no history of coronary artery disease exposures 5:30 this evening sure of chest pain substernally with some radiation to her left arm in route to the ER paramedics gave her an aspirin and sublingual electrical instrument which relieved her pain she is now asymptomatic denies shortness of breath palpitations or dizziness     HPI    Nursing Notes were reviewed.    REVIEW OF SYSTEMS    (2-9 systems for level 4, 10 or more for level 5)     Review of Systems   Constitutional: Negative.  Negative for activity change, appetite change, chills, diaphoresis and fatigue.   HENT: Negative.  Negative for congestion and drooling.    Eyes: Negative.  Negative for pain, discharge, redness and itching.   Respiratory: Negative.  Negative for apnea, cough and chest tightness.    Cardiovascular: Positive for chest pain.   Gastrointestinal: Negative for abdominal pain, constipation and diarrhea.   Endocrine: Negative.  Negative for cold intolerance.   Genitourinary: Negative.  Negative for difficulty urinating, dyspareunia and frequency.   Allergic/Immunologic: Positive for environmental allergies.   Neurological: Negative.  Negative for dizziness, seizures, syncope and speech difficulty.   Hematological: Negative.    Psychiatric/Behavioral: Negative for agitation, behavioral problems and dysphoric mood.   All other systems reviewed and are negative.            PAST MEDICAL HISTORY     Past Medical History   Diagnosis Date   ??? Abdominal pain, right lower quadrant    ??? 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 Arthritis    ??? Bipolar disorder (HCC)    ??? Blood circulation, collateral    ??? Cancer (HCC)      skin cancer   ??? Diabetes mellitus (HCC)    ??? Hyperlipidemia    ??? MVP (mitral valve prolapse)    ??? Neuromuscular disorder (HCC) Inguinal Nerve pain   ??? Pleurisy without mention of effusion or current tuberculosis    ??? Right groin pain 02/28/2014   ??? Stomach ulcer      as a child   ??? Unspecified asthma(493.90)    ??? Unspecified constipation    ??? Unspecified hemorrhoids without mention of complication          SURGICAL HISTORY       Past Surgical History   Procedure Laterality Date   ??? Rotator cuff repair Right    ??? Tonsillectomy     ??? Hysterectomy     ??? Appendectomy     ??? Wisdom tooth extraction     ??? Abdominal exploration surgery       times two due to adhesions   ??? Colonoscopy  05/19/11     Dr Renato Gails   ??? Colonoscopy  07/15/2012     Dr. Zollie Beckers   ???  Skin biopsy  L and R wrists   ??? Abdomen surgery     ??? Colon surgery  04/2013     1 polyp removed         CURRENT MEDICATIONS       Previous Medications    ALBUTEROL SULFATE HFA 108 (90 BASE) MCG/ACT INHALER    Inhale 2 puffs into the lungs 3 times daily    BUPROPION SR (WELLBUTRIN SR) 150 MG SR TABLET    Take 1 tablet by mouth daily    GLIPIZIDE (GLUCOTROL) 10 MG TABLET    Take 1 tablet by mouth every morning (before breakfast)    LUBIPROSTONE (AMITIZA) 24 MCG CAPSULE    Take 1 capsule by mouth 2 times daily (with meals)    MONTELUKAST (SINGULAIR) 10 MG TABLET    Take 10 mg by mouth nightly    MULTIPLE VITAMINS-MINERALS (THERAPEUTIC MULTIVITAMIN-MINERALS) TABLET    Take 1 tablet by mouth daily    OXCARBAZEPINE (TRILEPTAL) 150 MG TABLET    Take 1 tablet by mouth 2 times daily    QUETIAPINE (SEROQUEL) 200 MG TABLET    Take 1 tablet by mouth nightly    RANITIDINE (ZANTAC) 150 MG TABLET        SODIUM CHLORIDE (OCEAN, BABY  AYR) 0.65 % NASAL SPRAY    1 spray by Nasal route as needed for Congestion    TRAMADOL (ULTRAM) 50 MG TABLET    Take 1 tablet by mouth every 6 hours as needed for Pain    TRAZODONE (DESYREL) 100 MG TABLET    Take 1 tablet by mouth nightly as needed for Sleep    VITAMIN D (ERGOCALCIFEROL) 50000 UNITS CAPS CAPSULE           ALLERGIES     Tobacco [nicotiana tabacum]; Decongestant [pseudoephedrine hcl]; Motrin [ibuprofen]; and Cabbage    FAMILY HISTORY       Family History   Problem Relation Age of Onset   ??? Alcohol Abuse Father    ??? Alcohol Abuse Mother    ??? Mental Illness Mother    ??? Hypertension Mother    ??? Alcohol Abuse Brother    ??? Cancer Paternal Grandfather    ??? Mental Illness Maternal Grandmother    ??? Hypertension Maternal Grandmother    ??? Mental Illness Daughter    ??? Colon Cancer Neg Hx    ??? Colon Polyps Neg Hx    ??? Liver Cancer Neg Hx    ??? Stomach Cancer Neg Hx    ??? Ulcerative Colitis Neg Hx    ??? Crohn's Disease Neg Hx    ??? Liver Disease Neg Hx    ??? Rectal Cancer Neg Hx           SOCIAL HISTORY       Social History     Social History   ??? Marital status: Married     Spouse name: Johnnie   ??? Number of children: 2   ??? Years of education: 41     Social History Main Topics   ??? Smoking status: Never Smoker   ??? Smokeless tobacco: Never Used   ??? Alcohol use No   ??? Drug use: No   ??? Sexual activity: Not Asked     Other Topics Concern   ??? None     Social History Narrative       SCREENINGS             PHYSICAL EXAM    (up to  7 for level 4, 8 or more for level 5)   ED Triage Vitals   BP Temp Temp src Pulse Resp SpO2 Height Weight   01/05/15 1821 01/05/15 1821 -- 01/05/15 1821 01/05/15 1821 -- 01/05/15 1821 01/05/15 1821   112/56 98.2 ??F (36.8 ??C)  89 18  5\' 5"  (1.651 m) 195 lb (88.5 kg)       Physical Exam   Constitutional: She appears well-developed and well-nourished.   HENT:   Head: Normocephalic and atraumatic.   Right Ear: External ear normal.   Eyes: Conjunctivae are normal. Pupils are equal, round, and reactive to  light.   Neck: No tracheal deviation present. No thyromegaly present.   Cardiovascular: Normal rate and normal heart sounds.  Exam reveals no gallop and no friction rub.    No murmur heard.  Pulmonary/Chest: No respiratory distress. She has no wheezes. She has no rales.   Abdominal: She exhibits no distension. There is no tenderness. There is no rebound.   Musculoskeletal: She exhibits no tenderness or deformity.   Neurological: No cranial nerve deficit.   Skin: No rash noted. No erythema. No pallor.       DIAGNOSTIC RESULTS     EKG: All EKG's are interpreted by the Emergency Department Physician who either signs or Co-signs this chart in the absence of a cardiologist.        RADIOLOGY:   Non-plain film images such as CT, Ultrasound and MRI are read by the radiologist. Plain radiographic images are visualized and preliminarily interpreted by the emergency physician with the below findings:          XR Chest Portable   Final Result              LABS:  Labs Reviewed   CBC - Abnormal; Notable for the following:        Result Value    MCHC 32.8 (*)     MPV 10.6 (*)     All other components within normal limits   COMPREHENSIVE METABOLIC PANEL - Abnormal; Notable for the following:     Glucose 189 (*)     CREATININE 1.3 (*)     GFR Non-African American 42 (*)     Total Protein 6.3 (*)     All other components within normal limits   TROPONIN       All other labs were within normal range or not returned as of this dictation.    EMERGENCY DEPARTMENT COURSE and DIFFERENTIAL DIAGNOSIS/MDM:   Vitals:    Vitals:    01/05/15 1821 01/05/15 1900   BP: (!) 112/56 (!) 118/58   Pulse: 89 84   Resp: 18 18   Temp: 98.2 ??F (36.8 ??C)    SpO2: 99% 98%   Weight: 195 lb (88.5 kg)    Height: 5\' 5"  (1.651 m)        MDM    Reassessment    CONSULTS:  None    PROCEDURES:  Unless otherwise noted below, none     Procedures    FINAL IMPRESSION      1. Chest wall pain          DISPOSITION/PLAN   DISPOSITION Decision to Discharge    PATIENT REFERRED  TO:  Sharmon LeydenJohn W Brazzell, MD  [address]  [place] [id number]  [phone]            DISCHARGE MEDICATIONS:  New Prescriptions    No medications on file          (  Please note that portions of this note were completed with a voice recognition program.  Efforts were made to edit the dictations but occasionally words are mis-transcribed.)    Sissy Hoff, MD (electronically signed)  Attending Emergency Physician           Sissy Hoff, MD  01/05/15 [phone]

## 2015-01-05 NOTE — ED Triage Notes (Signed)
Presents to er with c/o chest pain onset today, ems gave 324 mg asa pta

## 2015-01-06 LAB — COMPREHENSIVE METABOLIC PANEL
ALT: 26 U/L (ref 5–33)
AST: 13 U/L (ref 5–32)
Albumin: 3.8 g/dL (ref 3.5–5.2)
Alkaline Phosphatase: 82 U/L (ref 35–104)
Anion Gap: 15 mmol/L (ref 7–19)
BUN: 19 mg/dL (ref 6–20)
CO2: 22 mmol/L (ref 22–29)
Calcium: 8.9 mg/dL (ref 8.6–10.0)
Chloride: 102 mmol/L (ref 98–111)
Creatinine: 1.3 mg/dL — ABNORMAL HIGH (ref 0.5–0.9)
GFR Non-African American: 42 — AB (ref >60)
Globulin: 2.5 g/dL
Glucose: 189 mg/dL — ABNORMAL HIGH (ref 74–109)
Potassium: 4 mmol/L (ref 3.5–5.0)
Sodium: 139 mmol/L (ref 136–145)
Total Bilirubin: 0.3 mg/dL (ref 0.2–1.2)
Total Protein: 6.3 g/dL — ABNORMAL LOW (ref 6.6–8.7)

## 2015-01-06 LAB — TROPONIN: Troponin: <0.01 ng/mL (ref 0.00–0.03)

## 2015-01-09 ENCOUNTER — Inpatient Hospital Stay
Admit: 2015-01-09 | Discharge: 2015-01-09 | Disposition: A | Discharge status: Home / Self Care | Payer: BLUE CROSS/BLUE SHIELD

## 2015-01-09 ENCOUNTER — Emergency Department: Admit: 2015-01-09 | Payer: BLUE CROSS/BLUE SHIELD | Primary: Emergency Medicine

## 2015-01-09 DIAGNOSIS — R0789 Other chest pain: Secondary | ICD-10-CM

## 2015-01-09 LAB — COMPREHENSIVE METABOLIC PANEL
ALT: 25 U/L (ref 5–33)
AST: 12 U/L (ref 5–32)
Albumin: 4.2 g/dL (ref 3.5–5.2)
Alkaline Phosphatase: 90 U/L (ref 35–104)
Anion Gap: 13 mmol/L (ref 7–19)
BUN: 12 mg/dL (ref 6–20)
CO2: 24 mmol/L (ref 22–29)
Calcium: 9.7 mg/dL (ref 8.6–10.0)
Chloride: 104 mmol/L (ref 98–111)
Creatinine: 1 mg/dL — ABNORMAL HIGH (ref 0.5–0.9)
GFR Non-African American: 57 — AB (ref >60)
Globulin: 2.9 g/dL
Glucose: 106 mg/dL (ref 74–109)
Potassium: 4.2 mmol/L (ref 3.5–5.0)
Sodium: 141 mmol/L (ref 136–145)
Total Bilirubin: 0.4 mg/dL (ref 0.2–1.2)
Total Protein: 7.1 g/dL (ref 6.6–8.7)

## 2015-01-09 LAB — CBC WITH AUTO DIFFERENTIAL
Basophils %: 0.6 % (ref 0.0–1.0)
Basophils Absolute: 0.1 10*3/uL (ref 0.00–0.20)
Eosinophils %: 6 % — ABNORMAL HIGH (ref 0.0–5.0)
Eosinophils Absolute: 0.5 10*3/uL (ref 0.00–0.60)
Hematocrit: 45.5 % (ref 37.0–47.0)
Hemoglobin: 14.8 g/dL (ref 12.0–16.0)
Lymphocytes %: 35.2 % (ref 20.0–40.0)
Lymphocytes Absolute: 2.9 10*3/uL (ref 1.1–4.5)
MCH: 28.7 pg (ref 27.0–31.0)
MCHC: 32.5 g/dL — ABNORMAL LOW (ref 33.0–37.0)
MCV: 88.3 fL (ref 81.0–99.0)
MPV: 10.6 fL — ABNORMAL HIGH (ref 7.4–10.4)
Monocytes %: 7.3 % (ref 0.0–10.0)
Monocytes Absolute: 0.6 10*3/uL (ref 0.00–0.90)
Neutrophils %: 50.4 % (ref 50.0–65.0)
Neutrophils Absolute: 4.1 10*3/uL (ref 1.5–7.5)
Platelets: 223 10*3/uL (ref 130–400)
RBC: 5.15 M/uL (ref 4.20–5.40)
RDW: 12.8 % (ref 11.5–14.5)
WBC: 8.1 10*3/uL (ref 4.8–10.8)

## 2015-01-09 LAB — MICROSCOPIC URINALYSIS
Bacteria, UA: NEGATIVE /HPF
Epithelial Cells, UA: 4 /HPF (ref 0–5)
Hyaline Casts, UA: 1 /HPF (ref 0–8)
RBC, UA: 1 /HPF (ref 0–4)
WBC, UA: 6 /HPF — ABNORMAL HIGH (ref 0–5)

## 2015-01-09 LAB — POCT VENOUS
POC Troponin I: 0 ng/mL (ref 0.00–0.08)
POC Troponin I: 0 ng/mL (ref 0.00–0.08)

## 2015-01-09 LAB — RAPID INFLUENZA A/B ANTIGENS
Rapid Influenza A Ag: NEGATIVE
Rapid Influenza B Ag: NEGATIVE

## 2015-01-09 LAB — URINALYSIS
Bilirubin Urine: NEGATIVE
Blood, Urine: NEGATIVE
Glucose, Ur: NEGATIVE mg/dL
Ketones, Urine: NEGATIVE mg/dL
Nitrite, Urine: NEGATIVE
Protein, UA: NEGATIVE mg/dL
Specific Gravity, UA: 1.016 (ref 1.005–1.030)
Urobilinogen, Urine: 0.2 E.U./dL (ref <2.0)
pH, UA: 5.5 (ref 5.0–8.0)

## 2015-01-09 LAB — RAPID STREP SCREEN: Rapid Strep A Screen: NEGATIVE

## 2015-01-09 MED ORDER — IPRATROPIUM-ALBUTEROL 0.5-2.5 (3) MG/3ML IN SOLN
Freq: Once | RESPIRATORY_TRACT | Status: AC
Start: 2015-01-09 — End: 2015-01-09
  Administered 2015-01-09: 16:00:00 1 via RESPIRATORY_TRACT

## 2015-01-09 MED ORDER — METHYLPREDNISOLONE SODIUM SUCC 125 MG IJ SOLR
125 MG | Freq: Once | INTRAMUSCULAR | Status: AC
Start: 2015-01-09 — End: 2015-01-09
  Administered 2015-01-09: 16:00:00 125 mg via INTRAMUSCULAR

## 2015-01-09 MED FILL — IPRATROPIUM-ALBUTEROL 0.5-2.5 (3) MG/3ML IN SOLN: RESPIRATORY_TRACT | Qty: 3

## 2015-01-09 MED FILL — SOLU-MEDROL 125 MG IJ SOLR: 125 MG | INTRAMUSCULAR | Qty: 125

## 2015-01-09 NOTE — ED Provider Notes (Signed)
eMERGENCY dEPARTMENT eNCOUnter      Pt Name: Joyce Cohen  MRN: [id number]  Birthdate 24-Sep-1955  Date of evaluation: 01/09/2015  Provider: Caroleen HammanBrianna J Daltin Crist, PA    CHIEF COMPLAINT       Chief Complaint   Patient presents with   ??? Allergic Reaction         HISTORY OF PRESENT ILLNESS  (Location/Symptom, Timing/Onset, Context/Setting, Quality, Duration, Modifying Factors, Severity.)   Joyce CoBreandan A Cohen is a 59 y.o. female who presents to the emergency department with a possible allergic reaction.  Patient states she was started on Macrobid on Friday for a UTI and over the weekend she started having body aches, chest tightness, and generally not feeling well.  Patient states she feels as if she has the flu.  She denies any rashes.  She denies any abdominal pain no nausea or vomiting.  No recent fevers.  No difficulty swallowing.      Nursing Notes were reviewed and I agree.    REVIEW OF SYSTEMS    (2-9 systems for level 4, 10 or more for level 5)     Review of Systems   Constitutional: Positive for fatigue. Negative for activity change, chills and fever.   HENT: Negative for ear pain, hearing loss, postnasal drip, rhinorrhea, sinus pressure and sore throat.    Eyes: Negative for pain and visual disturbance.   Respiratory: Positive for chest tightness. Negative for apnea, cough, shortness of breath and wheezing.    Cardiovascular: Negative for chest pain.   Gastrointestinal: Negative for abdominal distention, abdominal pain, constipation, diarrhea, nausea and vomiting.   Genitourinary: Negative for difficulty urinating, dysuria, flank pain and hematuria.   Musculoskeletal: Negative for arthralgias and joint swelling.   Skin: Negative for rash.   Neurological: Negative for dizziness, syncope, light-headedness and headaches.   Psychiatric/Behavioral: Negative for agitation and confusion.        Except as noted above the remainder of the review of systems was reviewed and negative.       PAST MEDICAL HISTORY      Past Medical History   Diagnosis Date   ??? Abdominal pain, right lower quadrant    ??? 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 Arthritis    ??? Bipolar disorder (HCC)    ??? Blood circulation, collateral    ??? Cancer (HCC)      skin cancer   ??? Diabetes mellitus (HCC)    ??? Hyperlipidemia    ??? MVP (mitral valve prolapse)    ??? Neuromuscular disorder (HCC) Inguinal Nerve pain   ??? Pleurisy without mention of effusion or current tuberculosis    ??? Right groin pain 02/28/2014   ??? Stomach ulcer      as a child   ??? Unspecified asthma(493.90)    ??? Unspecified constipation    ??? Unspecified hemorrhoids without mention of complication          SURGICAL HISTORY       Past Surgical History   Procedure Laterality Date   ??? Rotator cuff repair Right    ??? Tonsillectomy     ??? Hysterectomy     ??? Appendectomy     ??? Wisdom tooth extraction     ??? Abdominal exploration surgery       times two due to adhesions   ??? Colonoscopy  05/19/11     Dr Renato Gailseed   ??? Colonoscopy  07/15/2012     Dr. Zollie BeckersHendon   ???  Skin biopsy  L and R wrists   ??? Abdomen surgery     ??? Colon surgery  04/2013     1 polyp removed         CURRENT MEDICATIONS       Discharge Medication List as of 01/09/2015 11:59 AM      CONTINUE these medications which have NOT CHANGED    Details   traMADol (ULTRAM) 50 MG tablet Take 1 tablet by mouth every 6 hours as needed for Pain, Disp-45 tablet, R-2      albuterol sulfate HFA 108 (90 BASE) MCG/ACT inhaler Inhale 2 puffs into the lungs 3 times daily      montelukast (SINGULAIR) 10 MG tablet Take 10 mg by mouth nightly      vitamin D (ERGOCALCIFEROL) 50000 UNITS CAPS capsule       ranitidine (ZANTAC) 150 MG tablet       sodium chloride (OCEAN, BABY AYR) 0.65 % nasal spray 1 spray by Nasal route as needed for Congestion, Disp-1 Bottle, R-0      QUEtiapine (SEROQUEL) 200 MG tablet Take 1 tablet by mouth nightly, Disp-30 tablet, R-0      traZODone (DESYREL) 100 MG tablet Take 1 tablet by mouth nightly as needed for Sleep, Disp-30 tablet, R-0       buPROPion SR (WELLBUTRIN SR) 150 MG SR tablet Take 1 tablet by mouth daily, Disp-60 tablet, R-3      OXcarbazepine (TRILEPTAL) 150 MG tablet Take 1 tablet by mouth 2 times daily, Disp-60 tablet, R-0      lubiprostone (AMITIZA) 24 MCG capsule Take 1 capsule by mouth 2 times daily (with meals), Disp-60 capsule, R-11      glipiZIDE (GLUCOTROL) 10 MG tablet Take 1 tablet by mouth every morning (before breakfast), Disp-60 tablet, R-3      Multiple Vitamins-Minerals (THERAPEUTIC MULTIVITAMIN-MINERALS) tablet Take 1 tablet by mouth daily             ALLERGIES     Tobacco [nicotiana tabacum]; Decongestant [pseudoephedrine hcl]; Motrin [ibuprofen]; and Cabbage    FAMILY HISTORY       Family History   Problem Relation Age of Onset   ??? Alcohol Abuse Father    ??? Alcohol Abuse Mother    ??? Mental Illness Mother    ??? Hypertension Mother    ??? Alcohol Abuse Brother    ??? Cancer Paternal Grandfather    ??? Mental Illness Maternal Grandmother    ??? Hypertension Maternal Grandmother    ??? Mental Illness Daughter    ??? Colon Cancer Neg Hx    ??? Colon Polyps Neg Hx    ??? Liver Cancer Neg Hx    ??? Stomach Cancer Neg Hx    ??? Ulcerative Colitis Neg Hx    ??? Crohn's Disease Neg Hx    ??? Liver Disease Neg Hx    ??? Rectal Cancer Neg Hx           SOCIAL HISTORY       Social History     Social History   ??? Marital status: Married     Spouse name: Johnnie   ??? Number of children: 2   ??? Years of education: 8     Social History Main Topics   ??? Smoking status: Never Smoker   ??? Smokeless tobacco: Never Used   ??? Alcohol use No   ??? Drug use: No   ??? Sexual activity: Not Asked     Other Topics Concern   ???  None     Social History Narrative       SCREENINGS           PHYSICAL EXAM    (up to 7 for level 4, 8 or more for level 5)   ED Triage Vitals   BP Temp Temp Source Pulse Resp SpO2 Height Weight   -- 01/09/15 0931 01/09/15 0931 -- -- -- 01/09/15 0931 01/09/15 0931    98.8 ??F (37.1 ??C) Temporal     (1.651 m) 205 lb (93 kg)       Physical Exam    Constitutional: She is oriented to person, place, and time. She appears well-developed and well-nourished. No distress.   HENT:   Head: Normocephalic and atraumatic.   Mouth/Throat: Uvula is midline, oropharynx is clear and moist and mucous membranes are normal. No oropharyngeal exudate, posterior oropharyngeal edema, posterior oropharyngeal erythema or tonsillar abscesses.   Cardiovascular: Normal rate, regular rhythm and normal heart sounds.  Exam reveals no gallop and no friction rub.    No murmur heard.  Pulmonary/Chest: Effort normal and breath sounds normal. No respiratory distress. She has no wheezes. She has no rales.           Abdominal: Soft. Bowel sounds are normal. She exhibits no distension. There is no tenderness. There is no rebound and no guarding.       Neurological: She is alert and oriented to person, place, and time.   Skin: Skin is warm and dry. No rash noted. She is not diaphoretic.   Psychiatric: She has a normal mood and affect.   Nursing note and vitals reviewed.        DIAGNOSTIC RESULTS     RADIOLOGY:   Non-plain film images such as CT, Ultrasound and MRI are read by the radiologist.   Interpretation per the Radiologist below, if available at the time of this note:    XR Chest Standard TWO VW   Final Result   No active disease is seen.       Dictated on 01/09/2015 11:41 AM EST. Signed by Dr Freddy Finner on   01/09/2015 11:42 AM EST   Signed by Dr Freddy Finner  on 01/09/2015 10:42          LABS:  Labs Reviewed   CBC WITH AUTO DIFFERENTIAL - Abnormal; Notable for the following:        Result Value    MCHC 32.5 (*)     MPV 10.6 (*)     Eosinophils Relative Percent 6.0 (*)     All other components within normal limits   COMPREHENSIVE METABOLIC PANEL - Abnormal; Notable for the following:     CREATININE 1.0 (*)     GFR Non-African American 57 (*)     All other components within normal limits   URINALYSIS - Abnormal; Notable for the following:     Leukocyte Esterase, Urine SMALL (*)     All  other components within normal limits   MICROSCOPIC URINALYSIS - Abnormal; Notable for the following:     WBC, UA 6 (*)     All other components within normal limits   RAPID STREP SCREEN   RAPID INFLUENZA A/B ANTIGENS   URINE CULTURE   CULTURE BETA STREP CONFIRM PLATE   POCT TROPONIN   POCT VENOUS   POCT VENOUS   POCT TROPONIN       All other labs were within normal range or not returned as of this dictation.  EMERGENCY DEPARTMENT COURSE and DIFFERENTIAL DIAGNOSIS/MDM:   Vitals:    Vitals:    01/09/15 0931 01/09/15 1248   BP:  133/69   Pulse:  87   Resp:  18   Temp: 98.8 ??F (37.1 ??C)    TempSrc: Temporal    SpO2:  95%   Weight: 205 lb (93 kg)    Height:  (1.651 m)            MDM  Number of Diagnoses or Management Options  Diagnosis management comments: Discussed patient's case with Dr. Jones Broom who interpret EKG, normal sinus rhythm.  Patient's workup has been negative for any evidence of an acute cardiac event.  Repeat troponins are negative.  Patient is feeling much better since receiving a DuoNeb treatment here in the ED.  I advised her to stop taking Macrobid due to the possible side effects that she is feeling.  Patient does not have strep or flu.  Chest x-ray was negative for any acute active disease.  Laboratory workup has been unremarkable for any evidence of a bacterial infection.  Patient received a Solu-Medrol shot IM here in the ED.  I encouraged her to make a follow-up appointment with her primary care provider this week.  She was also encouraged return to the ED if she develops any chest pain shortness of breath or change in her symptoms.  No acute distress stable and ready for discharge.      PROCEDURES:    Procedures      FINAL IMPRESSION      1. Allergic reaction caused by a drug          DISPOSITION/PLAN   DISPOSITION     Patient was told that if symptoms worsen or new symptoms develop they are to return to the emergency department immediately. Patient was educated on diagnosis and treatment  plan.  All of patient's questions were answered, and the patient understands the discharge plan. I do not feel the patient has a life-threatening condition at this time.  Patient is to be discharged.       PATIENT REFERRED TO:  Sharmon Leyden, MD  [address]  [place] [address]  [phone]    Schedule an appointment as soon as possible for a visit      [facility]  [address]  [place] [address]  [phone]    As needed, If symptoms worsen      DISCHARGE MEDICATIONS:  Discharge Medication List as of 01/09/2015 11:59 AM          (Please note that portions of this note were completed with a voice recognition program.  Efforts were made to edit the dictations but occasionally words are mis-transcribed.)    Caroleen Hamman, PA       [employer], [place]  01/09/15 1720

## 2015-01-09 NOTE — Discharge Instructions (Signed)
Allergic Reaction: Care Instructions  Your Care Instructions  An allergic reaction is an excessive response from your immune system to a medicine, chemical, food, insect bite, or other substance. A reaction can range from mild to life-threatening. Some people have a mild rash, hives, and itching or stomach cramps. In severe reactions, swelling of your tongue and throat can close up your airway so that you cannot breathe.  Follow-up care is a key part of your treatment and safety. Be sure to make and go to all appointments, and call your doctor if you are having problems. It's also a good idea to know your test results and keep a list of the medicines you take.  How can you care for yourself at home?   If you know what caused your allergic reaction, be sure to avoid it. Your allergy may become more severe each time you have a reaction.   Take an over-the-counter antihistamine, such as cetirizine (Zyrtec) or loratadine (Claritin), to treat mild symptoms. Read and follow directions on the label. Some antihistamines can make you feel sleepy. Do not give antihistamines to a child unless you have checked with your doctor first. Mild symptoms include sneezing or an itchy or runny nose; an itchy mouth; a few hives or mild itching; and mild nausea or stomach discomfort.   Do not scratch hives or a rash. Put a cold, moist towel on them or take cool baths to relieve itching. Put ice packs on hives, swelling, or insect stings for 10 to 15 minutes at a time. Put a thin cloth between the ice pack and your skin. Do not take hot baths or showers. They will make the itching worse.   Your doctor may prescribe a shot of epinephrine to carry with you in case you have a severe reaction. Learn how to give yourself the shot and keep it with you at all times. Make sure it is not expired.   Go to the emergency room every time you have a severe reaction, even if you have used your shot of epinephrine and are feeling better. Symptoms  can come back after a shot.   Wear medical alert jewelry that lists your allergies. You can buy this at most drugstores.   If your child has a severe allergy, make sure that his or her teachers, babysitters, coaches, and other caregivers know about the allergy. They should have an epinephrine shot, know how and when to give it, and have a plan to take your child to the hospital.  When should you call for help?  Give an epinephrine shot if:   You think you are having a severe allergic reaction.   You have symptoms in more than one body area, such as mild nausea and an itchy mouth.  After giving an epinephrine shot call 911, even if you feel better.  Call 911 if:   You have symptoms of a severe allergic reaction. These may include:   Sudden raised, red areas (hives) all over your body.   Swelling of the throat, mouth, lips, or tongue.   Trouble breathing.   Passing out (losing consciousness). Or you may feel very lightheaded or suddenly feel weak, confused, or restless.   You have been given an epinephrine shot, even if you feel better.  Call your doctor now or seek immediate medical care if:   You have symptoms of an allergic reaction, such as:   A rash or hives (raised, red areas on the skin).   Itching.     Swelling.   Belly pain, nausea, or vomiting.  Watch closely for changes in your health, and be sure to contact your doctor if:   You do not get better as expected.  Where can you learn more?  Go to [email or web address] and sign in to your MyChart account. Enter E081 in the Search Health Information box to learn more about "Allergic Reaction: Care Instructions."    If you do not have an account, please click on the "Sign Up Now" link.   2006-2016 [employer]. Care instructions adapted under license by [facility]. This care instruction is for use with your licensed healthcare professional. If you have questions about a medical condition or this instruction, always  ask your healthcare professional. [employer] Inc[employer] disclaims any warranty or liability for your use of this information.  Content Version: 11.0.578772; Current as of: April 01, 2014

## 2015-01-09 NOTE — ED Notes (Signed)
RT at bedside     Blinda Leatherwoodlice M Rowley, RN  01/09/15 [phone]

## 2015-01-11 LAB — CULTURE BETA STREP CONFIRM PLATE: Strep A Culture: NORMAL

## 2015-01-11 LAB — CULTURE, URINE: Urine Culture, Routine: <50000

## 2015-01-12 LAB — EKG 12-LEAD
P Axis: 13 degrees
P Axis: 22 degrees
P-R Interval: 166 ms
P-R Interval: 176 ms
Q-T Interval: 382 ms
Q-T Interval: 394 ms
QRS Duration: 102 ms
QRS Duration: 104 ms
QTc Calculation (Bazett): 425 ms
QTc Calculation (Bazett): 430 ms
T Axis: 18 degrees
T Axis: 26 degrees

## 2015-01-16 ENCOUNTER — Inpatient Hospital Stay: Admit: 2015-01-16 | Payer: BLUE CROSS/BLUE SHIELD | Primary: Emergency Medicine

## 2015-01-16 ENCOUNTER — Encounter

## 2015-01-16 DIAGNOSIS — R1013 Epigastric pain: Secondary | ICD-10-CM

## 2015-01-16 MED ORDER — TECHNETIUM TC 99M MEBROFENIN
Freq: Once | Status: AC | PRN
Start: 2015-01-16 — End: 2015-01-16
  Administered 2015-01-16: 19:00:00 5 via INTRAVENOUS

## 2015-01-16 MED ORDER — SINCALIDE 5 MCG IJ SOLR
5 MCG | Freq: Once | INTRAMUSCULAR | Status: AC
Start: 2015-01-16 — End: 2015-01-16
  Administered 2015-01-16: 18:00:00 2 ug via INTRAVENOUS

## 2015-01-26 ENCOUNTER — Emergency Department: Admit: 2015-01-27 | Payer: BLUE CROSS/BLUE SHIELD | Primary: Emergency Medicine

## 2015-01-26 DIAGNOSIS — K8050 Calculus of bile duct without cholangitis or cholecystitis without obstruction: Secondary | ICD-10-CM

## 2015-01-26 NOTE — ED Provider Notes (Signed)
[facility]  eMERGENCY dEPARTMENT eNCOUnter      Pt Name: Joyce Cohen  MRN: [id number]  Birthdate 11/01/1955  Date of evaluation: 01/26/2015  Provider: Salome Holmes, MD    CHIEF COMPLAINT       Chief Complaint   Patient presents with   ??? Abdominal Pain     had [place] last week here and told gallbladder was bad         HISTORY OF PRESENT ILLNESS   (Location/Symptom, Timing/Onset, Context/Setting, Quality, Duration, Modifying Factors, Severity)  Note limiting factors.   Joyce Cohen is a 59 y.o. female who presents to the emergency department complaining of epigastric and right upper quadrant pain. Patient says she's been having similar symptoms off and on for the past 5 months. Has been evaluated for this and had recent HIDA scan that showed poorly functioning gallbladder. Ultrasound was done and showed no evidence of gallstones. Said that she developed pain this evening similar to pain she's had before but pain has not resolved tonight. Pain in the epigastric and right upper quadrant region radiating to her back. Nausea. No vomiting. Has had loose stools. No fevers. No trouble breathing. No history of cardiac or pulmonary disease. No flank pain or dysuria or hematuria. Has not yet been referred to a surgeon by her family doctor. Tells me she is not on anything for pain control for her biliary colic.     HPI    Nursing Notes were reviewed.    REVIEW OF SYSTEMS    (2-9 systems for level 4, 10 or more for level 5)     Review of Systems   Constitutional: Negative for fever.   Eyes: Negative for pain.   Respiratory: Negative for shortness of breath.    Cardiovascular: Negative for chest pain and palpitations.   Gastrointestinal: Positive for abdominal pain, diarrhea and nausea. Negative for vomiting.   Genitourinary: Negative for dysuria.   Skin: Negative for rash.   Neurological: Negative for weakness and headaches.   All other systems reviewed and are negative.      A complete review of systems  was performed and is negative except as noted above in the HPI.       PAST MEDICAL HISTORY     Past Medical History   Diagnosis Date   ??? Abdominal pain, right lower quadrant    ??? 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 Arthritis    ??? Bipolar disorder (HCC)    ??? Blood circulation, collateral    ??? Cancer (HCC)      skin cancer   ??? Diabetes mellitus (HCC)    ??? Hyperlipidemia    ??? MVP (mitral valve prolapse)    ??? Neuromuscular disorder (HCC) Inguinal Nerve pain   ??? Pleurisy without mention of effusion or current tuberculosis    ??? Right groin pain 02/28/2014   ??? Stomach ulcer      as a child   ??? Unspecified asthma(493.90)    ??? Unspecified constipation    ??? Unspecified hemorrhoids without mention of complication          SURGICAL HISTORY       Past Surgical History   Procedure Laterality Date   ??? Rotator cuff repair Right    ??? Tonsillectomy     ??? Hysterectomy     ??? Appendectomy     ??? Wisdom tooth extraction     ??? Abdominal exploration surgery  times two due to adhesions   ??? Colonoscopy  05/19/11     Dr Renato Gails   ??? Colonoscopy  07/15/2012     Dr. Zollie Beckers   ??? Skin biopsy  L and R wrists   ??? Abdomen surgery     ??? Colon surgery  04/2013     1 polyp removed         CURRENT MEDICATIONS       Previous Medications    ALBUTEROL SULFATE HFA 108 (90 BASE) MCG/ACT INHALER    Inhale 2 puffs into the lungs 3 times daily    BUPROPION SR (WELLBUTRIN SR) 150 MG SR TABLET    Take 1 tablet by mouth daily    GLIPIZIDE (GLUCOTROL) 10 MG TABLET    Take 1 tablet by mouth every morning (before breakfast)    LUBIPROSTONE (AMITIZA) 24 MCG CAPSULE    Take 1 capsule by mouth 2 times daily (with meals)    MONTELUKAST (SINGULAIR) 10 MG TABLET    Take 10 mg by mouth nightly    OXCARBAZEPINE (TRILEPTAL) 150 MG TABLET    Take 1 tablet by mouth 2 times daily    RANITIDINE (ZANTAC) 150 MG TABLET        TRAZODONE (DESYREL) 100 MG TABLET    Take 1 tablet by mouth nightly as needed for Sleep    VITAMIN D (ERGOCALCIFEROL) 50000 UNITS CAPS CAPSULE            ALLERGIES     Tobacco [nicotiana tabacum]; Decongestant [pseudoephedrine hcl]; Motrin [ibuprofen]; and Cabbage    FAMILY HISTORY       Family History   Problem Relation Age of Onset   ??? Alcohol Abuse Father    ??? Alcohol Abuse Mother    ??? Mental Illness Mother    ??? Hypertension Mother    ??? Alcohol Abuse Brother    ??? Cancer Paternal Grandfather    ??? Mental Illness Maternal Grandmother    ??? Hypertension Maternal Grandmother    ??? Mental Illness Daughter    ??? Colon Cancer Neg Hx    ??? Colon Polyps Neg Hx    ??? Liver Cancer Neg Hx    ??? Stomach Cancer Neg Hx    ??? Ulcerative Colitis Neg Hx    ??? Crohn's Disease Neg Hx    ??? Liver Disease Neg Hx    ??? Rectal Cancer Neg Hx           SOCIAL HISTORY       Social History     Social History   ??? Marital status: Married     Spouse name: Johnnie   ??? Number of children: 2   ??? Years of education: 65     Social History Main Topics   ??? Smoking status: Never Smoker   ??? Smokeless tobacco: Never Used   ??? Alcohol use No   ??? Drug use: No   ??? Sexual activity: Not Asked     Other Topics Concern   ??? None     Social History Narrative       SCREENINGS             PHYSICAL EXAM    (up to 7 for level 4, 8 or more for level 5)   ED Triage Vitals   BP Temp Temp Source Pulse Resp SpO2 Height Weight   01/26/15 2004 01/26/15 2004 01/26/15 2004 01/26/15 2004 01/26/15 2004 01/26/15 2004 01/26/15 2004 01/26/15 2004   143/65 97.9 ??F (  36.6 ??C) Oral 93 20 97 %  (1.651 m) 200 lb (90.7 kg)       Physical Exam   Constitutional: She is oriented to person, place, and time. She appears well-developed. No distress.   HENT:   Head: Normocephalic and atraumatic.   Eyes: Pupils are equal, round, and reactive to light. No scleral icterus.   Neck: Normal range of motion. Neck supple. No JVD present.   Cardiovascular: Normal rate, regular rhythm, normal heart sounds and intact distal pulses.    Pulmonary/Chest: Effort normal and breath sounds normal. No respiratory distress.   Abdominal: Soft. She exhibits no  distension and no pulsatile midline mass. There is tenderness (mild) in the right upper quadrant and epigastric area. There is no rigidity, no rebound, no guarding, no tenderness at McBurney's point and negative Murphy's sign.   Musculoskeletal: She exhibits no edema or tenderness.   Neurological: She is alert and oriented to person, place, and time.   Skin: Skin is warm and dry.   Psychiatric: She has a normal mood and affect. Her behavior is normal.   Vitals reviewed.      DIAGNOSTIC RESULTS     EKG: All EKG's are interpreted by the Emergency Department Physician who either signs or Co-signs this chart in the absence of a cardiologist.    Normal sinus rhythm. Normal QT interval. No evidence of acute ischemia.    Sinus rhythm. Normal QT interval. No evidence of acute ischemia.    RADIOLOGY:   Non-plain film images such as CT, Ultrasound and MRI are read by the radiologist. Plain radiographic images are visualized and preliminarily interpreted by the emergency physician with the below findings:    Interpretation per the Radiologist below, if available at the time of this note:    XR Acute Abd Series Chest 1 VW   Final Result      1. Mild bibasilar atelectasis. The lungs are otherwise clear.   2. Mild constipation with an otherwise normal bowel gas pattern..      Dictated on 01/26/2015 10:00 PM EST. Signed by Dr Vergie Living on   01/26/2015 10:02 PM EST   Signed by Dr Vergie Living  on 01/26/2015 21:02            ED BEDSIDE ULTRASOUND:   Performed by ED Physician - none    LABS:  Labs Reviewed   CBC - Abnormal; Notable for the following:        Result Value    MPV 10.6 (*)     All other components within normal limits   COMPREHENSIVE METABOLIC PANEL - Abnormal; Notable for the following:     Glucose 110 (*)     CREATININE 1.1 (*)     GFR Non-African American 51 (*)     All other components within normal limits   LIPASE - Abnormal; Notable for the following:     Lipase 69 (*)     All other components within normal  limits   POCT TROPONIN   POCT VENOUS   POCT TROPONIN       All other labs were within normal range or not returned as of this dictation.    EMERGENCY DEPARTMENT COURSE and DIFFERENTIAL DIAGNOSIS/MDM:   Vitals:    Vitals:    01/26/15 2004 01/26/15 2100   BP: (!) 143/65 138/78   Pulse: 93 91   Resp: 20 20   Temp: 97.9 ??F (36.6 ??C)    TempSrc: Oral    SpO2:  97%    Weight: 200 lb (90.7 kg)    Height: 5\' 5"  (1.651 m)        MDM  Bobette Moasper #[id number]    Patient resting comfortably. Nontoxic on exam. Symptoms improved. Symptoms today are very similar to symptoms she has been having for several months and likely related to biliary colic. We'll DC with prescription for pain medications and refer her to general surgery for further evaluation for biliary colic. Told to return to the ER for change or worsening symptoms or new concerns. Patient agreeable with plan.    CONSULTS:  None    PROCEDURES:  Unless otherwise noted below, none     Procedures    FINAL IMPRESSION      1. Biliary colic          DISPOSITION/PLAN   DISPOSITION     PATIENT REFERRED TO:  [facility]  [address]  [place] [id number]  [phone]    As needed, If symptoms worsen    Gabriel RainwaterLindsey J Barnes, MD  [address]  [address]  [place] [id number]  [phone]    Schedule an appointment as soon as possible for a visit      Sharmon LeydenJohn W Brazzell, MD  [address]  [place] [id number]  [phone]    Schedule an appointment as soon as possible for a visit        DISCHARGE MEDICATIONS:  New Prescriptions    HYDROCODONE-ACETAMINOPHEN (NORCO) 5-325 MG PER TABLET    Take 1 tablet by mouth every 6 hours as needed for Pain .    ONDANSETRON (ZOFRAN ODT) 4 MG DISINTEGRATING TABLET    Take 1 tablet by mouth every 8 hours as needed for Nausea or Vomiting     Attestation: The Prescription Monitoring Report for this patient was reviewed today. Salome Holmes(Ivannah Zody C Lonni Dirden, MD)    (Please note that portions of this note were completed with a voice recognition program.   Efforts were made to edit the dictations but occasionally words are mis-transcribed.)    Salome HolmesJonathan C Carston Riedl, MD (electronically signed)  Attending Emergency Physician       Salome HolmesJonathan C Mauria Asquith, MD  01/26/15 2233

## 2015-01-26 NOTE — Discharge Instructions (Signed)
Biliary Colic: Care Instructions  Your Care Instructions     Biliary (say "BILL-ee-air-ee") colic is belly pain caused by gallbladder problems. It is usually caused by a gallstone moving through or blocking the common bile duct or cystic duct.  Gallstones are stones that form in the gallbladder. They are made of cholesterol and other substances. The gallbladder is a small sac located just under the liver. It stores bile released by the liver. Bile helps you digest fats.  Gallstones also can form in the common bile duct or cystic duct. These ducts carry bile from the gallbladder and the liver to the small intestine. Gallstones may be as small as a grain of sand or as large as a golf ball.  Gallstones that cause severe symptoms usually are treated with surgery to remove the gallbladder. If the first attack of biliary colic is mild, it is often safe to wait until you have had another attack before you think about having surgery.  The doctor has checked you carefully, but problems can develop later. If you notice any problems or new symptoms, get medical treatment right away.  Follow-up care is a key part of your treatment and safety. Be sure to make and go to all appointments, and call your doctor if you are having problems. It's also a good idea to know your test results and keep a list of the medicines you take.  How can you care for yourself at home?   Take pain medicines exactly as directed.   If the doctor gave you a prescription medicine for pain, take it as prescribed.   If you are not taking a prescription pain medicine, ask your doctor if you can take an over-the-counter medicine. Read and follow all instructions on the label.   Avoid foods that cause symptoms, especially fatty foods. These can cause biliary colic.   You may need more tests to look at your gallbladder.  When should you call for help?  Call your doctor now or seek immediate medical care if:   You have a fever.   You have new belly  pain, or your pain gets worse.   There is a new or increasing yellow tint to your skin or the whites of your eyes.   Your urine is dark yellow-brown, or your stools are light-colored or white.   You cannot keep down fluids.  Watch closely for changes in your health, and be sure to contact your doctor if:   You do not get better as expected.   You are not getting better after 1 day (24 hours).  Where can you learn more?  Go to [email or web address] and sign in to your MyChart account. Enter [phone] in the Search Health Information box to learn more about "Biliary Colic: Care Instructions."    If you do not have an account, please click on the "Sign Up Now" link.   2006-2016 [employer]. Care instructions adapted under license by [facility]. This care instruction is for use with your licensed healthcare professional. If you have questions about a medical condition or this instruction, always ask your healthcare professional. [employer], Inc[employer] disclaims any warranty or liability for your use of this information.  Content Version: 11.0.578772; Current as of: January 07, 2014

## 2015-01-27 ENCOUNTER — Inpatient Hospital Stay
Admit: 2015-01-27 | Discharge: 2015-01-27 | Disposition: A | Discharge status: Home / Self Care | Payer: BLUE CROSS/BLUE SHIELD | Attending: Emergency Medicine

## 2015-01-27 LAB — CBC
Hematocrit: 39.6 % (ref 37.0–47.0)
Hemoglobin: 13.5 g/dL (ref 12.0–16.0)
MCH: 29 pg (ref 27.0–31.0)
MCHC: 34.1 g/dL (ref 33.0–37.0)
MCV: 85.2 fL (ref 81.0–99.0)
MPV: 10.6 fL — ABNORMAL HIGH (ref 7.4–10.4)
Platelets: 203 10*3/uL (ref 130–400)
RBC: 4.65 M/uL (ref 4.20–5.40)
RDW: 12.5 % (ref 11.5–14.5)
WBC: 10.2 10*3/uL (ref 4.8–10.8)

## 2015-01-27 LAB — COMPREHENSIVE METABOLIC PANEL
ALT: 20 U/L (ref 5–33)
AST: 10 U/L (ref 5–32)
Albumin: 4 g/dL (ref 3.5–5.2)
Alkaline Phosphatase: 71 U/L (ref 35–104)
Anion Gap: 15 mmol/L (ref 7–19)
BUN: 13 mg/dL (ref 6–20)
CO2: 23 mmol/L (ref 22–29)
Calcium: 9.6 mg/dL (ref 8.6–10.0)
Chloride: 106 mmol/L (ref 98–111)
Creatinine: 1.1 mg/dL — ABNORMAL HIGH (ref 0.5–0.9)
GFR Non-African American: 51 — AB (ref >60)
Globulin: 2.6 g/dL
Glucose: 110 mg/dL — ABNORMAL HIGH (ref 74–109)
Potassium: 4.1 mmol/L (ref 3.5–5.0)
Sodium: 144 mmol/L (ref 136–145)
Total Bilirubin: 0.4 mg/dL (ref 0.2–1.2)
Total Protein: 6.6 g/dL (ref 6.6–8.7)

## 2015-01-27 LAB — POCT VENOUS
POC Troponin I: 0 ng/mL (ref 0.00–0.08)
POC Troponin I: 0 ng/mL (ref 0.00–0.08)

## 2015-01-27 LAB — LIPASE: Lipase: 69 U/L — ABNORMAL HIGH (ref 13–60)

## 2015-01-27 MED ORDER — ONDANSETRON HCL 4 MG/2ML IJ SOLN
4 MG/2ML | Freq: Once | INTRAMUSCULAR | Status: AC
Start: 2015-01-27 — End: 2015-01-26
  Administered 2015-01-27: 02:00:00 4 mg via INTRAVENOUS

## 2015-01-27 MED ORDER — HYDROCODONE-ACETAMINOPHEN 5-325 MG PO TABS
5-325 MG | ORAL_TABLET | Freq: Four times a day (QID) | ORAL | 0 refills | Status: AC | PRN
Start: 2015-01-27 — End: 2015-02-02

## 2015-01-27 MED ORDER — HYDROMORPHONE HCL 1 MG/ML IJ SOLN
1 MG/ML | Freq: Once | INTRAMUSCULAR | Status: AC
Start: 2015-01-27 — End: 2015-01-26
  Administered 2015-01-27: 02:00:00 1 mg via INTRAVENOUS

## 2015-01-27 MED ORDER — ONDANSETRON 4 MG PO TBDP
4 MG | ORAL_TABLET | Freq: Three times a day (TID) | ORAL | 0 refills | Status: DC | PRN
Start: 2015-01-27 — End: 2015-03-16

## 2015-01-27 MED FILL — ONDANSETRON HCL 4 MG/2ML IJ SOLN: 4 MG/2ML | INTRAMUSCULAR | Qty: 2

## 2015-01-27 MED FILL — HYDROMORPHONE HCL 1 MG/ML IJ SOLN: 1 MG/ML | INTRAMUSCULAR | Qty: 1

## 2015-01-31 LAB — EKG 12-LEAD
P Axis: -2 degrees
P Axis: -8 degrees
P-R Interval: 160 ms
P-R Interval: 170 ms
Q-T Interval: 394 ms
Q-T Interval: 394 ms
QRS Duration: 102 ms
QRS Duration: 104 ms
QTc Calculation (Bazett): 425 ms
QTc Calculation (Bazett): 430 ms
T Axis: 24 degrees
T Axis: 34 degrees

## 2015-02-15 ENCOUNTER — Ambulatory Visit
Admit: 2015-02-15 | Discharge: 2015-02-15 | Payer: BLUE CROSS/BLUE SHIELD | Attending: Surgery | Primary: Emergency Medicine

## 2015-02-15 ENCOUNTER — Inpatient Hospital Stay: Admit: 2015-02-15 | Payer: BLUE CROSS/BLUE SHIELD | Primary: Emergency Medicine

## 2015-02-15 DIAGNOSIS — K828 Other specified diseases of gallbladder: Secondary | ICD-10-CM

## 2015-02-15 DIAGNOSIS — R079 Chest pain, unspecified: Secondary | ICD-10-CM

## 2015-02-15 LAB — ECHOCARDIOGRAM EXERCISE STRESS TEST: Left Ventricular Ejection Fraction: 50

## 2015-02-15 NOTE — Progress Notes (Signed)
Subjective:      Patient ID: Joyce Cohen is a 59 y.o. female.    HPI   Joyce Cohen is a 59 year old female who presents with complaint of epigastric and retrosternal pain. She reports that this pain has been occurring for a couple of years. It radiates to her back and her right shoulder. It is a dull ache most of the time, and sometimes sharp. She hasn't noticed it being related to or worsened by anything like food or exercise. Nothing makes the pain better. Greasy and fatty foods cause nausea. She takes ranitidine for heartburn, but recently this has not been working as well. She had a colonoscopy about a year ago with no significant findings. She had a HIDA scan which showed a decreased EF but did not make her feel sick.      Past Medical History   Diagnosis Date   . Abdominal pain, right lower quadrant    . 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 Arthritis    . Bipolar disorder (HCC)    . Blood circulation, collateral    . Cancer (HCC)      skin cancer   . Diabetes mellitus (HCC)    . Hearing aid worn      bilateral   . Hyperlipidemia    . MVP (mitral valve prolapse)    . Neuromuscular disorder (HCC) Inguinal Nerve pain   . Pleurisy without mention of effusion or current tuberculosis    . Right groin pain 02/28/2014   . Stomach ulcer      as a child   . Unspecified asthma(493.90)    . Unspecified constipation    . Unspecified hemorrhoids without mention of complication      Past Surgical History   Procedure Laterality Date   . Rotator cuff repair Right    . Tonsillectomy     . Hysterectomy     . Appendectomy     . Wisdom tooth extraction     . Abdominal exploration surgery       times two due to adhesions   . Colonoscopy  05/19/11     Dr Renato Gails   . Colonoscopy  07/15/2012     Dr. Zollie Beckers   . Skin biopsy  L and R wrists   . Abdomen surgery     . Colon surgery  04/2013     1 polyp removed   . Tubal ligation       Current Outpatient Prescriptions   Medication Sig Dispense Refill   . QUEtiapine  (SEROQUEL) 400 MG tablet Take 400 mg by mouth 2 times daily     . traMADol (ULTRAM) 50 MG tablet Take 50 mg by mouth every 6 hours as needed for Pain     . carbidopa-levodopa (SINEMET) 10-100 MG per tablet Take 1 tablet by mouth 3 times daily     . ondansetron (ZOFRAN ODT) 4 MG disintegrating tablet Take 1 tablet by mouth every 8 hours as needed for Nausea or Vomiting 10 tablet 0   . albuterol sulfate HFA 108 (90 BASE) MCG/ACT inhaler Inhale 2 puffs into the lungs 3 times daily     . montelukast (SINGULAIR) 10 MG tablet Take 10 mg by mouth nightly     . vitamin D (ERGOCALCIFEROL) 50000 UNITS CAPS capsule      . ranitidine (ZANTAC) 150 MG tablet      . traZODone (DESYREL) 100 MG tablet Take 1  tablet by mouth nightly as needed for Sleep (Patient taking differently: Take 300 mg by mouth nightly ) 30 tablet 0   . buPROPion SR (WELLBUTRIN SR) 150 MG SR tablet Take 1 tablet by mouth daily 60 tablet 3   . OXcarbazepine (TRILEPTAL) 150 MG tablet Take 1 tablet by mouth 2 times daily 60 tablet 0   . lubiprostone (AMITIZA) 24 MCG capsule Take 1 capsule by mouth 2 times daily (with meals) 60 capsule 11   . glipiZIDE (GLUCOTROL) 10 MG tablet Take 1 tablet by mouth every morning (before breakfast) 60 tablet 3     No current facility-administered medications for this visit.      Allergies: Tobacco [nicotiana tabacum]; Decongestant [pseudoephedrine hcl]; Motrin [ibuprofen]; and Cabbage  Family History   Problem Relation Age of Onset   . Alcohol Abuse Father    . Mental Illness Father    . Alcohol Abuse Mother    . Mental Illness Mother    . Hypertension Mother    . Alcohol Abuse Brother    . Cancer Paternal Grandfather    . Mental Illness Maternal Grandmother    . Hypertension Maternal Grandmother    . Mental Illness Daughter    . Colon Cancer Neg Hx    . Colon Polyps Neg Hx    . Liver Cancer Neg Hx    . Stomach Cancer Neg Hx    . Ulcerative Colitis Neg Hx    . Crohn's Disease Neg Hx    . Liver Disease Neg Hx    . Rectal Cancer Neg  Hx      Social History   Substance Use Topics   . Smoking status: Never Smoker   . Smokeless tobacco: Never Used   . Alcohol use No         Review of Systems   Constitutional: Positive for activity change and fatigue.   HENT: Positive for hearing loss. Negative for sore throat.    Eyes: Negative for pain and visual disturbance.   Respiratory: Positive for chest tightness. Negative for shortness of breath.    Cardiovascular: Negative for chest pain and palpitations.   Gastrointestinal: Positive for diarrhea, nausea and vomiting. Negative for abdominal distention and abdominal pain.   Endocrine: Positive for cold intolerance. Negative for polyuria.   Genitourinary: Positive for enuresis. Negative for hematuria.   Musculoskeletal: Positive for arthralgias and joint swelling.   Skin: Negative for color change and pallor.   Neurological: Negative for dizziness and seizures.   Psychiatric/Behavioral: Positive for decreased concentration. Negative for dysphoric mood. The patient is not nervous/anxious.        Objective:   Physical Exam   Constitutional: She is oriented to person, place, and time. She appears well-developed and well-nourished. No distress.   HENT:   Head: Normocephalic and atraumatic.   Mouth/Throat: No oropharyngeal exudate.   Eyes: EOM are normal. Pupils are equal, round, and reactive to light. No scleral icterus.   Neck: Normal range of motion. No tracheal deviation present. No thyromegaly present.   Cardiovascular: Normal rate and regular rhythm.    No murmur heard.  Pulmonary/Chest: Effort normal and breath sounds normal. No respiratory distress.   Abdominal: Soft. She exhibits no distension. There is tenderness (minimal RUQ).   Musculoskeletal: Normal range of motion. She exhibits no edema.   Neurological: She is alert and oriented to person, place, and time. No cranial nerve deficit.   Skin: Skin is warm and dry. No erythema.   Psychiatric: She  has a normal mood and affect. Her behavior is normal.    Vitals reviewed.      Assessment:      59 year old female with epigastric abdominal pain, as well as nausea and vomiting      Plan:      The patient had a stress test today which went without difficulty. Her HIDA showed a decreased EF. Her pain radiates to the area of her right shoulder, and she has symptoms with greasy and fatty foods. I did discuss with her that I cannot guarantee that this is her gallbladder causing problems, but this does go along with biliary dyskinesia. The risks and benefits of laparoscopic cholecystectomy were discussed with the patient and she expressed understanding, specifically bleeding, infection, injury to surrounding structures, need for open surgery, and post operative diarrhea.

## 2015-02-15 NOTE — Progress Notes (Deleted)
Joyce Cohen is a 59 y.o. female patient.   1. Biliary dyskinesia      Past Medical History   Diagnosis Date   . Abdominal pain, right lower quadrant    . 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 Arthritis    . Bipolar disorder (HCC)    . Blood circulation, collateral    . Cancer (HCC)      skin cancer   . Diabetes mellitus (HCC)    . Hearing aid worn      bilateral   . Hyperlipidemia    . MVP (mitral valve prolapse)    . Neuromuscular disorder (HCC) Inguinal Nerve pain   . Pleurisy without mention of effusion or current tuberculosis    . Right groin pain 02/28/2014   . Stomach ulcer      as a child   . Unspecified asthma(493.90)    . Unspecified constipation    . Unspecified hemorrhoids without mention of complication      Past Surgical History Pertinent Negatives   Procedure Date Noted   . Upper gastrointestinal endoscopy 07/24/2012   . Back surgery 02/28/2014   . Brain surgery 02/28/2014   . Breast surgery 02/28/2014   . Cosmetic surgery 02/28/2014   . Eye surgery 02/28/2014   . Fracture surgery 02/28/2014   . Gastrectomy 02/28/2014   . Cardiac surgery 02/28/2014   . Hernia repair 02/28/2014   . Joint replacement 02/28/2014   . Kidney transplant 02/28/2014   . Mastectomy 02/28/2014   . Vascular surgery 02/28/2014     Scheduled Meds:  Continuous Infusions:  PRN Meds:    Allergies   Allergen Reactions   . Tobacco [Nicotiana Tabacum] Shortness Of Breath   . Decongestant [Pseudoephedrine Hcl]    . Motrin [Ibuprofen]    . Cabbage Nausea And Vomiting     Active Problems:    * No active hospital problems. *    Blood pressure 120/74, temperature 99.8 F (37.7 C), temperature source Temporal, height 5\' 5"  (1.651 m), weight 210 lb 9.6 oz (95.5 kg).    Subjective  Objective  Assessment & Plan    Gabriel Rainwater, MD  02/24/2015

## 2015-02-22 ENCOUNTER — Ambulatory Visit: Primary: Emergency Medicine

## 2015-02-24 ENCOUNTER — Inpatient Hospital Stay: Payer: BLUE CROSS/BLUE SHIELD

## 2015-02-24 LAB — POCT GLUCOSE: POC Glucose: 134 mg/dl — ABNORMAL HIGH (ref 70–99)

## 2015-02-24 MED ORDER — ENALAPRILAT 1.25 MG/ML IV INJ
1.25 MG/ML | Freq: Once | INTRAVENOUS | Status: DC | PRN
Start: 2015-02-24 — End: 2015-02-24

## 2015-02-24 MED ORDER — NORMAL SALINE FLUSH 0.9 % IV SOLN
0.9 % | INTRAVENOUS | Status: DC | PRN
Start: 2015-02-24 — End: 2015-02-24

## 2015-02-24 MED ORDER — LACTATED RINGERS IV SOLN
INTRAVENOUS | Status: DC
Start: 2015-02-24 — End: 2015-02-24
  Administered 2015-02-24: 16:00:00 via INTRAVENOUS

## 2015-02-24 MED ORDER — EPHEDRINE SULFATE 50 MG/ML IJ SOLN
50 MG/ML | INTRAMUSCULAR | Status: DC | PRN
Start: 2015-02-24 — End: 2015-02-24
  Administered 2015-02-24: 17:00:00 10 via INTRAVENOUS

## 2015-02-24 MED ORDER — MORPHINE SULFATE (PF) 4 MG/ML IV SOLN
4 | INTRAVENOUS | Status: DC | PRN
Start: 2015-02-24 — End: 2015-02-24

## 2015-02-24 MED ORDER — FENTANYL WASTE
Status: DC | PRN
Start: 2015-02-24 — End: 2015-02-24
  Administered 2015-02-24: 18:00:00

## 2015-02-24 MED ORDER — MEPERIDINE HCL 25 MG/ML IJ SOLN
25 MG/ML | INTRAMUSCULAR | Status: DC | PRN
Start: 2015-02-24 — End: 2015-02-24

## 2015-02-24 MED ORDER — CEFAZOLIN 2000 MG IN D5W 100 ML IVPB
Status: AC
Start: 2015-02-24 — End: 2015-02-24
  Administered 2015-02-24: 17:00:00 2 g via INTRAVENOUS

## 2015-02-24 MED ORDER — NORMAL SALINE FLUSH 0.9 % IV SOLN
0.9 % | Freq: Two times a day (BID) | INTRAVENOUS | Status: DC
Start: 2015-02-24 — End: 2015-02-24

## 2015-02-24 MED ORDER — LACTATED RINGERS IV SOLN
INTRAVENOUS | Status: DC | PRN
Start: 2015-02-24 — End: 2015-02-24
  Administered 2015-02-24: 17:00:00 via INTRAVENOUS

## 2015-02-24 MED ORDER — GLYCOPYRROLATE 1 MG/5ML IJ SOLN
1 MG/5ML | INTRAMUSCULAR | Status: DC | PRN
Start: 2015-02-24 — End: 2015-02-24
  Administered 2015-02-24: 18:00:00 0.6 via INTRAVENOUS

## 2015-02-24 MED ORDER — ONDANSETRON HCL 4 MG/2ML IJ SOLN
4 MG/2ML | INTRAMUSCULAR | Status: DC | PRN
Start: 2015-02-24 — End: 2015-02-24
  Administered 2015-02-24: 18:00:00 4 via INTRAVENOUS

## 2015-02-24 MED ORDER — FENTANYL CITRATE (PF) 250 MCG/5ML IJ SOLN
250 MCG/5ML | INTRAMUSCULAR | Status: AC
Start: 2015-02-24 — End: ?

## 2015-02-24 MED ORDER — HYDRALAZINE HCL 20 MG/ML IJ SOLN
20 MG/ML | INTRAMUSCULAR | Status: DC | PRN
Start: 2015-02-24 — End: 2015-02-24

## 2015-02-24 MED ORDER — ROCURONIUM BROMIDE 100 MG/10ML IV SOLN
100 MG/10ML | INTRAVENOUS | Status: DC | PRN
Start: 2015-02-24 — End: 2015-02-24
  Administered 2015-02-24: 17:00:00 50 via INTRAVENOUS

## 2015-02-24 MED ORDER — MIDAZOLAM HCL 2 MG/2ML IJ SOLN
2 MG/ML | Freq: Once | INTRAMUSCULAR | Status: AC
Start: 2015-02-24 — End: 2015-02-24
  Administered 2015-02-24: 16:00:00 2 mg via INTRAVENOUS

## 2015-02-24 MED ORDER — LABETALOL HCL 5 MG/ML IV SOLN
5 MG/ML | INTRAVENOUS | Status: DC | PRN
Start: 2015-02-24 — End: 2015-02-24

## 2015-02-24 MED ORDER — BUPIVACAINE-EPINEPHRINE 0.25% -1:200000 IJ SOLN
INTRAMUSCULAR | Status: DC | PRN
Start: 2015-02-24 — End: 2015-02-24
  Administered 2015-02-24: 18:00:00 30

## 2015-02-24 MED ORDER — METOCLOPRAMIDE HCL 5 MG/ML IJ SOLN
5 MG/ML | Freq: Once | INTRAMUSCULAR | Status: AC | PRN
Start: 2015-02-24 — End: 2015-02-24
  Administered 2015-02-24: 19:00:00 10 mg via INTRAVENOUS

## 2015-02-24 MED ORDER — HYDROCODONE-ACETAMINOPHEN 5-325 MG PO TABS
5-325 MG | ORAL_TABLET | ORAL | 0 refills | Status: DC
Start: 2015-02-24 — End: 2015-03-09

## 2015-02-24 MED ORDER — MORPHINE SULFATE (PF) 4 MG/ML IV SOLN
4 MG/ML | INTRAVENOUS | Status: DC | PRN
Start: 2015-02-24 — End: 2015-02-24

## 2015-02-24 MED ORDER — FENTANYL CITRATE (PF) 100 MCG/2ML IJ SOLN
100 MCG/2ML | INTRAMUSCULAR | Status: DC | PRN
Start: 2015-02-24 — End: 2015-02-24
  Administered 2015-02-24: 17:00:00 100 via INTRAVENOUS

## 2015-02-24 MED ORDER — HYDROMORPHONE HCL 1 MG/ML IJ SOLN
1 MG/ML | INTRAMUSCULAR | Status: DC | PRN
Start: 2015-02-24 — End: 2015-02-24

## 2015-02-24 MED ORDER — PROMETHAZINE HCL 25 MG/ML IJ SOLN
25 MG/ML | Freq: Once | INTRAMUSCULAR | Status: DC | PRN
Start: 2015-02-24 — End: 2015-02-24

## 2015-02-24 MED ORDER — PROPOFOL 200 MG/20ML IV EMUL
200 MG/20ML | INTRAVENOUS | Status: DC | PRN
Start: 2015-02-24 — End: 2015-02-24
  Administered 2015-02-24: 17:00:00 200 via INTRAVENOUS

## 2015-02-24 MED ORDER — MIDAZOLAM HCL 2 MG/2ML IJ SOLN
2 MG/ML | INTRAMUSCULAR | Status: DC | PRN
Start: 2015-02-24 — End: 2015-02-24
  Administered 2015-02-24: 17:00:00 2 via INTRAVENOUS

## 2015-02-24 MED ORDER — DIPHENHYDRAMINE HCL 50 MG/ML IJ SOLN
50 MG/ML | Freq: Once | INTRAMUSCULAR | Status: DC | PRN
Start: 2015-02-24 — End: 2015-02-24

## 2015-02-24 MED ORDER — MIDAZOLAM HCL 2 MG/2ML IJ SOLN
2 MG/ML | INTRAMUSCULAR | Status: AC
Start: 2015-02-24 — End: ?

## 2015-02-24 MED ORDER — LIDOCAINE HCL (PF) 1 % IJ SOLN
1 % | Freq: Once | INTRAMUSCULAR | Status: AC
Start: 2015-02-24 — End: 2015-02-24
  Administered 2015-02-24: 16:00:00 1 mL via INTRADERMAL

## 2015-02-24 MED ORDER — NEOSTIGMINE METHYLSULFATE 1 MG/ML IJ SOLN
1 MG/ML | INTRAMUSCULAR | Status: DC | PRN
Start: 2015-02-24 — End: 2015-02-24
  Administered 2015-02-24: 18:00:00 3 via INTRAVENOUS

## 2015-02-24 MED FILL — MIDAZOLAM HCL 2 MG/2ML IJ SOLN: 2 MG/ML | INTRAMUSCULAR | Qty: 2

## 2015-02-24 MED FILL — FENTANYL CITRATE (PF) 250 MCG/5ML IJ SOLN: 250 MCG/5ML | INTRAMUSCULAR | Qty: 5

## 2015-02-24 MED FILL — CEFAZOLIN 2000 MG IN D5W 100 ML IVPB: Qty: 2

## 2015-02-24 MED FILL — METOCLOPRAMIDE HCL 5 MG/ML IJ SOLN: 5 MG/ML | INTRAMUSCULAR | Qty: 2

## 2015-02-24 NOTE — Anesthesia Post-Procedure Evaluation (Signed)
Department of Anesthesiology  Postprocedure Note    Patient: Joyce Cohen  MRN: [id number]  Birthdate: 1955-05-21  Date of evaluation: 02/24/2015  Time:  1:16 PM     Procedure Summary     Date Anesthesia Start Anesthesia Stop Room / Location    02/24/15 1132 1316 [facility] / [facility]       Procedure Diagnosis Surgeon Responsible Provider    CHOLECYSTECTOMY LAPAROSCOPIC (N/A Abdomen) (BILIARY DYSKINESIA ) Gabriel Rainwater, MD Georgiann Hahn, CRNA          Anesthesia Type: general    Aldrete Phase I: Aldrete Score: 10    Aldrete Phase II:      Last vitals:  BP     Temp      Pulse     Resp      SpO2        Anesthesia Post Evaluation    Patient location during evaluation: PACU  Patient participation: complete - patient participated  Level of consciousness: awake and alert  Pain score: 0  Airway patency: patent  Nausea & Vomiting: no nausea and no vomiting  Complications: no  Cardiovascular status: blood pressure returned to baseline  Respiratory status: acceptable  Hydration status: stable

## 2015-02-24 NOTE — Op Note (Signed)
Operative Report    PATIENT NAME: Joyce Cohen     MEDICAL RECORD NO. [id number]  SURGEON: Gabriel RainwaterLindsey J Barnes MD   Primary Care Physician: Unknown Provider Result  Date: 02/24/15 at 1:00 PM      PROCEDURE PERFORMED: Laparoscopic Cholecystectomy  PREOPERATIVE DIAGNOSIS: biliary dyskinesia  POSTOPERATIVE DIAGNOSIS: Same, path pending  SURGEON:  Gabriel RainwaterLindsey J Barnes MD   ANESTHESIA:  General endotracheal anesthesia and local  ESTIMATED BLOOD LOSS: minimal  SPECIMEN:  Gallbladder  COMPLICATIONS:  None; patient tolerated the procedure well.  FINDINGS: no acute findings, scar tissue around umbilicus   DISPOSITION: PACU - hemodynamically stable.  CONDITION: stable      Indications: The patient is a 60 yo female who presented with complaint of epigastric and RUQ pain. She had a decreased EF on HIDA scan.    Procedure Details     After the risks and benefits of the procedure were explained, informed consent was obtained, and the patient was taken to the operating room. The patient was placed in supine position and general anesthesia was begun. The abdomen was prepped and draped in normal sterile fashion. Preoperative antibiotics had been given. A time out was performed.    Local anesthetic was injected and a 5mm supraumbilical incision was made. The base of the umbilicus was grasped and elevated and the verus needle was inserted. The abdomen was insufflated and the 5mm trochar was inserted. There was not immediate return of gas, so the RUQ port was placed using optiview technique. The camera was inserted and three additional ports were placed under direct visualization: an 11 mm subxiphoid port and an additional RUQ and LUQ port was placed. There were adhesions around the umbilicus. These were taken down using scossors and lbunt dissection to expose the area under the umbilical port, with no evidence of port placement injury. The patient was then placed in position head up and rotated slightly to the left.     The tip of the  gallbladder was then grasped and elevated over the edge of the liver. The peritoneum over the neck of the gallbladder was incised using electrocautery. This was extended medially and laterally along the edges of the liver. The cystic artery and cystic duct were then identified and dissected circumferentially using a combination of electrocautery and blunt dissection. Dissection continued along the posterior edge of the liver, onto the cystic plate. Once these two structures were the only two structures identified entering the gallbladder with the liver visible behind them, it was determined that a critical view of safety had been obtained.The cystic artery and cystic duct were then clipped twice proximally and once distally. They were divided using scissors. The gallbladder was then dissected off the liver bed using cautery. The gallbladder was then removed from the abdominal cavity using and endocatch bag. The surgical field was irrigated and suctioned. The clips were inspected and found to be in good position. There was good hemostasis and no evidence of bile leak.     The camera was then inserted through one of the right upper quadrant ports and the umbilical port was inspected. There was no evidence of port placement injury. The epigastric port fascia was then closed using an 0-vicryl and a suture passer. The right upper quadrant ports were removed under direct visualization, there was good hemostasis. The remaining laparoscopic equipment was removed, insufflation was removed, and the skin incisions were closed using 4-0 monocryl subcuticular stitches. Sterile dressings with dermabond were applied.    The  patient tolerated the procedure well, was extubated, and transferred to the recovery area in stable condition.                  Gabriel Rainwater, MD   Electronically signed 02/24/15 at 1:00 PM

## 2015-02-24 NOTE — Anesthesia Pre-Procedure Evaluation (Signed)
Department of Anesthesiology  Preprocedure Note       Name:  Joyce Cohen   Age:  60 y.o.  DOB:  [birth year]                                          MRN:  [id number]         Date:  02/24/2015      Surgeon: Moishe Spice):  Gabriel Rainwater, MD    Procedure: Procedure(s):  CHOLECYSTECTOMY LAPAROSCOPIC    Medications prior to admission:   Prior to Admission medications    Medication Sig Start Date End Date Taking? Authorizing Provider   QUEtiapine (SEROQUEL) 400 MG tablet Take 400 mg by mouth 2 times daily   Yes Historical Provider, MD   traMADol (ULTRAM) 50 MG tablet Take 50 mg by mouth every 6 hours as needed for Pain   Yes Historical Provider, MD   carbidopa-levodopa (SINEMET) 10-100 MG per tablet Take 1 tablet by mouth 3 times daily   Yes Historical Provider, MD   ondansetron (ZOFRAN ODT) 4 MG disintegrating tablet Take 1 tablet by mouth every 8 hours as needed for Nausea or Vomiting 01/26/15  Yes Salome Holmes, MD   albuterol sulfate HFA 108 (90 BASE) MCG/ACT inhaler Inhale 2 puffs into the lungs 3 times daily   Yes Historical Provider, MD   montelukast (SINGULAIR) 10 MG tablet Take 10 mg by mouth nightly   Yes Historical Provider, MD   vitamin D (ERGOCALCIFEROL) 50000 UNITS CAPS capsule  07/13/14  Yes Historical Provider, MD   ranitidine (ZANTAC) 150 MG tablet  07/13/14  Yes Historical Provider, MD   traZODone (DESYREL) 100 MG tablet Take 1 tablet by mouth nightly as needed for Sleep  Patient taking differently: Take 300 mg by mouth nightly  05/16/14  Yes Maylene Roes, MD   buPROPion SR [facility] SR) 150 MG SR tablet Take 1 tablet by mouth daily 05/16/14  Yes Maylene Roes, MD   OXcarbazepine (TRILEPTAL) 150 MG tablet Take 1 tablet by mouth 2 times daily 05/16/14  Yes Maylene Roes, MD   lubiprostone [facility]) 24 MCG capsule Take 1 capsule by mouth 2 times daily (with meals) 03/17/14  Yes Percival Spanish, APRN   glipiZIDE (GLUCOTROL) 10 MG tablet Take 1 tablet by mouth every morning (before breakfast)  02/07/14  Yes Jeanene Erb, DO       Current medications:    Current Facility-Administered Medications   Medication Dose Route Frequency Provider Last Rate Last Dose   . ceFAZolin (ANCEF) 2 g in dextrose 5% 100 mL IVPB  2 g Intravenous On Call to OR Gabriel Rainwater, MD       . sodium chloride flush 0.9 % injection 10 mL  10 mL Intravenous 2 times per day Gabriel Rainwater, MD       . sodium chloride flush 0.9 % injection 10 mL  10 mL Intravenous PRN Gabriel Rainwater, MD           Allergies:    Allergies   Allergen Reactions   . Tobacco [Nicotiana Tabacum] Shortness Of Breath   . Decongestant [Pseudoephedrine Hcl]    . Motrin [Ibuprofen]    . Cabbage Nausea And Vomiting       Problem List:    Patient Active Problem List   Diagnosis Code   . RLQ abdominal pain  R10.31   . Rectal bleeding K62.5   . Constipation K59.00   . Chronic constipation K59.09   . Obesity (BMI 30-39.9) E66.9   . MVP (mitral valve prolapse) I34.1   . Type 2 diabetes mellitus (HCC) E11.9   . Right lumbar radiculopathy M54.16   . Chronic constipation K59.09   . Hypercholesteremia E78.00   . Vitamin D deficiency E55.9   . Suicidal ideations R45.851   . Right groin pain R10.30   . 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 Bipolar disorder current episode depressed (HCC) F31.30   . Suicidal ideation R45.851   . PTSD (post-traumatic stress disorder) F43.10   . 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 Right inguinal pain R10.30   . Lumbar disc disease with radiculopathy M51.16   . Right lumbar radiculopathy M54.16       Past Medical History:        Diagnosis Date   . Abdominal pain, right lower quadrant    . 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 Arthritis    . Bipolar disorder (HCC)    . Blood circulation, collateral    . Cancer (HCC)      skin cancer   . Diabetes mellitus (HCC)    . Hearing aid worn      bilateral   . Hyperlipidemia    . MVP (mitral valve prolapse)    . Neuromuscular disorder (HCC) Inguinal Nerve pain    . Pleurisy without mention of effusion or current tuberculosis    . Right groin pain 02/28/2014   . Stomach ulcer      as a child   . Unspecified asthma(493.90)    . Unspecified constipation    . Unspecified hemorrhoids without mention of complication        Past Surgical History:        Procedure Laterality Date   . Rotator cuff repair Right    . Tonsillectomy     . Hysterectomy     . Appendectomy     . Wisdom tooth extraction     . Abdominal exploration surgery       times two due to adhesions   . Colonoscopy  05/19/11     Dr Renato Gails   . Colonoscopy  07/15/2012     Dr. Zollie Beckers   . Skin biopsy  L and R wrists   . Abdomen surgery     . Colon surgery  04/2013     1 polyp removed   . Tubal ligation         Social History:    Social History   Substance Use Topics   . Smoking status: Never Smoker   . Smokeless tobacco: Never Used   . Alcohol use No                                Counseling given: Not Answered      Vital Signs (Current):   Vitals:    02/22/15 1242 02/24/15 1025   BP:  (!) 149/75   Pulse:  95   Resp:  16   Temp:  97.4 F (36.3 C)   TempSrc:  Temporal   SpO2:  99%   Weight: 210 lb (95.3 kg)  BP Readings from Last 3 Encounters:   02/24/15 (!) 149/75   02/15/15 120/74   01/26/15 (!) 142/84       NPO Status:                                                                                 BMI:   Wt Readings from Last 3 Encounters:   02/22/15 210 lb (95.3 kg)   02/15/15 210 lb 9.6 oz (95.5 kg)   01/26/15 200 lb (90.7 kg)     Body mass index is 34.95 kg/(m^2).    CBC:   Lab Results   Component Value Date    WBC 10.2 01/26/2015    RBC 4.65 01/26/2015    HGB 13.5 01/26/2015    HGB 12.8 07/15/2010    HCT 39.6 01/26/2015    HCT 37.7 07/15/2010    MCV 85.2 01/26/2015    RDW 12.5 01/26/2015    PLT 203 01/26/2015    PLT 265 07/15/2010       CMP:   Lab Results   Component Value Date    NA 144 01/26/2015    NA 145 07/15/2010    K 4.1 01/26/2015    K 4.1 07/15/2010    CL 106  01/26/2015    CL 111 07/15/2010    CO2 23 01/26/2015    BUN 13 01/26/2015    BUN 14 07/15/2010    CREATININE 1.1 01/26/2015    CREATININE 1.3 07/15/2010    LABGLOM 51 01/26/2015    LABGLOM 49 11/30/2013    GLUCOSE 110 01/26/2015    PROT 6.6 01/26/2015    PROT 6.7 07/15/2010    CALCIUM 9.6 01/26/2015    BILITOT 0.4 01/26/2015    ALKPHOS 71 01/26/2015    ALKPHOS 58 07/15/2010    AST 10 01/26/2015    ALT 20 01/26/2015       POC Tests: No results for input(s): POCGLU, POCNA, POCK, POCCL, POCBUN, POCHEMO, POCHCT in the last 72 hours.    Coags: No results found for: PROTIME, INR, APTT    HCG (If Applicable):   Lab Results   Component Value Date    PREGTESTUR NEGATIVE 07/06/2013        ABGs: No results found for: PHART, PO2ART, PCO2ART, HCO3ART, BEART, O2SATART     Type & Screen (If Applicable):  No results found for: LABABO, LABRH    Anesthesia Evaluation  Patient summary reviewed and Nursing notes reviewed history of anesthetic complications (h/o panic on induction and h/o of awareness during closure/stapling):   Airway: Mallampati: I  TM distance: >3 FB   Neck ROM: full  Mouth opening: > = 3 FB Dental: normal exam         Pulmonary:normal exam    (+) asthma:        Cardiovascular:    (+) valvular problems/murmurs: MVP, hyperlipidemia            Beta Blocker:  Not on Beta Blocker   Neuro/Psych:   (+) psychiatric history: stable with treatment      Comments: Bipolar D/o GI/Hepatic/Renal:   (+) GERD:, PUD,          Comments: Born with  1 kidney   Endo/Other:    (+) Type II DM, , : arthritis:.      Abdominal:                    Anesthesia Plan    ASA 3     general     intravenous induction   Anesthetic plan and risks discussed with patient and spouse.    Plan discussed with CRNA.            Aldona Lento, MD   02/24/2015

## 2015-02-24 NOTE — Discharge Instructions (Signed)
Activity as tolerated, but no lifting more than 15 pounds for the first week. No driving while taking pain medications.

## 2015-02-24 NOTE — Discharge Instructions (Signed)
Good nutrition is important when healing from an illness, injury, or surgery. Follow any nutrition recommendations given to you during the hospital stay. If you are instructed to take an oral nutrition supplement at home, you can take it with meals, in-between meals, and/or before bedtime. These supplements can be purchased at most local grocery stores, pharmacies, and chain super-stores. If you need help in covering the cost of your medication or oral supplement, visit [email or web address]://manning.com/. If you have any questions about your diet or nutrition, call the hospital and ask for the dietitian.       Your nutrition orders at the time of discharge were:  Regular, as tolerated.

## 2015-02-24 NOTE — Brief Op Note (Signed)
Brief Postoperative Note  ______________________________________________________________    Patient: Lyanne CoBreandan A Fernholz  Date of Birth: [birth year]  MRN: [id number]  Date of Procedure: 02/24/2015    Pre-Op Diagnosis: BILIARY DYSKINESIA   Post op: same       Procedure(s):  CHOLECYSTECTOMY LAPAROSCOPIC    Anesthesia: General    Surgeon(s):  Gabriel RainwaterLindsey J Barnes, MD    Staff:  First Assistant: Fidel Levyimothy K Thurston, PA-C  Scrub Person First: Zettie CooleyJulie Russell  Scrub Person Second: Danie Binderaitlin McManus     Estimated Blood Loss: * No values recorded between 02/24/2015 11:31 AM and 02/24/2015 12:58 PM *    Complications: None    Specimens:     ID Type Source Tests Collected by Time Destination   A : Gallbladder Tissue Gallbladder SURGICAL PATHOLOGY Gabriel RainwaterLindsey J Barnes, MD 02/24/2015 1200        Implants:  * No implants in log *      Drains:           Findings: adhesions around umbilical port site. LUQ 5 mm port placed to help with take down of adhesions    Gabriel RainwaterLindsey J Barnes, MD  Date: 02/24/2015  Time: 12:58 PM

## 2015-02-24 NOTE — Interval H&P Note (Signed)
H&P reviewed. The patient was examined and there are no changes to the H&P.

## 2015-02-24 NOTE — H&P (Signed)
Subjective:      Patient ID: Joyce Cohen is a 60 y.o. female.    HPI   Joyce Cohen is a 60 year old female who presents with complaint of epigastric and retrosternal pain. She reports that this pain has been occurring for a couple of years. It radiates to her back and her right shoulder. It is a dull ache most of the time, and sometimes sharp. She hasn't noticed it being related to or worsened by anything like food or exercise. Nothing makes the pain better. Greasy and fatty foods cause nausea. She takes ranitidine for heartburn, but recently this has not been working as well. She had a colonoscopy about a year ago with no significant findings. She had a HIDA scan which showed a decreased EF but did not make her feel sick.      Past Medical History   Diagnosis Date   . Abdominal pain, right lower quadrant    . 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 Arthritis    . Bipolar disorder (HCC)    . Blood circulation, collateral    . Cancer (HCC)      skin cancer   . Diabetes mellitus (HCC)    . Hearing aid worn      bilateral   . Hyperlipidemia    . MVP (mitral valve prolapse)    . Neuromuscular disorder (HCC) Inguinal Nerve pain   . Pleurisy without mention of effusion or current tuberculosis    . Right groin pain 02/28/2014   . Stomach ulcer      as a child   . Unspecified asthma(493.90)    . Unspecified constipation    . Unspecified hemorrhoids without mention of complication      Past Surgical History   Procedure Laterality Date   . Rotator cuff repair Right    . Tonsillectomy     . Hysterectomy     . Appendectomy     . Wisdom tooth extraction     . Abdominal exploration surgery       times two due to adhesions   . Colonoscopy  05/19/11     Dr Renato Gails   . Colonoscopy  07/15/2012     Dr. Zollie Beckers   . Skin biopsy  L and R wrists   . Abdomen surgery     . Colon surgery  04/2013     1 polyp removed   . Tubal ligation       Current Outpatient Prescriptions   Medication Sig Dispense Refill   . QUEtiapine  (SEROQUEL) 400 MG tablet Take 400 mg by mouth 2 times daily     . traMADol (ULTRAM) 50 MG tablet Take 50 mg by mouth every 6 hours as needed for Pain     . carbidopa-levodopa (SINEMET) 10-100 MG per tablet Take 1 tablet by mouth 3 times daily     . ondansetron (ZOFRAN ODT) 4 MG disintegrating tablet Take 1 tablet by mouth every 8 hours as needed for Nausea or Vomiting 10 tablet 0   . albuterol sulfate HFA 108 (90 BASE) MCG/ACT inhaler Inhale 2 puffs into the lungs 3 times daily     . montelukast (SINGULAIR) 10 MG tablet Take 10 mg by mouth nightly     . vitamin D (ERGOCALCIFEROL) 50000 UNITS CAPS capsule      . ranitidine (ZANTAC) 150 MG tablet      . traZODone (DESYREL) 100 MG tablet Take 1  tablet by mouth nightly as needed for Sleep (Patient taking differently: Take 300 mg by mouth nightly ) 30 tablet 0   . buPROPion SR (WELLBUTRIN SR) 150 MG SR tablet Take 1 tablet by mouth daily 60 tablet 3   . OXcarbazepine (TRILEPTAL) 150 MG tablet Take 1 tablet by mouth 2 times daily 60 tablet 0   . lubiprostone (AMITIZA) 24 MCG capsule Take 1 capsule by mouth 2 times daily (with meals) 60 capsule 11   . glipiZIDE (GLUCOTROL) 10 MG tablet Take 1 tablet by mouth every morning (before breakfast) 60 tablet 3     No current facility-administered medications for this visit.      Allergies: Tobacco [nicotiana tabacum]; Decongestant [pseudoephedrine hcl]; Motrin [ibuprofen]; and Cabbage  Family History   Problem Relation Age of Onset   . Alcohol Abuse Father    . Mental Illness Father    . Alcohol Abuse Mother    . Mental Illness Mother    . Hypertension Mother    . Alcohol Abuse Brother    . Cancer Paternal Grandfather    . Mental Illness Maternal Grandmother    . Hypertension Maternal Grandmother    . Mental Illness Daughter    . Colon Cancer Neg Hx    . Colon Polyps Neg Hx    . Liver Cancer Neg Hx    . Stomach Cancer Neg Hx    . Ulcerative Colitis Neg Hx    . Crohn's Disease Neg Hx    . Liver Disease Neg Hx    . Rectal Cancer Neg  Hx      Social History   Substance Use Topics   . Smoking status: Never Smoker   . Smokeless tobacco: Never Used   . Alcohol use No         Review of Systems   Constitutional: Positive for activity change and fatigue.   HENT: Positive for hearing loss. Negative for sore throat.    Eyes: Negative for pain and visual disturbance.   Respiratory: Positive for chest tightness. Negative for shortness of breath.    Cardiovascular: Negative for chest pain and palpitations.   Gastrointestinal: Positive for diarrhea, nausea and vomiting. Negative for abdominal distention and abdominal pain.   Endocrine: Positive for cold intolerance. Negative for polyuria.   Genitourinary: Positive for enuresis. Negative for hematuria.   Musculoskeletal: Positive for arthralgias and joint swelling.   Skin: Negative for color change and pallor.   Neurological: Negative for dizziness and seizures.   Psychiatric/Behavioral: Positive for decreased concentration. Negative for dysphoric mood. The patient is not nervous/anxious.        Objective:   Physical Exam   Constitutional: She is oriented to person, place, and time. She appears well-developed and well-nourished. No distress.   HENT:   Head: Normocephalic and atraumatic.   Mouth/Throat: No oropharyngeal exudate.   Eyes: EOM are normal. Pupils are equal, round, and reactive to light. No scleral icterus.   Neck: Normal range of motion. No tracheal deviation present. No thyromegaly present.   Cardiovascular: Normal rate and regular rhythm.    No murmur heard.  Pulmonary/Chest: Effort normal and breath sounds normal. No respiratory distress.   Abdominal: Soft. She exhibits no distension. There is tenderness (minimal RUQ).   Musculoskeletal: Normal range of motion. She exhibits no edema.   Neurological: She is alert and oriented to person, place, and time. No cranial nerve deficit.   Skin: Skin is warm and dry. No erythema.   Psychiatric: She  has a normal mood and affect. Her behavior is normal.    Vitals reviewed.      Assessment:      60 year old female with epigastric abdominal pain, as well as nausea and vomiting      Plan:      The patient had a stress test today which went without difficulty. Her HIDA showed a decreased EF. Her pain radiates to the area of her right shoulder, and she has symptoms with greasy and fatty foods. I did discuss with her that I cannot guarantee that this is her gallbladder causing problems, but this does go along with biliary dyskinesia. The risks and benefits of laparoscopic cholecystectomy were discussed with the patient and she expressed understanding, specifically bleeding, infection, injury to surrounding structures, need for open surgery, and post operative diarrhea.

## 2015-02-24 NOTE — H&P (View-Only) (Signed)
Subjective:      Patient ID: Joyce Cohen is a 60 y.o. female.    HPI   Joyce Cohen is a 60 year old female who presents with complaint of epigastric and retrosternal pain. She reports that this pain has been occurring for a couple of years. It radiates to her back and her right shoulder. It is a dull ache most of the time, and sometimes sharp. She hasn't noticed it being related to or worsened by anything like food or exercise. Nothing makes the pain better. Greasy and fatty foods cause nausea. She takes ranitidine for heartburn, but recently this has not been working as well. She had a colonoscopy about a year ago with no significant findings. She had a HIDA scan which showed a decreased EF but did not make her feel sick.      Past Medical History   Diagnosis Date   . Abdominal pain, right lower quadrant    . 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 Arthritis    . Bipolar disorder (HCC)    . Blood circulation, collateral    . Cancer (HCC)      skin cancer   . Diabetes mellitus (HCC)    . Hearing aid worn      bilateral   . Hyperlipidemia    . MVP (mitral valve prolapse)    . Neuromuscular disorder (HCC) Inguinal Nerve pain   . Pleurisy without mention of effusion or current tuberculosis    . Right groin pain 02/28/2014   . Stomach ulcer      as a child   . Unspecified asthma(493.90)    . Unspecified constipation    . Unspecified hemorrhoids without mention of complication      Past Surgical History   Procedure Laterality Date   . Rotator cuff repair Right    . Tonsillectomy     . Hysterectomy     . Appendectomy     . Wisdom tooth extraction     . Abdominal exploration surgery       times two due to adhesions   . Colonoscopy  05/19/11     Dr Joyce Cohen   . Colonoscopy  07/15/2012     Dr. Zollie Cohen   . Skin biopsy  L and R wrists   . Abdomen surgery     . Colon surgery  04/2013     1 polyp removed   . Tubal ligation       Current Outpatient Prescriptions   Medication Sig Dispense Refill   . QUEtiapine  (SEROQUEL) 400 MG tablet Take 400 mg by mouth 2 times daily     . traMADol (ULTRAM) 50 MG tablet Take 50 mg by mouth every 6 hours as needed for Pain     . carbidopa-levodopa (SINEMET) 10-100 MG per tablet Take 1 tablet by mouth 3 times daily     . ondansetron (ZOFRAN ODT) 4 MG disintegrating tablet Take 1 tablet by mouth every 8 hours as needed for Nausea or Vomiting 10 tablet 0   . albuterol sulfate HFA 108 (90 BASE) MCG/ACT inhaler Inhale 2 puffs into the lungs 3 times daily     . montelukast (SINGULAIR) 10 MG tablet Take 10 mg by mouth nightly     . vitamin D (ERGOCALCIFEROL) 50000 UNITS CAPS capsule      . ranitidine (ZANTAC) 150 MG tablet      . traZODone (DESYREL) 100 MG tablet Take 1  tablet by mouth nightly as needed for Sleep (Patient taking differently: Take 300 mg by mouth nightly ) 30 tablet 0   . buPROPion SR (WELLBUTRIN SR) 150 MG SR tablet Take 1 tablet by mouth daily 60 tablet 3   . OXcarbazepine (TRILEPTAL) 150 MG tablet Take 1 tablet by mouth 2 times daily 60 tablet 0   . lubiprostone (AMITIZA) 24 MCG capsule Take 1 capsule by mouth 2 times daily (with meals) 60 capsule 11   . glipiZIDE (GLUCOTROL) 10 MG tablet Take 1 tablet by mouth every morning (before breakfast) 60 tablet 3     No current facility-administered medications for this visit.      Allergies: Tobacco [nicotiana tabacum]; Decongestant [pseudoephedrine hcl]; Motrin [ibuprofen]; and Cabbage  Family History   Problem Relation Age of Onset   . Alcohol Abuse Father    . Mental Illness Father    . Alcohol Abuse Mother    . Mental Illness Mother    . Hypertension Mother    . Alcohol Abuse Brother    . Cancer Paternal Grandfather    . Mental Illness Maternal Grandmother    . Hypertension Maternal Grandmother    . Mental Illness Daughter    . Colon Cancer Neg Hx    . Colon Polyps Neg Hx    . Liver Cancer Neg Hx    . Stomach Cancer Neg Hx    . Ulcerative Colitis Neg Hx    . Crohn's Disease Neg Hx    . Liver Disease Neg Hx    . Rectal Cancer Neg  Hx      Social History   Substance Use Topics   . Smoking status: Never Smoker   . Smokeless tobacco: Never Used   . Alcohol use No         Review of Systems   Constitutional: Positive for activity change and fatigue.   HENT: Positive for hearing loss. Negative for sore throat.    Eyes: Negative for pain and visual disturbance.   Respiratory: Positive for chest tightness. Negative for shortness of breath.    Cardiovascular: Negative for chest pain and palpitations.   Gastrointestinal: Positive for diarrhea, nausea and vomiting. Negative for abdominal distention and abdominal pain.   Endocrine: Positive for cold intolerance. Negative for polyuria.   Genitourinary: Positive for enuresis. Negative for hematuria.   Musculoskeletal: Positive for arthralgias and joint swelling.   Skin: Negative for color change and pallor.   Neurological: Negative for dizziness and seizures.   Psychiatric/Behavioral: Positive for decreased concentration. Negative for dysphoric mood. The patient is not nervous/anxious.        Objective:   Physical Exam   Constitutional: She is oriented to person, place, and time. She appears well-developed and well-nourished. No distress.   HENT:   Head: Normocephalic and atraumatic.   Mouth/Throat: No oropharyngeal exudate.   Eyes: EOM are normal. Pupils are equal, round, and reactive to light. No scleral icterus.   Neck: Normal range of motion. No tracheal deviation present. No thyromegaly present.   Cardiovascular: Normal rate and regular rhythm.    No murmur heard.  Pulmonary/Chest: Effort normal and breath sounds normal. No respiratory distress.   Abdominal: Soft. She exhibits no distension. There is tenderness (minimal RUQ).   Musculoskeletal: Normal range of motion. She exhibits no edema.   Neurological: She is alert and oriented to person, place, and time. No cranial nerve deficit.   Skin: Skin is warm and dry. No erythema.   Psychiatric: She  has a normal mood and affect. Her behavior is normal.    Vitals reviewed.      Assessment:      60 year old female with epigastric abdominal pain, as well as nausea and vomiting      Plan:      The patient had a stress test today which went without difficulty. Her HIDA showed a decreased EF. Her pain radiates to the area of her right shoulder, and she has symptoms with greasy and fatty foods. I did discuss with her that I cannot guarantee that this is her gallbladder causing problems, but this does go along with biliary dyskinesia. The risks and benefits of laparoscopic cholecystectomy were discussed with the patient and she expressed understanding, specifically bleeding, infection, injury to surrounding structures, need for open surgery, and post operative diarrhea.

## 2015-03-09 ENCOUNTER — Ambulatory Visit
Admit: 2015-03-09 | Discharge: 2015-03-09 | Payer: BLUE CROSS/BLUE SHIELD | Attending: Surgery | Primary: Emergency Medicine

## 2015-03-09 DIAGNOSIS — Z9049 Acquired absence of other specified parts of digestive tract: Secondary | ICD-10-CM

## 2015-03-09 NOTE — Progress Notes (Signed)
Subjective:  Ms. Sewall is here for follow up after laparoscopic cholecystectomy. She reports that she is feeling a lot better, that her pain when she woke up from surgery was better than her pain before surgery. She does report that if she eats anything other than fruits, vegetables, and lean meats, she will get some nausea with bloating and diarrhea.     Objective:  Visit Vitals   . BP 104/74 (Site: Left Arm, Position: Sitting, Cuff Size: Large Adult)   . Temp 99 F (37.2 C) (Temporal)   . Ht 5\' 5"  (1.651 m)   . Wt 205 lb 9.6 oz (93.3 kg)   . BMI 34.21 kg/m2     General: NAD, AAO  Chest: Regular, non-labored  Abdomen: Soft, NT, ND. Incisions healing well    Pathology:  FINAL DIAGNOSIS:    Gallbladder, cholecystectomy:  Chronic cholecystitis with cholesterolosis.    Assessment and plan:  60 year old female s/p laparoscopic cholecystectomy  1) Continue diet and activity as tolerated. I discussed with the patient that some of her eating should continue to improve over the next few weeks. She is not very concerned about this, because she is enjoying eating healthy. I discussed with her that in the future, if this becomes more of an issue, she should give [place] a call and we can discuss prescribing a medication for this. She is happy with this plan  2) She also brought up that she has been feeling some vertigo type feelings. She did have some interaction between her home meds and her pain medication, as well as a history of anesthesia issues-- I discussed with her that this will hopefully improve over the next week or so, but if it does not, she needs to see her PCP to evaluate her current home meds to see if they need to be adjusted again.  3) follow up with general surgery as needed

## 2015-03-16 ENCOUNTER — Inpatient Hospital Stay
Admit: 2015-03-16 | Discharge: 2015-03-17 | Disposition: A | Discharge status: Home / Self Care | Payer: BLUE CROSS/BLUE SHIELD

## 2015-03-16 DIAGNOSIS — S29019A Strain of muscle and tendon of unspecified wall of thorax, initial encounter: Secondary | ICD-10-CM

## 2015-03-16 NOTE — ED Triage Notes (Signed)
Pt had cholecystectomy  On 02/24/15 and reports she "feels like she is developing adhesions in the past week since returning to work". Reports pain in back.

## 2015-03-16 NOTE — ED Provider Notes (Signed)
[facility] EMERGENCY DEPT  eMERGENCY dEPARTMENT eNCOUnter      Pt Name: Joyce Cohen  MRN: [id number]  Birthdate Jul 11, 1955  Date of evaluation: 03/16/2015  Provider: Darrin Luis, APRN    CHIEF COMPLAINT       Chief Complaint   Patient presents with   ??? Back Pain         HISTORY OF PRESENT ILLNESS   (Location/Symptom, Timing/Onset, Context/Setting, Quality, Duration, Modifying Factors, Severity)  Note limiting factors.   Joyce Cohen is a 60 y.o. female who presents to the emergency department with complaints of mid and upper back pain. Pt states that she got her gallbladder out by Dr Zachery Dauer a few weeks ago. She states that she has gone back to work and is working 11 hours at [employer]process [employer]. Pt states that the pain is worse with movement. She has put ben gay on it with relief. She has taken tylenol for pain with some relief. She is requesting something stronger.   She denies any injury. Pt reports she can't take a lot of NSAIDS b/c she only has one kidney.     HPI    Nursing Notes were reviewed.    REVIEW OF SYSTEMS    (2-9 systems for level 4, 10 or more for level 5)     Review of Systems   Constitutional: Negative for activity change, appetite change, fatigue, fever and unexpected weight change.   HENT: Negative for congestion, hearing loss, rhinorrhea, sinus pressure, sore throat and trouble swallowing.    Eyes: Negative for visual disturbance.   Respiratory: Negative for cough and shortness of breath.    Cardiovascular: Negative for chest pain, palpitations and leg swelling.   Gastrointestinal: Negative for abdominal pain, constipation, diarrhea, nausea and vomiting.   Endocrine: Negative for cold intolerance and heat intolerance.   Genitourinary: Negative for flank pain, menstrual problem, pelvic pain, urgency and vaginal discharge.   Musculoskeletal: Positive for back pain (mid/upper). Negative for arthralgias.   Skin: Negative for rash.   Neurological: Negative for headaches.    Psychiatric/Behavioral: Negative for dysphoric mood and sleep disturbance. The patient is not nervous/anxious.        A complete review of systems was performed and is negative except as noted above in the HPI.       PAST MEDICAL HISTORY     Past Medical History   Diagnosis Date   ??? Abdominal pain, right lower quadrant    ??? 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 Arthritis    ??? Bipolar disorder (HCC)    ??? Blood circulation, collateral    ??? Cancer (HCC)      skin cancer   ??? Diabetes mellitus (HCC)    ??? Hearing aid worn      bilateral   ??? Hyperlipidemia    ??? MVP (mitral valve prolapse)    ??? Neuromuscular disorder (HCC) Inguinal Nerve pain   ??? Pleurisy without mention of effusion or current tuberculosis    ??? Right groin pain 02/28/2014   ??? Stomach ulcer      as a child   ??? Unspecified asthma(493.90)    ??? Unspecified constipation    ??? Unspecified hemorrhoids without mention of complication          SURGICAL HISTORY       Past Surgical History   Procedure Laterality Date   ??? Rotator cuff repair Right    ??? Tonsillectomy     ???  Hysterectomy     ??? Appendectomy     ??? Wisdom tooth extraction     ??? Abdominal exploration surgery       times two due to adhesions   ??? Colonoscopy  05/19/11     Dr Renato Gails   ??? Colonoscopy  07/15/2012     Dr. Zollie Beckers   ??? Skin biopsy  L and R wrists   ??? Abdomen surgery     ??? Colon surgery  04/2013     1 polyp removed   ??? Tubal ligation     ??? Cholecystectomy, laparoscopic N/A 02/24/2015     CHOLECYSTECTOMY LAPAROSCOPIC performed by Gabriel Rainwater, [facility] at [facility] OR         CURRENT MEDICATIONS       Previous Medications    ALBUTEROL SULFATE HFA 108 (90 BASE) MCG/ACT INHALER    Inhale 2 puffs into the lungs 3 times daily    BUPROPION SR (WELLBUTRIN SR) 150 MG SR TABLET    Take 1 tablet by mouth daily    CARBIDOPA-LEVODOPA (SINEMET) 10-100 MG PER TABLET    Take 1 tablet by mouth 3 times daily    GLIPIZIDE (GLUCOTROL) 10 MG TABLET    Take 1 tablet by mouth every morning (before breakfast)     LUBIPROSTONE (AMITIZA) 24 MCG CAPSULE    Take 1 capsule by mouth 2 times daily (with meals)    MONTELUKAST (SINGULAIR) 10 MG TABLET    Take 10 mg by mouth nightly    OXCARBAZEPINE (TRILEPTAL) 150 MG TABLET    Take 1 tablet by mouth 2 times daily    QUETIAPINE (SEROQUEL) 400 MG TABLET    Take 400 mg by mouth 2 times daily    RANITIDINE (ZANTAC) 150 MG TABLET        TRAZODONE (DESYREL) 100 MG TABLET    Take 1 tablet by mouth nightly as needed for Sleep    VITAMIN D (ERGOCALCIFEROL) 50000 UNITS CAPS CAPSULE           ALLERGIES     Tobacco [nicotiana tabacum]; Decongestant [pseudoephedrine hcl]; Motrin [ibuprofen]; and Cabbage    FAMILY HISTORY       Family History   Problem Relation Age of Onset   ??? Alcohol Abuse Father    ??? Mental Illness Father    ??? Alcohol Abuse Mother    ??? Mental Illness Mother    ??? Hypertension Mother    ??? Alcohol Abuse Brother    ??? Cancer Paternal Grandfather    ??? Mental Illness Maternal Grandmother    ??? Hypertension Maternal Grandmother    ??? Mental Illness Daughter    ??? Colon Cancer Neg Hx    ??? Colon Polyps Neg Hx    ??? Liver Cancer Neg Hx    ??? Stomach Cancer Neg Hx    ??? Ulcerative Colitis Neg Hx    ??? Crohn's Disease Neg Hx    ??? Liver Disease Neg Hx    ??? Rectal Cancer Neg Hx           SOCIAL HISTORY       Social History     Social History   ??? Marital status: Married     Spouse name: Johnnie   ??? Number of children: 2   ??? Years of education: 78     Social History Main Topics   ??? Smoking status: Never Smoker   ??? Smokeless tobacco: Never Used   ??? Alcohol use No   ???  Drug use: No   ??? Sexual activity: Not Asked     Other Topics Concern   ??? None     Social History Narrative       SCREENINGS             PHYSICAL EXAM    (up to 7 for level 4, 8 or more for level 5)   ED Triage Vitals   BP Temp Temp Source Pulse Resp SpO2 Height Weight   03/16/15 1851 03/16/15 1851 03/16/15 1851 03/16/15 1851 03/16/15 1851 03/16/15 1851 03/16/15 1851 03/16/15 1851   141/90 97.9 ??F (36.6 ??C) Oral 90 16 93 %  (1.651 m)  200 lb (90.7 kg)       Physical Exam   Constitutional: She is oriented to person, place, and time. She appears well-developed and well-nourished.   HENT:   Head: Normocephalic and atraumatic.   Neck: Normal range of motion. Neck supple.   Cardiovascular: Normal rate, regular rhythm, normal heart sounds and intact distal pulses.    Pulmonary/Chest: Effort normal and breath sounds normal.   Abdominal: Soft. Bowel sounds are normal. She exhibits no distension and no mass. There is tenderness (mild ). There is no rebound and no guarding.   Musculoskeletal: Normal range of motion. She exhibits tenderness (muscular tenderness to palpation bilateral trapezius).   Neurological: She is alert and oriented to person, place, and time.   Skin: Skin is warm and dry.   Psychiatric: She has a normal mood and affect. Her behavior is normal. Judgment and thought content normal.       DIAGNOSTIC RESULTS     EKG: All EKG's are interpreted by the Emergency Department Physician who either signs or Co-signs this chart in the absence of a cardiologist.        RADIOLOGY:   Non-plain film images such as CT, Ultrasound and MRI are read by the radiologist. Plain radiographic images are visualized and preliminarily interpreted by the emergency physician with the below findings:        Interpretation per the Radiologist below, if available at the time of this note:    No orders to display         ED BEDSIDE ULTRASOUND:   Performed by ED Physician - none    LABS:  Labs Reviewed - No data to display    All other labs were within normal range or not returned as of this dictation.    REASSESSMENT         EMERGENCY DEPARTMENT COURSE and DIFFERENTIAL DIAGNOSIS/MDM:   Vitals:    Vitals:    03/16/15 1851 03/16/15 2000   BP: (!) 141/90 138/87   Pulse: 90 87   Resp: 16 16   Temp: 97.9 ??F (36.6 ??C)    TempSrc: Oral    SpO2: 93% 94%   Weight: 200 lb (90.7 kg)    Height:  (1.651 m)        MDM      CONSULTS:  None    PROCEDURES:  Unless otherwise noted  below, none     Procedures    FINAL IMPRESSION      1. Thoracic myofascial strain, initial encounter          DISPOSITION/PLAN   DISPOSITION     PATIENT REFERRED TO:  Sharmon Leyden, MD  [address]  [place] [address]  [phone]      As needed      DISCHARGE MEDICATIONS:  New Prescriptions  CYCLOBENZAPRINE (FLEXERIL) 10 MG TABLET    Take 1 tablet by mouth 3 times daily as needed for Muscle spasms     Attestation: The Prescription Monitoring Report for this patient was reviewed today. Glena Norfolk [place], APRN)  Documentation: No signs of potential drug abuse or diversion identified. ([id number]) Darrin Luis, APRN)    (Please note that portions of this note were completed with a voice recognition program.  Efforts were made to edit the dictations but occasionally words are mis-transcribed.)    Darrin Luis, APRN (electronically signed)  Attending Emergency Physician           Darrin Luis, APRN  03/16/15 2006       Glena Norfolk [place], APRN  03/16/15 2008

## 2015-03-17 MED ORDER — CYCLOBENZAPRINE HCL 10 MG PO TABS
10 MG | ORAL_TABLET | Freq: Three times a day (TID) | ORAL | 0 refills | Status: AC | PRN
Start: 2015-03-17 — End: 2015-03-20

## 2015-03-17 MED ORDER — TRAMADOL HCL 50 MG PO TABS
50 MG | ORAL_TABLET | Freq: Three times a day (TID) | ORAL | 0 refills | Status: DC | PRN
Start: 2015-03-17 — End: 2015-06-29

## 2015-03-27 ENCOUNTER — Ambulatory Visit: Admit: 2015-03-27 | Discharge: 2015-03-27 | Payer: BLUE CROSS/BLUE SHIELD | Primary: Emergency Medicine

## 2015-03-27 ENCOUNTER — Inpatient Hospital Stay: Admit: 2015-03-27 | Payer: BLUE CROSS/BLUE SHIELD | Primary: Emergency Medicine

## 2015-03-27 DIAGNOSIS — Z01419 Encounter for gynecological examination (general) (routine) without abnormal findings: Secondary | ICD-10-CM

## 2015-03-27 DIAGNOSIS — Z1231 Encounter for screening mammogram for malignant neoplasm of breast: Secondary | ICD-10-CM

## 2015-03-27 MED ORDER — OSPEMIFENE 60 MG PO TABS
60 MG | ORAL_TABLET | Freq: Every day | ORAL | 3 refills | Status: DC
Start: 2015-03-27 — End: 2015-10-17

## 2015-03-27 NOTE — Progress Notes (Signed)
Joyce Cohen is a 60 y.o. female who presents today for her medical conditions/ complaints as noted below. Joyce Cohen is c/o of Gynecologic Exam and Establish Care        HPI pt denies any complaints.     Last mammogram 4 yrs  Last pap 4 yrs   Contraception hysterectomy   Last bone density 4 yrs   Last colonoscopy 2 yrs ago  No LMP recorded. Patient has had a hysterectomy.  No obstetric history on file.      I have reviewed above HPI and agree.    Past Medical History   Diagnosis Date   . Abdominal pain, right lower quadrant    . 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 Arthritis    . Bipolar disorder (HCC)    . Blood circulation, collateral    . Cancer (HCC)      skin cancer   . Diabetes mellitus (HCC)    . Hearing aid worn      bilateral   . Hyperlipidemia    . MVP (mitral valve prolapse)    . Neuromuscular disorder (HCC) Inguinal Nerve pain   . Pleurisy without mention of effusion or current tuberculosis    . Right groin pain 02/28/2014   . Stomach ulcer      as a child   . Unspecified asthma(493.90)    . Unspecified constipation    . Unspecified hemorrhoids without mention of complication      Past Surgical History   Procedure Laterality Date   . Rotator cuff repair Right    . Tonsillectomy     . Hysterectomy     . Appendectomy     . Wisdom tooth extraction     . Abdominal exploration surgery       times two due to adhesions   . Colonoscopy  05/19/11     Dr Renato Gails   . Colonoscopy  07/15/2012     Dr. Zollie Beckers   . Skin biopsy  L and R wrists   . Abdomen surgery     . Colon surgery  04/2013     1 polyp removed   . Tubal ligation     . Cholecystectomy, laparoscopic N/A 02/24/2015     CHOLECYSTECTOMY LAPAROSCOPIC performed by Gabriel Rainwater, MD at [facility] OR     Family History   Problem Relation Age of Onset   . Alcohol Abuse Father    . Mental Illness Father    . Alcohol Abuse Mother    . Mental Illness Mother    . Hypertension Mother    . Alcohol Abuse Brother    . Cancer Paternal Grandfather     . Mental Illness Maternal Grandmother    . Hypertension Maternal Grandmother    . Mental Illness Daughter    . Colon Cancer Neg Hx    . Colon Polyps Neg Hx    . Liver Cancer Neg Hx    . Stomach Cancer Neg Hx    . Ulcerative Colitis Neg Hx    . Crohn's Disease Neg Hx    . Liver Disease Neg Hx    . Rectal Cancer Neg Hx      Social History   Substance Use Topics   . Smoking status: Never Smoker   . Smokeless tobacco: Never Used   . Alcohol use No       Current Outpatient Prescriptions   Medication Sig Dispense Refill   .  Ospemifene 60 MG TABS Take 1 tablet by mouth daily 90 tablet 3   . traMADol (ULTRAM) 50 MG tablet Take 1 tablet by mouth every 8 hours as needed for Pain 12 tablet 0   . QUEtiapine (SEROQUEL) 400 MG tablet Take 400 mg by mouth 2 times daily     . carbidopa-levodopa (SINEMET) 10-100 MG per tablet Take 1 tablet by mouth 3 times daily     . albuterol sulfate HFA 108 (90 BASE) MCG/ACT inhaler Inhale 2 puffs into the lungs 3 times daily     . montelukast (SINGULAIR) 10 MG tablet Take 10 mg by mouth nightly     . vitamin D (ERGOCALCIFEROL) 50000 UNITS CAPS capsule      . ranitidine (ZANTAC) 150 MG tablet      . traZODone (DESYREL) 100 MG tablet Take 1 tablet by mouth nightly as needed for Sleep (Patient taking differently: Take 300 mg by mouth nightly ) 30 tablet 0   . buPROPion SR (WELLBUTRIN SR) 150 MG SR tablet Take 1 tablet by mouth daily 60 tablet 3   . OXcarbazepine (TRILEPTAL) 150 MG tablet Take 1 tablet by mouth 2 times daily 60 tablet 0   . lubiprostone (AMITIZA) 24 MCG capsule Take 1 capsule by mouth 2 times daily (with meals) 60 capsule 11   . glipiZIDE (GLUCOTROL) 10 MG tablet Take 1 tablet by mouth every morning (before breakfast) 60 tablet 3     No current facility-administered medications for this visit.      Allergies   Allergen Reactions   . Tobacco [Nicotiana Tabacum] Shortness Of Breath   . Decongestant [Pseudoephedrine Hcl]    . Motrin [Ibuprofen]    . Cabbage Nausea And Vomiting      Vitals:    03/27/15 1331   BP: 126/70     Body mass index is 34.28 kg/(m^2).    Review of Systems   Constitutional: Negative.    HENT: Negative.    Eyes: Negative.    Respiratory: Negative.    Cardiovascular: Negative.    Gastrointestinal: Negative.    Genitourinary: Negative.  Negative for dysuria, frequency, menstrual problem, pelvic pain, urgency and vaginal discharge.   Skin: Negative.    Neurological: Negative.    Psychiatric/Behavioral: Negative.          Physical Exam   Constitutional: She is oriented to person, place, and time. She appears well-developed and well-nourished.   HENT:   Head: Normocephalic and atraumatic.   Eyes: Pupils are equal, round, and reactive to light.   Neck: Normal range of motion. Neck supple.   Cardiovascular: Normal rate and regular rhythm.  Exam reveals no gallop and no friction rub.    No murmur heard.  Pulmonary/Chest: Effort normal and breath sounds normal. Right breast exhibits no inverted nipple, no mass, no nipple discharge, no skin change and no tenderness. Left breast exhibits no inverted nipple, no mass, no nipple discharge, no skin change and no tenderness. Breasts are symmetrical.   Abdominal: Soft. Bowel sounds are normal. She exhibits no distension and no mass. There is no tenderness.   Genitourinary: Rectal exam shows no external hemorrhoid and no fissure. No breast swelling, tenderness, discharge or bleeding. There is no rash, tenderness or lesion on the right labia. There is no rash, tenderness or lesion on the left labia. No bleeding in the vagina. No vaginal discharge found.   Genitourinary Comments: Labia are atrophic. Vaginal cuff intact. Specimen for vaginal cuff cytology obtained.  Uterus, cervix  and ovaries surgically absent   Musculoskeletal: Normal range of motion.   Lymphadenopathy:     She has no cervical adenopathy.     She has no axillary adenopathy.   Neurological: She is alert and oriented to person, place, and time. She has normal strength.    Skin: Skin is warm and dry. No rash noted.   Psychiatric: She has a normal mood and affect. Her speech is normal and behavior is normal.       1. Well female exam with routine gynecological exam  PAP SMEAR   2. Screening for vaginal cancer  PAP SMEAR   3. Encounter for screening mammogram for breast cancer  Mammography digital screen bilateral   4. Atrophic vaginitis         MEDICATIONS:  Orders Placed This Encounter   Medications   . Ospemifene 60 MG TABS     Sig: Take 1 tablet by mouth daily     Dispense:  90 tablet     Refill:  3       ORDERS:  Orders Placed This Encounter   Procedures   . Mammography digital screen bilateral   . PAP SMEAR       PLAN:  Patient Discussion:        The importance of self-breast examination was discussed, as well as yearly mammograms after age 53.  Pt to be scheduled.  A well balanced diet and exercise were encouraged.  The risk factors for osteopenia/osteoporosis were discussed along with need for additional calcium and vitamin D.   A colonoscopy is recommended at age 13 unless otherwise indicated based on symptoms or family history.  Pt is up to date.  The risks vs. benefits of HRT were discussed with patient and all questions answered. Rx sent in for Osphena.  Pt has bloodwork with PCP office.  One sample of Osphena provided.

## 2015-03-27 NOTE — Patient Instructions (Signed)
We are committed to providing you with the best care possible.  In order to help us achieve these goals please remember to bring all medications, herbal products, and over the counter supplements with you to each visit.    If your provider has ordered testing for you, please be sure to follow up with our office if you have not received results within 7 days after testing took place.    *If you receive a survey after visiting one of our offices, please take the time to share your experience concerning your physician office visit. These surveys are confidential and no health information about you is shared. We are eager to improve for you and we are counting on your feedback to help make that happen.

## 2015-03-29 ENCOUNTER — Inpatient Hospital Stay
Admit: 2015-03-29 | Discharge: 2015-03-29 | Payer: BLUE CROSS/BLUE SHIELD | Attending: Pain Medicine | Primary: Emergency Medicine

## 2015-03-29 ENCOUNTER — Inpatient Hospital Stay: Admit: 2015-03-29 | Discharge: 2015-03-29 | Payer: BLUE CROSS/BLUE SHIELD | Primary: Emergency Medicine

## 2015-03-29 ENCOUNTER — Encounter

## 2015-03-29 DIAGNOSIS — M5116 Intervertebral disc disorders with radiculopathy, lumbar region: Secondary | ICD-10-CM

## 2015-03-29 DIAGNOSIS — M5136 Other intervertebral disc degeneration, lumbar region: Secondary | ICD-10-CM

## 2015-03-29 MED ORDER — LIDOCAINE-EPINEPHRINE 1 %-1:200000 IJ SOLN
1 percent-:200000 | Freq: Once | INTRAMUSCULAR | Status: AC | PRN
Start: 2015-03-29 — End: 2015-03-29
  Administered 2015-03-29: 18:00:00 2 via EPIDURAL

## 2015-03-29 MED ORDER — BUPIVACAINE HCL (PF) 0.25 % IJ SOLN
0.25 % | INTRAMUSCULAR | Status: AC
Start: 2015-03-29 — End: ?

## 2015-03-29 MED ORDER — BUPIVACAINE HCL (PF) 0.25 % IJ SOLN
0.25 % | Freq: Once | INTRAMUSCULAR | Status: AC | PRN
Start: 2015-03-29 — End: 2015-03-29
  Administered 2015-03-29: 18:00:00 5 via EPIDURAL

## 2015-03-29 MED ORDER — METHYLPREDNISOLONE ACETATE 80 MG/ML IJ SUSP
80 MG/ML | INTRAMUSCULAR | Status: AC
Start: 2015-03-29 — End: ?

## 2015-03-29 MED ORDER — LIDOCAINE HCL (PF) 1 % IJ SOLN
1 % | Freq: Once | INTRAMUSCULAR | Status: AC | PRN
Start: 2015-03-29 — End: 2015-03-29
  Administered 2015-03-29: 18:00:00 3 via SUBCUTANEOUS

## 2015-03-29 MED ORDER — IOHEXOL 240 MG/ML IJ SOLN
240 MG/ML | Freq: Once | INTRAMUSCULAR | Status: AC | PRN
Start: 2015-03-29 — End: 2015-03-29
  Administered 2015-03-29: 18:00:00 2 via EPIDURAL

## 2015-03-29 MED ORDER — SODIUM CHLORIDE 0.9 % IJ SOLN
0.9 % | INTRAMUSCULAR | Status: AC
Start: 2015-03-29 — End: ?

## 2015-03-29 MED ORDER — METHYLPREDNISOLONE ACETATE 80 MG/ML IJ SUSP
80 MG/ML | Freq: Once | INTRAMUSCULAR | Status: AC | PRN
Start: 2015-03-29 — End: 2015-03-29
  Administered 2015-03-29: 18:00:00 80 via EPIDURAL

## 2015-03-29 MED ORDER — SODIUM CHLORIDE 0.9 % IJ SOLN
0.9 % | Freq: Once | INTRAMUSCULAR | Status: AC | PRN
Start: 2015-03-29 — End: 2015-03-29
  Administered 2015-03-29: 18:00:00 4

## 2015-03-29 MED FILL — SENSORCAINE-MPF 0.25 % IJ SOLN: 0.25 % | INTRAMUSCULAR | Qty: 10

## 2015-03-29 MED FILL — DEPO-MEDROL 80 MG/ML IJ SUSP: 80 MG/ML | INTRAMUSCULAR | Qty: 1

## 2015-03-29 MED FILL — SODIUM CHLORIDE 0.9 % IJ SOLN: 0.9 % | INTRAMUSCULAR | Qty: 10

## 2015-03-29 NOTE — Discharge Instructions (Signed)
Dr. Claudie Revering  Discharge Instructions / Information        You have had the procedure(s) called:    []  Cervical Epidural     [x]  Lumbar Epidural     []  _____________Injection  []  Cervical Facets  []  Lumbar Facets []  Radiofrequency Lesioning  []  Occipital Nerve Blocks []  Caudal Epidural []  Transforaminal Epidural  []  Trigger Point Injections [x]  SI Joint Injection []  ______________________    Please follow these instructions carefully.      If you have questions or problems you may call [phone].     You have received the following medications:  [x]  Lidocaine [x]  Bupivacaine: []  0.50%   [x]  0.25% [x]  DepoMedrol   [x]  Normal Saline  [x]  Omnipaque  []  Versed []  Sodium Bicarbonate    []  Botox  []  Kenalog   []  Diprivan  []  _____________________    PATIENT INFORMATION:  You may experience the following symptoms after your procedure.  These symptoms are normal and should not cause alarm.    *An increase in our pain may occur.  This may last 24-48 hours after your procedure.  *No change in pain.  *Weakness or numbness in your affected extremity.  This will usually only last a few hours.    REMEMBER TO REPORT THE FOLLOWING SYMPTOMS TO YOUR DOCTOR:  *Redness, swelling. Or drainage at the injection site.    *Unusual pain that interferes with your usual activities of daily living.    OTHER INSTRUCTIONS:  [x]  I will apply ice to the injection site for 24 hours.  Apply 15-20 minutes and take off 40 minutes.  [x]  I understand if a steroid was used it will take effect in 3-5 days.  [x]  I have received my personal belongings and valuable.  [x]  I have received a copy of my discharge instructions and understand to my satisfaction.  [x]  I will not drive for []  12 hours    [x]  24 hours after my injections.  [x]  I understand that today I will  [x]  rest  []  walk and move freely  []  other   []  Additional instructions:            Patient Discharge:  [x]  Home  []  Hospital  []  Other     How:  []  Ambulatory  [x]  Wheelchair   []   Stretcher   []  Other      Accompanied by:  [x]  Family Member  []  Friend  []  Hospital Staff  []  Ambulance Staff    []  Other

## 2015-03-29 NOTE — Progress Notes (Signed)
Nursing Admission Record    Current Issues / Falls / ER Visits:  Yes lap gallbladder dec 2016    Percentage of Pain Relief after Last Procedure:  100 %    How long lasted:  3 months    Radiology exams received during the last 12 months: Yes       When dec                                              Where [facility]       Imaging on chart: Yes         Imaging records requested: No  MRI exams received in the past 2 years:  No  Physical therapy during the last 6 months: No       When: na                                             Where  na  Labs during the last 12 months: Yes    Education Provided:  [x]  Review of [place]  []  Agreement Review  []  Compliance Issues Discussed    []  Cognitive Behavior Needs [x]  Exercise []  Review of Test []  Financial Issues  []  Tobacco/Alcohol Use [x]  Teaching []  New Patient []  Picture Obtained    Physician Plan:  []  Outgoing Referral  []  Pharmacy Consult  []  Test Ordered   []  Obtained Test Results / Consult Notes  []  UDS due at next visit, verified per EPIC      []  Suspected Physical Abuse or Suicide Risk assessed - IF YES COMPLETE QUESTIONS BELOW    If any of the following questions are answered yes - contact attending physician for referral:    Has been considering harming self to escape stress, pain problems?  []  YES  [x]  NO  Has a suicide plan? []  YES  [x]  NO  Has attempted suicide in the past?   []  YES  [x]  NO  Has a close friend or family member who committed suicide?  []  YES  [x]  NO    Patient Referred To :     Additional Notes:    Assessment Completed by:  Electronically signed by Laneta Simmers, RN on 03/29/2015 at 12:29 PM

## 2015-03-29 NOTE — Procedures (Signed)
DATE: 03/29/2015      REASON FOR VISIT: Lumbar Degenerative Disease      PROCEDURE: Lumbar Epidural Steroid Injection.   []  Moderate Sedation    DESCRIPTION OF PROCEDURE:              After obtaining informed consent, the patient was taken to the procedure room, positioned prone, and sterilely prepped.  The procedure was performed under fluoroscopic guidance.  First 5 ml of 1% Xylocaine was used at T12 - L1 for local anesthesia.  A 19-gauge Hustead needle was advanced to the epidural space.  The was confirmed on the fluoroscope with an injection of [x]  2ml  []  ml Contrast.  Then, [x]  5ml of 0.25% Marcaine, [x]  4ml of Normal Saline, and [x]  80 mg of Depo Medrol was gently injected.        There were no complications.    DIAGNOSES:  []  Low Back Pain  [x]  *Lumbar Radiculopathy  []  *Lumbar Degenerative Disc Disease  []  *Lumbar Spinal Stenosis  []  *Lumbar Postlaminectomy Syndrome  []  Other      Procedure:  Level of Consciousness: [x] Alert [x] Oriented [] Disoriented [] Lethargic  Anxiety Level: [x] Calm [] Anxious [] Depressed [] Other  Skin: [x] Warm [x] Dry [] Cool [] Moist [] Intact [] Other  Cardiovascular: [] Palpitations: [x] Never [] Occasionally [] Frequently  Chest Pain: [x] No [] Yes  Respiratory: [x] Unlabored [] Labored [] Cough ([]  Productive [] Unproductive)  HCG Required: [x] No [] Yes Results: [] Negative [] Positive  Knowledge Level:  [x] Patient/Other verbalized understanding of pre-procedure instructions.  [x] Assessment of post-op care needs (transportation, responsible caregiver)  [x] Able to discuss health care problems and how to deal with it.  Factors that Affect Teaching:  Language Barrier: [x] No [] Yes - why:  Hearing Loss: [x] No [] Yes Corrective Device: [] Yes [] No  Vision Loss: [] No [x] Yes Corrective Device: [x] Yes [] No  Memory Loss: [x] No [] Yes [] Short Term [] Long Term  Motivational Level:  [x] Asks Questions [] Extremely Anxious   [x] Seems Interested [] Seems Uninterested   [x] Denies need for Education  Risk for  Injury:  [x] Patient oriented to person, place and time  [] History of frequent falls/loss of balance  Nutritional:  [] Change in appetite [] Weight Gain [] Weight Loss  Functional:  Nursing Admission Record    Current Issues / Falls / ER Visits: Yes lap gallbladder dec 2016  Percentage of Pain Relief after Last Procedure: 100 % How long lasted: 3 months  Radiology exams received during the last 12 months: Yes  When dec Where [facility]  Imaging on chart: Yes   Imaging records requested: No  MRI exams received in the past 2 years: No  Physical therapy during the last 6 months: No  When: na Where na  Labs during the last 12 months: Yes      PLAN:  [x]  Will return to office in  3 month(s)for  [x]  Planned Procedure  []  Office Visit  []  Prescriptions were given today   [x]  No prescriptions needed today  [x]  Patient is to call with any questions or concerns which may arise prior to the next office visit.    COMMENTS:  Pt c/o low back pain that radiates around to the abdominal area.  Pain score today is 3/10.  Patient feels that he gained 100% relief that lasted 3 months from last injections.  Will continue with the lumbar epidural steroid injection today.  Discussed risks and complications with the patient.     She is working, [employer]. Gets a lot of overtime. Mood very good.                     [  x] Over 50% of today's appointment was given to discussion, evaluation and counseling.

## 2015-03-29 NOTE — Progress Notes (Signed)
Procedure:  Level of Consciousness: [x]Alert [x]Oriented []Disoriented []Lethargic  Anxiety Level: [x]Calm []Anxious []Depressed []Other  Skin: [x]Warm [x]Dry []Cool []Moist []Intact []Other  Cardiovascular: []Palpitations: [x]Never []Occasionally []Frequently  Chest Pain: [x]No []Yes  Respiratory:  [x]Unlabored []Labored []Cough ([] Productive []Unproductive)  HCG Required: [x]No []Yes   Results: []Negative []Positive  Knowledge Level:        [x]Patient/Other verbalized understanding of pre-procedure instructions.        [x]Assessment of post-op care needs (transportation, responsible caregiver)        [x]Able to discuss health care problems and how to deal with it.  Factors that Affect Teaching:        Language Barrier: [x]No []Yes - why:        Hearing Loss:        [x]No []Yes            Corrective Device:  []Yes []No        Vision Loss:           []No [x]Yes            Corrective Device:  [x]Yes []No        Memory Loss:       [x]No []Yes            []Short Term []Long Term  Motivational Level:  [x]Asks Questions                  []Extremely Anxious       [x]Seems Interested               []Seems Uninterested                  [x]Denies need for Education  Risk for Injury:  [x]Patient oriented to person, place and time  []History of frequent falls/loss of balance  Nutritional:  []Change in appetite   []Weight Gain   []Weight Loss  Functional:  []Requires assistance with ADL's

## 2015-04-06 ENCOUNTER — Ambulatory Visit: Payer: BLUE CROSS/BLUE SHIELD | Primary: Emergency Medicine

## 2015-04-06 ENCOUNTER — Inpatient Hospital Stay: Payer: BLUE CROSS/BLUE SHIELD | Primary: Emergency Medicine

## 2015-04-09 ENCOUNTER — Emergency Department: Admit: 2015-04-09 | Payer: BLUE CROSS/BLUE SHIELD | Primary: Emergency Medicine

## 2015-04-09 ENCOUNTER — Inpatient Hospital Stay
Admit: 2015-04-09 | Discharge: 2015-04-09 | Disposition: A | Discharge status: Home / Self Care | Payer: BLUE CROSS/BLUE SHIELD

## 2015-04-09 DIAGNOSIS — J40 Bronchitis, not specified as acute or chronic: Secondary | ICD-10-CM

## 2015-04-09 MED ORDER — ALBUTEROL SULFATE HFA 108 (90 BASE) MCG/ACT IN AERS
108 (90 Base) MCG/ACT | RESPIRATORY_TRACT | 0 refills | Status: DC | PRN
Start: 2015-04-09 — End: 2015-10-11

## 2015-04-09 MED ORDER — BENZONATATE 200 MG PO CAPS
200 MG | ORAL_CAPSULE | Freq: Three times a day (TID) | ORAL | 0 refills | Status: DC | PRN
Start: 2015-04-09 — End: 2015-09-07

## 2015-04-09 NOTE — ED Provider Notes (Signed)
[facility] EMERGENCY DEPT  eMERGENCY dEPARTMENT eNCOUnter      Pt Name: Joyce Cohen  MRN: [id number]  Birthdate 1955-08-05  Date of evaluation: 04/09/2015  Provider: Lyndee Hensen, APRN    CHIEF COMPLAINT       Chief Complaint   Patient presents with   ??? Cough   ??? Nasal Congestion         HISTORY OF PRESENT ILLNESS   (Location/Symptom, Timing/Onset, Context/Setting, Quality, Duration, Modifying Factors, Severity)  Note limiting factors.   Joyce Cohen is a 60 y.o. female who presents to the emergency department for evaluation of cough. Pt tells me that she has had cough and congestion over past 5 days. She was evaluated by her pcp 3 days ago and started augmentin for sinus infection. She tells me that she has had increased cough today associated with episode of vomiting earlier. She has had subjective fever. She tells me that she has had wheezing at times. She gives history of prior asthma. She has had no respiratory difficulties. She does tell me that she is out of her inhaler.   HPI    Nursing Notes were reviewed.    REVIEW OF SYSTEMS    (2-9 systems for level 4, 10 or more for level 5)     Review of Systems   Constitutional: Positive for fever.   Respiratory: Positive for choking and wheezing.    Cardiovascular: Negative.        A complete review of systems was performed and is negative except as noted above in the HPI.       PAST MEDICAL HISTORY     Past Medical History   Diagnosis Date   ??? Abdominal pain, right lower quadrant    ??? 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 Arthritis    ??? Bipolar disorder (HCC)    ??? Blood circulation, collateral    ??? Cancer (HCC)      skin cancer   ??? Diabetes mellitus (HCC)    ??? Hearing aid worn      bilateral   ??? Hyperlipidemia    ??? MVP (mitral valve prolapse)    ??? Neuromuscular disorder (HCC) Inguinal Nerve pain   ??? Pleurisy without mention of effusion or current tuberculosis    ??? Right groin pain 02/28/2014   ??? Stomach ulcer      as a child   ??? Unspecified  asthma(493.90)    ??? Unspecified constipation    ??? Unspecified hemorrhoids without mention of complication          SURGICAL HISTORY       Past Surgical History   Procedure Laterality Date   ??? Rotator cuff repair Right    ??? Tonsillectomy     ??? Hysterectomy     ??? Appendectomy     ??? Wisdom tooth extraction     ??? Abdominal exploration surgery       times two due to adhesions   ??? Colonoscopy  05/19/11     Dr Renato Gails   ??? Colonoscopy  07/15/2012     Dr. Zollie Beckers   ??? Skin biopsy  L and R wrists   ??? Abdomen surgery     ??? Colon surgery  04/2013     1 polyp removed   ??? Tubal ligation     ??? Cholecystectomy, laparoscopic N/A 02/24/2015     CHOLECYSTECTOMY LAPAROSCOPIC performed by Gabriel Rainwater, [facility] at [facility] OR   ???  Cholecystectomy, laparoscopic           CURRENT MEDICATIONS       Discharge Medication List as of 04/09/2015 10:41 AM      CONTINUE these medications which have NOT CHANGED    Details   amoxicillin-clavulanate (AUGMENTIN) 500-125 MG per tablet Take 1 tablet by mouth 2 times daily      Ospemifene 60 MG TABS Take 1 tablet by mouth daily, Disp-90 tablet, R-3      traMADol (ULTRAM) 50 MG tablet Take 1 tablet by mouth every 8 hours as needed for Pain, Disp-12 tablet, R-0      QUEtiapine (SEROQUEL) 400 MG tablet Take 400 mg by mouth 2 times daily      carbidopa-levodopa (SINEMET) 10-100 MG per tablet Take 1 tablet by mouth 3 times daily      !! albuterol sulfate HFA 108 (90 BASE) MCG/ACT inhaler Inhale 2 puffs into the lungs 3 times daily      montelukast (SINGULAIR) 10 MG tablet Take 10 mg by mouth nightly      vitamin D (ERGOCALCIFEROL) 50000 UNITS CAPS capsule       ranitidine (ZANTAC) 150 MG tablet       traZODone (DESYREL) 100 MG tablet Take 1 tablet by mouth nightly as needed for Sleep, Disp-30 tablet, R-0      buPROPion SR (WELLBUTRIN SR) 150 MG SR tablet Take 1 tablet by mouth daily, Disp-60 tablet, R-3      OXcarbazepine (TRILEPTAL) 150 MG tablet Take 1 tablet by mouth 2 times daily, Disp-60 tablet, R-0      lubiprostone  (AMITIZA) 24 MCG capsule Take 1 capsule by mouth 2 times daily (with meals), Disp-60 capsule, R-11      glipiZIDE (GLUCOTROL) 10 MG tablet Take 1 tablet by mouth every morning (before breakfast), Disp-60 tablet, R-3       !! - Potential duplicate medications found. Please discuss with provider.          ALLERGIES     Tobacco [nicotiana tabacum]; Decongestant [pseudoephedrine hcl]; Motrin [ibuprofen]; and Cabbage    FAMILY HISTORY       Family History   Problem Relation Age of Onset   ??? Alcohol Abuse Father    ??? Mental Illness Father    ??? Alcohol Abuse Mother    ??? Mental Illness Mother    ??? Hypertension Mother    ??? Alcohol Abuse Brother    ??? Cancer Paternal Grandfather    ??? Mental Illness Maternal Grandmother    ??? Hypertension Maternal Grandmother    ??? Mental Illness Daughter    ??? Colon Cancer Neg Hx    ??? Colon Polyps Neg Hx    ??? Liver Cancer Neg Hx    ??? Stomach Cancer Neg Hx    ??? Ulcerative Colitis Neg Hx    ??? Crohn's Disease Neg Hx    ??? Liver Disease Neg Hx    ??? Rectal Cancer Neg Hx           SOCIAL HISTORY       Social History     Social History   ??? Marital status: Married     Spouse name: Johnnie   ??? Number of children: 2   ??? Years of education: 66     Social History Main Topics   ??? Smoking status: Never Smoker   ??? Smokeless tobacco: Never Used   ??? Alcohol use No   ??? Drug use: No   ??? Sexual activity:  Not Asked     Other Topics Concern   ??? None     Social History Narrative       SCREENINGS             PHYSICAL EXAM    (up to 7 for level 4, 8 or more for level 5)   ED Triage Vitals   BP Temp Temp src Pulse Resp SpO2 Height Weight   -- -- -- -- -- -- 04/09/15 0840 04/09/15 0840         5\' 5"  (1.651 m) 206 lb (93.4 kg)     Vitals:    04/09/15 0840 04/09/15 0842 04/09/15 1100   BP:  136/83 138/86   Pulse:  101 98   Resp:  22 20   Temp:  99.1 ??F (37.3 ??C)    SpO2:  96% 96%   Weight: 206 lb (93.4 kg)     Height: 5\' 5"  (1.651 m)           Physical Exam   Constitutional: She is oriented to person, place, and time. She  appears well-nourished.   HENT:   Head: Normocephalic.   Right Ear: External ear normal.   Left Ear: External ear normal.   Mouth/Throat: Oropharynx is clear and moist.   Eyes: Conjunctivae and EOM are normal. Pupils are equal, round, and reactive to light.   Neck: Normal range of motion.   Cardiovascular: Normal rate, regular rhythm, normal heart sounds and intact distal pulses.    Pulmonary/Chest: Effort normal and breath sounds normal.   Abdominal: Soft. Bowel sounds are normal.   Musculoskeletal: Normal range of motion.   Neurological: She is alert and oriented to person, place, and time.   Skin: Skin is warm and dry.       DIAGNOSTIC RESULTS     EKG: All EKG's are interpreted by the Emergency Department Physician who either signs or Co-signs this chart in the absence of a cardiologist.        RADIOLOGY:   Non-plain film images such as CT, Ultrasound and MRI are read by the radiologist. Plain radiographic images are visualized and preliminarily interpreted by the emergency physician with the below findings:        Interpretation per the Radiologist below, if available at the time of this note:    XR Chest Standard TWO VW   Final Result   1. Linear basilar atelectasis versus scarring. No dense consolidation.   Borderline hyperinflated lungs..      Dictated on 04/09/2015 10:46 AM EST. Signed by Dr Angelique Holm on   04/09/2015 10:47 AM EST   Signed by Dr Angelique Holm  on 04/09/2015 09:47            ED BEDSIDE ULTRASOUND:   Performed by ED Physician - none    LABS:  Labs Reviewed - No data to display    All other labs were within normal range or not returned as of this dictation.    RE-ASSESSMENT           EMERGENCY DEPARTMENT COURSE and DIFFERENTIAL DIAGNOSIS/MDM:   Vitals:    Vitals:    04/09/15 0840 04/09/15 0842 04/09/15 1100   BP:  136/83 138/86   Pulse:  101 98   Resp:  22 20   Temp:  99.1 ??F (37.3 ??C)    SpO2:  96% 96%   Weight: 206 lb (93.4 kg)     Height: 5\' 5"  (1.651 m)  MDM      CONSULTS:  None    PROCEDURES:  Unless otherwise noted below, none     Procedures    FINAL IMPRESSION      1. Bronchitis          DISPOSITION/PLAN   DISPOSITION     PATIENT REFERRED TO:  Sharmon Leyden, MD  [address]  [place] [address]  [phone]    Schedule an appointment as soon as possible for a visit        DISCHARGE MEDICATIONS:       Discharge Medication List as of 04/09/2015 10:41 AM           Medication List      START taking these medications          benzonatate 200 MG capsule   Commonly known as:  TESSALON   Take 1 capsule by mouth 3 times daily as needed for Cough         CHANGE how you take these medications          * albuterol sulfate HFA 108 (90 BASE) MCG/ACT inhaler   What changed:  Another medication with the same name was added. Make sure you understand how and when to take each.       * albuterol sulfate HFA 108 (90 BASE) MCG/ACT inhaler   Commonly known as:  VENTOLIN HFA   Inhale 2 puffs into the lungs every 4 hours as needed for Wheezing   What changed:  You were already taking a medication with the same name, and this prescription was added. Make sure you understand how and when to take each.       traZODone 100 MG tablet   Commonly known as:  DESYREL   Take 1 tablet by mouth nightly as needed for Sleep   What changed:    - how much to take  - when to take this       * Notice:  This list has 2 medication(s) that are the same as other medications prescribed for you. Read the directions carefully, and ask your doctor or other care provider to review them with you.      CONTINUE taking these medications          amoxicillin-clavulanate 500-125 MG per tablet   Commonly known as:  AUGMENTIN       buPROPion 150 MG extended release tablet   Commonly known as:  WELLBUTRIN SR   Take 1 tablet by mouth daily       carbidopa-levodopa 10-100 MG per tablet   Commonly known as:  SINEMET       glipiZIDE 10 MG tablet   Commonly known as:  GLUCOTROL   Take 1 tablet by mouth every  morning (before breakfast)       lubiprostone 24 MCG capsule   Commonly known as:  AMITIZA   Take 1 capsule by mouth 2 times daily (with meals)       montelukast 10 MG tablet   Commonly known as:  SINGULAIR       Ospemifene 60 MG Tabs   Take 1 tablet by mouth daily       OXcarbazepine 150 MG tablet   Commonly known as:  TRILEPTAL   Take 1 tablet by mouth 2 times daily       QUEtiapine 400 MG tablet   Commonly known as:  SEROQUEL       ranitidine 150 MG tablet   Commonly known  as:  ZANTAC       traMADol 50 MG tablet   Commonly known as:  ULTRAM   Take 1 tablet by mouth every 8 hours as needed for Pain       vitamin D [address] UNITS Caps capsule   Commonly known as:  ERGOCALCIFEROL            Where to Get Your Medications      You can get these medications from any pharmacy     Bring a paper prescription for each of these medications    ??? albuterol sulfate HFA 108 (90 BASE) MCG/ACT inhaler   ??? benzonatate 200 MG capsule             (Please note that portions of this note were completed with a voice recognition program.  Efforts were made to edit the dictations but occasionally words are mis-transcribed.)             Lyndee Hensen, APRN  04/09/15 1745

## 2015-04-09 NOTE — Discharge Instructions (Signed)
Bronchitis: Care Instructions  Your Care Instructions    Bronchitis is inflammation of the bronchial tubes, which carry air to the lungs. The tubes swell and produce mucus, or phlegm. The mucus and inflamed bronchial tubes make you cough. You may have trouble breathing.  Most cases of bronchitis are caused by viruses like those that cause colds. Antibiotics usually do not help and they may be harmful.  Bronchitis usually develops rapidly and lasts about 2 to 3 weeks in otherwise healthy people.  Follow-up care is a key part of your treatment and safety. Be sure to make and go to all appointments, and call your doctor if you are having problems. It's also a good idea to know your test results and keep a list of the medicines you take.  How can you care for yourself at home?   Take all medicines exactly as prescribed. Call your doctor if you think you are having a problem with your medicine.   Get some extra rest.   Take an over-the-counter pain medicine, such as acetaminophen (Tylenol), ibuprofen (Advil, Motrin), or naproxen (Aleve) to reduce fever and relieve body aches. Read and follow all instructions on the label.   Do not take two or more pain medicines at the same time unless the doctor told you to. Many pain medicines have acetaminophen, which is Tylenol. Too much acetaminophen (Tylenol) can be harmful.   Take an over-the-counter cough medicine that contains dextromethorphan to help quiet a dry, hacking cough so that you can sleep. Avoid cough medicines that have more than one active ingredient. Read and follow all instructions on the label.   Breathe moist air from a humidifier, hot shower, or sink filled with hot water. The heat and moisture will thin mucus so you can cough it out.   Do not smoke. Smoking can make bronchitis worse. If you need help quitting, talk to your doctor about stop-smoking programs and medicines. These can increase your chances of quitting for good.  When should you call  for help?  Call 911 anytime you think you may need emergency care. For example, call if:   You have severe trouble breathing.  Call your doctor now or seek immediate medical care if:   You have new or worse trouble breathing.   You cough up dark brown or bloody mucus (sputum).   You have a new or higher fever.   You have a new rash.  Watch closely for changes in your health, and be sure to contact your doctor if:   You cough more deeply or more often, especially if you notice more mucus or a change in the color of your mucus.   You are not getting better as expected.  Where can you learn more?  Go to [email or web address] and sign in to your MyChart account. Enter H333 in the Search Health Information box to learn more about "Bronchitis: Care Instructions."     If you do not have an account, please click on the "Sign Up Now" link.  Current as of: Jul 11, 2014  Content Version: 11.1   2006-2016 [employer]. Care instructions adapted under license by [facility]. If you have questions about a medical condition or this instruction, always ask your healthcare professional. [employer] disclaims any warranty or liability for your use of this information.

## 2015-04-11 ENCOUNTER — Encounter

## 2015-04-13 ENCOUNTER — Inpatient Hospital Stay: Payer: BLUE CROSS/BLUE SHIELD | Primary: Emergency Medicine

## 2015-04-21 ENCOUNTER — Inpatient Hospital Stay: Admit: 2015-04-21 | Payer: BLUE CROSS/BLUE SHIELD | Primary: Emergency Medicine

## 2015-04-21 ENCOUNTER — Encounter

## 2015-04-21 ENCOUNTER — Inpatient Hospital Stay: Payer: BLUE CROSS/BLUE SHIELD | Primary: Emergency Medicine

## 2015-04-21 DIAGNOSIS — R922 Inconclusive mammogram: Secondary | ICD-10-CM

## 2015-05-15 ENCOUNTER — Inpatient Hospital Stay
Admit: 2015-05-15 | Discharge: 2015-05-16 | Payer: BLUE CROSS/BLUE SHIELD | Attending: Family | Primary: Emergency Medicine

## 2015-05-15 DIAGNOSIS — M5116 Intervertebral disc disorders with radiculopathy, lumbar region: Secondary | ICD-10-CM

## 2015-05-15 NOTE — Progress Notes (Signed)
[facility]  Patient Pain Assessment  Progress Note        Chief complaint:   Chief Complaint   Patient presents with   ??? Lower Back Pain   ??? Hernia     pain in abdominal area at times from this       Current Pain Assessment  Pain Assessment: 0-10  Pain Level: 2  Pain Type: Chronic pain  Pain Location: Back, Abdomen  Pain Orientation: Lower  Pain Radiating Towards: around right side of abdomen from hernia pain  Pain Descriptors: Aching, Throbbing  Pain Frequency: Intermittent  Pain Onset: On-going  Clinical Progression: Not changed  Effect of Pain on Daily Activities: limits daily activities       Pain medication effective - reports she takes the pain medication only as needed, not everyday, also says the injections are helping well, by having them done every 3 months  Issues of concern (physical, mental, social) or changes in condition since last visit per patient report - reports she had gallbladder removed January 2017    Data  Radiology exams received during the last 12 months: No  MRI exams received in the past 2 years: Yes  Physical therapy during the last 6 months: No     BMI - Body mass index is 34.78 kg/(m^2). - low fat, low carb diet discussed for weight reduction  Activity - discussed exercise as beneficial to pain reduction, encouraged stretching exercise     Tobacco use - never  Injection options discussed - yes, reports previous LESI with Percentage of Pain Relief after Last Procedure: 100 % How long lasted: Still helping, LESI done 03/29/15; she says it usually lasts about 3 months total   Acute bladder or bowel changes - no  Side effects - no     Social History     Social History   ??? Marital status: Married     Spouse name: Johnnie   ??? Number of children: 2   ??? Years of education: 68     Social History Main Topics   ??? Smoking status: Never Smoker   ??? Smokeless tobacco: Never Used   ??? Alcohol use No   ??? Drug use: No   ??? Sexual activity: Not Asked     Other Topics Concern   ??? None      Social History Narrative     Review of Systems  Other than stated above in the HPI, is negative                                                                                                                                                  UDS done today? no  Are you currently pregnant? no      Zadie Rhine reviewed/compliant? yes  Concern for prescription abuse? no         Past  Medical History      Diagnosis Date   ??? Abdominal pain, right lower quadrant    ??? 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 Arthritis    ??? Bipolar disorder (HCC)    ??? Blood circulation, collateral    ??? Cancer (HCC)      skin cancer   ??? Diabetes mellitus (HCC)    ??? Hearing aid worn      bilateral   ??? Hernia    ??? Hyperlipidemia    ??? MVP (mitral valve prolapse)    ??? Neuromuscular disorder (HCC) Inguinal Nerve pain   ??? Pleurisy without mention of effusion or current tuberculosis    ??? Right groin pain 02/28/2014   ??? Stomach ulcer      as a child   ??? Unspecified asthma(493.90)    ??? Unspecified constipation    ??? Unspecified hemorrhoids without mention of complication        Surgical History  Past Surgical History   Procedure Laterality Date   ??? Rotator cuff repair Right    ??? Tonsillectomy     ??? Hysterectomy     ??? Appendectomy     ??? Wisdom tooth extraction     ??? Abdominal exploration surgery       times two due to adhesions   ??? Colonoscopy  05/19/11     Dr Renato Gails   ??? Colonoscopy  07/15/2012     Dr. Zollie Beckers   ??? Skin biopsy  L and R wrists   ??? Abdomen surgery     ??? Colon surgery  04/2013     1 polyp removed   ??? Tubal ligation     ??? Cholecystectomy, laparoscopic N/A 02/24/2015     CHOLECYSTECTOMY LAPAROSCOPIC performed by Gabriel Rainwater, MD at [facility] OR   ??? Cholecystectomy, laparoscopic         Medications  Current Outpatient Prescriptions   Medication Sig Dispense Refill   ??? NONFORMULARY Arthritis hand ointment     ??? amoxicillin-clavulanate (AUGMENTIN) 500-125 MG per tablet Take 1 tablet by mouth 2 times daily     ??? benzonatate (TESSALON) 200 MG  capsule Take 1 capsule by mouth 3 times daily as needed for Cough 20 capsule 0   ??? albuterol sulfate HFA (VENTOLIN HFA) 108 (90 BASE) MCG/ACT inhaler Inhale 2 puffs into the lungs every 4 hours as needed for Wheezing 1 Inhaler 0   ??? Ospemifene 60 MG TABS Take 1 tablet by mouth daily 90 tablet 3   ??? traMADol (ULTRAM) 50 MG tablet Take 1 tablet by mouth every 8 hours as needed for Pain 12 tablet 0   ??? QUEtiapine (SEROQUEL) 400 MG tablet Take 400 mg by mouth 2 times daily     ??? carbidopa-levodopa (SINEMET) 10-100 MG per tablet Take 1 tablet by mouth 3 times daily     ??? albuterol sulfate HFA 108 (90 BASE) MCG/ACT inhaler Inhale 2 puffs into the lungs 3 times daily     ??? montelukast (SINGULAIR) 10 MG tablet Take 10 mg by mouth nightly     ??? vitamin D (ERGOCALCIFEROL) 50000 UNITS CAPS capsule      ??? ranitidine (ZANTAC) 150 MG tablet      ??? traZODone (DESYREL) 100 MG tablet Take 1 tablet by mouth nightly as needed for Sleep (Patient taking differently: Take 300 mg by mouth nightly ) 30 tablet 0   ??? buPROPion SR (WELLBUTRIN SR) 150 MG SR tablet  Take 1 tablet by mouth daily 60 tablet 3   ??? OXcarbazepine (TRILEPTAL) 150 MG tablet Take 1 tablet by mouth 2 times daily 60 tablet 0   ??? lubiprostone (AMITIZA) 24 MCG capsule Take 1 capsule by mouth 2 times daily (with meals) 60 capsule 11   ??? glipiZIDE (GLUCOTROL) 10 MG tablet Take 1 tablet by mouth every morning (before breakfast) 60 tablet 3     No current facility-administered medications for this encounter.      Allergies  Tobacco [nicotiana tabacum]; Decongestant [pseudoephedrine hcl]; Motrin [ibuprofen]; and Cabbage    Family History  family history includes Alcohol Abuse in her brother, father, and mother; Cancer in her paternal grandfather; Hypertension in her maternal grandmother and mother; Mental Illness in her daughter, father, maternal grandmother, and mother. There is no history of Colon Cancer, Colon Polyps, Liver Cancer, Stomach Cancer, Ulcerative Colitis, Crohn's  Disease, Liver Disease, or Rectal Cancer.     UDS:               No current UDS; obtain next visit         Vitals   Visit Vitals   ??? BP 143/69   ??? Pulse 96   ??? Temp 98.1 ??F (36.7 ??C) (Temporal)   ??? Resp 16   ??? Ht 5\' 5"  (1.651 m)   ??? Wt 209 lb (94.8 kg)   ??? SpO2 94%   ??? BMI 34.78 kg/m2      Physical Exam  General appearance: no acute distress  Head: NCAT, EOMI  SKIN: Warm, Dry   Musculoskeletal: ambulatory per self, steady gait  Neurologic: alert and oriented X 3, speech clear  Mood and affect: appropriate, no SI or HI     ASSESSMENT  Patient Active Problem List   Diagnosis   ??? RLQ abdominal pain   ??? Rectal bleeding   ??? Constipation   ??? Chronic constipation   ??? Obesity (BMI 30-39.9)   ??? MVP (mitral valve prolapse)   ??? Type 2 diabetes mellitus (HCC)   ??? Right lumbar radiculopathy   ??? Chronic constipation   ??? Hypercholesteremia   ??? Vitamin D deficiency   ??? Suicidal ideations   ??? Right groin pain   ??? Ilioinguinal neuralgia of right side   ??? Bipolar disorder current episode depressed (HCC)   ??? Suicidal ideation   ??? PTSD (post-traumatic stress disorder)   ??? Ilioinguinal neuralgia of right side   ??? Right inguinal pain   ??? Lumbar disc disease with radiculopathy   ??? Lumbar radiculopathy   ??? Acalculous cholecystitis        PLAN     1.  Patient is to call with any questions or concerns which may arise prior to the next office visit   2.  Continue current dosage of Tramadol per [facility]ASPER fill date, reports she has refills  3.  Schedule of repeat LESI injection, with sedation, to bring driver, no food/drink four hours prior to procedure      Controlled Substance Monitoring: Discussed with patient possible medication side effects, risk of tolerance and/or dependence, and alternative treatments.       Electronically signed by Donnetta HailJulie A Ashlinn Hemrick, APRN on 05/15/2015 at 4:08 PM

## 2015-05-15 NOTE — Progress Notes (Signed)
Nursing Admission Record    Current Issues / Falls / ER Visits:  No    Percentage of Pain Relief after Last Procedure:  100 %    How long lasted:  Still lasting from February LESI and usually last about 3 months total.    Radiology exams received during the last 12 months: No       When na                                              Where na       Imaging on chart: No         Imaging records requested: No  MRI exams received in the past 2 years:  Yes  Physical therapy during the last 6 months: No       When: na                                             Where na  Labs during the last 12 months: Yes    Education Provided:  [x]  Review of [place]  []  Agreement Review  []  Compliance Issues Discussed    []  Cognitive Behavior Needs [x]  Exercise []  Review of Test []  Financial Issues  [x]  Tobacco/Alcohol Use [x]  Teaching []  New Patient []  Picture Obtained    Physician Plan:  []  Outgoing Referral  []  Pharmacy Consult  []  Test Ordered   []  Obtained Test Results / Consult Notes  [x]  UDS due at next visit, verified per EPIC      []  Suspected Physical Abuse or Suicide Risk assessed - IF YES COMPLETE QUESTIONS BELOW    If any of the following questions are answered yes - contact attending physician for referral:    Has been considering harming self to escape stress, pain problems?  []  YES  [x]  NO  Has a suicide plan? []  YES  [x]  NO  Has attempted suicide in the past?   []  YES  [x]  NO  Has a close friend or family member who committed suicide?  []  YES  [x]  NO    Patient Referred To :     Additional Notes:    Assessment Completed by:  Electronically signed by Erroll Luna, RN on 05/15/2015 at 3:59 PM

## 2015-06-29 ENCOUNTER — Encounter

## 2015-06-29 ENCOUNTER — Inpatient Hospital Stay
Admit: 2015-06-29 | Discharge: 2015-06-29 | Payer: BLUE CROSS/BLUE SHIELD | Attending: Pain Medicine | Primary: Emergency Medicine

## 2015-06-29 ENCOUNTER — Inpatient Hospital Stay: Admit: 2015-06-29 | Discharge: 2015-06-29 | Payer: BLUE CROSS/BLUE SHIELD | Primary: Emergency Medicine

## 2015-06-29 DIAGNOSIS — M5136 Other intervertebral disc degeneration, lumbar region: Secondary | ICD-10-CM

## 2015-06-29 MED ORDER — SODIUM CHLORIDE 0.9 % IJ SOLN
0.9 % | Freq: Once | INTRAMUSCULAR | Status: AC | PRN
Start: 2015-06-29 — End: 2015-06-29
  Administered 2015-06-29: 19:00:00 4

## 2015-06-29 MED ORDER — TRAMADOL HCL 50 MG PO TABS
50 | ORAL_TABLET | Freq: Three times a day (TID) | ORAL | 2 refills | Status: DC | PRN
Start: 2015-06-29 — End: 2015-10-11

## 2015-06-29 MED ORDER — METHYLPREDNISOLONE ACETATE 80 MG/ML IJ SUSP
80 | INTRAMUSCULAR | Status: AC
Start: 2015-06-29 — End: 2015-06-29

## 2015-06-29 MED ORDER — METHYLPREDNISOLONE ACETATE 80 MG/ML IJ SUSP
80 MG/ML | Freq: Once | INTRAMUSCULAR | Status: AC | PRN
Start: 2015-06-29 — End: 2015-06-29
  Administered 2015-06-29: 19:00:00 80 via EPIDURAL

## 2015-06-29 MED ORDER — MIDAZOLAM HCL 2 MG/2ML IJ SOLN
2 | INTRAMUSCULAR | Status: AC
Start: 2015-06-29 — End: 2015-06-29

## 2015-06-29 MED ORDER — SODIUM CHLORIDE 0.9 % IJ SOLN
0.9 | INTRAMUSCULAR | Status: AC
Start: 2015-06-29 — End: 2015-06-29

## 2015-06-29 MED ORDER — BUPIVACAINE HCL (PF) 0.25 % IJ SOLN
0.25 % | Freq: Once | INTRAMUSCULAR | Status: AC | PRN
Start: 2015-06-29 — End: 2015-06-29
  Administered 2015-06-29: 19:00:00 5 via EPIDURAL

## 2015-06-29 MED ORDER — BUPIVACAINE HCL (PF) 0.25 % IJ SOLN
0.25 | INTRAMUSCULAR | Status: AC
Start: 2015-06-29 — End: 2015-06-29

## 2015-06-29 MED ORDER — LIDOCAINE HCL (PF) 1 % IJ SOLN
1 % | Freq: Once | INTRAMUSCULAR | Status: AC | PRN
Start: 2015-06-29 — End: 2015-06-29
  Administered 2015-06-29: 19:00:00 3 via SUBCUTANEOUS

## 2015-06-29 MED ORDER — IOHEXOL 240 MG/ML IJ SOLN
240 MG/ML | Freq: Once | INTRAMUSCULAR | Status: AC | PRN
Start: 2015-06-29 — End: 2015-06-29
  Administered 2015-06-29: 19:00:00 2 via EPIDURAL

## 2015-06-29 MED ORDER — LIDOCAINE-EPINEPHRINE 1 %-1:200000 IJ SOLN
1 percent-:200000 | Freq: Once | INTRAMUSCULAR | Status: AC | PRN
Start: 2015-06-29 — End: 2015-06-29
  Administered 2015-06-29: 19:00:00 2 via EPIDURAL

## 2015-06-29 MED ORDER — MIDAZOLAM HCL 2 MG/2ML IJ SOLN
22 MG/ML | Freq: Once | INTRAMUSCULAR | Status: AC | PRN
Start: 2015-06-29 — End: 2015-06-29
  Administered 2015-06-29: 19:00:00 2 via INTRAVENOUS

## 2015-06-29 MED FILL — SODIUM CHLORIDE 0.9 % IJ SOLN: 0.9 % | INTRAMUSCULAR | Qty: 10

## 2015-06-29 MED FILL — MIDAZOLAM HCL 2 MG/2ML IJ SOLN: 2 MG/ML | INTRAMUSCULAR | Qty: 2

## 2015-06-29 MED FILL — DEPO-MEDROL 80 MG/ML IJ SUSP: 80 MG/ML | INTRAMUSCULAR | Qty: 1

## 2015-06-29 MED FILL — SENSORCAINE-MPF 0.25 % IJ SOLN: 0.25 % | INTRAMUSCULAR | Qty: 10

## 2015-06-29 NOTE — Procedures (Signed)
DATE: 06/29/2015      REASON FOR VISIT: Principal Problem:    Right lumbar radiculopathy        PROCEDURE: Lumbar Epidural Steroid Injection.   [x]  Moderate Sedation    DESCRIPTION OF PROCEDURE:              After obtaining informed consent, the patient was taken to the procedure room, positioned prone, and sterilely prepped.  The procedure was performed under fluoroscopic guidance.  First 5 ml of 1% Xylocaine was used at L1- 2 for local anesthesia.  A 19-gauge Hustead needle was advanced to the epidural space.  The was confirmed on the fluoroscope with an injection of [x]  2ml  []  ml Contrast.  Then, [x]  5ml of 0.25% Marcaine, [x]  4ml of Normal Saline, and [x]  80 mg of Depo Medrol was gently injected.        There were no complications.    DIAGNOSES:  []  Low Back Pain  [x]  *Lumbar Radiculopathy  [x]  *Lumbar Degenerative Disc Disease  []  *Lumbar Spinal Stenosis  []  *Lumbar Postlaminectomy Syndrome  []  Other    EXAM  AIRWAY ADEQUATE  LUNGS CLEAR AT AUSCULTATION  HEART RRR    COMMENTS:  Patient to call if she wants to proceed with MRI of Lumbar.  Pt c/o low back pain that radiates down the right leg to the foot.  Pain score today is 8/10.  Will continue with the lumbar epidural with sedation.  Discussed risks and complications with the patient. She has recurrence of her right leg radicular symptoms. This as well as her abdominal pain resolves with the LESI.  PLAN:  [x]  Will return to office in  3 month(s)for  [x]  Planned Procedure  []  Office Visit  [x]  Prescriptions were given today   []  No prescriptions needed today  [x]  Patient is to call with any questions or concerns which may arise prior to the next office visit.          Beatrix Fetters, MD                [x]  Over 50% of today's appointment was given to discussion, evaluation and counseling.

## 2015-06-29 NOTE — Progress Notes (Signed)
Procedure:  Level of Consciousness: [x]Alert [x]Oriented []Disoriented []Lethargic  Anxiety Level: [x]Calm []Anxious []Depressed []Other  Skin: [x]Warm [x]Dry []Cool []Moist []Intact []Other  Cardiovascular: []Palpitations: [x]Never []Occasionally []Frequently  Chest Pain: [x]No []Yes  Respiratory:  [x]Unlabored []Labored []Cough ([] Productive []Unproductive)  HCG Required: [x]No []Yes   Results: []Negative []Positive  Knowledge Level:        [x]Patient/Other verbalized understanding of pre-procedure instructions.        [x]Assessment of post-op care needs (transportation, responsible caregiver)        [x]Able to discuss health care problems and how to deal with it.  Factors that Affect Teaching:        Language Barrier: [x]No []Yes - why:        Hearing Loss:        [x]No []Yes            Corrective Device:  []Yes []No        Vision Loss:           []No [x]Yes            Corrective Device:  []Yes []No        Memory Loss:       [x]No []Yes            []Short Term []Long Term  Motivational Level:  [x]Asks Questions                  []Extremely Anxious       [x]Seems Interested               []Seems Uninterested                  [x]Denies need for Education  Risk for Injury:  [x]Patient oriented to person, place and time  []History of frequent falls/loss of balance  Nutritional:  []Change in appetite   []Weight Gain   []Weight Loss  Functional:  []Requires assistance with ADL's

## 2015-06-29 NOTE — Discharge Instructions (Signed)
PRE PROCEDURE INSTRUCTIONS    You must have a driver present in the office for your procedure to be done.    If you are having sedation with your procedure you must not have anything to eat or drink 4 hours prior to your appointment time.  If you need to take medications you may take with a small sip of water only.    Please arrive 30 minutes prior to your scheduled appointment time to register downstairs.Dr. Claudie Revering  Discharge Instructions / Information        You have had the procedure(s) called:    []  Cervical Epidural     [x]  Lumbar Epidural     []  _____________Injection  []  Cervical Facets  []  Lumbar Facets []  Radiofrequency Lesioning  []  Occipital Nerve Blocks []  Caudal Epidural []  Transforaminal Epidural  []  Trigger Point Injections []  SI Joint Injection []  ______________________    Please follow these instructions carefully.      If you have questions or problems you may call [phone].     You have received the following medications:  [x]  Lidocaine [x]  Bupivicaine: []  0.50%   []  0.25% [x]  DepoMedrol   [x]  Normal Saline  [x]  Versed []  Sodium Bicarbonate   []  Isovue   []  Botox  []  Kenalog   []  Diprovan  [x] omnipaque _____________________    PATIENT INFORMATION:  You may experience the following symptoms after your procedure.  These symptoms are normal and should not cause alarm.     An increase in our pain may occurr.  This may last 24-48 hours after your procedure.   No change in pain.   Weakness or numbness in your affected extremity.  This will usually only last a few hours.    REMEMBER TO REPORT THE FOLLOWING SYMPTOMS TO YOUR DOCTOR:   Redness, swelling. Or drainage at the injection site.   Unusual pain that interferes with your usual activities of daily living.    OTHER INSTRUCTIONS:  [x]  I will apply ice to the injection site for 24 hours.  Apply 15-2- minutes and take off 40 minutes.  [x]  I understand if a steroid was used it will take effect in 3-5 days.  [x]  I have received my personal  belongings and valuable.  [x]  I have received a copy of my discharge instructions and understand to my satisfaction.  [x]  I will not drive for []  12 hours    [x]  24 hours after my injections.  [x]  I understand that today I will  [x]  rest  []  walk and move freely  []  other _________________  []  Additional instructions:  ____________________________________________________________________________        Patient Discharge:  [x]  Home  []  Hospital  []  Other  ___________________________________    How:  []  Ambulatory  [x]  Wheelchair   []  Stretcher   []  __________________________________    Accompanied by:  [x]  Family Member  []  [facility] Staff  []  Ambulance Staff    []  Other_____________________________    Discharge criteria met.  Post procedure dressing dry and intact.  Sensory and motor function intact as per pre-procedure.  Instructions and follow up reviewed with pt at patient at discharge.

## 2015-06-29 NOTE — Progress Notes (Signed)
Nursing Admission Record    Current Issues / Falls / ER Visits:  No    Percentage of Pain Relief after Last Procedure:  100 %    How long lasted:  10 weeks    Radiology exams received during the last 12 months: No       When na                                              Where na       Imaging on chart: No         Imaging records requested: No  MRI exams received in the past 2 years:  Yes  Physical therapy during the last 6 months: No       When: na                                             Where na  Labs during the last 12 months: Yes    Education Provided:  [x]  Review of [place]  []  Agreement Review  []  Compliance Issues Discussed    []  Cognitive Behavior Needs [x]  Exercise []  Review of Test []  Financial Issues  []  Tobacco/Alcohol Use [x]  Teaching []  New Patient []  Picture Obtained    Physician Plan:  []  Outgoing Referral  []  Pharmacy Consult  []  Test Ordered   []  Obtained Test Results / Consult Notes  []  UDS due at next visit, verified per EPIC      []  Suspected Physical Abuse or Suicide Risk assessed - IF YES COMPLETE QUESTIONS BELOW    If any of the following questions are answered yes - contact attending physician for referral:    Has been considering harming self to escape stress, pain problems?  []  YES  [x]  NO  Has a suicide plan? []  YES  [x]  NO  Has attempted suicide in the past?   []  YES  [x]  NO  Has a close friend or family member who committed suicide?  []  YES  [x]  NO    Patient Referred To :     Additional Notes:    Assessment Completed by:  Electronically signed by Laneta Simmers, RN on 06/29/2015 at 1:43 PM1

## 2015-06-29 NOTE — Progress Notes (Signed)
Patient Name: Joyce Cohen  DOB: 1955-10-23  MRN: [id number]    PRE-SEDATION ASSESSMENT    Procedure:  @PROCEDURE @  I have examined the patient's status immediately prior to the procedure.      BRIEF H&P    HPI/Changes/Indicators/Diagnosis  There are no active hospital problems to display for this patient.      Medications:  Prior to Admission medications    Medication Sig Start Date End Date Taking? Authorizing Provider   traMADol (ULTRAM) 50 MG tablet Take 1 tablet by mouth every 8 hours as needed for Pain One month supply 06/29/15   Beatrix Fetters, MD   NONFORMULARY Arthritis hand ointment    Historical Provider, MD   amoxicillin-clavulanate (AUGMENTIN) 500-125 MG per tablet Take 1 tablet by mouth 2 times daily    Historical Provider, MD   benzonatate (TESSALON) 200 MG capsule Take 1 capsule by mouth 3 times daily as needed for Cough 04/09/15   Lyndee Hensen, APRN   albuterol sulfate HFA (VENTOLIN HFA) 108 (90 BASE) MCG/ACT inhaler Inhale 2 puffs into the lungs every 4 hours as needed for Wheezing 04/09/15   Lyndee Hensen, APRN   Ospemifene 60 MG TABS Take 1 tablet by mouth daily 03/27/15   Gaye Alken, MD   QUEtiapine (SEROQUEL) 400 MG tablet Take 400 mg by mouth 2 times daily    Historical Provider, MD   carbidopa-levodopa (SINEMET) 10-100 MG per tablet Take 1 tablet by mouth 3 times daily    Historical Provider, MD   albuterol sulfate HFA 108 (90 BASE) MCG/ACT inhaler Inhale 2 puffs into the lungs 3 times daily    Historical Provider, MD   montelukast (SINGULAIR) 10 MG tablet Take 10 mg by mouth nightly    Historical Provider, MD   vitamin D (ERGOCALCIFEROL) 50000 UNITS CAPS capsule  07/13/14   Historical Provider, MD   ranitidine (ZANTAC) 150 MG tablet  07/13/14   Historical Provider, MD   traZODone (DESYREL) 100 MG tablet Take 1 tablet by mouth nightly as needed for Sleep  Patient taking differently: Take 300 mg by mouth nightly  05/16/14   Maylene Roes, MD   buPROPion SR [facility] SR) 150 MG SR tablet Take  1 tablet by mouth daily 05/16/14   Maylene Roes, MD   OXcarbazepine (TRILEPTAL) 150 MG tablet Take 1 tablet by mouth 2 times daily 05/16/14   Maylene Roes, MD   lubiprostone (AMITIZA) 24 MCG capsule Take 1 capsule by mouth 2 times daily (with meals) 03/17/14   Percival Spanish, APRN   glipiZIDE (GLUCOTROL) 10 MG tablet Take 1 tablet by mouth every morning (before breakfast) 02/07/14   Jeanene Erb, DO       Allergies:  is allergic to tobacco [nicotiana tabacum]; decongestant [pseudoephedrine hcl]; motrin [ibuprofen]; and cabbage.    Vital Signs:  Vitals:    06/29/15 1437   BP: 134/72   Pulse: 84   Resp: 16   Temp:    SpO2: 95%       Physical Exam:  Cardiac:                                    [x] WNL                    [] Comments:    Pulmonary:                               [  x]WNL                    [] Comments:    Neuro/Mental Status:               [x] WNL                    [] Comments:      Informed Consent:  The risks and benefits of the procedure have been discussed with either the patient or if they cannot consent their representative.    Assessment:  Patient examined and appropriate for the planned sedation and procedure.    Plan:  Proceed with planned sedation and procedure as above.     Mallampati Airway Assessment: Adequate      ASA STATUS:  [] 1. Normal Healty  [] 2. Mild Systemice Disease, doesn't limit activity eg HTN, mild DM  [x] 3. Severe Systemic Disease, does limit activity eg stable angina, DM with vascular         disease          [] 4.Severe Systemic Disease constant threat eg CHF, renal failure  [] 5. Moribund not expected to survive without procedure          Kennith Center, RN

## 2015-07-06 LAB — PAIN MANAGEMENT PROFILE (13 DRUGS), URINE
Amphetamines, urine: NEGATIVE
Barbiturates, Urine: NEGATIVE ng/mL
Benzodiazepines, Urine: NEGATIVE ng/mL
Cannabinoids, Urine: NEGATIVE ng/mL
Cocaine Metabolite, Urine: NEGATIVE ng/mL
Creatinine, Urine: 268.3 mg/dL (ref 20.0–300.0)
Ethanol U, Quan: NEGATIVE %
Fentanyl, Ur: NEGATIVE pg/mL
Meperidine, Ur: NEGATIVE ng/mL
Methadone Screen, Urine: NEGATIVE ng/mL
Opiates, Urine: NEGATIVE ng/mL
Oxycodone/Oxymorphone, Ur: NEGATIVE ng/mL
Phencyclidine, Urine: NEGATIVE ng/mL
Propoxyphene, Urine: NEGATIVE ng/mL
pH, Urine: 5.7 (ref 4.5–8.9)

## 2015-08-16 ENCOUNTER — Encounter: Attending: Family | Primary: Emergency Medicine

## 2015-08-31 ENCOUNTER — Inpatient Hospital Stay
Admit: 2015-08-31 | Discharge: 2015-08-31 | Payer: BLUE CROSS/BLUE SHIELD | Attending: Family | Primary: Emergency Medicine

## 2015-08-31 DIAGNOSIS — M5116 Intervertebral disc disorders with radiculopathy, lumbar region: Secondary | ICD-10-CM

## 2015-08-31 NOTE — Progress Notes (Signed)
Nursing Admission Record    Current Issues / Falls / ER Visits:  Wants to proceed with MRI that Dr. Sandria Manly discussed with her at last appointment.    Percentage of Pain Relief after Last Procedure:  100 %    How long lasted:  2 months    Radiology exams received during the last 12 months: No       When na                                              Where na       Imaging on chart: No         Imaging records requested: No  MRI exams received in the past 2 years:  No  Physical therapy during the last 6 months: No       When: na                                             Where na  Labs during the last 12 months: Yes    Education Provided:  [x]  Review of [place]  []  Agreement Review  []  Compliance Issues Discussed    []  Cognitive Behavior Needs [x]  Exercise []  Review of Test []  Financial Issues  [x]  Tobacco/Alcohol Use [x]  Teaching []  New Patient []  Picture Obtained    Physician Plan:  []  Outgoing Referral  []  Pharmacy Consult  []  Test Ordered   []  Obtained Test Results / Consult Notes  []  UDS due at next visit, verified per EPIC      []  Suspected Physical Abuse or Suicide Risk assessed - IF YES COMPLETE QUESTIONS BELOW    If any of the following questions are answered yes - contact attending physician for referral:    Has been considering harming self to escape stress, pain problems?  []  YES  [x]  NO  Has a suicide plan? []  YES  [x]  NO  Has attempted suicide in the past?   []  YES  [x]  NO  Has a close friend or family member who committed suicide?  []  YES  [x]  NO    Patient Referred To :     Additional Notes:    Assessment Completed by:  Electronically signed by Erroll Luna, RN on 08/31/2015 at 12:05 PM

## 2015-08-31 NOTE — Progress Notes (Signed)
MRI INFORMATION SHEET    YOU MUST ENTER IN EPIC THE ACTUAL MRI ORDER    Patient Name: Joyce Cohen  DOB: 01-29-1956  MRN: [id number]  DATE: 08/31/2015    Patient Weight: 214lb    Phone Number: [phone]     (Phone must be a working phone number where the patient can be reached)    MRI:      []  Cervical    []  Thoracic    [x]  Lumbar    []  Sacral    SPINE                             []   Knee        []  Shoulder   []   Other __________________________________    [x]  With Contrast    [x]  Without Contrast  *If the MRI is ordered with contrast you MUST also order without contrast*                 TYPE:    [x]  OPEN    []  CLOSED    Where would patient like the MRI done? [facility]    DIAGNOSIS:   1. __Lumbar pain with radiculopathy________________________    ICD-10____________                                  2. ___________________________  ICD-10____________                            3.____________________________ ICD-10____________                            4.____________________________ ICD-10____________    QUESTIONS FOR THE PATIENT:  1a.. Do you have pain pump in place?                             []  Yes  [x]  No  1b. Do you have a pacemaker?                                     []  Yes  [x]  No  2. Do you have a neuro-stimulator?                             []  Yes  [x]  No  3. Do you have a heart defibrillator?                            []  Yes  [x]  No  4. Do you have a mechanical heart valve?                  []  Yes  [x]  No  5. Do you have a brain aneurysm clip?                        []  Yes  [x]  No  6. Do you have a cochlear ear implant?                       []  Yes  [x]  No  7. Do you have metal in your  body?                             []  Yes  [x]  No       If yes, what type and date placed:_________________________________________  8. Have you had surgery within the past 6 months?     []  Yes  [x]  No       If yes, what type and date performed:______________________________________  9. Do you have pain pump in place?                              []  Yes  [x]  No      IF PATIENT IS 60 YEARS OR OLDER OR DIABETIC AND CONTRAST IS UTILIZED, PLEASE ORDER A SEPARATE BUN AND CREATININE IN EPIC.    []  YOU DO NOT HAVE TO CONTINUE IF MEDICARE PATIENT UNLESS HAS SECONDARY OF [facility]*    ALL INTRATHECAL PAIN PUMPS MANAGED BY DR. Victory Dakin LOVE SHOULD REPORT TO [facility] FOR INTERROGATION WITHIN 2 HOURS FOLLOWING THE MRI.  PLEASE SCHEDULE ALL MRI'S ON PUMP PATIENTS EARLY.  ALL MUST BE INTERROGATED BEFORE 3:00 PM.  PUMP ROOM CLOSED ON FRIDAY    PATIENT QUESTIONS FOR AUTHORIZATION PURPOSES    1.  Has the patient had Physical Therapy in the past?  []  Yes  [x]  No       If yes,            When?    __________________________________________            Where?   __________________________________________            Was the therapy effective? []  Yes []  No    2.  Has the patient had an MRI before?    [x]  Yes  []  No       If yes,            When?   __2014_______________________________            Where?  __[facility]_______________________________            Was it the same MRI as we are ordering?   [x]  Yes  []  No    3.  Has the patient taken or presently taking any NSAIDS?            Taken in the past:  []  Yes   [x]  No            Currently taking:     []  Yes  [x]  No    4.  Has the patient had any other diagnostic testing (ex: x-ray) that suggest to follow up         MRI?    []  Yes  [x]  No         If yes what type of test:_________________________________________________    5.  What symptoms is the patient having to constitute this test being ordered?       [x]  Pain Radiating         []  Upper  [x]  Lower  _buttocks_____________________________         []  Numbness in the ___________________________________________________         []  Stabbing pain in the _________________________________________________         []  Throbbing in the ____________________________________________________            []  Tingling sensation in the  ______________________________________________         [  x] Aching sensation in the _______________________________________________         []  Burning sensation in the ______________________________________________         Additional symptoms:___________________________________________________    ______________________________________________________________________    ______________________________________________________________________

## 2015-08-31 NOTE — Progress Notes (Addendum)
[facility]  Patient Pain Assessment  Progress Note       Chief Complaint   Patient presents with   ??? Tailbone Pain     across buttocks   ??? Lower Back Pain     Pain Assessment  Pain Assessment: 0-10  Pain Level: 8  Pain Type: Chronic pain  Pain Location: Back  Pain Orientation: Lower  Pain Radiating Towards: down buttocks  Pain Descriptors: Aching, Constant  Pain Frequency: Continuous  Pain Onset: On-going  Clinical Progression: Not changed  Effect of Pain on Daily Activities: daily activities    Pain medication assessment:      [x]   Pain control issues, comment if applicable:   Reports the pain radiating across her lower back into bilateral buttocks is not getting any better (will schedule MRI as advised per Dr. Sandria ManlyLove at last visit)  ??  Percentage of Pain Relief after Last Procedure:  100 %    How long lasted:  2 months  ??  Radiology exams received during the last 12 months: No  MRI exams received in the past 2 years:  No  Physical therapy during the last 6 months: No      []   No current narcotic medications   [x]   Reports current medication is helping, continues to have on-going pain    [x]   Reports current pain medication increases ability to do activities of daily living   [x]   Discussed possible medication side effects, risk of tolerance and/or dependence, alternative treatments    [x]   Encouraged to set goals of decreasing daily narcotic intake    [x]   Discussed effects of long term narcotic use       [] Yes [x] No  Current side effects, comment, if applicable:      [] Yes [x] No   Acute bladder or bowel changes      Issues of Concern / Previous Procedure / Percentage of pain control / Imaging /  PT History:    Patient reports she wants to proceed with MRI that Dr. Sandria ManlyLove discussed with her at last appointment    Percentage of Pain Relief after Last Procedure:  100 %    How long lasted:  2 months  ??  Radiology exams received during the last 12 months: No  MRI exams received in the past 2 years:   No  Physical therapy during the last 6 months: No     BMI: Body mass index is 35.61 kg/(m^2).   [x]   Nutrient rich, low fat, low carbohydrate diet discussed   [x]   Positive effect of weight management on pain control discussed    Activity:   [x]  Exercise discussed as beneficial to pain reduction, encouraged stretching exercises and to set daily goals    Tobacco use:    [x]  Cessation discussed, if applicable        Social History     Social History   ??? Marital status: Married     Spouse name: Johnnie   ??? Number of children: 2   ??? Years of education: 3915     Social History Main Topics   ??? Smoking status: Never Smoker   ??? Smokeless tobacco: Never Used   ??? Alcohol use No   ??? Drug use: No   ??? Sexual activity: Not Asked     Other Topics Concern   ??? None     Social History Narrative  Past Medical History:       Diagnosis Date   ??? Abdominal pain, right lower quadrant    ??? 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 Arthritis    ??? Bipolar disorder (HCC)    ??? Blood circulation, collateral    ??? Cancer (HCC)     skin cancer   ??? Diabetes mellitus (HCC)    ??? Hearing aid worn     bilateral   ??? Hernia    ??? Hyperlipidemia    ??? MVP (mitral valve prolapse)    ??? Neuromuscular disorder (HCC) Inguinal Nerve pain   ??? Pleurisy without mention of effusion or current tuberculosis    ??? Right groin pain 02/28/2014   ??? Stomach ulcer     as a child   ??? Unspecified asthma(493.90)    ??? Unspecified constipation    ??? Unspecified hemorrhoids without mention of complication        Surgical History:  Past Surgical History:   Procedure Laterality Date   ??? ABDOMEN SURGERY     ??? ABDOMINAL EXPLORATION SURGERY      times two due to adhesions   ??? APPENDECTOMY     ??? CHOLECYSTECTOMY, LAPAROSCOPIC N/A 02/24/2015    CHOLECYSTECTOMY LAPAROSCOPIC performed by Gabriel [facility]water[facility] at [facility]   ??? CHOLECYSTECTOMY, LAPAROSCOPIC     ??? COLON SURGERY  04/2013    1 polyp removed   ??? COLONOSCOPY  05/19/11    Dr  Renato Gails   ??? COLONOSCOPY  07/15/2012    Dr. Zollie Beckers   ??? HYSTERECTOMY     ??? ROTATOR CUFF REPAIR Right    ??? SKIN BIOPSY  L and R wrists   ??? TONSILLECTOMY     ??? TUBAL LIGATION     ??? WISDOM TOOTH EXTRACTION         Family History:  family history includes Alcohol Abuse in her brother, father, and mother; Cancer in her paternal grandfather; Hypertension in her maternal grandmother and mother; Mental Illness in her daughter, father, maternal grandmother, and mother. There is no history of Colon Cancer, Colon Polyps, Liver Cancer, Stomach Cancer, Ulcerative Colitis, Crohn's Disease, Liver Disease, or Rectal Cancer.     Allergies:  Tobacco [nicotiana tabacum]; Decongestant [pseudoephedrine hcl]; Motrin [ibuprofen]; and Cabbage     Medications:  Current Outpatient Prescriptions   Medication Sig Dispense Refill   ??? traMADol (ULTRAM) 50 MG tablet Take 1 tablet by mouth every 8 hours as needed for Pain One month supply 45 tablet 2   ??? NONFORMULARY Arthritis hand ointment     ??? amoxicillin-clavulanate (AUGMENTIN) 500-125 MG per tablet Take 1 tablet by mouth 2 times daily     ??? benzonatate (TESSALON) 200 MG capsule Take 1 capsule by mouth 3 times daily as needed for Cough 20 capsule 0   ??? albuterol sulfate HFA (VENTOLIN HFA) 108 (90 BASE) MCG/ACT inhaler Inhale 2 puffs into the lungs every 4 hours as needed for Wheezing 1 Inhaler 0   ??? Ospemifene 60 MG TABS Take 1 tablet by mouth daily 90 tablet 3   ??? QUEtiapine (SEROQUEL) 400 MG tablet Take 400 mg by mouth 2 times daily     ??? carbidopa-levodopa (SINEMET) 10-100 MG per tablet Take 1 tablet by mouth 3 times daily     ??? albuterol sulfate HFA 108 (90 BASE) MCG/ACT inhaler Inhale 2 puffs into the lungs 3 times daily     ??? montelukast (SINGULAIR) 10 MG tablet  Take 10 mg by mouth nightly     ??? vitamin D (ERGOCALCIFEROL) 50000 UNITS CAPS capsule      ??? ranitidine (ZANTAC) 150 MG tablet      ??? traZODone (DESYREL) 100 MG tablet Take 1 tablet by mouth nightly as needed for Sleep (Patient taking  differently: Take 300 mg by mouth nightly ) 30 tablet 0   ??? buPROPion SR (WELLBUTRIN SR) 150 MG SR tablet Take 1 tablet by mouth daily 60 tablet 3   ??? OXcarbazepine (TRILEPTAL) 150 MG tablet Take 1 tablet by mouth 2 times daily 60 tablet 0   ??? lubiprostone (AMITIZA) 24 MCG capsule Take 1 capsule by mouth 2 times daily (with meals) 60 capsule 11   ??? glipiZIDE (GLUCOTROL) 10 MG tablet Take 1 tablet by mouth every morning (before breakfast) 60 tablet 3     No current facility-administered medications for this encounter.      UDS:  (Tramadol PRN not tested on UDS)  Lab Results   Component Value Date    AMPHETUR Negative 06/29/2015    BARBITURATES Negative 06/29/2015    BENZODIAZURI Negative 06/29/2015    CANNANBINURI Negative 06/29/2015    COCAINEMETUR Negative 06/29/2015    OPIAU Negative 06/29/2015    OXYCOOXYMO Negative 06/29/2015    PHENCYCU Negative 06/29/2015    LABMETH Negative 06/29/2015    PPXUR Negative 06/29/2015    MEPERIDU Negative 06/29/2015    FENTU Negative 06/29/2015    ETHUQN Negative 06/29/2015            Vitals:  BP 123/74   Pulse 95   Temp 97.7 ??F (36.5 ??C) (Temporal)    Resp 16   Ht  (1.651 m)   Wt 214 lb (97.1 kg)   SpO2 94%   BMI 35.61 kg/m2      Discussed using home B/P monitor/diary and to contact PCP if remains elevated     Physical Exam:  General appearance: no acute distress  Head: NCAT, EOMI  Skin: Warm, Dry   Musculoskeletal: ambulatory per self, steady gait  Neurologic: alert and oriented X 3, speech clear  Mood and affect: appropriate, no SI or HI    Assessment:    *      Lumbar DDD  *      Lumbar Radiculopathy    Plan:     Patient is to call with any questions or concerns which may arise prior to the next office visit      Continue current medications, KASPER reviewed     Add     Imaging - Lumbar MRI scheduled per Dr. Imagene Gurney previous recommendation and date given to patient for 09/14/15      PT order given to patient     Procedure scheduled for next visit, see  encounter details     UDS done today     UDS next visit    Controlled Substance Monitoring: Discussed with patient possible medication side effects, risk of tolerance and/or dependence, and alternative treatments. Discussed the effects of long term narcotic use. Patient encouraged to set daily goals of exercising and decreasing daily narcotic intake.      Electronically signed by Donnetta Hail, APRN on 08/31/2015

## 2015-09-07 ENCOUNTER — Ambulatory Visit
Admit: 2015-09-07 | Discharge: 2015-09-07 | Payer: BLUE CROSS/BLUE SHIELD | Attending: Medical | Primary: Emergency Medicine

## 2015-09-07 DIAGNOSIS — R1031 Right lower quadrant pain: Secondary | ICD-10-CM

## 2015-09-07 NOTE — Progress Notes (Signed)
Subjective:      Patient ID: Joyce Cohen is a 59 y.o. female.    HPI   Joyce Cohen is a pleasant 60 y/o, w/f that was referred by Dr. Lyn Records due to problems with groin pain with a ? inguinal hernia. This was first noticed about 2-3 weeks ago after some strenuous lifting. He reports that the pain is primarily in the right groin. He reports he has not noticed a bulge and denies any problems with n/v, changes in his bowel habits, or prior history of hernias.        Allergies: Tobacco [nicotiana tabacum]; Decongestant [pseudoephedrine hcl]; Motrin [ibuprofen]; and Cabbage      Current Outpatient Prescriptions   Medication Sig Dispense Refill   . albuterol sulfate HFA (VENTOLIN HFA) 108 (90 BASE) MCG/ACT inhaler Inhale 2 puffs into the lungs every 4 hours as needed for Wheezing 1 Inhaler 0   . Ospemifene 60 MG TABS Take 1 tablet by mouth daily 90 tablet 3   . QUEtiapine (SEROQUEL) 400 MG tablet Take 400 mg by mouth 2 times daily     . carbidopa-levodopa (SINEMET) 10-100 MG per tablet Take 1 tablet by mouth 3 times daily     . montelukast (SINGULAIR) 10 MG tablet Take 10 mg by mouth nightly     . vitamin D (ERGOCALCIFEROL) 50000 UNITS CAPS capsule      . ranitidine (ZANTAC) 150 MG tablet      . traZODone (DESYREL) 100 MG tablet Take 1 tablet by mouth nightly as needed for Sleep (Patient taking differently: Take 300 mg by mouth nightly ) 30 tablet 0   . buPROPion SR (WELLBUTRIN SR) 150 MG SR tablet Take 1 tablet by mouth daily 60 tablet 3   . OXcarbazepine (TRILEPTAL) 150 MG tablet Take 1 tablet by mouth 2 times daily 60 tablet 0   . lubiprostone (AMITIZA) 24 MCG capsule Take 1 capsule by mouth 2 times daily (with meals) 60 capsule 11   . glipiZIDE (GLUCOTROL) 10 MG tablet Take 1 tablet by mouth every morning (before breakfast) 60 tablet 3   . HUMALOG 100 UNIT/ML injection vial      . Lancets MISC      . traMADol (ULTRAM) 50 MG tablet Take 1 tablet by mouth every 8 hours as needed for Pain One month supply 45  tablet 2   . NONFORMULARY Arthritis hand ointment       No current facility-administered medications for this visit.        Past Surgical History:   Procedure Laterality Date   . ABDOMEN SURGERY     . ABDOMINAL EXPLORATION SURGERY      times two due to adhesions   . APPENDECTOMY     . CHOLECYSTECTOMY, LAPAROSCOPIC N/A 02/24/2015    CHOLECYSTECTOMY LAPAROSCOPIC performed by Gabriel [facility]water[facility] at [facility] OR   . CHOLECYSTECTOMY, LAPAROSCOPIC     . COLON SURGERY  04/2013    1 polyp removed   . COLONOSCOPY  05/19/11    Dr Renato Gails   . COLONOSCOPY  07/15/2012    Dr. Zollie Beckers   . HYSTERECTOMY     . ROTATOR CUFF REPAIR Right    . SKIN BIOPSY  L and R wrists   . TONSILLECTOMY     . TUBAL LIGATION     . WISDOM TOOTH EXTRACTION         Past Medical History:   Diagnosis Date   . Abdominal pain, right lower quadrant    .  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 Arthritis    . Bipolar disorder (HCC)    . Blood circulation, collateral    . Cancer (HCC)     skin cancer   . Diabetes mellitus (HCC)    . Hearing aid worn     bilateral   . Hernia    . Hyperlipidemia    . MVP (mitral valve prolapse)    . Neuromuscular disorder (HCC) Inguinal Nerve pain   . Pleurisy without mention of effusion or current tuberculosis    . Right groin pain 02/28/2014   . Stomach ulcer     as a child   . Torn rotator cuff     right shoulder   . Unspecified asthma    . Unspecified constipation    . Unspecified hemorrhoids without mention of complication        Family History   Problem Relation Age of Onset   . Alcohol Abuse Father    . Mental Illness Father    . Alcohol Abuse Mother    . Mental Illness Mother    . Hypertension Mother    . Alcohol Abuse Brother    . Cancer Paternal Grandfather    . Mental Illness Maternal Grandmother    . Hypertension Maternal Grandmother    . Mental Illness Daughter    . Colon Cancer Neg Hx    . Colon Polyps Neg Hx    . Liver Cancer Neg Hx    . Stomach Cancer Neg Hx    . Ulcerative Colitis Neg Hx    . Crohn's Disease Neg Hx     . Liver Disease Neg Hx    . Rectal Cancer Neg Hx        Social History   Substance Use Topics   . Smoking status: Never Smoker   . Smokeless tobacco: Never Used      Comment: Disabled due bipolar/autism   . Alcohol use No           Review of Systems   Constitutional: Negative for chills, fatigue and fever.   HENT: Negative.    Eyes: Negative.    Respiratory: Negative for apnea, shortness of breath and wheezing.    Cardiovascular: Negative.    Gastrointestinal: Positive for abdominal pain. Negative for abdominal distention, constipation, diarrhea, nausea and vomiting.   Endocrine: Negative for polydipsia, polyphagia and polyuria.   Genitourinary: Negative.    Musculoskeletal: Positive for arthralgias and back pain.   Skin: Negative.    Allergic/Immunologic: Negative.    Neurological: Negative for dizziness, seizures and headaches.   Hematological: Negative.    Psychiatric/Behavioral: Negative.        Objective:   Physical Exam   Constitutional: She is oriented to person, place, and time. She appears well-developed and well-nourished. No distress.   BP 130/68 (Site: Left Arm, Position: Sitting, Cuff Size: Large Adult)  Temp 98.9 F (37.2 C) (Temporal)   Ht 5\' 5"  (1.651 m)     HENT:   Head: Normocephalic and atraumatic.   Mouth/Throat: Oropharynx is clear and moist. No oropharyngeal exudate.   Eyes: Conjunctivae and EOM are normal. Pupils are equal, round, and reactive to light. No scleral icterus.   Neck: Normal range of motion. Neck supple. No JVD present. No tracheal deviation present. No thyromegaly present.   Cardiovascular: Normal rate, regular rhythm and intact distal pulses.  Exam reveals no gallop and no friction rub.    No murmur  heard.  Pulmonary/Chest: Effort normal and breath sounds normal. No respiratory distress. She has no wheezes. She has no rales.   Abdominal: Soft. Bowel sounds are normal. She exhibits no distension and no mass. There is no tenderness. There is no rebound and no guarding.  Hernia confirmed negative in the right inguinal area and confirmed negative in the left inguinal area.   Musculoskeletal: Normal range of motion. She exhibits no edema or tenderness.   Lymphadenopathy:     She has no cervical adenopathy.        Right: No inguinal adenopathy present.        Left: No inguinal adenopathy present.   Neurological: She is alert and oriented to person, place, and time. She exhibits normal muscle tone.   Skin: Skin is warm and dry. No rash noted. No erythema.   Psychiatric: She has a normal mood and affect. Her behavior is normal. Judgment and thought content normal.       Assessment:      Right Groin pain - likely from groin strain - ? radiculopathy      Plan:      Will send her for a CT of the pelvis for evaluation. She is to f/u after the CT has been completed.       Electronically signed by Fidel Levy, PA-C on 09/24/15 at 9:37 PM

## 2015-09-14 ENCOUNTER — Inpatient Hospital Stay: Admit: 2015-09-14 | Payer: BLUE CROSS/BLUE SHIELD | Primary: Emergency Medicine

## 2015-09-14 DIAGNOSIS — M5116 Intervertebral disc disorders with radiculopathy, lumbar region: Secondary | ICD-10-CM

## 2015-09-14 MED ORDER — GADOPENTETATE DIMEGLUMINE 469.01 MG/ML IV SOLN
469.01 MG/ML | Freq: Once | INTRAVENOUS | Status: AC | PRN
Start: 2015-09-14 — End: 2015-09-14
  Administered 2015-09-14: 13:00:00 20 mL via INTRAVENOUS

## 2015-09-15 NOTE — Telephone Encounter (Signed)
Patient calling to find out when CT scan will be scheduled    Cc: Joyce Cohen

## 2015-09-15 NOTE — Telephone Encounter (Signed)
Forgot to order CT scan.  Currently orders placed  Tim

## 2015-09-20 NOTE — Telephone Encounter (Signed)
Called and left message with patient to contact us regarding scheduling her CT

## 2015-09-21 NOTE — Telephone Encounter (Signed)
Left message with patient regarding Ct scan scheduled for 09/22/15 at 8:45 at [place], patient instructed to be npo after midnight

## 2015-09-22 NOTE — Telephone Encounter (Signed)
Called patient again and reached her husband, appt rescheduled for Monday August 7th at 9:00 at [place], patient to be npo after midnight.  VM on cellphone was full, and husbands phone was not receiving calls.  Sent my phone number as a call back number.

## 2015-09-22 NOTE — Telephone Encounter (Signed)
Patient calls regarding CT Scan appointment.  (Did not get message from yesterday)  Asks you to please call.  Thank you.

## 2015-09-22 NOTE — Telephone Encounter (Signed)
Called and spoke with patient's husband and gave details for appt  At [place] on Monday

## 2015-09-22 NOTE — Telephone Encounter (Signed)
Tried calling patient back, vm full and could not leave message

## 2015-10-04 ENCOUNTER — Encounter

## 2015-10-04 ENCOUNTER — Inpatient Hospital Stay
Admit: 2015-10-04 | Discharge: 2015-10-04 | Payer: BLUE CROSS/BLUE SHIELD | Attending: Pain Medicine | Primary: Emergency Medicine

## 2015-10-04 ENCOUNTER — Inpatient Hospital Stay: Admit: 2015-10-04 | Payer: BLUE CROSS/BLUE SHIELD | Primary: Emergency Medicine

## 2015-10-04 DIAGNOSIS — G9050 Complex regional pain syndrome I, unspecified: Secondary | ICD-10-CM

## 2015-10-04 DIAGNOSIS — M5136 Other intervertebral disc degeneration, lumbar region: Secondary | ICD-10-CM

## 2015-10-04 MED ORDER — SODIUM CHLORIDE 0.9 % IJ SOLN
0.9 | INTRAMUSCULAR | Status: AC
Start: 2015-10-04 — End: 2015-10-04

## 2015-10-04 MED ORDER — BUPIVACAINE HCL (PF) 0.25 % IJ SOLN
0.25 | INTRAMUSCULAR | Status: AC
Start: 2015-10-04 — End: 2015-10-04

## 2015-10-04 MED ORDER — MIDAZOLAM HCL 2 MG/2ML IJ SOLN
2 MG/ML | Freq: Once | INTRAMUSCULAR | Status: AC | PRN
Start: 2015-10-04 — End: 2015-10-04
  Administered 2015-10-04: 14:00:00 2 via INTRAVENOUS

## 2015-10-04 MED ORDER — MIDAZOLAM HCL 2 MG/2ML IJ SOLN
2 | INTRAMUSCULAR | Status: AC
Start: 2015-10-04 — End: 2015-10-04

## 2015-10-04 MED ORDER — SODIUM CHLORIDE 0.9 % IJ SOLN
0.9 % | Freq: Once | INTRAMUSCULAR | Status: AC | PRN
Start: 2015-10-04 — End: 2015-10-04
  Administered 2015-10-04: 14:00:00 4

## 2015-10-04 MED ORDER — BUPIVACAINE HCL (PF) 0.25 % IJ SOLN
0.25 % | Freq: Once | INTRAMUSCULAR | Status: AC | PRN
Start: 2015-10-04 — End: 2015-10-04
  Administered 2015-10-04: 14:00:00 5 via EPIDURAL

## 2015-10-04 MED ORDER — IOHEXOL 240 MG/ML IJ SOLN
240 MG/ML | Freq: Once | INTRAMUSCULAR | Status: AC | PRN
Start: 2015-10-04 — End: 2015-10-04
  Administered 2015-10-04: 14:00:00 2 via EPIDURAL

## 2015-10-04 MED ORDER — METHYLPREDNISOLONE ACETATE 80 MG/ML IJ SUSP
80 MG/ML | Freq: Once | INTRAMUSCULAR | Status: AC | PRN
Start: 2015-10-04 — End: 2015-10-04
  Administered 2015-10-04: 14:00:00 80 via EPIDURAL

## 2015-10-04 MED ORDER — METHYLPREDNISOLONE ACETATE 80 MG/ML IJ SUSP
80 | INTRAMUSCULAR | Status: AC
Start: 2015-10-04 — End: 2015-10-04

## 2015-10-04 MED ORDER — LIDOCAINE-EPINEPHRINE 1 %-1:200000 IJ SOLN
1-1200000 percent-:200000 | Freq: Once | INTRAMUSCULAR | Status: AC | PRN
Start: 2015-10-04 — End: 2015-10-04
  Administered 2015-10-04: 14:00:00 2 via EPIDURAL

## 2015-10-04 MED ORDER — LIDOCAINE HCL (PF) 1 % IJ SOLN
1 % | Freq: Once | INTRAMUSCULAR | Status: AC | PRN
Start: 2015-10-04 — End: 2015-10-04
  Administered 2015-10-04: 14:00:00 3 via SUBCUTANEOUS

## 2015-10-04 MED FILL — SENSORCAINE-MPF 0.25 % IJ SOLN: 0.25 % | INTRAMUSCULAR | Qty: 10

## 2015-10-04 MED FILL — MIDAZOLAM HCL 2 MG/2ML IJ SOLN: 2 MG/ML | INTRAMUSCULAR | Qty: 2

## 2015-10-04 MED FILL — SODIUM CHLORIDE 0.9 % IJ SOLN: 0.9 % | INTRAMUSCULAR | Qty: 10

## 2015-10-04 MED FILL — DEPO-MEDROL 80 MG/ML IJ SUSP: 80 MG/ML | INTRAMUSCULAR | Qty: 1

## 2015-10-04 NOTE — Progress Notes (Shared)
Procedure:  Level of Consciousness: [x] Alert [x] Oriented [] Disoriented [] Lethargic  Anxiety Level: [x] Calm [] Anxious [] Depressed [] Other  Skin: [x] Warm [] Dry [] Cool [] Moist [] Intact [] Other  Cardiovascular: [] Palpitations: [x] Never [] Occasionally [] Frequently  Chest Pain: [x] No [] Yes  Respiratory:  [x] Unlabored [] Labored [] Cough ([]  Productive [] Unproductive)  HCG Required: [x] No [] Yes   Results: [] Negative [] Positive  Knowledge Level:        [x] Patient/Other verbalized understanding of pre-procedure instructions.        [x] Assessment of post-op care needs (transportation, responsible caregiver)        [x] Able to discuss health care problems and how to deal with it.  Factors that Affect Teaching:        Language Barrier: [x] No [] Yes - why:        Hearing Loss:        [x] No [] Yes            Corrective Device:  [] Yes [] No        Vision Loss:           [x] No [x] Yes            Corrective Device:  [x] Yes [] No        Memory Loss:       [] No [x] Yes            [x] Short Term [] Long Term  Motivational Level:  [x] Asks Questions                  [] Extremely Anxious       [x] Seems Interested               [] Seems Uninterested                  [x] Denies need for Education  Risk for Injury:  [x] Patient oriented to person, place and time  [] History of frequent falls/loss of balance  Nutritional:  [] Change in appetite   [] Weight Gain   [] Weight Loss  Functional:  [] Requires assistance with ADL'sNursing Admission Record    Current Issues / Falls / ER Visits:  Yes     Percentage of Pain Relief after Last Procedure:  100 %    How long lasted:  2.63months    Radiology exams received during the last 12 months: Yes       [facility]       Imaging on chart: Yes         Imaging records requested: Yes  MRI exams received in the past 2 years:  Yes  Physical therapy during the last 6 months: No       Labs during the last 12 months: No    Education Provided:  [x]  Review of [facility]  [x]   Agreement Review  []  Compliance Issues Discussed    []  Cognitive Behavior Needs [x]  Exercise []  Review of Test []  Financial Issues  []  Tobacco/Alcohol Use [x]  Teaching []  New Patient []  Picture Obtained    Physician Plan:  []  Outgoing Referral  []  Pharmacy Consult  []  Test Ordered   []  Obtained Test Results / Consult Notes  []  UDS due at next visit, verified per EPIC      []  Suspected Physical Abuse or Suicide Risk assessed - IF YES COMPLETE QUESTIONS BELOW    If  any of the following questions are answered yes - contact attending physician for referral:    Has been considering harming self to escape stress, pain problems?  []  YES  []  NO  Has a suicide plan? []  YES  []  NO  Has attempted suicide in the past?   []  YES  []  NO  Has a close friend or family member who committed suicide?  []  YES  []  NO    Patient Referred To :     Additional Notes:    Assessment Completed by:  Electronically signed by Newt Lukes, RN on 10/04/2015 at 8:48 AM

## 2015-10-04 NOTE — Discharge Instructions (Signed)
Dr. Riley Love  Discharge Instructions / Information        You have had the procedure(s) called:    [] Cervical Epidural     [x] Lumbar Epidural     [] _____________Injection  [] Cervical Facets  [] Lumbar Facets [] Radiofrequency Lesioning  [] Occipital Nerve Blocks [] Caudal Epidural [] Transforaminal Epidural  [] Trigger Point Injections [] SI Joint Injection [] ______________________    Please follow these instructions carefully.      If you have questions or problems you may call [phone].     You have received the following medications:  [x] Lidocaine [x] Bupivicaine: [] 0.50%   [x] 0.25% [x] DepoMedrol   [x] Normal Saline  [x] Versed [] Sodium Bicarbonate   [] Isovue   [] Botox  [] Kenalog   [] Diprovan  [x] omnipaque_____________________    PATIENT INFORMATION:  You may experience the following symptoms after your procedure.  These symptoms are normal and should not cause alarm.     An increase in our pain may occurr.  This may last 24-48 hours after your procedure.   No change in pain.   Weakness or numbness in your affected extremity.  This will usually only last a few hours.    REMEMBER TO REPORT THE FOLLOWING SYMPTOMS TO YOUR DOCTOR:   Redness, swelling. Or drainage at the injection site.   Unusual pain that interferes with your usual activities of daily living.    OTHER INSTRUCTIONS:  [x] I will apply ice to the injection site for 24 hours.  Apply 15-2- minutes and take off 40 minutes.  [x] I understand if a steroid was used it will take effect in 3-5 days.  [x] I have received my personal belongings and valuable.  [x] I have received a copy of my discharge instructions and understand to my satisfaction.  [x] I will not drive for [] 12 hours    [x] 24 hours after my injections.  [x] I understand that today I will  [x] rest  [] walk and move freely  [] other _________________  [] Additional instructions:  ____________________________________________________________________________        Patient  Discharge:  [x] Home  [] Hospital  [] Other  ___________________________________    How:  [] Ambulatory  [x] Wheelchair   [] Stretcher   [] __________________________________    Accompanied by:  [] Family Member  [] Friend  [] Hospital Staff  [] Ambulance Staff    [] Other_____________________________

## 2015-10-04 NOTE — Procedures (Signed)
DATE: 10/04/2015      REASON FOR VISIT: Lumbar Degenerative Disc Disease, Lumbar Radiculopathy  PROCEDURE: Lumbar Epidural Steroid Injection.   []  Moderate Sedation    DESCRIPTION OF PROCEDURE:            ??  After obtaining informed consent, the patient was taken to the procedure room, positioned prone, and sterilely prepped.  The procedure was performed under fluoroscopic guidance.  First 5 ml of 1% Xylocaine was used at T12 - L1 for local anesthesia.  A 19-gauge Hustead needle was advanced to the epidural space.  The was confirmed on the fluoroscope with an injection of [x]  2ml  []  ml Contrast.  Then, [x]  5ml of 0.25% Marcaine, [x]  4ml of Normal Saline, and [x]  80 mg of Depo Medrol was gently injected.        There were no complications.    DIAGNOSES:  []  Low Back Pain  []  *Lumbar Radiculopathy  []  *Lumbar Degenerative Disc Disease  []  *Lumbar Spinal Stenosis  []  *Lumbar Postlaminectomy Syndrome  [x]  Other CRPS 1  Procedure:  Level of Consciousness: [x] Alert [x] Oriented [] Disoriented [] Lethargic  Anxiety Level: [x] Calm [] Anxious [] Depressed [] Other  Skin: [x] Warm [] Dry [] Cool [] Moist [] Intact [] Other  Cardiovascular: [] Palpitations: [x] Never [] Occasionally [] Frequently  Chest Pain: [x] No [] Yes  Respiratory:  [x] Unlabored [] Labored [] Cough ([]  Productive [] Unproductive)  HCG Required: [x] No [] Yes   Results: [] Negative [] Positive  Knowledge Level:        [x] Patient/Other verbalized understanding of pre-procedure instructions.        [x] Assessment of post-op care needs (transportation, responsible caregiver)        [x] Able to discuss health care problems and how to deal with it.  Factors that Affect Teaching:        Language Barrier: [x] No [] Yes - why:        Hearing Loss:        [x] No [] Yes            Corrective Device:  [] Yes [] No        Vision Loss:           [x] No [x] Yes            Corrective Device:  [x] Yes [] No        Memory Loss:       [] No [x] Yes            [x] Short Term [] Long Term  Motivational  Level:  [x] Asks Questions                  [] Extremely Anxious       [x] Seems Interested               [] Seems Uninterested                  [x] Denies need for Education  Risk for Injury:  [x] Patient oriented to person, place and time  [] History of frequent falls/loss of balance  Nutritional:  [] Change in appetite   [] Weight Gain   [] Weight Loss  Functional:    Nursing Admission Record??  Current Issues / Falls / ER Visits:  Yes ??  Percentage of Pain Relief after Last Procedure:  100 %    How long lasted:  2.535months??  Radiology exams received during the last 12 months: Yes       [facility]       Imaging on chart: Yes         Imaging records requested: Yes  MRI exams received in the past 2 years:  Yes  Physical therapy during the last 6 months: No       Labs during the last 12 months: No      COMMENTS:  Patient complains of low back and groin pain. Patient identifies pain at S2 region of her spine. Long history of coccyx pain. The groin pain is a separate issue. She saw Janett Billowim Thurston for a hernia consult.  Patient feels that she achieved 100% relief that has lasted her 2.5 months from last injections. Patient feels that her depression has increased, she has an appointment today with psychiatrist.Informed patient that she cannot do both in relation to her having sedation today. Patient states that she is going reschedule her psychiatric appointment. Patient denies any health changes and any changes with her psychotropic medications. Discussed with the patient about the correlation between depression and pain perception.   She is being treated by [facility] for this. Educated patient that with aging that everyone statistically experience degenerative disc changes. Discussed with the patient that with worsening symptoms that I will refer her for further imaging and possibly a surgical consult. Discussed the risk and benefits of procedure with patient. Will  proceed today with Lumbar Epidural Steroid Injection.    She had an Lumbar MRI and a pelvic CT. The MRI did not address the levels above T12. This can be ordered.  EXAM  AIRWAY ADEQUATE  LUNGS CLEAR AT AUSCULTATION  HEART RRR      PLAN:  [x]  Will return to office in  3 month(s)for  [x]  Planned Procedure  []  Office Visit  []  Prescriptions were given today   []  No prescriptions needed today  []  Patient is to call with any questions or concerns which may arise prior to the next office visit.  [x]  LESI T12-L1  []    []  Office Visit                        []  Over 50% of today's appointment was given to discussion, evaluation and counseling.

## 2015-10-04 NOTE — Progress Notes (Signed)
Patient Name: Joyce Cohen  DOB: 1956-01-30  MRN: [id number]    PRE-SEDATION ASSESSMENT    Procedure:  @PROCEDURE @  I have examined the patient's status immediately prior to the procedure.      BRIEF H&P    HPI/Changes/Indicators/Diagnosis  There are no active hospital problems to display for this patient.      Medications:  Prior to Admission medications    Medication Sig Start Date End Date Taking? Authorizing Provider   HUMALOG 100 UNIT/ML injection vial  09/01/15   Historical Provider, MD   Lancets MISC  08/31/15   Historical Provider, MD   traMADol (ULTRAM) 50 MG tablet Take 1 tablet by mouth every 8 hours as needed for Pain One month supply 06/29/15   Beatrix Fettersiley D Gittel Mccamish, MD   NONFORMULARY Arthritis hand ointment    Historical Provider, MD   albuterol sulfate HFA (VENTOLIN HFA) 108 (90 BASE) MCG/ACT inhaler Inhale 2 puffs into the lungs every 4 hours as needed for Wheezing 04/09/15   Lyndee Hensenobert Drish, APRN   Ospemifene 60 MG TABS Take 1 tablet by mouth daily 03/27/15   Gaye AlkenLisa C Lasher, MD   QUEtiapine (SEROQUEL) 400 MG tablet Take 400 mg by mouth 2 times daily    Historical Provider, MD   carbidopa-levodopa (SINEMET) 10-100 MG per tablet Take 1 tablet by mouth 3 times daily    Historical Provider, MD   montelukast (SINGULAIR) 10 MG tablet Take 10 mg by mouth nightly    Historical Provider, MD   vitamin D (ERGOCALCIFEROL) 50000 UNITS CAPS capsule  07/13/14   Historical Provider, MD   ranitidine (ZANTAC) 150 MG tablet  07/13/14   Historical Provider, MD   traZODone (DESYREL) 100 MG tablet Take 1 tablet by mouth nightly as needed for Sleep  Patient taking differently: Take 300 mg by mouth nightly  05/16/14   Maylene RoesSuzanne Russ Yoder, MD   buPROPion SR [facility](WELLBUTRIN SR) 150 MG SR tablet Take 1 tablet by mouth daily 05/16/14   Maylene RoesSuzanne Russ Yoder, MD   OXcarbazepine (TRILEPTAL) 150 MG tablet Take 1 tablet by mouth 2 times daily 05/16/14   Maylene RoesSuzanne Russ Yoder, MD   lubiprostone (AMITIZA) 24 MCG capsule Take 1 capsule by mouth 2 times daily (with  meals) 03/17/14   Percival SpanishKristie Hack, APRN   glipiZIDE (GLUCOTROL) 10 MG tablet Take 1 tablet by mouth every morning (before breakfast) 02/07/14   Jeanene ErbLaurie Kay Ballew, DO       Allergies:  is allergic to tobacco [nicotiana tabacum]; decongestant [pseudoephedrine hcl]; motrin [ibuprofen]; and cabbage.    Vital Signs:  Vitals:    10/04/15 0900   BP: (!) 158/72   Pulse: 92   Resp: 18   Temp: 97.8 ??F (36.6 ??C)   SpO2: 91%       Physical Exam:  Cardiac:                                    [x] WNL                    [] Comments:    Pulmonary:                               [x] WNL                    [] Comments:    Neuro/Mental Status:               [  x]WNL                    '[]'$ Comments:      Informed Consent:  The risks and benefits of the procedure have been discussed with either the patient or if they cannot consent their representative.    Assessment:  Patient examined and appropriate for the planned sedation and procedure.    Plan:  Proceed with planned sedation and procedure as above.     Mallampati Airway Assessment: Adequate      ASA STATUS:  '[]'$ 1. Normal Healty  '[]'$ 2. Mild Systemice Disease, doesn't limit activity eg HTN, mild DM  '[x]'$ 3. Severe Systemic Disease, does limit activity eg stable angina, DM with vascular         disease          '[]'$ 4.Severe Systemic Disease constant threat eg CHF, renal failure  '[]'$ 5. Moribund not expected to survive without procedure          Fanny Skates, RN

## 2015-10-06 ENCOUNTER — Inpatient Hospital Stay
Admission: EM | Admit: 2015-10-06 | Discharge: 2015-10-11 | Disposition: A | Discharge status: Home / Self Care | Payer: BLUE CROSS/BLUE SHIELD | Source: Other Acute Inpatient Hospital | Admitting: Psychiatry

## 2015-10-06 ENCOUNTER — Emergency Department: Admit: 2015-10-06 | Payer: BLUE CROSS/BLUE SHIELD | Primary: Emergency Medicine

## 2015-10-06 DIAGNOSIS — F3164 Bipolar disorder, current episode mixed, severe, with psychotic features: Secondary | ICD-10-CM

## 2015-10-06 LAB — URINALYSIS
Blood, Urine: NEGATIVE
Glucose, Ur: NEGATIVE mg/dL
Nitrite, Urine: NEGATIVE
Protein, UA: 100 mg/dL — AB
Specific Gravity, UA: 1.023 (ref 1.005–1.030)
Urobilinogen, Urine: 1 E.U./dL (ref <2.0)
pH, UA: 6 (ref 5.0–8.0)

## 2015-10-06 LAB — COMPREHENSIVE METABOLIC PANEL
ALT: 35 U/L — ABNORMAL HIGH (ref 5–33)
AST: 15 U/L (ref 5–32)
Albumin: 4.3 g/dL (ref 3.5–5.2)
Alkaline Phosphatase: 88 U/L (ref 35–104)
Anion Gap: 15 mmol/L (ref 7–19)
BUN: 18 mg/dL (ref 6–20)
CO2: 19 mmol/L — ABNORMAL LOW (ref 22–29)
Calcium: 9.6 mg/dL (ref 8.6–10.0)
Chloride: 104 mmol/L (ref 98–111)
Creatinine: 1.1 mg/dL — ABNORMAL HIGH (ref 0.5–0.9)
GFR Non-African American: 51 — AB (ref >60)
Glucose: 153 mg/dL — ABNORMAL HIGH (ref 74–109)
Potassium: 4 mmol/L (ref 3.5–5.0)
Sodium: 138 mmol/L (ref 136–145)
Total Bilirubin: 0.3 mg/dL (ref 0.2–1.2)
Total Protein: 7.4 g/dL (ref 6.6–8.7)

## 2015-10-06 LAB — SALICYLATE LEVEL: Salicylate, Serum: <3 mg/dL (ref 3.0–10.0)

## 2015-10-06 LAB — CBC WITH AUTO DIFFERENTIAL
Basophils %: 1 % (ref 0.0–1.0)
Basophils Absolute: 0.1 10*3/uL (ref 0.00–0.20)
Eosinophils %: 1.1 % (ref 0.0–5.0)
Eosinophils Absolute: 0.1 10*3/uL (ref 0.00–0.60)
Hematocrit: 44.7 % (ref 37.0–47.0)
Hemoglobin: 15.2 g/dL (ref 12.0–16.0)
Lymphocytes %: 29.8 % (ref 20.0–40.0)
Lymphocytes Absolute: 3.6 10*3/uL (ref 1.1–4.5)
MCH: 29.9 pg (ref 27.0–31.0)
MCHC: 34 g/dL (ref 33.0–37.0)
MCV: 87.8 fL (ref 81.0–99.0)
MPV: 10.3 fL (ref 9.4–12.3)
Monocytes %: 7.8 % (ref 0.0–10.0)
Monocytes Absolute: 0.9 10*3/uL (ref 0.00–0.90)
Neutrophils %: 59.6 % (ref 50.0–65.0)
Neutrophils Absolute: 7.2 10*3/uL (ref 1.5–7.5)
Platelets: 220 10*3/uL (ref 130–400)
RBC: 5.09 M/uL (ref 4.20–5.40)
RDW: 12.1 % (ref 11.5–14.5)
WBC: 12.1 10*3/uL — ABNORMAL HIGH (ref 4.8–10.8)

## 2015-10-06 LAB — MAGNESIUM: Magnesium: 2.2 mg/dL (ref 1.6–2.6)

## 2015-10-06 LAB — ETHANOL: Ethanol Lvl: <10 mg/dL

## 2015-10-06 LAB — MICROSCOPIC URINALYSIS

## 2015-10-06 LAB — POCT GLUCOSE: POC Glucose: 232 mg/dl — ABNORMAL HIGH (ref 70–99)

## 2015-10-06 LAB — URINE DRUG SCREEN
Amphetamine Screen, Urine: POSITIVE — AB (ref <1000)
Barbiturate Screen, Ur: NEGATIVE (ref <200)
Benzodiazepine Screen, Urine: POSITIVE — AB (ref <100)
Cannabinoid Scrn, Ur: NEGATIVE (ref <50)
Cocaine Metabolite Screen, Urine: NEGATIVE (ref <300)
Opiate Scrn, Ur: NEGATIVE (ref <300)

## 2015-10-06 LAB — ACETAMINOPHEN LEVEL: Acetaminophen Level: <15 ug/mL

## 2015-10-06 MED ORDER — CARBIDOPA-LEVODOPA 10-100 MG PO TABS
10-100 MG | Freq: Every evening | ORAL | Status: DC
Start: 2015-10-06 — End: 2015-10-06

## 2015-10-06 MED ORDER — OXCARBAZEPINE 150 MG PO TABS
150 MG | Freq: Two times a day (BID) | ORAL | Status: DC
Start: 2015-10-06 — End: 2015-10-11
  Administered 2015-10-07 – 2015-10-11 (×10): 150 mg via ORAL

## 2015-10-06 MED ORDER — QUETIAPINE FUMARATE 100 MG PO TABS
100 MG | Freq: Every evening | ORAL | Status: DC
Start: 2015-10-06 — End: 2015-10-06

## 2015-10-06 MED ORDER — ACETAMINOPHEN 325 MG PO TABS
325 MG | ORAL | Status: DC | PRN
Start: 2015-10-06 — End: 2015-10-11

## 2015-10-06 MED ORDER — BUPROPION HCL ER (SR) 150 MG PO TB12
150 MG | Freq: Every day | ORAL | Status: DC
Start: 2015-10-06 — End: 2015-10-06

## 2015-10-06 MED ORDER — FAMOTIDINE 20 MG PO TABS
20 MG | Freq: Two times a day (BID) | ORAL | Status: DC
Start: 2015-10-06 — End: 2015-10-11
  Administered 2015-10-07 – 2015-10-11 (×10): 20 mg via ORAL

## 2015-10-06 MED ORDER — VITAMIN D (ERGOCALCIFEROL) 1.25 MG (50000 UT) PO CAPS
1.25 MG (50000 UT) | ORAL | Status: DC
Start: 2015-10-06 — End: 2015-10-11

## 2015-10-06 MED ORDER — GLIPIZIDE 10 MG PO TABS
10 MG | Freq: Every day | ORAL | Status: DC
Start: 2015-10-06 — End: 2015-10-11
  Administered 2015-10-07 – 2015-10-11 (×5): 10 mg via ORAL

## 2015-10-06 MED ORDER — MONTELUKAST SODIUM 10 MG PO TABS
10 MG | Freq: Every evening | ORAL | Status: DC
Start: 2015-10-06 — End: 2015-10-11
  Administered 2015-10-08 – 2015-10-11 (×4): 10 mg via ORAL

## 2015-10-06 MED ORDER — ALBUTEROL SULFATE HFA 108 (90 BASE) MCG/ACT IN AERS
108 (90 Base) MCG/ACT | RESPIRATORY_TRACT | Status: DC | PRN
Start: 2015-10-06 — End: 2015-10-11
  Administered 2015-10-09: 13:00:00 2 via RESPIRATORY_TRACT

## 2015-10-06 MED ORDER — OSPEMIFENE 60 MG PO TABS
60 MG | Freq: Every day | ORAL | Status: DC
Start: 2015-10-06 — End: 2015-10-06

## 2015-10-06 MED ORDER — MAGNESIUM HYDROXIDE 400 MG/5ML PO SUSP
400 MG/5ML | Freq: Every day | ORAL | Status: DC | PRN
Start: 2015-10-06 — End: 2015-10-11
  Administered 2015-10-09: 13:00:00 30 mL via ORAL

## 2015-10-06 MED ORDER — TRAZODONE HCL 100 MG PO TABS
100 MG | Freq: Every evening | ORAL | Status: DC
Start: 2015-10-06 — End: 2015-10-06

## 2015-10-06 MED ORDER — TRAMADOL HCL 50 MG PO TABS
50 MG | Freq: Three times a day (TID) | ORAL | Status: DC | PRN
Start: 2015-10-06 — End: 2015-10-06

## 2015-10-06 MED FILL — VENTOLIN HFA 108 (90 BASE) MCG/ACT IN AERS: 108 (90 Base) MCG/ACT | RESPIRATORY_TRACT | Qty: 8

## 2015-10-06 NOTE — ED Notes (Signed)
Scott, with BH at bedside evaluating PT.     Charolett BumpersHannah Potts, RN  10/06/15 [phone]

## 2015-10-06 NOTE — Progress Notes (Signed)
[facility]  Admission Note     Admission Type:   Admission Type: Voluntary    Reason for admission:  Reason for Admission: depression, anxiety, Bipolar I with mania    PATIENT STRENGTHS:  Strengths: Communication    Patient Strengths and Limitations:  Strengths: Independent in basic self-care activities       Addictive Behavior:   Addictive Behavior  In the past 3 months, have you felt or has someone told you that you have a problem with:  : None  Do you have a history of Chemical Use?: No  Do you have a history of Alcohol Use?: No  Do you have a history of Street Drug Abuse?: No  Histroy of Prescripton Drug Abuse?: No    Medical Problems:   Past Medical History:   Diagnosis Date   ??? Abdominal pain, right lower quadrant    ??? 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 Arthritis    ??? Bipolar disorder (HCC)    ??? Blood circulation, collateral    ??? Cancer (HCC)     skin cancer   ??? Diabetes mellitus (HCC)    ??? Hearing aid worn     bilateral   ??? Hernia    ??? Hyperlipidemia    ??? MVP (mitral valve prolapse)    ??? Neuromuscular disorder (HCC) Inguinal Nerve pain   ??? Pleurisy without mention of effusion or current tuberculosis    ??? Right groin pain 02/28/2014   ??? Stomach ulcer     as a child   ??? Torn rotator cuff     right shoulder   ??? Unspecified asthma    ??? Unspecified constipation    ??? Unspecified hemorrhoids without mention of complication        Status EXAM:  Status and Exam  Normal: No  Facial Expression: Avoids Gaze, Flat, Sad, Worried  Affect: Blunt, Constricted  Level of Consciousness: Alert  Mood:Normal: No  Mood: Depressed, Anxious, Labile, Sad, Irritable  Motor Activity:Normal: No  Motor Activity: Decreased  Interview Behavior: Cooperative  Preception: Orient to Person, Orient to Time, Orient to Place, Orient to Situation  Attention:Normal: No  Thought Processes: Blocking, Circumstantial  Thought Content:Normal: No  Thought Content: Poverty of Content, Preoccupations  Hallucinations:  None  Delusions: No  Memory:Normal: No  Memory: Poor Recent, Poor Remote  Insight and Judgment: No  Insight and Judgment: Poor Judgment, Poor Insight  Present Suicidal Ideation: No  Present Homicidal Ideation: No    Pt admitted with followings belongings:  Dentures: None  Vision - Corrective Lenses: Contacts, Glasses  Hearing Aid: None  Jewelry: None  Body Piercings Removed: N/A  Clothing: Socks, Undergarments (Comment), Footwear, Shirt, Pants  Were All Patient Medications Collected?: No  Other Valuables: None (insurance and drivers license in glass case)     Valuables sent home with HUSBAND. Valuables placed in safe in security envelope, number:  IN SAFE. Patient's home medications were SENT HOME WITH HUSBAND.  Patient oriented to surroundings and program expectations and copy of patient rights given. Received admission packet:  YES.  Consents reviewed, signed YES. Refused NO. Patient verbalize understanding:  YES.    Patient education on precautions: YES    PT C[facility] TO UNIT AT 1624 [facility]  Pt oriented to room and unit, ate supper, oriented x4, lungs diminished, wheezing, pt has asthma, bowel sounds in all four quadrants, pt had MB 10/06/2015, pt states no SI,HI or  AVH, pt states that she has urges "to want to hit her head, Im not cutting myself anymore, I cannot sleep and have no appetite to eat. Lost over 10 pounds in last month, no ADL or clean house except for today. I have been feeling very depressed or anxious. Pt stated elevated depresssion and anxiety. Affect is labile, constricted, behavior is cooperative. Will continue to monitor                   Hortencia ConradiLora A Dell Briner, RN

## 2015-10-06 NOTE — ED Notes (Signed)
Report to Vernona RiegerLaura, Charity fundraiserN.     Charolett BumpersHannah Potts, RN  10/06/15 [phone]

## 2015-10-06 NOTE — ED Notes (Signed)
Report to Parker Hannifinaylor RN.     Charolett BumpersHannah Potts, RN  10/06/15 [phone]

## 2015-10-06 NOTE — H&P (Signed)
Initial Psychiatric Evaluation Completed. Job # [id number].

## 2015-10-06 NOTE — Plan of Care (Signed)
Problem: Altered Mood, Depressive Behavior  Goal: LTG-Able to verbalize acceptance of life and situations over which he or she has no control  Outcome: Ongoing  Goal: LTG-Able to verbalize and/or display a decrease in depressive symptoms  Outcome: Ongoing  Goal: STG-Able to verbalize suicidal ideations  Outcome: Ongoing  Goal: STG-Able to verbalize support system  Outcome: Ongoing  Goal: LTG-Absence of self-harm  Outcome: Ongoing  Goal: Patient Specific Goal  Outcome: Ongoing    Problem: Falls - Risk of:  Goal: Will remain free from falls  Will remain free from falls  Outcome: Ongoing  Goal: Absence of physical injury  Absence of physical injury  Outcome: Ongoing

## 2015-10-06 NOTE — H&P (Signed)
Initial Psychiatric Evaluation completed and dictated. Job [id number].
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
## 2015-10-06 NOTE — ED Notes (Signed)
Awaiting MD Benjaman KindlerNowak to complete bed request.     Charolett BumpersHannah Potts, RN  10/06/15 [phone]

## 2015-10-06 NOTE — ED Notes (Signed)
PT presents to ED c/o anxiety and depression. PT denies si/hi. PT states she has been biting her fingers and hitting herself in the head recently.     Charolett BumpersHannah Potts, RN  10/06/15 1247

## 2015-10-06 NOTE — ED Notes (Signed)
Scott with psych intake called to notify that patient is ready for evaluation @ 1346      McDonald Marchelle Folksmanda  10/06/15 1346

## 2015-10-06 NOTE — ED Provider Notes (Signed)
[facility] EMERGENCY DEPT  eMERGENCY dEPARTMENT eNCOUnter      Pt Name: Joyce Cohen  MRN: [id number]  Birthdate 02/01/1956  Date of evaluation: 10/06/2015  Provider: Gordy ClementMarie Samarah Hogle, MD    CHIEF COMPLAINT       Chief Complaint   Patient presents with   . Mental Health Problem     denies si/hi   . Anxiety         HISTORY OF PRESENT ILLNESS   (Location/Symptom, Timing/Onset, Context/Setting, Quality, Duration, Modifying Factors, Severity)  Note limiting factors.   Joyce Cohen is a 60 y.o. female who presents to the emergency department complaining of anxiety. Patient states she usually has a problem with depression but now is more anxious and depressed. Denies suicide intent but states she has harmful to herself hitting her head repeatedly.     The history is provided by the patient and the spouse.       Nursing Notes were reviewed.    REVIEW OF SYSTEMS    (2-9 systems for level 4, 10 or more for level 5)     Review of Systems   All other systems reviewed and are negative.           PAST MEDICAL HISTORY     Past Medical History:   Diagnosis Date   . Abdominal pain, right lower quadrant    . 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 Arthritis    . Bipolar disorder (HCC)    . Blood circulation, collateral    . Cancer (HCC)     skin cancer   . Diabetes mellitus (HCC)    . Hearing aid worn     bilateral   . Hernia    . Hyperlipidemia    . MVP (mitral valve prolapse)    . Neuromuscular disorder (HCC) Inguinal Nerve pain   . Pleurisy without mention of effusion or current tuberculosis    . Right groin pain 02/28/2014   . Stomach ulcer     as a child   . Torn rotator cuff     right shoulder   . Unspecified asthma    . Unspecified constipation    . Unspecified hemorrhoids without mention of complication          SURGICAL HISTORY       Past Surgical History:   Procedure Laterality Date   . ABDOMEN SURGERY     . ABDOMINAL EXPLORATION SURGERY      times two due to adhesions   . APPENDECTOMY     . CHOLECYSTECTOMY,  LAPAROSCOPIC N/A 02/24/2015    CHOLECYSTECTOMY LAPAROSCOPIC [facility]sey J [facility]   . CHOLECYSTECTOMY, LAPAROSCOPIC     . COLON SURGERY  04/2013    1 polyp removed   . COLONOSCOPY  05/19/11    Dr Renato Gailseed   . COLONOSCOPY  07/15/2012    Dr. Zollie BeckersHendon   . HYSTERECTOMY     . ROTATOR CUFF REPAIR Right    . SKIN BIOPSY  L and R wrists   . TONSILLECTOMY     . TUBAL LIGATION     . WISDOM TOOTH EXTRACTION           CURRENT MEDICATIONS       Previous Medications    ALBUTEROL SULFATE HFA (VENTOLIN HFA) 108 (90 BASE) MCG/ACT INHALER    Inhale 2 puffs into the lungs every 4 hours as needed for Wheezing  BUPROPION SR (WELLBUTRIN SR) 150 MG SR TABLET    Take 1 tablet by mouth daily    CARBIDOPA-LEVODOPA (SINEMET) 10-100 MG PER TABLET    Take 1 tablet by mouth 3 times daily    GLIPIZIDE (GLUCOTROL) 10 MG TABLET    Take 1 tablet by mouth every morning (before breakfast)    HUMALOG 100 UNIT/ML INJECTION VIAL        LANCETS MISC        LUBIPROSTONE (AMITIZA) 24 MCG CAPSULE    Take 1 capsule by mouth 2 times daily (with meals)    MONTELUKAST (SINGULAIR) 10 MG TABLET    Take 10 mg by mouth nightly    NONFORMULARY    Arthritis hand ointment    OSPEMIFENE 60 MG TABS    Take 1 tablet by mouth daily    OXCARBAZEPINE (TRILEPTAL) 150 MG TABLET    Take 1 tablet by mouth 2 times daily    QUETIAPINE (SEROQUEL) 400 MG TABLET    Take 400 mg by mouth 2 times daily    RANITIDINE (ZANTAC) 150 MG TABLET        TRAMADOL (ULTRAM) 50 MG TABLET    Take 1 tablet by mouth every 8 hours as needed for Pain One month supply    TRAZODONE (DESYREL) 100 MG TABLET    Take 1 tablet by mouth nightly as needed for Sleep    VITAMIN D (ERGOCALCIFEROL) 50000 UNITS CAPS CAPSULE           ALLERGIES     Tobacco [nicotiana tabacum]; Decongestant [pseudoephedrine hcl]; Motrin [ibuprofen]; and Cabbage    FAMILY HISTORY       Family History   Problem Relation Age of Onset   . Alcohol Abuse Father    . Mental Illness Father    . Alcohol Abuse Mother    . Mental  Illness Mother    . Hypertension Mother    . Alcohol Abuse Brother    . Cancer Paternal Grandfather    . Mental Illness Maternal Grandmother    . Hypertension Maternal Grandmother    . Mental Illness Daughter    . Colon Cancer Neg Hx    . Colon Polyps Neg Hx    . Liver Cancer Neg Hx    . Stomach Cancer Neg Hx    . Ulcerative Colitis Neg Hx    . Crohn's Disease Neg Hx    . Liver Disease Neg Hx    . Rectal Cancer Neg Hx           SOCIAL HISTORY       Social History     Social History   . Marital status: Married     Spouse name: Johnnie   . Number of children: 2   . Years of education: 73     Social History Main Topics   . Smoking status: Never Smoker   . Smokeless tobacco: Never Used      Comment: Disabled due bipolar/autism   . Alcohol use No   . Drug use: No   . Sexual activity: Not Asked     Other Topics Concern   . None     Social History Narrative       SCREENINGS             PHYSICAL EXAM    (up to 7 for level 4, 8 or more for level 5)   ED Triage Vitals   BP Temp Temp Source Pulse Resp SpO2 Height  Weight   10/06/15 1248 10/06/15 1248 10/06/15 1248 10/06/15 1248 10/06/15 1248 10/06/15 1248 10/06/15 1248 10/06/15 1248   139/80 99.2 F (37.3 C) Oral 93 18 96 % 5\' 5"  (1.651 m) 210 lb (95.3 kg)       Physical Exam   Constitutional: She is oriented to person, place, and time. She appears well-developed and well-nourished.   obese   HENT:   Head: Normocephalic and atraumatic.   Eyes: Conjunctivae are normal.   Neck: Neck supple.   Cardiovascular: Normal rate and regular rhythm.    Pulmonary/Chest: Effort normal and breath sounds normal.   Abdominal: Soft. Bowel sounds are normal.   Musculoskeletal: Normal range of motion.   Neurological: She is alert and oriented to person, place, and time.   Skin: Skin is warm and dry.   Psychiatric: Her mood appears anxious. She exhibits a depressed mood. She expresses no suicidal plans and no homicidal plans.   Nursing note and vitals reviewed.      DIAGNOSTIC RESULTS     EKG: All  EKG's are interpreted by the Emergency Department Physician who either signs or Co-signs this chart in the absence of a cardiologist.        RADIOLOGY:   Non-plain film images such as CT, Ultrasound and MRI are read by the radiologist. Plain radiographic images are visualized and preliminarily interpreted by the emergency physician with the below findings:          CT Head WO Contrast   Final Result   No acute intracranial abnormality.   This study may not rule out an acute nonhemorrhagic infarction. If   clinically warranted please obtain MR imaging of the brain.   The above findings are recorded on a digital voice clip in PACS.   Signed by Dr Nila Nephew on 10/06/2015 1:31 PM              LABS:  Labs Reviewed   COMPREHENSIVE METABOLIC PANEL - Abnormal; Notable for the following:        Result Value    CO2 19 (*)     Glucose 153 (*)     CREATININE 1.1 (*)     GFR Non-African American 51 (*)     ALT 35 (*)     All other components within normal limits   CBC WITH AUTO DIFFERENTIAL - Abnormal; Notable for the following:     WBC 12.1 (*)     All other components within normal limits   URINALYSIS - Abnormal; Notable for the following:     Clarity, UA CLOUDY (*)     Bilirubin Urine SMALL (*)     Ketones, Urine TRACE (*)     Protein, UA 100 (*)     Leukocyte Esterase, Urine TRACE (*)     All other components within normal limits   URINE DRUG SCREEN - Abnormal; Notable for the following:     Amphetamine Screen, Urine Positive (*)     Benzodiazepine Screen, Urine Positive (*)     All other components within normal limits   MICROSCOPIC URINALYSIS - Abnormal; Notable for the following:     WBC, UA 3-5 (*)     Bacteria, UA 1+ (*)     Yeast, UA Rare (*)     All other components within normal limits   URINE CULTURE   MAGNESIUM   ETHANOL   SALICYLATE LEVEL   ACETAMINOPHEN LEVEL       All other labs were within normal  range or not returned as of this dictation.    EMERGENCY DEPARTMENT COURSE and DIFFERENTIAL DIAGNOSIS/MDM:    Vitals:    Vitals:    10/06/15 1248 10/06/15 1409 10/06/15 1601   BP: 139/80 (!) 153/96 (!) 140/89   Pulse: 93 84 85   Resp: 18 18 18    Temp: 99.2 F (37.3 C)  97.8 F (36.6 C)   TempSrc: Oral  Tympanic   SpO2: 96% 94% 96%   Weight: 210 lb (95.3 kg)     Height: 5\' 5"  (1.651 m)         MDM    Reassessment  14:54 Scott RN is at patient's bedside for mental health exam.    CONSULTS:  IP CONSULT TO PSYCHIATRY    PROCEDURES:  Unless otherwise noted below, none     Procedures    FINAL IMPRESSION      1. Severe episode of recurrent major depressive disorder, without psychotic features (HCC)    2. Anxiety state          DISPOSITION/PLAN   DISPOSITION     PATIENT REFERRED TO:  No follow-up provider specified.    DISCHARGE MEDICATIONS:  New Prescriptions    No medications on file          (Please note that portions of this note were completed with a voice recognition program.  Efforts were made to edit the dictations but occasionally words are mis-transcribed.)    Gordy ClementMarie Freida Nebel, MD (electronically signed)  Attending Emergency Physician         Gordy ClementMarie Travious Vanover, MD  10/06/15 [phone]

## 2015-10-07 LAB — POCT GLUCOSE
POC Glucose: 203 mg/dl — ABNORMAL HIGH (ref 70–99)
POC Glucose: 67 mg/dl — ABNORMAL LOW (ref 70–99)
POC Glucose: 141 mg/dl — ABNORMAL HIGH (ref 70–99)
POC Glucose: 169 mg/dl — ABNORMAL HIGH (ref 70–99)

## 2015-10-07 MED ORDER — GLUCOSE 40 % PO GEL
40 | ORAL | Status: DC | PRN
Start: 2015-10-07 — End: 2015-10-11

## 2015-10-07 MED ORDER — INSULIN LISPRO 100 UNIT/ML SC SOLN
100 UNIT/ML | Freq: Three times a day (TID) | SUBCUTANEOUS | Status: DC
Start: 2015-10-07 — End: 2015-10-11
  Administered 2015-10-07 – 2015-10-10 (×7): 1 [IU] via SUBCUTANEOUS
  Administered 2015-10-10: 22:00:00 2 [IU] via SUBCUTANEOUS
  Administered 2015-10-11: 12:00:00 1 [IU] via SUBCUTANEOUS

## 2015-10-07 MED ORDER — CARBIDOPA-LEVODOPA 10-100 MG PO TABS
10-100 MG | Freq: Every evening | ORAL | Status: DC
Start: 2015-10-07 — End: 2015-10-06

## 2015-10-07 MED ORDER — DEXTROSE 50 % IV SOLN
50 | INTRAVENOUS | Status: DC | PRN
Start: 2015-10-07 — End: 2015-10-11

## 2015-10-07 MED ORDER — CARBIDOPA-LEVODOPA 10-100 MG PO TABS
10-100 MG | Freq: Every evening | ORAL | Status: DC | PRN
Start: 2015-10-07 — End: 2015-10-06

## 2015-10-07 MED ORDER — GLUCAGON HCL RDNA (DIAGNOSTIC) 1 MG IJ SOLR
1 MG | INTRAMUSCULAR | Status: DC | PRN
Start: 2015-10-07 — End: 2015-10-11

## 2015-10-07 MED ORDER — OLANZAPINE 10 MG PO TBDP
10 MG | Freq: Every evening | ORAL | Status: DC
Start: 2015-10-07 — End: 2015-10-11
  Administered 2015-10-07 – 2015-10-11 (×5): 10 mg via ORAL

## 2015-10-07 MED ORDER — DEXTROSE 5 % IV SOLN
5 | INTRAVENOUS | Status: DC | PRN
Start: 2015-10-07 — End: 2015-10-11

## 2015-10-07 MED FILL — OXCARBAZEPINE 150 MG PO TABS: 150 MG | ORAL | Qty: 1

## 2015-10-07 MED FILL — FAMOTIDINE 20 MG PO TABS: 20 MG | ORAL | Qty: 1

## 2015-10-07 MED FILL — CARBIDOPA-LEVODOPA 10-100 MG PO TABS: 10-100 MG | ORAL | Qty: 2

## 2015-10-07 MED FILL — HUMALOG 100 UNIT/ML SC SOLN: 100 UNIT/ML | SUBCUTANEOUS | Qty: 3

## 2015-10-07 MED FILL — QUETIAPINE FUMARATE 100 MG PO TABS: 100 MG | ORAL | Qty: 2

## 2015-10-07 MED FILL — TRAZODONE HCL 100 MG PO TABS: 100 MG | ORAL | Qty: 1

## 2015-10-07 MED FILL — BUPROPION HCL ER (SR) 150 MG PO TB12: 150 MG | ORAL | Qty: 2

## 2015-10-07 MED FILL — CARBIDOPA-LEVODOPA 10-100 MG PO TABS: 10-100 MG | ORAL | Qty: 1

## 2015-10-07 MED FILL — GLIPIZIDE 10 MG PO TABS: 10 MG | ORAL | Qty: 1

## 2015-10-07 MED FILL — OLANZAPINE 10 MG PO TBDP: 10 MG | ORAL | Qty: 1

## 2015-10-07 NOTE — Progress Notes (Signed)
Patient has rested quietly in bed this shift.  No distress noted or voiced.

## 2015-10-07 NOTE — Progress Notes (Signed)
[facility] Daily Shift Assessment  Nursing Progress Note    Room: 0616/616-01 Name: Joyce Cohen Age: 60 y.o.    Gender: female   Dx: <principal problem not specified>  Precautions: suicide risk and fall risk  Target Symptoms:    Accu-Chek: Yes 141  Sleep: Yes,Sleep Quality Fair SI denies AVH denies HI Negative for homicidal ideation    Hours Slept: 6 PRN Sleep Meds: Yes Other PRN Meds: No Med Compliant: Yes Appetite: good Percent Meals: 100% Social: Yes ADLs: Yes Speech: normal Depression: did not rate Anxiety: did not rate   Participation LevelActive Listener  Visitation: No  Participation QualityAppropriate    Notes: pt calm and cooperative during assessment. Pt dressed and groomed and sitting quietly in day area. Pt states she has been on her same medications for 30 years and states "they are just not working anymore". Pt states she is not suicidal she is just here to get a "complete rehaul" of her medications. Pt has no other complaints at this time.  Will continue to monitor.    Signature: Aquilla Solian, RN

## 2015-10-07 NOTE — H&P (Signed)
Joyce Cohen is an 60 y.o. Caucasian female admitted through ED with complaints of depression and anxiety.    Past Medical History:   Diagnosis Date   ??? Abdominal pain, right lower quadrant    ??? 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 Arthritis    ??? Bipolar disorder (HCC)    ??? Blood circulation, collateral    ??? Cancer (HCC)     skin cancer   ??? Diabetes mellitus (HCC)    ??? Hearing aid worn     bilateral   ??? Hernia    ??? Hyperlipidemia    ??? MVP (mitral valve prolapse)    ??? Neuromuscular disorder (HCC) Inguinal Nerve pain   ??? Pleurisy without mention of effusion or current tuberculosis    ??? Right groin pain 02/28/2014   ??? Stomach ulcer     as a child   ??? Torn rotator cuff     right shoulder   ??? Unspecified asthma    ??? Unspecified constipation    ??? Unspecified hemorrhoids without mention of complication        Allergies:   Allergies   Allergen Reactions   ??? Tobacco [Nicotiana Tabacum] Shortness Of Breath   ??? Decongestant [Pseudoephedrine Hcl]    ??? Motrin [Ibuprofen]    ??? Cabbage Nausea And Vomiting       Active Problems:    * No active hospital problems. *    Blood pressure (!) 139/90, pulse 73, temperature 97.4 ??F (36.3 ??C), temperature source Temporal, resp. rate 18, height 5\' 5"  (1.651 m), weight 206 lb 6 oz (93.6 kg), SpO2 96 %, not currently breastfeeding.    Review of Systems   Constitutional: Negative.    HENT: Negative.    Eyes: Negative.    Respiratory: Negative.    Cardiovascular: Negative.    Gastrointestinal: Negative.    Genitourinary: Negative.    Musculoskeletal: Negative.    Skin: Negative.    Neurological: Negative.    Psychiatric/Behavioral: Positive for depression. The patient is nervous/anxious.        Physical Exam   Constitutional: She is oriented to person, place, and time. She appears well-developed and well-nourished.   HENT:   Head: Normocephalic and atraumatic.   Eyes: Pupils are equal, round, and reactive to light.   Neck: Normal range of motion. Neck supple. No  thyromegaly present.   Cardiovascular: Normal rate, regular rhythm, normal heart sounds and intact distal pulses.    Pulmonary/Chest: Effort normal and breath sounds normal. No respiratory distress.   Abdominal: Soft. Bowel sounds are normal.   Musculoskeletal: Normal range of motion.   Neurological: She is alert and oriented to person, place, and time. No cranial nerve deficit.   Skin: Skin is warm and dry.   Psychiatric: She has a normal mood and affect. Her behavior is normal. Judgment and thought content normal.   Vitals reviewed.      Assessment:  Depression  Anxiety    Plan:  [facility]  Lab results and meds reviewed.  Alter plan of care as needed.    Sharlet SalinaGregory Tannisha Kennington, APRN  10/07/2015

## 2015-10-07 NOTE — Plan of Care (Signed)
Problem: Altered Mood, Depressive Behavior  Goal: LTG-Able to verbalize acceptance of life and situations over which he or she has no control  Outcome: Ongoing  Goal: LTG-Able to verbalize and/or display a decrease in depressive symptoms  Outcome: Ongoing  Goal: STG-Able to verbalize suicidal ideations  Outcome: Ongoing  Goal: STG-Able to verbalize support system  Outcome: Ongoing  Goal: LTG-Absence of self-harm  Outcome: Ongoing  Goal: STG-Knowledge of positive coping patterns  Outcome: Ongoing  Goal: Patient Specific Goal  Outcome: Ongoing  Goal: Participation in care planning  Outcome: Ongoing    Problem: Falls - Risk of:  Goal: Will remain free from falls  Will remain free from falls   Outcome: Ongoing  Goal: Absence of physical injury  Absence of physical injury   Outcome: Ongoing

## 2015-10-07 NOTE — Plan of Care (Signed)
Problem: Altered Mood, Depressive Behavior  Goal: LTG-Able to verbalize acceptance of life and situations over which he or she has no control  Outcome: Ongoing                                                                      Group Therapy Note     Date: 10/07/2015  Start Time: 1500           End Time:  1545           Number of Participants: 5     Type of Group: Recovery     Wellness Binder Information  Module Name:  Emotional Wellness  Session Number:  3.     Patient's Goal:  Happiness     Notes:  Pt discussed changes they need to make in their lives at this time, reasons they need to make them, steps they are planning to take, how the people in their lives can help them make the changes, and what might interfere with the plan to change. Pt also discussed the hardest thing about change and said that it is fear of failure and the unknown.     Status After Intervention:  Improved     Participation Level: Active Listener and Interactive     Participation Quality: Appropriate and Attentive        Speech:  normal        Thought Process/Content: Logical        Affective Functioning: Congruent        Mood: depressed        Level of consciousness:  Alert, Oriented x4 and Attentive        Response to Learning: Able to verbalize current knowledge/experience, Able to retain information and Progressing to goal        Endings: None Reported     Modes of Intervention: Support, Exploration and Clarifying        Discipline Responsible: Psychoeducational Specialist        Signature:  Barbie Banner

## 2015-10-07 NOTE — Plan of Care (Signed)
Problem: Altered Mood, Depressive Behavior  Goal: LTG-Able to verbalize acceptance of life and situations over which he or she has no control  Outcome: Ongoing  Goal: LTG-Able to verbalize and/or display a decrease in depressive symptoms  Outcome: Ongoing  Goal: STG-Able to verbalize suicidal ideations  Outcome: Ongoing  Goal: STG-Able to verbalize support system  Outcome: Ongoing  Goal: LTG-Absence of self-harm  Outcome: Met This Shift  Goal: STG-Knowledge of positive coping patterns  Outcome: Ongoing  Goal: Patient Specific Goal  Outcome: Ongoing  Goal: Participation in care planning  Outcome: Ongoing    Problem: Falls - Risk of:  Goal: Will remain free from falls  Will remain free from falls   Outcome: Met This Shift  Goal: Absence of physical injury  Absence of physical injury   Outcome: Met This Shift

## 2015-10-07 NOTE — Progress Notes (Signed)
Pt alert and oriented dressed in casual clothing.  Pt sitting in dayroom working on crafts.  Pt makes good eye contact and responds appropriately to questions.  Pt denies SI HI AVH.  Rated depression 6 and anxiety 8.  Pt ate 80% of meal and performed ADL's.  Pt attended group and interacted with patients and staff.  Pt reported "hard time going to sleep".  Pt had one visitor.    2030 Pt reported "I'm just really irritable all of a sudden."      Will continue to monitor.

## 2015-10-08 LAB — POCT GLUCOSE
POC Glucose: 123 mg/dl — ABNORMAL HIGH (ref 70–99)
POC Glucose: 151 mg/dl — ABNORMAL HIGH (ref 70–99)
POC Glucose: 159 mg/dl — ABNORMAL HIGH (ref 70–99)
POC Glucose: 256 mg/dl — ABNORMAL HIGH (ref 70–99)

## 2015-10-08 LAB — CULTURE, URINE: Urine Culture, Routine: <50000

## 2015-10-08 MED FILL — OXCARBAZEPINE 150 MG PO TABS: 150 MG | ORAL | Qty: 1

## 2015-10-08 MED FILL — FAMOTIDINE 20 MG PO TABS: 20 MG | ORAL | Qty: 1

## 2015-10-08 MED FILL — OLANZAPINE 10 MG PO TBDP: 10 MG | ORAL | Qty: 1

## 2015-10-08 MED FILL — GLIPIZIDE 10 MG PO TABS: 10 MG | ORAL | Qty: 1

## 2015-10-08 MED FILL — MONTELUKAST SODIUM 10 MG PO TABS: 10 MG | ORAL | Qty: 1

## 2015-10-08 NOTE — Progress Notes (Signed)
Pt ALOx4, pleasant and cooperative.  Upon shift change pt came to nurses desk.  Pt was tearful and anxious.  French Ana spoke with patient and calmed her down.  Pt rates her depression as okay but her anxiety high today.  Pt unsure why she is anxious. Pt eats well, med compliant and is social.  Pt denies SI, HI, and AVH.  Will continue to monitor.

## 2015-10-08 NOTE — Progress Notes (Signed)
Group Therapy Note    Date: 10/08/2015  Start Time: 2030  End Time:  2045  Number of Participants: 5    Type of Group: Wrap-Up      Status After Intervention:  Unchanged    Participation Level: Active Listener    Participation Quality: Attentive and Supportive      Speech:  normal      Thought Process/Content: Logical      Affective Functioning: Congruent      Mood: anxious      Level of consciousness:  Attentive      Response to Learning: Progressing to goal      Endings: None Reported    Modes of Intervention: Support and Socialization      Discipline Responsible: Registered Nurse      Signature:  Maudie Mercuryracy Burkhart, RN

## 2015-10-08 NOTE — Progress Notes (Signed)
Patient resting in bed with eyes closed, no distress noted.  Will continue to monitor for safety.

## 2015-10-08 NOTE — Plan of Care (Signed)
Problem: Altered Mood, Depressive Behavior  Goal: LTG-Able to verbalize acceptance of life and situations over which he or she has no control  Outcome: Ongoing  Goal: LTG-Able to verbalize and/or display a decrease in depressive symptoms  Outcome: Ongoing  Goal: STG-Able to verbalize suicidal ideations  Outcome: Ongoing  Goal: STG-Able to verbalize support system  Outcome: Ongoing  Goal: LTG-Absence of self-harm  Outcome: Ongoing  Goal: STG-Knowledge of positive coping patterns  Outcome: Ongoing  Goal: Patient Specific Goal  Outcome: Ongoing  Goal: Participation in care planning  Outcome: Ongoing    Problem: Falls - Risk of:  Goal: Will remain free from falls  Will remain free from falls   Outcome: Ongoing  Goal: Absence of physical injury  Absence of physical injury   Outcome: Ongoing

## 2015-10-08 NOTE — Progress Notes (Signed)
[facility] Daily Shift Assessment  Nursing Progress Note    Room: 0616/616-01 Name: Joyce Cohen Age: 60 y.o.    Gender: female   Dx: <principal problem not specified>  Precautions: suicide risk and fall risk  Target Symptoms:    Accu-Chek: Yes 141  Sleep: Yes,Sleep Quality Good SI denies AVH denies HI Negative for homicidal ideation    Hours Slept: 8 PRN Sleep Meds: Yes Other PRN Meds: No Med Compliant: Yes Appetite: good Percent Meals: 100% Social: Yes ADLs: Yes Speech: normal Depression: did not rate Anxiety: did not rate   Participation Teacher, adult education and Interactive  Visitation: Yes  Participation QualityAppropriate, Dispensing optician, Sharing and Supportive    Signature: Aquilla Solian, RN

## 2015-10-08 NOTE — Progress Notes (Signed)
10/08/2015 11:45 AM   Progress Note        LUDA VANDEPUTTE 1955/09/10  Psychotherapy Time Spent: 15 min      Psychotherapy Topics: health    Chief Complaint   Patient presents with   . Mental Health Problem     denies si/hi   . Anxiety       Subjective:  Patient seen today. In bed, supine, resting comfortably. Says she feels better than when she came. Seems satisfied with the medication adjustments ordered by Dr. Vilma Meckel. No questions or complaints today. No suicidal ideas intentions or plans.    Patient reports side effects as follows: none.  Reports no suicidal ideation.  Reports compliance with medications as good .     Review of Systems - Negative except sleepy some    Current Meds:    Prior to Admission medications    Medication Sig Start Date End Date Taking? Authorizing Provider   HUMALOG 100 UNIT/ML injection vial  09/01/15  Yes Historical Provider, MD   Lancets MISC  08/31/15  Yes Historical Provider, MD   traMADol (ULTRAM) 50 MG tablet Take 1 tablet by mouth every 8 hours as needed for Pain One month supply  Patient taking differently: Take 50 mg by mouth every 8 hours as needed for Pain Indications: only 45 tables per prescription One month supply 06/29/15  Yes Beatrix Fetters, MD   NONFORMULARY Arthritis hand ointment   Yes Historical Provider, MD   albuterol sulfate HFA (VENTOLIN HFA) 108 (90 BASE) MCG/ACT inhaler Inhale 2 puffs into the lungs every 4 hours as needed for Wheezing 04/09/15  Yes Lyndee Hensen, APRN   Ospemifene 60 MG TABS Take 1 tablet by mouth daily 03/27/15  Yes Gaye Alken, MD   QUEtiapine (SEROQUEL) 400 MG tablet Take 200 mg by mouth nightly Indications: 200 mg at bedtime    Yes Historical Provider, MD   carbidopa-levodopa (SINEMET) 10-100 MG per tablet Take 1 tablet by mouth nightly Indications: 1 to 2 tablets at bedtime    Yes Historical Provider, MD   montelukast (SINGULAIR) 10 MG tablet Take 10 mg by mouth nightly   Yes Historical Provider, MD   vitamin D (ERGOCALCIFEROL) 50000 UNITS  CAPS capsule Take 50,000 Units by mouth once a week  07/13/14  Yes Historical Provider, MD   ranitidine (ZANTAC) 150 MG tablet Take 150 mg by mouth 2 times daily  07/13/14  Yes Historical Provider, MD   traZODone (DESYREL) 100 MG tablet Take 1 tablet by mouth nightly as needed for Sleep  Patient taking differently: Take 300 mg by mouth nightly Indications: 1 to 3 tablets for sleep at night  05/16/14  Yes Maylene Roes, MD   OXcarbazepine (TRILEPTAL) 150 MG tablet Take 1 tablet by mouth 2 times daily 05/16/14  Yes Maylene Roes, MD   lubiprostone (AMITIZA) 24 MCG capsule Take 1 capsule by mouth 2 times daily (with meals) 03/17/14  Yes Percival Spanish, APRN   glipiZIDE (GLUCOTROL) 10 MG tablet Take 1 tablet by mouth every morning (before breakfast) 02/07/14  Yes Jeanene Erb, DO   buPROPion SR [facility] SR) 150 MG SR tablet Take 1 tablet by mouth daily  Patient taking differently: Take 300 mg by mouth daily  05/16/14   Maylene Roes, MD       @    MSE:  Patient is  A & O x3.  Appearance:  well-appearing  Cognition:  Recent memory intact , remote memory  intact , good fund of knowledge, average intelligence level.   Speech:  normal  Language: Naming: intact; Word Finding: intact  Conversation no evidence of delusions  Behavior:  Cooperative  Mood: improved  Affect: congruent with mood and full range  Thought Content: negative delusions, hallucinations, obsessions, homicidal and suicidal  Thought Process: linear and goal directed  Judgement Insight:  improved and appropriate  Gait and Station:normal gait and station   Musculoskeletal:    Assesment:   1. Severe episode of recurrent major depressive disorder, without psychotic features (HCC)    2. Anxiety state        Plan:  1. The patient continues to need, on a daily basis, active treatment furnished directly by or requiring the supervision of inpatient psychiatric facility personnel.  2. Same medications for now  3. Supportive therapy offered    Luna Fuse  M.D.  @L[facility]PLAN @       @[employer] @

## 2015-10-09 LAB — POCT GLUCOSE
POC Glucose: 268 mg/dl — ABNORMAL HIGH (ref 70–99)
POC Glucose: 160 mg/dl — ABNORMAL HIGH (ref 70–99)
POC Glucose: 103 mg/dl — ABNORMAL HIGH (ref 70–99)
POC Glucose: 181 mg/dl — ABNORMAL HIGH (ref 70–99)

## 2015-10-09 MED ORDER — MELATONIN 3 MG PO TABS
3 MG | Freq: Every evening | ORAL | Status: DC | PRN
Start: 2015-10-09 — End: 2015-10-10
  Administered 2015-10-10: 02:00:00 3 mg via ORAL

## 2015-10-09 MED FILL — GLIPIZIDE 10 MG PO TABS: 10 MG | ORAL | Qty: 1

## 2015-10-09 MED FILL — MONTELUKAST SODIUM 10 MG PO TABS: 10 MG | ORAL | Qty: 1

## 2015-10-09 MED FILL — OXCARBAZEPINE 150 MG PO TABS: 150 MG | ORAL | Qty: 1

## 2015-10-09 MED FILL — FAMOTIDINE 20 MG PO TABS: 20 MG | ORAL | Qty: 1

## 2015-10-09 MED FILL — MILK OF MAGNESIA 7.75 % PO SUSP: 7.75 % | ORAL | Qty: 30

## 2015-10-09 MED FILL — OLANZAPINE 10 MG PO TBDP: 10 MG | ORAL | Qty: 1

## 2015-10-09 NOTE — Plan of Care (Signed)
Problem: Nutrition  Goal: Optimal nutrition therapy  Outcome: Ongoing  Nutrition Problem: Altered nutrition-related lab values  Intervention: Food and/or Nutrient Delivery: Modify current diet  Nutritional Goals: Glucose levels at or below 150

## 2015-10-09 NOTE — Plan of Care (Signed)
Problem: Altered Mood, Depressive Behavior  Goal: LTG-Able to verbalize acceptance of life and situations over which he or she has no control  Outcome: Ongoing  Goal: LTG-Able to verbalize and/or display a decrease in depressive symptoms  Outcome: Ongoing  Goal: STG-Able to verbalize suicidal ideations  Outcome: Ongoing  Goal: STG-Able to verbalize support system  Outcome: Met This Shift  Goal: LTG-Absence of self-harm  Outcome: Ongoing  Goal: STG-Knowledge of positive coping patterns  Outcome: Ongoing  Goal: Patient Specific Goal  Outcome: Ongoing  Goal: Participation in care planning  Outcome: Ongoing    Problem: Falls - Risk of:  Goal: Will remain free from falls  Will remain free from falls   Outcome: Met This Shift  Goal: Absence of physical injury  Absence of physical injury   Outcome: Met This Shift    Problem: Nutrition  Goal: Optimal nutrition therapy  Outcome: Ongoing

## 2015-10-09 NOTE — Progress Notes (Signed)
Patient resting in bed with eyes closed.  Will continue to monitor for safety.

## 2015-10-09 NOTE — Plan of Care (Signed)
Problem: Altered Mood, Depressive Behavior  Goal: LTG-Able to verbalize acceptance of life and situations over which he or she has no control  Outcome: Ongoing                                                                      Group Therapy Note     Date: 10/09/2015  Start Time: 1000  End Time:  1050  Number of Participants: 4     Type of Group: Psychoeducation     Wellness Binder Information  Module Name:  Stress  Session Number:  3     Patient's Goal:  Preventing stress     Notes:  Pt attended group as scheduled. Pt participated by identifying different ways to prevent stress. Pt participated in group discussion exploring stress management techniques.       Status After Intervention:  Improved     Participation Level: Active Listener and Interactive     Participation Quality: Appropriate, Attentive and Sharing        Speech:  normal        Thought Process/Content: Logical        Affective Functioning: Congruent        Mood: anxious        Level of consciousness:  Attentive        Response to Learning: Able to verbalize current knowledge/experience, Able to verbalize/acknowledge new learning and Able to retain information        Endings: None Reported     Modes of Intervention: Education, Support, Socialization and Exploration        Discipline Responsible: Social Worker/Counselor        Signature:  Caleen Essex

## 2015-10-09 NOTE — Progress Notes (Signed)
10/09/2015 5:08 PM   Progress Note        Joyce Cohen [birth year]  Psychotherapy Time Spent: 20 min      Psychotherapy Topics: health    Chief Complaint   Patient presents with   ??? Mental Health Problem     denies si/hi   ??? Anxiety       Subjective:  The patient is a 60 year old white female admitted via the ER due to not sleeping, manic like behaviors. Today the patient is complaining that she is still not sleeping. She denies SI or HI but does admit that she was not able to function at home with her mood disorder and no sleep.     Patient reports side effects as follows: none.  Reports no suicidal ideation.  Reports compliance with medications as fair .     Review of Systems - Negative except for poor sleep  History obtained from chart review and the patient  Psychological ROS: positive for - anxiety and sleep disturbances  14 point review of system is negative  Current Meds:    Prior to Admission medications    Medication Sig Start Date End Date Taking? Authorizing Provider   HUMALOG 100 UNIT/ML injection vial  09/01/15  Yes Historical Provider, MD   Lancets MISC  08/31/15  Yes Historical Provider, MD   traMADol (ULTRAM) 50 MG tablet Take 1 tablet by mouth every 8 hours as needed for Pain One month supply  Patient taking differently: Take 50 mg by mouth every 8 hours as needed for Pain Indications: only 45 tables per prescription One month supply 06/29/15  Yes Beatrix Fetters, MD   NONFORMULARY Arthritis hand ointment   Yes Historical Provider, MD   albuterol sulfate HFA (VENTOLIN HFA) 108 (90 BASE) MCG/ACT inhaler Inhale 2 puffs into the lungs every 4 hours as needed for Wheezing 04/09/15  Yes Lyndee Hensen, APRN   Ospemifene 60 MG TABS Take 1 tablet by mouth daily 03/27/15  Yes Gaye Alken, MD   QUEtiapine (SEROQUEL) 400 MG tablet Take 200 mg by mouth nightly Indications: 200 mg at bedtime    Yes Historical Provider, MD   carbidopa-levodopa (SINEMET) 10-100 MG per tablet Take 1 tablet by mouth nightly  Indications: 1 to 2 tablets at bedtime    Yes Historical Provider, MD   montelukast (SINGULAIR) 10 MG tablet Take 10 mg by mouth nightly   Yes Historical Provider, MD   vitamin D (ERGOCALCIFEROL) 50000 UNITS CAPS capsule Take 50,000 Units by mouth once a week  07/13/14  Yes Historical Provider, MD   ranitidine (ZANTAC) 150 MG tablet Take 150 mg by mouth 2 times daily  07/13/14  Yes Historical Provider, MD   traZODone (DESYREL) 100 MG tablet Take 1 tablet by mouth nightly as needed for Sleep  Patient taking differently: Take 300 mg by mouth nightly Indications: 1 to 3 tablets for sleep at night  05/16/14  Yes Maylene Roes, MD   OXcarbazepine (TRILEPTAL) 150 MG tablet Take 1 tablet by mouth 2 times daily 05/16/14  Yes Maylene Roes, MD   lubiprostone (AMITIZA) 24 MCG capsule Take 1 capsule by mouth 2 times daily (with meals) 03/17/14  Yes Percival Spanish, APRN   glipiZIDE (GLUCOTROL) 10 MG tablet Take 1 tablet by mouth every morning (before breakfast) 02/07/14  Yes Marcelina Morel Eligh Rybacki, DO   buPROPion SR (WELLBUTRIN SR) 150 MG SR tablet Take 1 tablet by mouth daily  Patient taking differently: Take  300 mg by mouth daily  05/16/14   Maylene Roes, MD       @    MSE:  Patient is  A & O x3.  Appearance:  fair  Cognition:  Recent memory intact , remote memory intact , fair fund of knowledge,  average intelligence level.   Speech:  normal  Language: Naming: intact; Word Finding: intact  Conversation no evidence of delusions and mood congruent  Behavior:  Cooperative and Manipulative  Mood: irritable and anxious  Affect: congruent with mood  Thought Content: negative   Thought Process: goal directed  Judgement Insight:  fair and fair  Gait and Station:normal gait and station   Musculoskeletal:no issues    Assesment:   1. Severe episode of recurrent major depressive disorder, without psychotic features (HCC)    2. Anxiety state        Plan:  1. The risks, benefits, side effects, indications, contraindications, and  adverse effects of the medications have been discussed.  2. The pt has verbalized understanding and has capacity to give informed consent.  3. The Zadie Rhine report has been reviewed according to [facility] regulations.  4. Supportive therapy offered.   Orders Placed This Encounter   Medications   ??? acetaminophen (TYLENOL) tablet 650 mg   ??? magnesium hydroxide (MILK OF MAGNESIA) 400 MG/5ML suspension 30 mL   ??? DISCONTD: traZODone (DESYREL) tablet 100 mg   ??? albuterol sulfate HFA 108 (90 Base) MCG/ACT inhaler 2 puff   ??? DISCONTD: buPROPion [facility] SR) extended release tablet 300 mg   ??? DISCONTD: carbidopa-levodopa (SINEMET) 10-100 MG per tablet 1 tablet   ??? glipiZIDE (GLUCOTROL) tablet 10 mg   ??? montelukast (SINGULAIR) tablet 10 mg   ??? DISCONTD: Ospemifene TABS 1 tablet   ??? OXcarbazepine (TRILEPTAL) tablet 150 mg   ??? DISCONTD: QUEtiapine (SEROQUEL) tablet 200 mg   ??? famotidine (PEPCID) tablet 20 mg   ??? DISCONTD: traMADol (ULTRAM) tablet 50 mg   ??? vitamin D (ERGOCALCIFEROL) capsule 50,000 Units   ??? DISCONTD: carbidopa-levodopa (SINEMET) 10-100 MG per tablet 1 tablet   ??? DISCONTD: carbidopa-levodopa (SINEMET) 10-100 MG per tablet 2 tablet   ??? insulin lispro (HUMALOG) injection vial 0-6 Units   ??? glucose (GLUTOSE) 40 % oral gel 15 g   ??? dextrose 50 % solution 12.5 g   ??? glucagon (rDNA) injection 1 mg   ??? dextrose 5 % solution   ??? DISCONTD: carbidopa-levodopa (SINEMET) 10-100 MG per tablet 2 tablet   ??? DISCONTD: carbidopa-levodopa (SINEMET) 10-100 MG per tablet 1 tablet   ??? OLANZapine zydis (ZYPREXA) disintegrating tablet 10 mg        Orders Placed This Encounter   Procedures   ??? Urine Culture     Standing Status:   Standing     Number of Occurrences:   1   ??? CT Head WO Contrast     Standing Status:   Standing     Number of Occurrences:   1   ??? Comprehensive Metabolic Panel     Standing Status:   Standing     Number of Occurrences:   1   ??? CBC Auto Differential     Standing Status:   Standing     Number of Occurrences:   1   ???  Magnesium     Standing Status:   Standing     Number of Occurrences:   1   ??? Urinalysis     Standing Status:   Standing  Number of Occurrences:   1   ??? Urine Drug Screen     Standing Status:   Standing     Number of Occurrences:   1   ??? Ethanol     Standing Status:   Standing     Number of Occurrences:   1   ??? Salicylate     Standing Status:   Standing     Number of Occurrences:   1   ??? Acetaminophen Level     Standing Status:   Standing     Number of Occurrences:   1   ??? Microscopic Urinalysis     Standing Status:   Standing     Number of Occurrences:   1   ??? DIET CARB CONTROL; Carb Control: 5 carbs/meal (approximate 2000 kcals/day)     ALLERGY TO CABBAGE     Standing Status:   Standing     Number of Occurrences:   1     Order Specific Question:   Carb Control     Answer:   5 carbs/meal (approximate 2000 kcals/day)   ??? Vital signs per unit routine     Standing Status:   Standing     Number of Occurrences:   1   ??? Up as tolerated     Standing Status:   Standing     Number of Occurrences:   99999   ??? Observation     Q15 minute checks and suicide precautions     Standing Status:   Standing     Number of Occurrences:   1   ??? Search Patient     If not completed in ED.  Remove patient belongings from the room.     Standing Status:   Standing     Number of Occurrences:   1   ??? Tobacco cessation education     Standing Status:   Standing     Number of Occurrences:   1   ??? Weigh patient     Wednesdays weight weekly     Standing Status:   Standing     Number of Occurrences:   1   ??? Nursing communication     [facility] 10/06/2015 at 1700 LRiveraRN     Standing Status:   Standing     Number of Occurrences:   1   ??? HYPOGLYCEMIA TREATMENT: blood glucose less than 50 mg/dL and patient  ALERT and TOLERATING PO     Give 8 ounces juice or regular soda or 2 tubes glucose gel. Repeat blood glucose in 15 minutes. If blood glucose is less than 70 mg/dL, repeat treatment and recheck blood glucose in 15 minutes x2 and  notify provider.     Standing Status:   Standing     Number of Occurrences:   99999   ??? HYPOGLYCEMIA TREATMENT: blood glucose less than 70 mg/dL and patient ALERT and TOLERATING PO     Give 4 ounces juice or regular soda or 1 tube glucose gel. Repeat blood glucose in 15 minutes. If blood glucose is less than 70 mg/dL, repeat treatment and recheck blood glucose in 15 minutes x2. If blood glucose remains less than 70 mg/dL, notify provider.     Standing Status:   Standing     Number of Occurrences:   99999   ??? HYPOGLYCEMIA TREATMENT: blood glucose less than 70 mg/dL and patient NOT ALERT or NPO     Give dextrose 50% intravenous. If patient does not  respond within 5 minutes, repeat dose x1.  Start D5W at 100 mL/hour until ordering provider can be reached. Repeat blood glucose in 15 minutes. If blood glucose is less than 70 mg/dL, repeat treatment and recheck blood glucose in 15 minutes x2.  Notify provider. If no intravenous access, administer glucagon 1 mg. After administration, attempt intravenous access and start D5W at 100 mL/hr. Repeat blood glucose in 15 minutes x2 and notify provider.     Standing Status:   Standing     Number of Occurrences:   99999   ??? Full Code     Standing Status:   Standing     Number of Occurrences:   1   ??? Inpatient consult to Psychiatry     Standing Status:   Standing     Number of Occurrences:   1     Order Specific Question:   Reason for Consult?     Answer:   anxiety   ??? Consult to Internal Medicine     Standing Status:   Standing     Number of Occurrences:   1     Order Specific Question:   Reason for Consult?     Answer:   H&P   ??? POCT Glucose     Standing Status:   Standing     Number of Occurrences:   1   ??? POCT Glucose     Standing Status:   Standing     Number of Occurrences:   25   ??? POCT glucose     If blood sugar is below 110 at bedtime, then check blood glucose at 0200.     Standing Status:   Standing     Number of Occurrences:   25   ??? POCT Glucose     Repeat blood glucose  15 minutes following intervention.  If blood glucose is less than 70 mg/dL, repeat treatment and recheck blood glucose in 15 minutes x2.  If blood glucose remains less than 70 mg/dL, call ordering provider for further instruction.   If patient experienced a hypoglycemic event in last 24 hours, obtain blood glucose at 0200.     Standing Status:   Standing     Number of Occurrences:   [phone]   ??? POCT Glucose     Standing Status:   Standing     Number of Occurrences:   1   ??? POCT Glucose     Standing Status:   Standing     Number of Occurrences:   1   ??? POCT Glucose     Standing Status:   Standing     Number of Occurrences:   1   ??? POCT Glucose     Standing Status:   Standing     Number of Occurrences:   1   ??? POCT Glucose     Standing Status:   Standing     Number of Occurrences:   1   ??? POCT Glucose     Standing Status:   Standing     Number of Occurrences:   1   ??? POCT Glucose     Standing Status:   Standing     Number of Occurrences:   1   ??? POCT Glucose     Standing Status:   Standing     Number of Occurrences:   1   ??? POCT Glucose     Standing Status:   Standing     Number of Occurrences:  1   ??? POCT Glucose     Standing Status:   Standing     Number of Occurrences:   1   ??? POCT Glucose     Standing Status:   Standing     Number of Occurrences:   1   ??? POCT Glucose     Standing Status:   Standing     Number of Occurrences:   1   ??? PATIENT STATUS (FROM ED OR OR/PROCEDURAL) Inpatient     Standing Status:   Standing     Number of Occurrences:   1     Order Specific Question:   Patient Class     Answer:   Inpatient [101]     Order Specific Question:   REQUIRED: Diagnosis     Answer:   Bipolar I disorder, most recent episode mixed, severe with psychotic features [facility](HCC) [[id number]]) [[id number]]     Order Specific Question:   Estimated Length of Stay     Answer:   Estimated stay of more than 2 midnights     Order Specific Question:   Future Attending Provider     AnswerGeorgia Lopes:   MCGAFFEE, DANA [[id number]]   ??? Suicide precautions     Standing  Status:   Standing     Number of Occurrences:   1   ??? Fall precautions     Standing Status:   Standing     Number of Occurrences:   1   ??? Routine visitation status     Standing Status:   Standing     Number of Occurrences:   1       Will add melatonin to the patients bedtime medications     Continue hospitalization for safety and stabilization  Monitor every 15 minutes for safety  ?? Encourage participation in the group therapies  The patient continues to need, on a daily basis, active treatment furnished directly by or requiring the supervision of inpatient psychiatric facility personnel.              Robby SermonLaurie K. Jarl Sellitto, Ed.D.D.O.DFAPA

## 2015-10-09 NOTE — Plan of Care (Signed)
Problem: Altered Mood, Depressive Behavior  Goal: STG-Knowledge of positive coping patterns  Outcome: Ongoing                                                                      Group Therapy Note     Date: 10/09/2015  Start Time: 1100  End Time:  1200  Number of Participants: 4     Type of Group: Psychoeducation     Wellness Binder Information  Module Name:  Stress  Session Number:  5     Patient's Goal:  To improve knowledge of effective stress management techniques     Notes:  Pt participated in group discussion, identified effective stress management techniques.     Status After Intervention:  Unchanged     Participation Level: Interactive     Participation Quality: Attentive        Speech:  pressured        Thought Process/Content: Logical        Affective Functioning: Flat        Mood: anxious and depressed        Level of consciousness:  Alert and Oriented x4        Response to Learning: Able to verbalize current knowledge/experience, Able to verbalize/acknowledge new learning and Progressing to goal        Endings: None Reported     Modes of Intervention: Education        Discipline Responsible: Psychoeducational Specialist        Signature:  Clydell Hakim

## 2015-10-09 NOTE — Plan of Care (Signed)
Problem: Altered Mood, Depressive Behavior  Goal: LTG-Able to verbalize acceptance of life and situations over which he or she has no control  Outcome: Ongoing  Goal: LTG-Able to verbalize and/or display a decrease in depressive symptoms  Outcome: Ongoing  Goal: STG-Able to verbalize suicidal ideations  Outcome: Ongoing  Goal: STG-Able to verbalize support system  Outcome: Ongoing  Goal: LTG-Absence of self-harm  Outcome: Ongoing  Goal: STG-Knowledge of positive coping patterns  Outcome: Ongoing  Goal: Patient Specific Goal  Outcome: Ongoing  Goal: Participation in care planning  Outcome: Ongoing    Problem: Falls - Risk of:  Goal: Will remain free from falls  Will remain free from falls   Outcome: Ongoing  Goal: Absence of physical injury  Absence of physical injury   Outcome: Ongoing

## 2015-10-09 NOTE — Progress Notes (Signed)
Pt dressed in casual clothing sitting in dayroom.  Pt anxious, makes fair eye contact and irritable.  Pt denies SI, HI, AVH.  Rated depression 0 and Anxiety 6.  Pt performing ADL's and attending groups.  Pt social with staff and patients.  Pt came to nurses station and stated "I'm am starting to feel like I want to throw something at the TV."  Nurse talked to pt and redirected attention.  Pt reported "trouble sleeping" and ate 100% of meal.  Pt had visit from husband.  Will continue to monitor.

## 2015-10-09 NOTE — Progress Notes (Signed)
Group Therapy Note    Date: 10/09/2015  Start Time: 2030  End Time:  2045  Number of Participants: 5    Type of Group: Wrap-Up    Status After Intervention:  Unchanged    Participation Level: Active Listener    Participation Quality: Appropriate and Attentive      Speech:  normal      Thought Process/Content: Logical      Affective Functioning: Congruent      Mood: anxious      Level of consciousness:  Alert and Attentive      Response to Learning: Progressing to goal      Endings: None Reported    Modes of Intervention: Support and Socialization      Discipline Responsible: Registered Nurse      Signature:  Maudie Mercuryracy Burkhart, RN

## 2015-10-09 NOTE — Progress Notes (Signed)
RT leisure assessment is complete.  Pt will be encouraged to attend scheduled group activities to address leisure and social deficits.

## 2015-10-09 NOTE — Progress Notes (Signed)
Collateral obtained from: Lina SarJohn Coomes pt's husband (release signed) [phone]    Immediate Stressors & Time Episode Began: Pt's husband reported pt feeling really depressed and having "high anxiety that she could not control". Pt's husband reported pt "hits self in head to feel better when she becomes depressed".      Diagnosis/Hx of compliance with meds: Pt's husband reported pt was diagnosed with Bipolar disorder over forty years ago. Pt's husband reports pt takes medications as prescribed because if not "she will not be able to sleep".     Tx Hx/Past hospitalizations: Pt's husband stated pt was "ad[facility] to [facility]". Pt's husband also reported pt goes to [facility] in [place].     Family hx of psychiatric issues: Pt's husband stated pt's dad was diagnosed with Bipolar disorder.     Substance Abuse: Pt's husband stated none that he is aware of.     Pending Legal: Pt's husband stated none that he is aware of.     Safety Issues (Weapons? Hx of attempts): Pt's husband reported no weapons in home. Pt's husband stated there has been "three or four past suicide attempts where she would cut her wrists".     Support system/Medication Managed by: The importance of medication management and locking extra medication in a secured location was explained and reccommended to collateral. Pt's husband voiced understanding and agreement. Pt's husband stated helping her with anything she may need. Pt's husband stated pt "fills up pill organizer every Sunday night".     Who does the pt live with: Pt's husband stated pt lives with him.     Electronically signed by Caleen EssexPortia Smayan Hackbart SWK I on 10/09/2015 at 12:10 PM

## 2015-10-09 NOTE — Progress Notes (Signed)
Progress Note  Lyanne CoBreandan A Fei  10/09/2015 10:47 PM  Subjective:   Admit Date:   10/06/2015      CC/ADMIT DX:       Interval History:   Reviewed overnight events and nursing notes.  No new physical complaints.        I have reviewed all labs/diagnostics from the last 24hrs.       ROS:   I have done a 10 point ROS and all are negative, except what is mentioned in the HPI.    DIET CARB CONTROL; Carb Control: 5 carbs/meal (approximate 2000 kcals/day)    Medications:     ??? dextrose       ??? glipiZIDE  10 mg Oral QAM AC   ??? montelukast  10 mg Oral Nightly   ??? OXcarbazepine  150 mg Oral BID   ??? famotidine  20 mg Oral BID   ??? [START ON 10/13/2015] vitamin D  50,000 Units Oral Weekly   ??? insulin lispro  0-6 Units Subcutaneous TID WC   ??? OLANZapine zydis  10 mg Oral Nightly           Objective:   Vitals: BP (!) 136/95   Pulse 97   Temp 98 ??F (36.7 ??C) (Temporal)    Resp 14   Ht 5\' 5"  (1.651 m)   Wt 206 lb 6 oz (93.6 kg)   SpO2 94%   BMI 34.34 kg/m2   Intake/Output Summary (Last 24 hours) at 10/09/15 2247  Last data filed at 10/09/15 2049   Gross per 24 hour   Intake              360 ml   Output                0 ml   Net              360 ml     General appearance: alert and cooperative with exam  Lungs: clear to auscultation bilaterally  Heart: regular rate and rhythm, S1, S2 normal, no murmur, click, rub or gallop  Abdomen: soft, non-tender; bowel sounds normal; no masses,  no organomegaly  Extremities: extremities normal, atraumatic, no cyanosis or edema  Neurologic:  No obvious focal neurologic deficits.    Assessment and Plan:   Active Problems:    Severe episode of recurrent major depressive disorder, without psychotic features (HCC)    DM2    Plan:  1.  Continue present medication(s)   2.  Monitor BP and glucose  3.  Follow with Psych      Discharge planning:   her home     Reviewed treatment plans with the patient and/or family.             Electronically signed by Danny LawlessAribbe A Dymond Gutt, MD on 10/09/2015 at 10:47 PM

## 2015-10-09 NOTE — Progress Notes (Signed)
[facility] Daily Shift Assessment  Nursing Progress Note    Room: 0616/616-01 Name: JASMON MATTICE Age: 60 y.o.    Gender: female   Dx: <principal problem not specified>  Precautions: suicide risk and fall risk  Target Symptoms:    Accu-Chek: Yes 160  Sleep: Yes,Sleep Quality Poor SI denies AVH denies HI Negative for homicidal ideation    Hours Slept: 8 PRN Sleep Meds: Yes Other PRN Meds: No Med Compliant: Yes Appetite: good Percent Meals: 100% Social: Yes ADLs: Yes Speech: normal Depression: 4 Anxiety: 8   Participation Teacher, adult education and Interactive  Visitation: Yes  Participation QualityAppropriate, Attentive, Sharing and Supportive    Notes: pt dressed and groomed sitting in day area reading a book. Pt states her depression is better today but her anxiety is "on up there" pt denies any suicidal thoughts at this time. Will continue to monitor.     Signature: Aquilla Solian, RN

## 2015-10-09 NOTE — Behavioral Health Treatment Team (Signed)
Group Therapy Note    Date: 10/09/15  Start Time: 0830  End Time:  0900    Number of Participants: 5    Type of Group: community/unit rules    Patient's Goal:  "to get meds straight"    Notes:  Pt calm and cooperative during group    Status After Intervention:  Improved    Participation Level: Active Listener and Interactive    Participation Quality: Appropriate, Attentive, Sharing and Supportive    Speech:  normal    Thought Process/Content: Logical    Affective Functioning: Congruent    Mood: depressed    Level of consciousness:  Alert and Oriented x4    Response to Learning: Able to verbalize current knowledge/experience, Able to verbalize/acknowledge new learning, Able to retain information and Capable of insight    Modes of Intervention: Education and Support    Discipline Responsible: Registered Nurse    Signature:  Aquilla Solian, RN

## 2015-10-09 NOTE — Progress Notes (Signed)
Nutrition Assessment    Type and Reason for Visit: Initial, Positive Nutrition Screen    Nutrition Recommendations: Modify diet to CHO 5    Malnutrition Assessment:   Malnutrition Status: No malnutrition   Context: Chronic illness   Findings of the 6 clinical characteristics of malnutrition (Minimum of 2 out of 6 clinical characteristics is required to make the diagnosis of moderate or severe Protein Calorie Malnutrition based on AND/ASPEN Guidelines):  1. Energy Intake-Greater than 75%, not able to assess    2. Weight Loss-No significant weight loss,    3. Fat Loss-No significant subcutaneous fat loss,    4. Muscle Loss-No significant muscle mass loss,    5. Fluid Accumulation-No significant fluid accumulation,    6. Grip Strength-Not measured    Nutrition Diagnosis:    Problem: Altered nutrition-related lab values   Etiology: related to Endocrine dysfunction    . Signs and symptoms:  as evidenced by Lab values    Nutrition Assessment:   Subjective Assessment: +NS for reported wt loss & decreased appetite. Wt hx reviewed. Pt has had an 8# wt loss over the last month, which is not significant. Wt loss will be beneficial to pt's overall health status r/t obesity & evidenced by BMI. Pt currently has a good appetite & consumes 76-100% of meal trays. No nutritional interventions are warranted at this time.   Wound Type: None   Current Nutrition Therapies:   Oral Diet Orders: General    Oral Diet intake: 76-100%   Oral Nutrition Supplement (ONS) Orders: None   Anthropometric Measures:   Ht: 5\' 5"  (165.1 cm)    Admission Body Wt: 206 lb 6 oz (93.6 kg)   Ideal Body Wt: 136 lb (61.7 kg)    BMI Classification: BMI 30.0 - 34.9 Obese Class I    Estimated Intake vs Estimated Needs: Intake Meets Needs    Nutrition Risk Level: Moderate    Nutrition Interventions:   Modify current diet  Continued Inpatient Monitoring    Nutrition Evaluation:    Evaluation: Goals set    Goals: Glucose levels at or below 150      Monitoring: Meal Intake, Weight, Pertinent Labs    See Adult Nutrition Doc Flowsheet for more detail.     Electronically signed by Eugene Gavia, RD, LD on 10/09/15 at 4:06 PM    Contact Number: [phone]
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
## 2015-10-10 LAB — POCT GLUCOSE
POC Glucose: 282 mg/dl — ABNORMAL HIGH (ref 70–99)
POC Glucose: 169 mg/dl — ABNORMAL HIGH (ref 70–99)
POC Glucose: 87 mg/dl (ref 70–99)
POC Glucose: 209 mg/dl — ABNORMAL HIGH (ref 70–99)

## 2015-10-10 LAB — VITAMIN D 25 HYDROXY: Vit D, 25-Hydroxy: 33 ng/mL (ref >=30)

## 2015-10-10 LAB — TSH: TSH: 1.64 u[IU]/mL (ref 0.270–4.200)

## 2015-10-10 LAB — VITAMIN B12: Vitamin B-12: 581 pg/mL (ref 211–946)

## 2015-10-10 MED ORDER — MELATONIN 3 MG PO TABS
3 MG | Freq: Every evening | ORAL | Status: DC | PRN
Start: 2015-10-10 — End: 2015-10-11

## 2015-10-10 MED FILL — OLANZAPINE 10 MG PO TBDP: 10 MG | ORAL | Qty: 1

## 2015-10-10 MED FILL — MELATONIN 3 MG PO TABS: 3 MG | ORAL | Qty: 1

## 2015-10-10 MED FILL — GLIPIZIDE 10 MG PO TABS: 10 MG | ORAL | Qty: 1

## 2015-10-10 MED FILL — OXCARBAZEPINE 150 MG PO TABS: 150 MG | ORAL | Qty: 1

## 2015-10-10 MED FILL — MONTELUKAST SODIUM 10 MG PO TABS: 10 MG | ORAL | Qty: 1

## 2015-10-10 MED FILL — FAMOTIDINE 20 MG PO TABS: 20 MG | ORAL | Qty: 1

## 2015-10-10 NOTE — Plan of Care (Signed)
Problem: Altered Mood, Depressive Behavior  Goal: LTG-Able to verbalize acceptance of life and situations over which he or she has no control  Outcome: Ongoing                                                                      Group Therapy Note     Date: 10/10/2015  Start Time: 1430  End Time:  1535  Number of Participants: 4     Type of Group: Physicist, medical Information  Module Name:  Emotional wellness  Session Number:  2     Patient's Goal:  Anger management     Notes:  Pt attended group as scheduled. Pt participated by identifying issue's that can cause anger. Pt participated in group discussion exploring anger management techniques.      Status After Intervention:  Improved     Participation Level: Active Listener and Interactive     Participation Quality: Appropriate, Attentive and Sharing        Speech:  normal        Thought Process/Content: Logical        Affective Functioning: Congruent        Mood: anxious        Level of consciousness:  Attentive        Response to Learning: Able to verbalize current knowledge/experience and Able to verbalize/acknowledge new learning        Endings: None Reported     Modes of Intervention: Education, Support, Socialization and Exploration        Discipline Responsible: Social Worker/Counselor        Signature:  Caleen Essex

## 2015-10-10 NOTE — Plan of Care (Signed)
Problem: Altered Mood, Depressive Behavior  Goal: STG-Knowledge of positive coping patterns  Outcome: Ongoing                                                                      Group Therapy Note     Date: 10/10/2015  Start Time: 1000  End Time:  1100  Number of Participants: 6     Type of Group: Healthy Living/Wellness     Wellness Binder Information  Module Name:  Physical Wellness  Session Number:  6     Patient's Goal:  To improve knowledge of the benefits of exercise.     Notes:  Pt participated in group activity/discussion, participated in exercise video, discussed the physical and mental benefits of exercise.     Status After Intervention:  Unchanged     Participation Level: Interactive     Participation Quality: Attentive        Speech:  normal        Thought Process/Content: Logical        Affective Functioning: Flat        Mood: anxious and depressed        Level of consciousness:  Alert and Oriented x4        Response to Learning: Able to verbalize current knowledge/experience, Able to verbalize/acknowledge new learning and Progressing to goal        Endings: None Reported     Modes of Intervention: Education        Discipline Responsible: Psychoeducational Specialist        Signature:  Clydell Hakim

## 2015-10-10 NOTE — Plan of Care (Signed)
Problem: Altered Mood, Depressive Behavior  Goal: STG-Knowledge of positive coping patterns  Outcome: Ongoing                                                                      Group Therapy Note     Date: 10/10/2015  Start Time: 1100  End Time:  1200  Number of Participants: 5     Type of Group: Psychoeducation     Wellness Binder Information  Module Name:  Women's Issues  Session Number:  2     Patient's Goal:  To be able to identify strategies to improve self-esteem     Notes:  Pt participated in group discussion r/t the importance of a healthy self-esteem, identified strategies to improve self-esteem.     Status After Intervention:  Unchanged     Participation Level: Interactive     Participation Quality: Attentive        Speech:  normal        Thought Process/Content: Logical        Affective Functioning: Flat        Mood: anxious and depressed        Level of consciousness:  Alert and Oriented x4        Response to Learning: Able to verbalize current knowledge/experience, Able to verbalize/acknowledge new learning and Progressing to goal        Endings: None Reported     Modes of Intervention: Education        Discipline Responsible: Psychoeducational Specialist        Signature:  Clydell Hakim

## 2015-10-10 NOTE — Progress Notes (Signed)
10/10/2015 1:00 PM   Progress Note        Joyce Cohen 1955/11/04  Psychotherapy Time Spent: 20 min      Psychotherapy Topics: health    Chief Complaint   Patient presents with   ??? Mental Health Problem     denies si/hi   ??? Anxiety       Subjective:  The patient is a 60 year old white female admitted via the ER due to increased anxiety, no sleep and manic like symptoms. The patient has a history of bipolar disorder and episodes of hitting herself in the head with a hammer or other objects when she is upset and manic. Today the patient describes a slightly improved mood. She denies SI. Calmer but still with only slightly improved sleep.     Patient reports side effects as follows: none.  Reports no suicidal ideation.  Reports compliance with medications as good .     Review of Systems - Negative except for poor sleep  History obtained from chart review and the patient  Psychological ROS: positive for - mood swings and sleep disturbances  14 point review of systems is negative except for above  Current Meds:    Prior to Admission medications    Medication Sig Start Date End Date Taking? Authorizing Provider   HUMALOG 100 UNIT/ML injection vial  09/01/15  Yes Historical Provider, MD   Lancets MISC  08/31/15  Yes Historical Provider, MD   traMADol (ULTRAM) 50 MG tablet Take 1 tablet by mouth every 8 hours as needed for Pain One month supply  Patient taking differently: Take 50 mg by mouth every 8 hours as needed for Pain Indications: only 45 tables per prescription One month supply 06/29/15  Yes Beatrix Fetters, MD   NONFORMULARY Arthritis hand ointment   Yes Historical Provider, MD   albuterol sulfate HFA (VENTOLIN HFA) 108 (90 BASE) MCG/ACT inhaler Inhale 2 puffs into the lungs every 4 hours as needed for Wheezing 04/09/15  Yes Lyndee Hensen, APRN   Ospemifene 60 MG TABS Take 1 tablet by mouth daily 03/27/15  Yes Gaye Alken, MD   QUEtiapine (SEROQUEL) 400 MG tablet Take 200 mg by mouth nightly Indications: 200 mg at  bedtime    Yes Historical Provider, MD   carbidopa-levodopa (SINEMET) 10-100 MG per tablet Take 1 tablet by mouth nightly Indications: 1 to 2 tablets at bedtime    Yes Historical Provider, MD   montelukast (SINGULAIR) 10 MG tablet Take 10 mg by mouth nightly   Yes Historical Provider, MD   vitamin D (ERGOCALCIFEROL) 50000 UNITS CAPS capsule Take 50,000 Units by mouth once a week  07/13/14  Yes Historical Provider, MD   ranitidine (ZANTAC) 150 MG tablet Take 150 mg by mouth 2 times daily  07/13/14  Yes Historical Provider, MD   traZODone (DESYREL) 100 MG tablet Take 1 tablet by mouth nightly as needed for Sleep  Patient taking differently: Take 300 mg by mouth nightly Indications: 1 to 3 tablets for sleep at night  05/16/14  Yes Maylene Roes, MD   OXcarbazepine (TRILEPTAL) 150 MG tablet Take 1 tablet by mouth 2 times daily 05/16/14  Yes Maylene Roes, MD   lubiprostone (AMITIZA) 24 MCG capsule Take 1 capsule by mouth 2 times daily (with meals) 03/17/14  Yes Percival Spanish, APRN   glipiZIDE (GLUCOTROL) 10 MG tablet Take 1 tablet by mouth every morning (before breakfast) 02/07/14  Yes Jeanene Erb, DO  buPROPion SR (WELLBUTRIN SR) 150 MG SR tablet Take 1 tablet by mouth daily  Patient taking differently: Take 300 mg by mouth daily  05/16/14   Maylene Roes, MD       @    MSE:  Patient is  A & O x3.  Appearance:  improved  Cognition:  Recent memory intact , remote memory intact , fair fund of knowledge,  average intelligence level.   Speech:  normal  Language: Naming: intact; Word Finding: intact  Conversation no evidence of delusions and mood congruent  Behavior:  Cooperative and Sensitive  Mood: anxious  Affect: congruent with mood  Thought Content: negative   Thought Process: goal directed and more organized today  Judgement Insight:  improved and improving  Gait and Station:normal gait and station   Musculoskeletal:no issues    Assesment:   1. Severe episode of recurrent major depressive disorder,  without psychotic features (HCC)    2. Anxiety state        Plan:  1. The risks, benefits, side effects, indications, contraindications, and adverse effects of the medications have been discussed.  2. The pt has verbalized understanding and has capacity to give informed consent.  3. The Zadie Rhine report has been reviewed according to [facility] regulations.  4. Supportive therapy offered.       Orders Placed This Encounter   Medications   ??? acetaminophen (TYLENOL) tablet 650 mg   ??? magnesium hydroxide (MILK OF MAGNESIA) 400 MG/5ML suspension 30 mL   ??? DISCONTD: traZODone (DESYREL) tablet 100 mg   ??? albuterol sulfate HFA 108 (90 Base) MCG/ACT inhaler 2 puff   ??? DISCONTD: buPROPion [facility] SR) extended release tablet 300 mg   ??? DISCONTD: carbidopa-levodopa (SINEMET) 10-100 MG per tablet 1 tablet   ??? glipiZIDE (GLUCOTROL) tablet 10 mg   ??? montelukast (SINGULAIR) tablet 10 mg   ??? DISCONTD: Ospemifene TABS 1 tablet   ??? OXcarbazepine (TRILEPTAL) tablet 150 mg   ??? DISCONTD: QUEtiapine (SEROQUEL) tablet 200 mg   ??? famotidine (PEPCID) tablet 20 mg   ??? DISCONTD: traMADol (ULTRAM) tablet 50 mg   ??? vitamin D (ERGOCALCIFEROL) capsule 50,000 Units   ??? DISCONTD: carbidopa-levodopa (SINEMET) 10-100 MG per tablet 1 tablet   ??? DISCONTD: carbidopa-levodopa (SINEMET) 10-100 MG per tablet 2 tablet   ??? insulin lispro (HUMALOG) injection vial 0-6 Units   ??? glucose (GLUTOSE) 40 % oral gel 15 g   ??? dextrose 50 % solution 12.5 g   ??? glucagon (rDNA) injection 1 mg   ??? dextrose 5 % solution   ??? DISCONTD: carbidopa-levodopa (SINEMET) 10-100 MG per tablet 2 tablet   ??? DISCONTD: carbidopa-levodopa (SINEMET) 10-100 MG per tablet 1 tablet   ??? OLANZapine zydis (ZYPREXA) disintegrating tablet 10 mg   ??? melatonin tablet 3 mg        Orders Placed This Encounter   Procedures   ??? Urine Culture     Standing Status:   Standing     Number of Occurrences:   1   ??? CT Head WO Contrast     Standing Status:   Standing     Number of Occurrences:   1   ??? Comprehensive  Metabolic Panel     Standing Status:   Standing     Number of Occurrences:   1   ??? CBC Auto Differential     Standing Status:   Standing     Number of Occurrences:   1   ??? Magnesium  Standing Status:   Standing     Number of Occurrences:   1   ??? Urinalysis     Standing Status:   Standing     Number of Occurrences:   1   ??? Urine Drug Screen     Standing Status:   Standing     Number of Occurrences:   1   ??? Ethanol     Standing Status:   Standing     Number of Occurrences:   1   ??? Salicylate     Standing Status:   Standing     Number of Occurrences:   1   ??? Acetaminophen Level     Standing Status:   Standing     Number of Occurrences:   1   ??? Microscopic Urinalysis     Standing Status:   Standing     Number of Occurrences:   1   ??? Vitamin D 25 Hydroxy     Standing Status:   Standing     Number of Occurrences:   1   ??? Vitamin B12     Standing Status:   Standing     Number of Occurrences:   1   ??? TSH without Reflex     Standing Status:   Standing     Number of Occurrences:   1   ??? DIET CARB CONTROL; Carb Control: 5 carbs/meal (approximate 2000 kcals/day)     ALLERGY TO CABBAGE     Standing Status:   Standing     Number of Occurrences:   1     Order Specific Question:   Carb Control     Answer:   5 carbs/meal (approximate 2000 kcals/day)   ??? Vital signs per unit routine     Standing Status:   Standing     Number of Occurrences:   1   ??? Up as tolerated     Standing Status:   Standing     Number of Occurrences:   99999   ??? Observation     Q15 minute checks and suicide precautions     Standing Status:   Standing     Number of Occurrences:   1   ??? Search Patient     If not completed in ED.  Remove patient belongings from the room.     Standing Status:   Standing     Number of Occurrences:   1   ??? Tobacco cessation education     Standing Status:   Standing     Number of Occurrences:   1   ??? Weigh patient     Wednesdays weight weekly     Standing Status:   Standing     Number of Occurrences:   1   ??? Nursing communication      [facility]06[facility]2015 at 1700 LRiveraRN     Standing Status:   Standing     Number of Occurrences:   1   ??? HYPOGLYCEMIA TREATMENT: blood glucose less than 50 mg/dL and patient  ALERT and TOLERATING PO     Give 8 ounces juice or regular soda or 2 tubes glucose gel. Repeat blood glucose in 15 minutes. If blood glucose is less than 70 mg/dL, repeat treatment and recheck blood glucose in 15 minutes x2 and notify provider.     Standing Status:   Standing     Number of Occurrences:   99999   ??? HYPOGLYCEMIA TREATMENT: blood glucose less  than 70 mg/dL and patient ALERT and TOLERATING PO     Give 4 ounces juice or regular soda or 1 tube glucose gel. Repeat blood glucose in 15 minutes. If blood glucose is less than 70 mg/dL, repeat treatment and recheck blood glucose in 15 minutes x2. If blood glucose remains less than 70 mg/dL, notify provider.     Standing Status:   Standing     Number of Occurrences:   99999   ??? HYPOGLYCEMIA TREATMENT: blood glucose less than 70 mg/dL and patient NOT ALERT or NPO     Give dextrose 50% intravenous. If patient does not respond within 5 minutes, repeat dose x1.  Start D5W at 100 mL/hour until ordering provider can be reached. Repeat blood glucose in 15 minutes. If blood glucose is less than 70 mg/dL, repeat treatment and recheck blood glucose in 15 minutes x2.  Notify provider. If no intravenous access, administer glucagon 1 mg. After administration, attempt intravenous access and start D5W at 100 mL/hr. Repeat blood glucose in 15 minutes x2 and notify provider.     Standing Status:   Standing     Number of Occurrences:   99999   ??? Full Code     Standing Status:   Standing     Number of Occurrences:   1   ??? Inpatient consult to Psychiatry     Standing Status:   Standing     Number of Occurrences:   1     Order Specific Question:   Reason for Consult?     Answer:   anxiety   ??? Consult to Internal Medicine     Standing Status:   Standing     Number of Occurrences:   1      Order Specific Question:   Reason for Consult?     Answer:   H&P   ??? POCT Glucose     Standing Status:   Standing     Number of Occurrences:   1   ??? POCT Glucose     Standing Status:   Standing     Number of Occurrences:   37   ??? POCT glucose     If blood sugar is below 110 at bedtime, then check blood glucose at 0200.     Standing Status:   Standing     Number of Occurrences:   37   ??? POCT Glucose     Repeat blood glucose 15 minutes following intervention.  If blood glucose is less than 70 mg/dL, repeat treatment and recheck blood glucose in 15 minutes x2.  If blood glucose remains less than 70 mg/dL, call ordering provider for further instruction.   If patient experienced a hypoglycemic event in last 24 hours, obtain blood glucose at 0200.     Standing Status:   Standing     Number of Occurrences:   [id number]   ??? POCT Glucose     Standing Status:   Standing     Number of Occurrences:   1   ??? POCT Glucose     Standing Status:   Standing     Number of Occurrences:   1   ??? POCT Glucose     Standing Status:   Standing     Number of Occurrences:   1   ??? POCT Glucose     Standing Status:   Standing     Number of Occurrences:   1   ??? POCT Glucose     Standing  Status:   Standing     Number of Occurrences:   1   ??? POCT Glucose     Standing Status:   Standing     Number of Occurrences:   1   ??? POCT Glucose     Standing Status:   Standing     Number of Occurrences:   1   ??? POCT Glucose     Standing Status:   Standing     Number of Occurrences:   1   ??? POCT Glucose     Standing Status:   Standing     Number of Occurrences:   1   ??? POCT Glucose     Standing Status:   Standing     Number of Occurrences:   1   ??? POCT Glucose     Standing Status:   Standing     Number of Occurrences:   1   ??? POCT Glucose     Standing Status:   Standing     Number of Occurrences:   1   ??? POCT Glucose     Standing Status:   Standing     Number of Occurrences:   1   ??? POCT Glucose     Standing Status:   Standing     Number of Occurrences:   1   ??? POCT  Glucose     Standing Status:   Standing     Number of Occurrences:   1   ??? PATIENT STATUS (FROM ED OR OR/PROCEDURAL) Inpatient     Standing Status:   Standing     Number of Occurrences:   1     Order Specific Question:   Patient Class     Answer:   Inpatient [101]     Order Specific Question:   REQUIRED: Diagnosis     Answer:   Bipolar I disorder, most recent episode mixed, severe with psychotic features [facility]) [[id number]]     Order Specific Question:   Estimated Length of Stay     Answer:   Estimated stay of more than 2 midnights     Order Specific Question:   Future Attending Provider     AnswerGeorgia Lopes [[id number]]   ??? Suicide precautions     Standing Status:   Standing     Number of Occurrences:   1   ??? Fall precautions     Standing Status:   Standing     Number of Occurrences:   1   ??? Routine visitation status     Standing Status:   Standing     Number of Occurrences:   1            Continue hospitalization for safety and stabilization  Monitor every 15 minutes for safety  ?? Encourage participation in the group therapies  The patient continues to need, on a daily basis, active treatment furnished directly by or requiring the supervision of inpatient psychiatric facility personnel.  Will increase the melatonin              Robby Sermon. Monee Dembeck, Ed.D.D.O.DFAPA

## 2015-10-10 NOTE — Progress Notes (Signed)
Progress Note  Joyce Cohen  10/10/2015 8:38 PM  Subjective:   Admit Date:   10/06/2015      CC/ADMIT DX:       Interval History:   Reviewed overnight events and nursing notes.  She has no new physical concerns.        I have reviewed all labs/diagnostics from the last 24hrs.       ROS:   I have done a 10 point ROS and all are negative, except what is mentioned in the HPI.    DIET CARB CONTROL; Carb Control: 5 carbs/meal (approximate 2000 kcals/day)    Medications:     ??? dextrose       ??? glipiZIDE  10 mg Oral QAM AC   ??? montelukast  10 mg Oral Nightly   ??? OXcarbazepine  150 mg Oral BID   ??? famotidine  20 mg Oral BID   ??? [START ON 10/13/2015] vitamin D  50,000 Units Oral Weekly   ??? insulin lispro  0-6 Units Subcutaneous TID WC   ??? OLANZapine zydis  10 mg Oral Nightly           Objective:   Vitals: BP (!) 140/71   Pulse 94   Temp 98 ??F (36.7 ??C) (Temporal)    Resp 18   Ht 5\' 5"  (1.651 m)   Wt 206 lb 6 oz (93.6 kg)   SpO2 95%   BMI 34.34 kg/m2     Intake/Output Summary (Last 24 hours) at 10/10/15 2038  Last data filed at 10/09/15 2049   Gross per 24 hour   Intake              360 ml   Output                0 ml   Net              360 ml     General appearance: alert and cooperative with exam  Lungs: clear to auscultation bilaterally  Heart: regular rate and rhythm, S1, S2 normal, no murmur, click, rub or gallop  Abdomen: soft, non-tender; bowel sounds normal; no masses,  no organomegaly  Extremities: extremities normal, atraumatic, no cyanosis or edema  Neurologic:  No obvious focal neurologic deficits.    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  Continue present medication(s)   2.  She is medically stable. I will monitor for any changes or concerns.   3.  Follow with  Psych      Discharge planning:   her home     Reviewed treatment plans with the patient and/or family.             Electronically signed by Danny LawlessAribbe A Kelsea Mousel, MD on 10/10/2015 at 8:38 PM

## 2015-10-10 NOTE — Progress Notes (Signed)
[facility] Daily Shift Assessment  Nursing Progress Note    Room: 0616/616-01 Name: Joyce Cohen Age: 60 y.o.   Ethnicity: Caucasian Gender: female   Dx: Bipolar I disorder with mixed features (HCC)  Precautions: suicide risk and fall risk  CPAP: No Accu-Chek: Yes 209 / 257 at HS  MSE:  Status and Exam  Normal: Yes  Facial Expression: Brightened  Affect: Appropriate  Level of Consciousness: Alert  Mood:Normal: No  Mood: Depressed, Anxious  Motor Activity:Normal: Yes  Motor Activity: Increased  Interview Behavior: Cooperative  Preception: Orient to Person, Orient to Time, Orient to Place, Orient to Situation  Attention:Normal: Yes  Attention: Unable to Concentrate  Thought Processes: Circumstantial  Thought Content:Normal: No  Thought Content: Preoccupations  Hallucinations: None  Delusions: No  Memory:Normal: No  Memory: Poor Recent  Insight and Judgment: Yes  Insight and Judgment: Poor Judgment, Poor Insight  Present Suicidal Ideation: No  Present Homicidal Ideation: No  Sleep: Yes, Good, no sleep issues Hours Slept: 8 Sched Sleep Meds: Yes PRN Sleep Meds:yesOther PRN Meds: No Med Compliant: Yes Appetite: good Percent Meals: 100% Social: Yes ADLs: Yes Speech: normal Depression: 0 Anxiety: 0    Pt oriented x4, denies SI,HI and AVH, pt stated no depression and anxiety, pt stated I am ready to go home tomorrow". Will continue to monitor for safety    Hortencia ConradiLora A Smiley Birr, RN

## 2015-10-10 NOTE — Plan of Care (Signed)
Problem: Altered Mood, Depressive Behavior  Goal: LTG-Able to verbalize acceptance of life and situations over which he or she has no control  Outcome: Ongoing  Goal: LTG-Able to verbalize and/or display a decrease in depressive symptoms  Outcome: Ongoing  Goal: STG-Able to verbalize suicidal ideations  Outcome: Ongoing  Goal: STG-Able to verbalize support system  Outcome: Ongoing  Goal: LTG-Absence of self-harm  Outcome: Ongoing  Goal: STG-Knowledge of positive coping patterns  Outcome: Ongoing  Goal: Patient Specific Goal  Outcome: Ongoing  Goal: Participation in care planning  Outcome: Ongoing    Problem: Falls - Risk of:  Goal: Will remain free from falls  Will remain free from falls   Outcome: Ongoing  Goal: Absence of physical injury  Absence of physical injury   Outcome: Ongoing

## 2015-10-10 NOTE — Plan of Care (Signed)
Problem: Altered Mood, Depressive Behavior  Goal: LTG-Able to verbalize acceptance of life and situations over which he or she has no control  Outcome: Ongoing  Goal: LTG-Able to verbalize and/or display a decrease in depressive symptoms  Outcome: Ongoing  Goal: STG-Able to verbalize suicidal ideations  Outcome: Met This Shift  Goal: STG-Able to verbalize support system  Outcome: Ongoing  Goal: LTG-Absence of self-harm  Outcome: Met This Shift  Goal: STG-Knowledge of positive coping patterns  Outcome: Ongoing    Problem: Falls - Risk of:  Goal: Will remain free from falls  Will remain free from falls   Outcome: Met This Shift  Goal: Absence of physical injury  Absence of physical injury   Outcome: Met This Shift

## 2015-10-10 NOTE — Progress Notes (Signed)
SW met with treatment team to discuss pt's progress and setbacks.  SW 2 was present.  Pt reportedly slept fair with medications, appetite is good, attends scheduled group activities, social with peers/staff, reports intermittent irritability/agitation, performs ADL's, compliant with medications, denies depression, reports moderate anxiety, denies SI/HI/AVH, continue current treatment plan.

## 2015-10-10 NOTE — Behavioral Health Treatment Team (Signed)
Group Therapy Note    Date: 10/10/15  Start Time: 0830  End Time:0900    Number of Participants: 5    Type of Group: Unit rules / Goals     Patient's Goal:  " trying to keep from being irritated by everything"    Notes:      Status After Intervention:  Improved    Participation Level: Minimal    Participation Quality: Attentive    Speech:  normal    Thought Process/Content: Logical    Affective Functioning: Blunted    Mood: anxious, depressed and irritable    Level of consciousness:  Oriented x4    Response to Learning: Capable of insight    Modes of Intervention: Education and Support    Discipline Responsible: Registered Nurse    Signature:  Linden Dolin, RN

## 2015-10-10 NOTE — Progress Notes (Signed)
Patient has been in bed the majority of this shift. Patient reports fair rest during the night.

## 2015-10-10 NOTE — Progress Notes (Signed)
Alert and oriented x 4. Denies SI, HI and AVH. Rates depression as improving and anxiety as worse. States slept well.

## 2015-10-10 NOTE — Behavioral Health Treatment Team (Signed)
[facility]                        [address] [place], [place] [address]                                 PSYCHIATRIC ADMISSION NOTE    PATIENT NAME: Joyce Cohen, Joyce Cohen                          DOB:              December 20, 1955  MED REC NO:   [id number]                                         ROOM:              [facility]  ACCOUNT NO:   [id number]                                      ADMISSION DATE:   10/06/2015  PROVIDER:     Georgia Lopes, MD          DATE OF EVALUATION:  10/06/2015    ADMITTING PHYSICIAN:  Georgia Lopes, MD    EVALUATING PHYSICIAN:  Georgia Lopes, MD    IDENTIFYING DATA:  The patient is a 60 year old female, date of birth   [birth year].    CHIEF COMPLAINT:  "I don't really know what happened to me."    HISTORY OF PRESENT ILLNESS:  The patient states that she has been treated   long-term  for bipolar disorder.  She has not had an admission here in several years, but   used  to be admitted regularly and does not know what precipitated her admission   this time.  She came to the hospital because she has had a severe increase in anxiety and   a  desire to harm herself by hitting herself on the head and states she does not   think  she can keep from doing this, in addition to that she had thoughts of taking   her pain  medications and sleeping all the way through the weekend until Tuesday, when   she was  going to be seen and she denies that this is actually a suicide attempt, but   she says  "I'm a chronic insomniac and I just cannot stand it any longer."  She said her  insomnia has also been worse over the last couple of days.  She said it has   been  going on for a while and her trazodone had been increased to 300 mg after it   quit  working.  She states that worked for her for almost 30 years, then Seroquel   was added  and gradually increased and then up to 300 mg at bedtime about a week ago.    She has  also been on Wellbutrin 300 mg a day.  She said she just became  suddenly   depressed  for no reason and she says there is nothing really different going on in her   life and  then today her anxiety and desire to hit herself kicked in.  She said  she is   unable  to sit still.  She said once she starts hitting herself, she usually begins   cutting  on herself and then suicidal thoughts begin to occur after that and she did   not want  to let it progress to that point.  She has been irritable.  She said she has   had no  aggression toward others, just toward herself.  She denies any homicidal   ideation.   She has had no excessive daytime drowsiness.  She said she is unable to   concentrate,  but her thoughts tend to be more slow and racing; although, they are all over   the  place some of the time.  She denies any hallucinations or delusions.  She said   the  last time that she had a full blown manic episode was six years ago, but has   had this  several times in her life.  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  She is having crying spells.  She denies any substance use.  She   does  state that she has had back pain and that she did have an epidural last   Wednesday and  that that may have made things worse, though she has been having these about   every  three months for the last year and a half or so.  The patient has had five   previous  suicide attempts and multiple admissions, she thinks, at least six to [facility], but as stated above, it has been a long time since she has   had to  be admitted since she has been stable on her current medications.    PAST MEDICAL HISTORY:  She said she was diagnosed with peptic ulcer disease as   a  child.  SHE IS ALLERGIC TO TOBACCO, PSEUDOEPHEDRINE, IBUPROFEN, AND CABBAGE   CAUSES  HER NAUSEA AND VOMITING.  She has a history of torn right rotator cuff right  shoulder, constipation chronically and hemorrhoids, hyperlipidemia, mitral   valve  prolapse, COPD with history of pleurisy,  degenerative joint disease,   congenital  absence of one kidney, bilateral hearing loss, skin cancer, tubal ligation,  tonsillectomy and adenoidectomy, extraction of wisdom teeth, hysterectomy,  cholecystectomy, appendectomy, and exploratory laparotomy for adhesions.  Only  current complaints are listed in history of present illness and the patient in   review  of systems also continues to complain of some back pain.    SOCIAL AND FAMILY HISTORY:  The patient's father was diagnosed with bipolar   disorder  and committed suicide.  The patient's mother married a man who _____ the   patient from  age 3.  The patient states she is now at peace with this, but suffered much  through her life because of this, but was finally able to reach a spiritual   place  where she forgets of her stepfather and her mother.  A great uncle on her   mother's  side also committed suicide.  There is a family history of alcohol dependence   as  well.  The patient states her husband is supportive and not abusive.  She is  disabled.    MENTAL STATUS EXAMINATION:  The patient is in hospital scrubs, appears   disheveled,  but is alert and cooperative.  Mood and affect are depressed.  She is mildly  irritable at times and is very  anxious.  Mood and affect are congruent.  Motor  activity, the patient was able to sit in chair throughout interview; however,   she  describes motor restlessness, though not much was seen during the interview   and there  are no specific stereotyped adventitious movement.  Speech is fluent,   pressured at  times and thoughts show flight of ideas and are racing at times.  She is   having  difficulty organizing her thoughts.  There is nothing overtly bizarre in her   thought  content.  She is oriented x3.  Based on historical data, memory is intact in   all  spheres, immediate, recent, and remote.  Vocabulary, fund of information,   ability to  abstract all indicate at least average intellectual functioning.  The  patient   has  some insight into her psychiatric disorders, but not much idea of current  precipitating factors.  Judgment is overridden by mood to the point that she   is  dangerous in terms of both self-harm behaviors and potentially taking an   overdose.    CLINICAL IMPRESSION:  Bipolar 1 disorder, most recent episode mixed, severe   without  overt psychotic features, posttraumatic stress disorder, borderline   personality  disorder, multiple medical issues.    TREATMENT PLAN:  1.  Discussed with the patient how corticosteroids can activate bipolar   chemistry and  the patient does not feel this is the issue as she has had epidurals in the   past.  2.  Agreeable to a trial of Zyprexa as a rescue drug as the patient has had   quick  response short-term with this medication in the past and will start at 10 mg.    The  patient understands risks including hyperlipidemia, hyperglycemia, and tardive  dyskinesia and is agreeable.  Dosage may need to be titrated upwards if not   sleeping  and having settling of mood after first dose or two.  3.  Discontinue Seroquel.  4.  Consider Trileptal as the patient has not tried this previously for mood  stabilization and may help anxiety and help some with pain management as well.     Obtain collateral information from family to look for precipitants for current  admission as the patient had been stable for a long period of time.        Georgia Lopes, MD    D: 10/10/2015 11:55:29       T: 10/10/2015 12:03:30     [id number]  Job#: [id number]     Doc#: [id number]

## 2015-10-11 LAB — POCT GLUCOSE
POC Glucose: 257 mg/dl — ABNORMAL HIGH (ref 70–99)
POC Glucose: 146 mg/dl — ABNORMAL HIGH (ref 70–99)
POC Glucose: 131 mg/dl — ABNORMAL HIGH (ref 70–99)

## 2015-10-11 LAB — HEMOGLOBIN A1C: Hemoglobin A1C: 6.4 % — ABNORMAL HIGH

## 2015-10-11 LAB — LIPID PANEL
Cholesterol, Total: 134 mg/dL — ABNORMAL LOW (ref 160–199)
HDL: 42 mg/dL — ABNORMAL LOW (ref 65–121)
LDL Calculated: 53 mg/dL (ref <100)
Triglycerides: 193 mg/dL (ref 150–199)

## 2015-10-11 MED ORDER — OLANZAPINE 10 MG PO TBDP
10 MG | ORAL_TABLET | Freq: Every evening | ORAL | 3 refills | Status: DC
Start: 2015-10-11 — End: 2015-10-20

## 2015-10-11 MED ORDER — ALBUTEROL SULFATE HFA 108 (90 BASE) MCG/ACT IN AERS
108 (90 Base) MCG/ACT | RESPIRATORY_TRACT | 3 refills | Status: AC | PRN
Start: 2015-10-11 — End: ?

## 2015-10-11 MED ORDER — MELATONIN 3 MG PO TABS
3 MG | ORAL_TABLET | Freq: Every evening | ORAL | 3 refills | Status: DC | PRN
Start: 2015-10-11 — End: 2016-01-05

## 2015-10-11 MED ORDER — FAMOTIDINE 20 MG PO TABS
20 MG | ORAL_TABLET | Freq: Two times a day (BID) | ORAL | 3 refills | Status: AC
Start: 2015-10-11 — End: ?

## 2015-10-11 MED ORDER — ACETAMINOPHEN 325 MG PO TABS
325 MG | ORAL_TABLET | ORAL | 3 refills | Status: AC | PRN
Start: 2015-10-11 — End: ?

## 2015-10-11 MED ORDER — OXCARBAZEPINE 150 MG PO TABS
150 MG | ORAL_TABLET | Freq: Two times a day (BID) | ORAL | 3 refills | Status: DC
Start: 2015-10-11 — End: 2016-02-21

## 2015-10-11 MED FILL — OLANZAPINE 10 MG PO TBDP: 10 MG | ORAL | Qty: 1

## 2015-10-11 MED FILL — MELATONIN 3 MG PO TABS: 3 MG | ORAL | Qty: 3

## 2015-10-11 MED FILL — OXCARBAZEPINE 150 MG PO TABS: 150 MG | ORAL | Qty: 1

## 2015-10-11 MED FILL — GLIPIZIDE 10 MG PO TABS: 10 MG | ORAL | Qty: 1

## 2015-10-11 MED FILL — FAMOTIDINE 20 MG PO TABS: 20 MG | ORAL | Qty: 1

## 2015-10-11 MED FILL — MONTELUKAST SODIUM 10 MG PO TABS: 10 MG | ORAL | Qty: 1

## 2015-10-11 NOTE — Behavioral Health Treatment Team (Signed)
Bridge Appointment completed: Reviewed Discharge Instructions with patient.    Patient verbalizes understanding and agreement with the discharge plan using the teachback method.     Referral for Outpatient Tobacco Cessation Counseling, upon discharge (mark X if applicable and completed):    ( )  Hospital staff assisted patient to call Quit Line or faxed referral                                   during hospitalization                  ( )  Recognizing danger situations (included triggers and roadblocks), if not completed on admission                    ( )  Coping skills (new ways to manage stress, exercise, relaxation techniques, changing routine, distraction), if not completed on admission                                                           ( )  Basic information about quitting (benefits of quitting, techniques in how to quit, available resources, if not completed on admission  ( ) [facility]   ( ) Patient refused referral  ( ) Patient refused counseling  (x ) Patient refused smoking cessation medication upon discharge    Vaccinations (mark X if applicable and completed):  ( ) Patient states already received influenza vaccine elsewhere  ( ) Patient received influenza vaccine during this hospitalization  (x ) Patient refused influenza vaccine at this time

## 2015-10-11 NOTE — Progress Notes (Signed)
SW met with treatment team to discuss pt's progress and setbacks.  SW 2 was present.  Pt reportedly slept fair last night, appetite is good, attends scheduled group activities, social with peers/staff, performs ADL's, compliant with medications, denies depression/anxiety, denies SI/HI/AVH, states she is ready to go home, will be discharged today, follow-up appointments will be scheduled.

## 2015-10-11 NOTE — Progress Notes (Signed)
Discharge Note    Pt discharging on this date. Pt denies SI, HI, and AVH at this time. Pt reports improvement in behavior and is leaving unit in overall good condition. Pt reports she will be picked up by husband upon discharge from unit. Pt to follow up with [facility] in [place] on 10/17/15 at 11 AM for therapy and again on 10/31/15 at 4:30 PM with psychiatrist. CSW and pt discussed pt's follow up appointments and importance of attending appointments as scheduled, pt voiced understanding and agreement. Pt and CSW also discussed pt safety plan and pt able to verbally identify: warning signs, coping strategies, places and people that help make the pt feel better/distract negative thoughts, friends/family/agencies/professionals the pt can reach out to in a crisis, and something that is important to the pt/worth living for. Pt provided the national suicide prevention hotline number 859-120-2307) as well as local community behavioral health [facility]) crisis number for emergencies [phone]), in d/c folder.   Patient refused tobacco cessation counseling    Electronically signed by Thermon Leyland, CSW on 10/11/2015 at 10:01 AM

## 2015-10-11 NOTE — Discharge Instructions (Signed)
Pt presented to ED with depression and anxiety stating medications may need to be readjusted or changed.

## 2015-10-11 NOTE — Discharge Summary (Signed)
Discharge Summary     Patient ID:  Joyce Cohen  [id number]  [id number]  60 y.o.  07/31/1955    Admit date: 10/06/2015  Discharge date: 10/11/2015    Admitting Physician: Georgia Lopesana McGaffee, MD   Attending Physician: Dr. Louie CasaLaurie Jaymarion Trombly Discharge Physician: Jeanene ErbLaurie Kay Ciaran Begay DO    Admission Diagnoses: Bipolar I disorder, most recent episode mixed, severe with psychotic features [facility](HCC) [F31.64]    Discharge Diagnoses: Bipolar I disorder, most recent episode mixed, severe with psychotic features    Admission Condition: poor    Discharged Condition: stable    Indication for Admission: The patient was admitted due to no sleep, mixed manic episode.    Hospital Course: The patient is a 60 year old white female admitted to the hospital via the ER due to a mixed manic episode with no sleep for several days. The patient had recently been on the BHI unit for similar reasons . The patient was acclimated to the unit with no issues. The patients medications were changed to include zyprexa as a mood stabilizer and antipsychotic. She received a lipid panel which indicated total cholesterol of 134 slightly low, triglycerides were at 193, HDL of 42 and LDL of 53, no elevated noted. The patient was also started on melatonin for sleep and was eventually able to get a solid 5 hours of sleep. Pt encouraged to continue on the melatonin. The patient participated in the group therapies, she calmed, was no longer labile and was requested to be discharged. On the day of discharge the patient was stable in mood, not suicidal or homicidal, not psychotic and had improved sleep. The patient was discharged home in improved condition with the plan to follow up with [facility] in [place].    Consults: Dr. Daphine DeutscherMartin for H & P and routine medical care    Significant Diagnostic Studies: labs: routine    Treatments: therapies: RN and SW    Discharge Exam:  MSE: Alert and oriented in all areas  Speech is clear  Mood is good  Affect is congruent  No Si or HI  NOpsychosis  of any kind  Judgement and Insight are intact  Cognition is intact  Gait and Station are WNL    Disposition: home    Patient Instructions:   Current Discharge Medication List      START taking these medications    Details   acetaminophen (TYLENOL) 325 MG tablet Take 2 tablets by mouth every 4 hours as needed for Pain  Qty: 120 tablet, Refills: 3      OLANZapine zydis (ZYPREXA) 10 MG disintegrating tablet Take 1 tablet by mouth nightly  Qty: 30 tablet, Refills: 3      melatonin 3 MG TABS tablet Take 3 tablets by mouth nightly as needed (sleep)  Qty: 90 tablet, Refills: 3      famotidine (PEPCID) 20 MG tablet Take 1 tablet by mouth 2 times daily  Qty: 60 tablet, Refills: 3         CONTINUE these medications which have CHANGED    Details   albuterol sulfate HFA (VENTOLIN HFA) 108 (90 Base) MCG/ACT inhaler Inhale 2 puffs into the lungs every 4 hours as needed for Wheezing  Qty: 1 Inhaler, Refills: 3      OXcarbazepine (TRILEPTAL) 150 MG tablet Take 1 tablet by mouth 2 times daily  Qty: 60 tablet, Refills: 3         CONTINUE these medications which have NOT CHANGED    Details  HUMALOG 100 UNIT/ML injection vial       Lancets MISC       NONFORMULARY Arthritis hand ointment      Ospemifene 60 MG TABS Take 1 tablet by mouth daily  Qty: 90 tablet, Refills: 3      carbidopa-levodopa (SINEMET) 10-100 MG per tablet Take 1 tablet by mouth nightly Indications: 1 to 2 tablets at bedtime       montelukast (SINGULAIR) 10 MG tablet Take 10 mg by mouth nightly      vitamin D (ERGOCALCIFEROL) 50000 UNITS CAPS capsule Take 50,000 Units by mouth once a week       traZODone (DESYREL) 100 MG tablet Take 1 tablet by mouth nightly as needed for Sleep  Qty: 30 tablet, Refills: 0      lubiprostone (AMITIZA) 24 MCG capsule Take 1 capsule by mouth 2 times daily (with meals)  Qty: 60 capsule, Refills: 11    Associated Diagnoses: Chronic constipation      glipiZIDE (GLUCOTROL) 10 MG tablet Take 1 tablet by mouth every morning (before  breakfast)  Qty: 60 tablet, Refills: 3      buPROPion SR (WELLBUTRIN SR) 150 MG SR tablet Take 1 tablet by mouth daily  Qty: 60 tablet, Refills: 3         STOP taking these medications       traMADol (ULTRAM) 50 MG tablet Comments:   Reason for Stopping:         QUEtiapine (SEROQUEL) 400 MG tablet Comments:   Reason for Stopping:         ranitidine (ZANTAC) 150 MG tablet Comments:   Reason for Stopping:             Activity: activity as tolerated  Diet: regular diet  Wound Care: none needed    Follow-up with [facility] in 1 week.    Time worked: More than 31 minutes    Participation:good    Electronically signed by Jeanene ErbLaurie Kay Sherrica Niehaus, DO on 10/11/2015 at 9:35 AM

## 2015-10-11 NOTE — Progress Notes (Signed)
Patient has been in bed the majority of this shift. Patient was up about 0153. Patient reports she does have trouble sleeping at times. Patient is currently awake reading her book in bed.

## 2015-10-11 NOTE — Progress Notes (Signed)
[facility]  Discharge Note    Pt discharged with followings belongings:   Dentures: None  Vision - Corrective Lenses: Glasses  Hearing Aid: None  Jewelry: None  Body Piercings Removed: N/A  Clothing: Footwear, Pants, Shirt, Undergarments (Comment), Socks  Were All Patient Medications Collected?: Not Applicable  Other Valuables: None   Valuables sent home with pt. Valuables retrieved from safe and returned to patient.  Patient left department with Departure Mode: With spouse, In private custody via Mobility at Departure: Ambulatory, discharged to Discharged to: Private Residence. Patient education on aftercare instructions: yes  Patient verbalize understanding of AVS:  yes.    Status EXAM upon discharge:  Status and Exam  Normal: Yes  Facial Expression: Brightened  Affect: Appropriate  Level of Consciousness: Alert  Mood:Normal: Yes  Mood: Anxious  Motor Activity:Normal: Yes  Motor Activity: Increased  Interview Behavior: Cooperative  Preception: Orient to Person, Orient to Time, Orient to Place, Orient to Situation  Attention:Normal: Yes  Attention: Unable to Concentrate  Thought Processes: Circumstantial  Thought Content:Normal: No  Thought Content: Preoccupations  Hallucinations: None  Delusions: No  Memory:Normal: Yes  Memory: Poor Recent  Insight and Judgment: Yes  Insight and Judgment: Poor Judgment, Poor Insight  Present Suicidal Ideation: No  Present Homicidal Ideation: No    Aquilla Solian, RN

## 2015-10-11 NOTE — Plan of Care (Signed)
Problem: Altered Mood, Depressive Behavior  Goal: LTG-Able to verbalize acceptance of life and situations over which he or she has no control  Outcome: Ongoing                                                                      Group Therapy Note     Date: 10/11/2015  Start Time: 1100  End Time:  1145  Number of Participants: 4     Type of Group: Psychotherapy           Patient's Goal:  Process emotions and actions focusing on how to heal and improve relationships with others.        Notes:  Pt attended agenda group as scheduled. Pt participated by identifying an issue pt would like to work on today regarding interacting/relating with others. Pt participated in group discussion exploring issues identified by group members and pt.        Status After Intervention:  Improved     Participation Level: Active Listener and Interactive     Participation Quality: Appropriate, Attentive, Sharing and Supportive        Speech:  normal        Thought Process/Content: Logical        Affective Functioning: Congruent        Mood: depressed        Level of consciousness:  Alert, Oriented x4 and Attentive        Response to Learning: Able to verbalize current knowledge/experience, Able to verbalize/acknowledge new learning, Able to retain information, Capable of insight, Able to change behavior and Progressing to goal        Endings: None Reported     Modes of Intervention: Support, Socialization, Exploration and Clarifying        Discipline Responsible: Social Worker/Counselor        Signature:  Thermon Leyland, CSW

## 2015-10-11 NOTE — Plan of Care (Signed)
Problem: Altered Mood, Depressive Behavior  Goal: STG-Knowledge of positive coping patterns  Outcome: Ongoing                                                                      Group Therapy Note     Date: 10/11/2015  Start Time: 1000  End Time:  1045  Number of Participants: 4     Type of Group: Psychoeducation     Wellness Binder Information  Module Name:  Stress  Session Number:  5     Patient's Goal:  Patient will learn additional stress coping strategies as well as, participate in in activity provided.      Notes:  Pt attended group as offered. Pt participated in discussion regarding stress coping strategies and able to identify helpful coping skills used in daily life. Pt participated in adult coloring activity provided.     Status After Intervention:  Improved     Participation Level: Active Listener and Interactive     Participation Quality: Appropriate, Attentive, Sharing and Supportive        Speech:  normal        Thought Process/Content: Logical        Affective Functioning: Congruent        Mood: depressed        Level of consciousness:  Alert, Oriented x4 and Attentive        Response to Learning: Able to verbalize current knowledge/experience, Able to verbalize/acknowledge new learning, Able to retain information, Capable of insight, Able to change behavior and Progressing to goal        Endings: None Reported     Modes of Intervention: Education, Support, Socialization, Exploration and Activity        Discipline Responsible: Teaching laboratory technician and Research scientist (physical sciences) Health Tech        Signature:  Thermon Leyland, CSW

## 2015-10-11 NOTE — Progress Notes (Signed)
Progress Note  Joyce Cohen  10/11/2015 10:31 PM  Subjective:   Admit Date:   10/06/2015      CC/ADMIT DX:       Interval History:   Reviewed overnight events and nursing notes.  She denies any new medical issues.,.       I have reviewed all labs/diagnostics from the last 24hrs.       ROS:   I have done a 10 point ROS and all are negative, except what is mentioned in the HPI.         Medications:               Objective:   Vitals: BP 131/88   Pulse 91   Temp 98.4 ??F (36.9 ??C) (Temporal)    Resp 18   Ht 5\' 5"  (1.651 m)   Wt 211 lb 0.8 oz (95.7 kg)   SpO2 92%   BMI 35.12 kg/m2   No intake or output data in the 24 hours ending 10/11/15 2231  General appearance: alert and cooperative with exam  Lungs: clear to auscultation bilaterally  Heart: regular rate and rhythm, S1, S2 normal, no murmur, click, rub or gallop  Abdomen: soft, non-tender; bowel sounds normal; no masses,  no organomegaly  Extremities: extremities normal, atraumatic, no cyanosis or edema  Neurologic:  No obvious focal neurologic deficits.    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  Continue present medication(s)   2.  She is medically stable for d/c. F/U wth PCP in a few weeks, sooner for any changes or concerns.   3.  Follow with Psych      Discharge planning:   her home     Reviewed treatment plans with the patient and/or family.             Electronically signed by Joyce LawlessAribbe A Shatasha Lambing, MD on 10/11/2015 at 10:31 PM

## 2015-10-17 MED ORDER — OSPEMIFENE 60 MG PO TABS
60 | ORAL_TABLET | Freq: Every day | ORAL | 1 refills | Status: DC
Start: 2015-10-17 — End: 2016-04-05

## 2015-10-20 ENCOUNTER — Inpatient Hospital Stay
Admit: 2015-10-20 | Discharge: 2015-10-21 | Disposition: A | Discharge status: Home / Self Care | Payer: BLUE CROSS/BLUE SHIELD | Attending: Emergency Medicine

## 2015-10-20 DIAGNOSIS — F319 Bipolar disorder, unspecified: Principal | ICD-10-CM

## 2015-10-20 NOTE — ED Provider Notes (Addendum)
[facility] EMERGENCY DEPT  eMERGENCY dEPARTMENT eNCOUnter      Pt Name: Joyce Cohen  MRN: [id number]  Birthdate [birth year]  Date of evaluation: 10/20/2015  Provider: Florentina Addison, MD    CHIEF COMPLAINT       Chief Complaint   Patient presents with   ??? Mental Health Problem     Patient states she has bipolar disorder, has been hearing "whispers," having dizziness, not sleeping   ??? Dizziness         HISTORY OF PRESENT ILLNESS   (Location/Symptom, Timing/Onset, Context/Setting, Quality, Duration, Modifying Factors, Severity)  Note limiting factors.   Joyce Cohen is a 60 y.o. female who presents to the emergency department For mental health complaints.     HPI Comments: 60 year old female with a history of bipolar disorder recent hospitalizations and medication changes. From there complaint she's not been stabilized yet feels he was released too early she's gone back-and-forth on medications from providers changing. Now here with her spouse with complaint of auditory hallucinations and anxiety stress and anger over the situation. Also not sleeping but maybe an hour to hour and a half at night. Not voicing suicidal ideations at this time. Denies any recent metabolic changes such as fever chills infection. Denies alcohol or drug use.    The history is provided by the patient and the spouse.       Nursing Notes were reviewed.    REVIEW OF SYSTEMS    (2-9 systems for level 4, 10 or more for level 5)     Review of Systems   Constitutional: Negative for chills and fever.   HENT: Negative for drooling, facial swelling, nosebleeds, sinus pressure, sore throat and voice change.    Eyes: Negative for discharge.   Respiratory: Negative for apnea and choking.    Cardiovascular: Negative for chest pain and leg swelling.   Gastrointestinal: Negative for blood in stool, constipation, diarrhea and nausea.   Genitourinary: Negative for enuresis.   Musculoskeletal: Negative for joint swelling.   Skin: Negative for rash and  wound.   Neurological: Negative for seizures and syncope.   Psychiatric/Behavioral: Positive for decreased concentration and sleep disturbance. Negative for agitation (she's not agitated but just angry over her situation.), behavioral problems, hallucinations, self-injury and suicidal ideas. The patient is nervous/anxious.    All other systems reviewed and are negative.      A complete review of systems was performed and is negative except as noted above in the HPI.       PAST MEDICAL HISTORY     Past Medical History:   Diagnosis Date   ??? Abdominal pain, right lower quadrant    ??? 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 Arthritis    ??? Bipolar disorder (HCC)    ??? Blood circulation, collateral    ??? Cancer (HCC)     skin cancer   ??? Diabetes mellitus (HCC)    ??? Hearing aid worn     bilateral   ??? Hernia    ??? Hyperlipidemia    ??? MVP (mitral valve prolapse)    ??? Neuromuscular disorder (HCC) Inguinal Nerve pain   ??? Pleurisy without mention of effusion or current tuberculosis    ??? Right groin pain 02/28/2014   ??? Stomach ulcer     as a child   ??? Torn rotator cuff     right shoulder   ??? Unspecified asthma    ??? Unspecified constipation    ???  Unspecified hemorrhoids without mention of complication          SURGICAL HISTORY       Past Surgical History:   Procedure Laterality Date   ??? ABDOMEN SURGERY     ??? ABDOMINAL EXPLORATION SURGERY      times two due to adhesions   ??? APPENDECTOMY     ??? CHOLECYSTECTOMY, LAPAROSCOPIC N/A 02/24/2015    CHOLECYSTECTOMY LAPAROSCOPIC [facility] J [facility]   ??? CHOLECYSTECTOMY, LAPAROSCOPIC     ??? COLON SURGERY  04/2013    1 polyp removed   ??? COLONOSCOPY  05/19/11    Dr Renato Gailseed   ??? COLONOSCOPY  07/15/2012    Dr. Zollie BeckersHendon   ??? HYSTERECTOMY     ??? ROTATOR CUFF REPAIR Right    ??? SKIN BIOPSY  L and R wrists   ??? TONSILLECTOMY     ??? TUBAL LIGATION     ??? WISDOM TOOTH EXTRACTION           CURRENT MEDICATIONS       Previous Medications    ACETAMINOPHEN (TYLENOL) 325 MG TABLET    Take 2  tablets by mouth every 4 hours as needed for Pain    ALBUTEROL SULFATE HFA (VENTOLIN HFA) 108 (90 BASE) MCG/ACT INHALER    Inhale 2 puffs into the lungs every 4 hours as needed for Wheezing    BUPROPION SR (WELLBUTRIN SR) 150 MG SR TABLET    Take 1 tablet by mouth daily    CARBIDOPA-LEVODOPA (SINEMET) 10-100 MG PER TABLET    Take 1 tablet by mouth nightly Indications: 1 to 2 tablets at bedtime     FAMOTIDINE (PEPCID) 20 MG TABLET    Take 1 tablet by mouth 2 times daily    GLIPIZIDE (GLUCOTROL) 10 MG TABLET    Take 1 tablet by mouth every morning (before breakfast)    HUMALOG 100 UNIT/ML INJECTION VIAL        LANCETS MISC        LUBIPROSTONE (AMITIZA) 24 MCG CAPSULE    Take 1 capsule by mouth 2 times daily (with meals)    MELATONIN 3 MG TABS TABLET    Take 3 tablets by mouth nightly as needed (sleep)    MONTELUKAST (SINGULAIR) 10 MG TABLET    Take 10 mg by mouth nightly    NONFORMULARY    Arthritis hand ointment    OSPEMIFENE 60 MG TABS    Take 1 tablet by mouth daily    OXCARBAZEPINE (TRILEPTAL) 150 MG TABLET    Take 1 tablet by mouth 2 times daily    TRAZODONE (DESYREL) 100 MG TABLET    Take 1 tablet by mouth nightly as needed for Sleep    VITAMIN D (ERGOCALCIFEROL) 50000 UNITS CAPS CAPSULE    Take 50,000 Units by mouth once a week        ALLERGIES     Tobacco [nicotiana tabacum]; Decongestant [pseudoephedrine hcl]; Motrin [ibuprofen]; and Cabbage    FAMILY HISTORY       Family History   Problem Relation Age of Onset   ??? Alcohol Abuse Father    ??? Mental Illness Father    ??? Alcohol Abuse Mother    ??? Mental Illness Mother    ??? Hypertension Mother    ??? Alcohol Abuse Brother    ??? Cancer Paternal Grandfather    ??? Mental Illness Maternal Grandmother    ??? Hypertension Maternal Grandmother    ??? Mental Illness  Daughter    ??? Colon Cancer Neg Hx    ??? Colon Polyps Neg Hx    ??? Liver Cancer Neg Hx    ??? Stomach Cancer Neg Hx    ??? Ulcerative Colitis Neg Hx    ??? Crohn's Disease Neg Hx    ??? Liver Disease Neg Hx    ??? Rectal Cancer  Neg Hx           SOCIAL HISTORY       Social History     Social History   ??? Marital status: Married     Spouse name: Johnnie   ??? Number of children: 2   ??? Years of education: 1815     Social History Main Topics   ??? Smoking status: Never Smoker   ??? Smokeless tobacco: Never Used      Comment: Disabled due bipolar/autism   ??? Alcohol use No   ??? Drug use: No   ??? Sexual activity: Not Asked     Other Topics Concern   ??? None     Social History Narrative       SCREENINGS             PHYSICAL EXAM    (up to 7 for level 4, 8 or more for level 5)   ED Triage Vitals   BP Temp Temp Source Pulse Resp SpO2 Height Weight   10/20/15 2044 10/20/15 2044 10/20/15 2044 10/20/15 2044 10/20/15 2044 10/20/15 2044 10/20/15 1928 10/20/15 1928   128/55 98.2 ??F (36.8 ??C) Temporal 95 18 98 % 5\' 5"  (1.651 m) 215 lb (97.5 kg)       Physical Exam   Constitutional: She is oriented to person, place, and time. She appears well-developed. No distress.   HENT:   Head: Normocephalic and atraumatic.   Nose: Nose normal.   Mouth/Throat: Oropharynx is clear and moist.   Eyes: EOM are normal. Pupils are equal, round, and reactive to light.   Neck: Normal range of motion. Neck supple.   Cardiovascular: Normal rate, regular rhythm, normal heart sounds and intact distal pulses.    No murmur heard.  Pulmonary/Chest: Effort normal and breath sounds normal.   Abdominal: Soft. Bowel sounds are normal.   Musculoskeletal: Normal range of motion.   Neurological: She is alert and oriented to person, place, and time. She has normal strength. No cranial nerve deficit or sensory deficit. GCS eye subscore is 4. GCS verbal subscore is 5. GCS motor subscore is 6.   Skin: Skin is warm and dry.   Psychiatric: Thought content normal. Her mood appears anxious. Her affect is angry. Cognition and memory are normal.   Nursing note and vitals reviewed.      DIAGNOSTIC RESULTS     EKG: All EKG's are interpreted by the Emergency Department Physician who either signs or Co-signs this  chart in the absence of a cardiologist.        RADIOLOGY:   Non-plain film images such as CT, Ultrasound and MRI are read by the radiologist. Plain radiographic images are visualized and preliminarily interpreted by the emergency physician with the below findings:        Interpretation per the Radiologist below, if available at the time of this note:    No orders to display         ED BEDSIDE ULTRASOUND:   Performed by ED Physician - none    LABS:  Labs Reviewed   CBC WITH AUTO DIFFERENTIAL - Abnormal; Notable for  the following:        Result Value    Platelets 121 (*)     Neutrophils % 42.7 (*)     Monocytes % 10.1 (*)     Eosinophils % 6.1 (*)     All other components within normal limits   COMPREHENSIVE METABOLIC PANEL - Abnormal; Notable for the following:     Glucose 205 (*)     BUN 21 (*)     CREATININE 1.3 (*)     GFR Non-African American 42 (*)     ALT 80 (*)     AST 36 (*)     All other components within normal limits   ETHANOL   URINE DRUG SCREEN   URINALYSIS   ACETAMINOPHEN LEVEL   SALICYLATE LEVEL       All other labs were within normal range or not returned as of this dictation.    EMERGENCY DEPARTMENT COURSE and DIFFERENTIAL DIAGNOSIS/MDM:   Vitals:    Vitals:    10/20/15 1928 10/20/15 2044   BP:  (!) 128/55   Pulse:  95   Resp:  18   Temp:  98.2 ??F (36.8 ??C)   TempSrc:  Temporal   SpO2:  98%   Weight: 215 lb (97.5 kg)    Height: 5\' 5"  (1.651 m)        MDM  Number of Diagnoses or Management Options  Bipolar I disorder with mixed features [facility]):   Chronic renal insufficiency, stage 3 (moderate):   Elevated liver enzymes:   Lumbar disc disease with radiculopathy:   Type 2 diabetes mellitus with hyperglycemia, without long-term current use of insulin [facility]):   Diagnosis management comments: I'm going to medicate this otherwise stable patient for her calm for hopefully she'll get some sleep tonight and feel better tomorrow. She'll have to follow up with her psychiatrist. She had her spouse are encouraged to  follow-up and to return if the symptoms worsen or the patient suicidal or doesn't feel safe to self or others. Patient has prescribed something for sleep. Advised that I couldn't at this time because of her current medications and changes will give her a shot tonight to help. She can contact her doctor as soon as possible.        CONSULTS:  IP CONSULT TO PSYCHIATRY  Psychiatry has evaluated. Did not feel the patient meets criteria for inpatient admission.  PROCEDURES:  Unless otherwise noted below, none     Procedures    FINAL IMPRESSION      1. Bipolar I disorder with mixed features (HCC)    2. Type 2 diabetes mellitus with hyperglycemia, without long-term current use of insulin (HCC)    3. Lumbar disc disease with radiculopathy    4. Chronic renal insufficiency, stage 3 (moderate)    5. Elevated liver enzymes          DISPOSITION/PLAN   DISPOSITION     PATIENT REFERRED TO:        Follow-up with your psychiatrist.      DISCHARGE MEDICATIONS:  New Prescriptions    No medications on file          (Please note that portions of this note were completed with a voice recognition program.  Efforts were made to edit the dictations but occasionally words are mis-transcribed.)    Florentina Addison, MD (electronically signed)  Attending Emergency Physician         Florentina Addison, MD  10/20/15 2248  Florentina Addison, MD  10/20/15 [phone]

## 2015-10-20 NOTE — ED Notes (Signed)
Pt cooperative.  Waiting evaluation     Sunday Corn  10/20/15 2101

## 2015-10-20 NOTE — ED Notes (Signed)
Waiting discharge papers     Martyn MalayKristy M Carter Kaman, RN  10/20/15 2258

## 2015-10-20 NOTE — Discharge Instructions (Signed)
Return if necessary. Any suicidal ideation requires immediate evaluation. If you feel are not safe to yourself or to others this should be evaluated immediately.
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
## 2015-10-21 LAB — CBC WITH AUTO DIFFERENTIAL
Basophils %: 0.8 % (ref 0.0–1.0)
Basophils Absolute: 0.1 10*3/uL (ref 0.00–0.20)
Eosinophils %: 6.1 % — ABNORMAL HIGH (ref 0.0–5.0)
Eosinophils Absolute: 0.5 10*3/uL (ref 0.00–0.60)
Hematocrit: 42.7 % (ref 37.0–47.0)
Hemoglobin: 14.6 g/dL (ref 12.0–16.0)
Lymphocytes %: 40 % (ref 20.0–40.0)
Lymphocytes Absolute: 3.1 10*3/uL (ref 1.1–4.5)
MCH: 30 pg (ref 27.0–31.0)
MCHC: 34.2 g/dL (ref 33.0–37.0)
MCV: 87.7 fL (ref 81.0–99.0)
MPV: 11 fL (ref 9.4–12.3)
Monocytes %: 10.1 % — ABNORMAL HIGH (ref 0.0–10.0)
Monocytes Absolute: 0.8 10*3/uL (ref 0.00–0.90)
Neutrophils %: 42.7 % — ABNORMAL LOW (ref 50.0–65.0)
Neutrophils Absolute: 3.3 10*3/uL (ref 1.5–7.5)
Platelets: 121 10*3/uL — ABNORMAL LOW (ref 130–400)
RBC: 4.87 M/uL (ref 4.20–5.40)
RDW: 12.3 % (ref 11.5–14.5)
WBC: 7.7 10*3/uL (ref 4.8–10.8)

## 2015-10-21 LAB — URINALYSIS
Bilirubin Urine: NEGATIVE
Blood, Urine: NEGATIVE
Glucose, Ur: NEGATIVE mg/dL
Ketones, Urine: NEGATIVE mg/dL
Leukocyte Esterase, Urine: NEGATIVE
Nitrite, Urine: NEGATIVE
Protein, UA: NEGATIVE mg/dL
Specific Gravity, UA: 1.009 (ref 1.005–1.030)
Urobilinogen, Urine: 0.2 E.U./dL (ref <2.0)
pH, UA: 6 (ref 5.0–8.0)

## 2015-10-21 LAB — URINE DRUG SCREEN
Amphetamine Screen, Urine: NEGATIVE (ref <1000)
Barbiturate Screen, Ur: NEGATIVE (ref <200)
Benzodiazepine Screen, Urine: NEGATIVE (ref <100)
Cannabinoid Scrn, Ur: NEGATIVE (ref <50)
Cocaine Metabolite Screen, Urine: NEGATIVE (ref <300)
Opiate Scrn, Ur: NEGATIVE (ref <300)

## 2015-10-21 LAB — COMPREHENSIVE METABOLIC PANEL
ALT: 80 U/L — ABNORMAL HIGH (ref 5–33)
AST: 36 U/L — ABNORMAL HIGH (ref 5–32)
Albumin: 4.2 g/dL (ref 3.5–5.2)
Alkaline Phosphatase: 100 U/L (ref 35–104)
Anion Gap: 14 mmol/L (ref 7–19)
BUN: 21 mg/dL — ABNORMAL HIGH (ref 6–20)
CO2: 22 mmol/L (ref 22–29)
Calcium: 9.5 mg/dL (ref 8.6–10.0)
Chloride: 101 mmol/L (ref 98–111)
Creatinine: 1.3 mg/dL — ABNORMAL HIGH (ref 0.5–0.9)
GFR Non-African American: 42 — AB (ref >60)
Glucose: 205 mg/dL — ABNORMAL HIGH (ref 74–109)
Potassium: 4.4 mmol/L (ref 3.5–5.0)
Sodium: 137 mmol/L (ref 136–145)
Total Bilirubin: 0.3 mg/dL (ref 0.2–1.2)
Total Protein: 7.8 g/dL (ref 6.6–8.7)

## 2015-10-21 LAB — SALICYLATE LEVEL: Salicylate, Serum: <3 mg/dL (ref 3.0–10.0)

## 2015-10-21 LAB — ACETAMINOPHEN LEVEL: Acetaminophen Level: <15 ug/mL

## 2015-10-21 LAB — ETHANOL: Ethanol Lvl: <10 mg/dL

## 2015-10-21 MED ORDER — ZIPRASIDONE MESYLATE 20 MG IM SOLR
20 MG | Freq: Once | INTRAMUSCULAR | Status: AC
Start: 2015-10-21 — End: 2015-10-20
  Administered 2015-10-21: 03:00:00 20 mg via INTRAMUSCULAR

## 2015-10-21 MED ORDER — LORAZEPAM 2 MG/ML IJ SOLN
2 MG/ML | Freq: Once | INTRAMUSCULAR | Status: AC
Start: 2015-10-21 — End: 2015-10-20
  Administered 2015-10-21: 03:00:00 2 mg via INTRAMUSCULAR

## 2015-10-21 MED FILL — GEODON 20 MG IM SOLR: 20 MG | INTRAMUSCULAR | Qty: 20

## 2015-10-21 MED FILL — LORAZEPAM 2 MG/ML IJ SOLN: 2 MG/ML | INTRAMUSCULAR | Qty: 1

## 2015-10-24 ENCOUNTER — Inpatient Hospital Stay
Admit: 2015-10-24 | Discharge: 2015-10-24 | Disposition: A | Discharge status: Home / Self Care | Payer: BLUE CROSS/BLUE SHIELD | Attending: Emergency Medicine

## 2015-10-24 ENCOUNTER — Emergency Department: Admit: 2015-10-24 | Payer: BLUE CROSS/BLUE SHIELD | Primary: Emergency Medicine

## 2015-10-24 DIAGNOSIS — G4700 Insomnia, unspecified: Secondary | ICD-10-CM

## 2015-10-24 LAB — CBC
Hematocrit: 36.4 % — ABNORMAL LOW (ref 37.0–47.0)
Hemoglobin: 12.7 g/dL (ref 12.0–16.0)
MCH: 30.2 pg (ref 27.0–31.0)
MCHC: 34.9 g/dL (ref 33.0–37.0)
MCV: 86.7 fL (ref 81.0–99.0)
MPV: 11.7 fL (ref 9.4–12.3)
Platelets: 108 10*3/uL — ABNORMAL LOW (ref 130–400)
RBC: 4.2 M/uL (ref 4.20–5.40)
RDW: 12.1 % (ref 11.5–14.5)
WBC: 6.4 10*3/uL (ref 4.8–10.8)

## 2015-10-24 LAB — URINALYSIS
Bilirubin Urine: NEGATIVE
Blood, Urine: NEGATIVE
Glucose, Ur: >=1000 mg/dL — ABNORMAL HIGH
Ketones, Urine: NEGATIVE mg/dL
Nitrite, Urine: NEGATIVE
Protein, UA: 30 mg/dL — AB
Specific Gravity, UA: 1.023 (ref 1.005–1.030)
Urobilinogen, Urine: 0.2 E.U./dL (ref <2.0)
pH, UA: 6 (ref 5.0–8.0)

## 2015-10-24 LAB — COMPREHENSIVE METABOLIC PANEL
ALT: 52 U/L — ABNORMAL HIGH (ref 5–33)
AST: 28 U/L (ref 5–32)
Albumin: 3.7 g/dL (ref 3.5–5.2)
Alkaline Phosphatase: 83 U/L (ref 35–104)
Anion Gap: 13 mmol/L (ref 7–19)
BUN: 12 mg/dL (ref 6–20)
CO2: 21 mmol/L — ABNORMAL LOW (ref 22–29)
Calcium: 9.4 mg/dL (ref 8.6–10.0)
Chloride: 106 mmol/L (ref 98–111)
Creatinine: 0.9 mg/dL (ref 0.5–0.9)
GFR Non-African American: >60 (ref >60)
Glucose: 274 mg/dL — ABNORMAL HIGH (ref 74–109)
Potassium: 4.3 mmol/L (ref 3.5–5.0)
Sodium: 140 mmol/L (ref 136–145)
Total Bilirubin: 0.3 mg/dL (ref 0.2–1.2)
Total Protein: 6.9 g/dL (ref 6.6–8.7)

## 2015-10-24 LAB — MICROSCOPIC URINALYSIS

## 2015-10-24 MED ORDER — ZOLPIDEM TARTRATE 10 MG PO TABS
10 MG | ORAL_TABLET | Freq: Every evening | ORAL | 0 refills | Status: DC | PRN
Start: 2015-10-24 — End: 2016-02-21

## 2015-10-24 MED ORDER — ONDANSETRON HCL 4 MG/2ML IJ SOLN
4 MG/2ML | INTRAMUSCULAR | Status: DC
Start: 2015-10-24 — End: 2015-10-24

## 2015-10-24 MED ORDER — CEPHALEXIN 500 MG PO CAPS
500 MG | ORAL_CAPSULE | Freq: Three times a day (TID) | ORAL | 0 refills | Status: AC
Start: 2015-10-24 — End: 2015-10-31

## 2015-10-24 MED ORDER — MECLIZINE HCL 12.5 MG PO TABS
12.5 MG | Freq: Once | ORAL | Status: AC
Start: 2015-10-24 — End: 2015-10-24
  Administered 2015-10-24: 21:00:00 25 mg via ORAL

## 2015-10-24 MED FILL — ONDANSETRON HCL 4 MG/2ML IJ SOLN: 4 MG/2ML | INTRAMUSCULAR | Qty: 2

## 2015-10-24 MED FILL — MECLIZINE HCL 12.5 MG PO TABS: 12.5 MG | ORAL | Qty: 2

## 2015-10-24 NOTE — Discharge Instructions (Signed)
Insomnia: Care Instructions  Your Care Instructions  Insomnia is the inability to sleep well. It is a common problem for most people at some time. Insomnia may make it hard for you to get to sleep, stay asleep, or sleep as long as you need to. This can make you tired and grouchy during the day. It can also make you forgetful, less effective at work, and unhappy.  Insomnia can be caused by conditions such as depression or anxiety. Pain can also affect your ability to sleep. When these problems are solved, the insomnia usually clears up. But sometimes bad sleep habits can cause insomnia.  If insomnia is affecting your work or your enjoyment of life, you can take steps to improve your sleep.  Follow-up care is a key part of your treatment and safety. Be sure to make and go to all appointments, and call your doctor if you are having problems. It's also a good idea to know your test results and keep a list of the medicines you take.  How can you care for yourself at home?  What to avoid   Do not have drinks with caffeine, such as coffee or black tea, for 8 hours before bed.   Do not smoke or use other types of tobacco near bedtime. Nicotine is a stimulant and can keep you awake.   Avoid drinking alcohol late in the evening, because it can cause you to wake in the middle of the night.   Do not eat a big meal close to bedtime. If you are hungry, eat a light snack.   Do not drink a lot of water close to bedtime, because the need to urinate may wake you up during the night.   Do not read or watch TV in bed. Use the bed only for sleeping and sexual activity.  What to try   Go to bed at the same time every night, and wake up at the same time every morning. Do not take naps during the day.   Keep your bedroom quiet, dark, and cool.   Sleep on a comfortable pillow and mattress.   If watching the clock makes you anxious, turn it facing away from you so you cannot see the time.   If you worry when you lie down,  start a worry book. Well before bedtime, write down your worries, and then set the book and your concerns aside.   Try meditation or other relaxation techniques before you go to bed.   If you cannot fall asleep, get up and go to another room until you feel sleepy. Do something relaxing. Repeat your bedtime routine before you go to bed again.   Make your house quiet and calm about an hour before bedtime. Turn down the lights, turn off the TV, log off the computer, and turn down the volume on music. This can help you relax after a busy day.  When should you call for help?  Watch closely for changes in your health, and be sure to contact your doctor if:   Your efforts to improve your sleep do not work.   Your insomnia gets worse.   You have been feeling down, depressed, or hopeless or have lost interest in things that you usually enjoy.  Where can you learn more?  Go to [email or web address] and sign in to your MyChart account. Enter P513 in the Search Health Information box to learn more about "Insomnia: Care Instructions."     If you do not have   an account, please click on the "Sign Up Now" link.  Current as of: September 13, 2014  Content Version: 11.2   2006-2017 [employer]. Care instructions adapted under license by [facility]. If you have questions about a medical condition or this instruction, always ask your healthcare professional. [employer] disclaims any warranty or liability for your use of this information.       Urinary Tract Infection in Women: Care Instructions  Your Care Instructions    A urinary tract infection, or UTI, is a general term for an infection anywhere between the kidneys and the urethra (where urine comes out). Most UTIs are bladder infections. They often cause pain or burning when you urinate.  UTIs are caused by bacteria and can be cured with antibiotics. Be sure to complete your treatment so that the infection goes away.  Follow-up care  is a key part of your treatment and safety. Be sure to make and go to all appointments, and call your doctor if you are having problems. It's also a good idea to know your test results and keep a list of the medicines you take.  How can you care for yourself at home?   Take your antibiotics as directed. Do not stop taking them just because you feel better. You need to take the full course of antibiotics.   Drink extra water and other fluids for the next day or two. This may help wash out the bacteria that are causing the infection. (If you have kidney, heart, or liver disease and have to limit fluids, talk with your doctor before you increase your fluid intake.)   Avoid drinks that are carbonated or have caffeine. They can irritate the bladder.   Urinate often. Try to empty your bladder each time.   To relieve pain, take a hot bath or lay a heating pad set on low over your lower belly or genital area. Never go to sleep with a heating pad in place.  To prevent UTIs   Drink plenty of water each day. This helps you urinate often, which clears bacteria from your system. (If you have kidney, heart, or liver disease and have to limit fluids, talk with your doctor before you increase your fluid intake.)   Urinate when you need to.   Urinate right after you have sex.   Change sanitary pads often.   Avoid douches, bubble baths, feminine hygiene sprays, and other feminine hygiene products that have deodorants.   After going to the bathroom, wipe from front to back.  When should you call for help?  Call your doctor now or seek immediate medical care if:   Symptoms such as fever, chills, nausea, or vomiting get worse or appear for the first time.   You have new pain in your back just below your rib cage. This is called flank pain.   There is new blood or pus in your urine.   You have any problems with your antibiotic medicine.  Watch closely for changes in your health, and be sure to contact your doctor  if:   You are not getting better after taking an antibiotic for 2 days.   Your symptoms go away but then come back.  Where can you learn more?  Go to [email or web address] and sign in to your MyChart account. Enter [phone] in the Search Health Information box to learn more about "Urinary Tract Infection in Women: Care Instructions."     If you do not have an account, please  click on the "Sign Up Now" link.  Current as of: January 16, 2015  Content Version: 11.2   2006-2017 [employer]. Care instructions adapted under license by [facility]. If you have questions about a medical condition or this instruction, always ask your healthcare professional. [employer] disclaims any warranty or liability for your use of this information.

## 2015-10-24 NOTE — ED Notes (Signed)
Bed: 04  Expected date:   Expected time:   Means of arrival:   Comments:  EMS     Cristopher Peru, RN  10/24/15 [phone]

## 2015-10-24 NOTE — ED Notes (Signed)
Patient was seen this weekend for psych issues and believes her bipolar meds are what is making her feel bad     Joyce PeruJessica Christinamarie Tall, RN  10/24/15 [phone]

## 2015-10-24 NOTE — ED Provider Notes (Signed)
[facility] EMERGENCY DEPT  eMERGENCY dEPARTMENT eNCOUnter      Pt Name: Joyce Cohen  MRN: [id number]  Birthdate June 02, 1955  Date of evaluation: 10/24/2015  Provider: Deretha Emoryhristopher Kodiak Rollyson, MD    CHIEF COMPLAINT       Chief Complaint   Patient presents with   ??? Abdominal Pain   ??? Hyperglycemia   ??? Dizziness         HISTORY OF PRESENT ILLNESS   (Location/Symptom, Timing/Onset, Context/Setting, Quality, Duration, Modifying Factors, Severity)  Note limiting factors.   Joyce Cohen is a 60 y.o. female who presents to the emergency department      Patient is a 60 y.o. female presenting with hyperglycemia. The history is provided by the patient.   Hyperglycemia   Blood sugar level PTA:  400s, "then my sliding scale overshoots to 70"  Duration:  3 days  Timing:  Intermittent  Chronicity:  Recurrent  Diabetes status:  Controlled with insulin and controlled with oral medications  Context: change in medication (bipolar meds, pt wonders if that is causing fluctuations)    Associated symptoms: dizziness, malaise and nausea    Associated symptoms: no abdominal pain (pt denies to me), no chest pain, no dehydration, no diaphoresis, no fever, no shortness of breath and no vomiting    Associated symptoms comment:  "just don't feel right"      Nursing Notes were reviewed.    REVIEW OF SYSTEMS    (2-9 systems for level 4, 10 or more for level 5)     Review of Systems   Constitutional: Negative for diaphoresis and fever.   Respiratory: Negative for shortness of breath.    Cardiovascular: Negative for chest pain.   Gastrointestinal: Positive for nausea. Negative for abdominal pain (pt denies to me) and vomiting.   Neurological: Positive for dizziness.   All other systems reviewed and are negative.      Except as noted above the remainder of the review of systems was reviewed and negative.       PAST MEDICAL HISTORY     Past Medical History:   Diagnosis Date   ??? Abdominal pain, right lower quadrant    ??? 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 Arthritis    ??? Bipolar disorder (HCC)    ??? Blood circulation, collateral    ??? Cancer (HCC)     skin cancer   ??? Diabetes mellitus (HCC)    ??? Hearing aid worn     bilateral   ??? Hernia    ??? Hyperlipidemia    ??? MVP (mitral valve prolapse)    ??? Neuromuscular disorder (HCC) Inguinal Nerve pain   ??? Pleurisy without mention of effusion or current tuberculosis    ??? Right groin pain 02/28/2014   ??? Stomach ulcer     as a child   ??? Torn rotator cuff     right shoulder   ??? Unspecified asthma    ??? Unspecified constipation    ??? Unspecified hemorrhoids without mention of complication          SURGICAL HISTORY       Past Surgical History:   Procedure Laterality Date   ??? ABDOMEN SURGERY     ??? ABDOMINAL EXPLORATION SURGERY      times two due to adhesions   ??? APPENDECTOMY     ??? CHOLECYSTECTOMY, LAPAROSCOPIC N/A 02/24/2015    CHOLECYSTECTOMY LAPAROSCOPIC [facility] J [facility]   ???  CHOLECYSTECTOMY, LAPAROSCOPIC     ??? COLON SURGERY  04/2013    1 polyp removed   ??? COLONOSCOPY  05/19/11    Dr Renato Gails   ??? COLONOSCOPY  07/15/2012    Dr. Zollie Beckers   ??? HYSTERECTOMY     ??? ROTATOR CUFF REPAIR Right    ??? SKIN BIOPSY  L and R wrists   ??? TONSILLECTOMY     ??? TUBAL LIGATION     ??? WISDOM TOOTH EXTRACTION           CURRENT MEDICATIONS       Discharge Medication List as of 10/24/2015  6:26 PM      CONTINUE these medications which have NOT CHANGED    Details   traMADol (ULTRAM) 50 MG tablet Take 50 mg by mouth every 6 hours as needed for PainHistorical Med      QUEtiapine (SEROQUEL XR) 400 MG extended release tablet Take 400 mg by mouth nightlyHistorical Med      Ospemifene 60 MG TABS Take 1 tablet by mouth daily, Disp-90 tablet, R-1Normal      acetaminophen (TYLENOL) 325 MG tablet Take 2 tablets by mouth every 4 hours as needed for Pain, Disp-120 tablet, R-3Print      albuterol sulfate HFA (VENTOLIN HFA) 108 (90 Base) MCG/ACT inhaler Inhale 2 puffs into the lungs every 4 hours as needed for Wheezing, Disp-1 Inhaler,  R-3Print      OXcarbazepine (TRILEPTAL) 150 MG tablet Take 1 tablet by mouth 2 times daily, Disp-60 tablet, R-3Print      melatonin 3 MG TABS tablet Take 3 tablets by mouth nightly as needed (sleep), Disp-90 tablet, R-3Print      famotidine (PEPCID) 20 MG tablet Take 1 tablet by mouth 2 times daily, Disp-60 tablet, R-3Print      HUMALOG 100 UNIT/ML injection vial DAWHistorical Med      Lancets MISC Starting 08/31/2015, Until Discontinued, Historical Med      NONFORMULARY Arthritis hand ointmentHistorical Med      carbidopa-levodopa (SINEMET) 10-100 MG per tablet Take 1 tablet by mouth nightly Indications: 1 to 2 tablets at bedtime Historical Med      montelukast (SINGULAIR) 10 MG tablet Take 10 mg by mouth nightly      vitamin D (ERGOCALCIFEROL) 50000 UNITS CAPS capsule Take 50,000 Units by mouth once a week Historical Med      traZODone (DESYREL) 100 MG tablet Take 1 tablet by mouth nightly as needed for Sleep, Disp-30 tablet, R-0      buPROPion SR (WELLBUTRIN SR) 150 MG SR tablet Take 1 tablet by mouth daily, Disp-60 tablet, R-3      lubiprostone (AMITIZA) 24 MCG capsule Take 1 capsule by mouth 2 times daily (with meals), Disp-60 capsule, R-11      glipiZIDE (GLUCOTROL) 10 MG tablet Take 1 tablet by mouth every morning (before breakfast), Disp-60 tablet, R-3             ALLERGIES     Tobacco [nicotiana tabacum]; Decongestant [pseudoephedrine hcl]; Motrin [ibuprofen]; and Cabbage    FAMILY HISTORY       Family History   Problem Relation Age of Onset   ??? Alcohol Abuse Father    ??? Mental Illness Father    ??? Alcohol Abuse Mother    ??? Mental Illness Mother    ??? Hypertension Mother    ??? Alcohol Abuse Brother    ??? Cancer Paternal Grandfather    ??? Mental Illness Maternal Grandmother    ??? Hypertension  Maternal Grandmother    ??? Mental Illness Daughter    ??? Colon Cancer Neg Hx    ??? Colon Polyps Neg Hx    ??? Liver Cancer Neg Hx    ??? Stomach Cancer Neg Hx    ??? Ulcerative Colitis Neg Hx    ??? Crohn's Disease Neg Hx    ??? Liver  Disease Neg Hx    ??? Rectal Cancer Neg Hx           SOCIAL HISTORY       Social History     Social History   ??? Marital status: Married     Spouse name: Johnnie   ??? Number of children: 2   ??? Years of education: 56     Social History Main Topics   ??? Smoking status: Never Smoker   ??? Smokeless tobacco: Never Used      Comment: Disabled due bipolar/autism   ??? Alcohol use No   ??? Drug use: No   ??? Sexual activity: Not Asked     Other Topics Concern   ??? None     Social History Narrative       SCREENINGS    Glasgow Coma Scale  Eye Opening: Spontaneous  Best Verbal Response: Oriented  Best Motor Response: Obeys commands  Glasgow Coma Scale Score: 15        PHYSICAL EXAM    (up to 7 for level 4, 8 or more for level 5)   ED Triage Vitals   BP Temp Temp src Pulse Resp SpO2 Height Weight   10/24/15 1415 10/24/15 1415 -- 10/24/15 1415 10/24/15 1415 10/24/15 1415 10/24/15 1415 10/24/15 1415   131/81 97.3 ??F (36.3 ??C)  83 16 91 % 5\' 5"  (1.651 m) 213 lb (96.6 kg)       Physical Exam   Constitutional: She is oriented to person, place, and time. She appears well-developed and well-nourished. No distress.   HENT:   Mouth/Throat: Oropharynx is clear and moist.   Neck: Normal range of motion. Neck supple.   Cardiovascular: Normal rate and normal heart sounds.    Pulmonary/Chest: Effort normal and breath sounds normal.   Abdominal: Soft. There is no tenderness.   Musculoskeletal: She exhibits no edema.   Neurological: She is alert and oriented to person, place, and time.   Skin: Skin is warm and dry. She is not diaphoretic.   Psychiatric:   Flat affect   Nursing note and vitals reviewed.      DIAGNOSTIC RESULTS     EKG: All EKG's are interpreted by the Emergency Department Physician who either signs or Co-signs this chart in the absence of a cardiologist.        RADIOLOGY:   Non-plain film images such as CT, Ultrasound and MRI are read by the radiologist. Plain radiographic images are visualized and preliminarily interpreted by the emergency  physician with the below findings:        Interpretation per the Radiologist below, if available at the time of this note:    XR Chest Portable   Final Result   Impression: Shallow inspiration, mild cardiomegaly, no acute findings.   Signed by Dr Quita Skye. Vanhoose on 10/24/2015 4:12 PM            ED BEDSIDE ULTRASOUND:   Performed by ED Physician - none    LABS:  Labs Reviewed   CBC - Abnormal; Notable for the following:        Result Value  Hematocrit 36.4 (*)     Platelets 108 (*)     All other components within normal limits   COMPREHENSIVE METABOLIC PANEL - Abnormal; Notable for the following:     CO2 21 (*)     Glucose 274 (*)     ALT 52 (*)     All other components within normal limits   URINALYSIS - Abnormal; Notable for the following:     Clarity, UA CLOUDY (*)     Glucose, Ur >=1000 (*)     Protein, UA 30 (*)     Leukocyte Esterase, Urine SMALL (*)     All other components within normal limits   MICROSCOPIC URINALYSIS - Abnormal; Notable for the following:     WBC, UA 3-5 (*)     Bacteria, UA 1+ (*)     Yeast, UA Rare (*)     All other components within normal limits   URINE CULTURE       All other labs were within normal range or not returned as of this dictation.    EMERGENCY DEPARTMENT COURSE and DIFFERENTIAL DIAGNOSIS/MDM:   Vitals:    Vitals:    10/24/15 1702 10/24/15 1733 10/24/15 1802 10/24/15 1832   BP: 130/71 132/82 (!) 130/91 (!) 130/55   Pulse: 82 83 87 79   Resp:       Temp:       SpO2: 93% 92% 93% 94%   Weight:       Height:               MDM  Number of Diagnoses or Management Options  Diagnosis management comments: Complaints were all over the place and changed with every time personnel entered the room.  She eventually wanted psych admission for insomnia.  Discussed with Dr. Daphane ShepherdMeyer - OK to give Ambien with her current meds, follow up as outpt.      CRITICAL CARE TIME   Total Critical Care time was 0 minutes, excluding separately reportable procedures.  There was a high probability of  clinically significant/life threatening deterioration in the patient's condition which required my urgent intervention.      CONSULTS:  None    PROCEDURES:  Unless otherwise noted below, none     Procedures    FINAL IMPRESSION      1. Insomnia, unspecified type    2. UTI (urinary tract infection), bacterial    3. Malaise          DISPOSITION/PLAN   DISPOSITION     PATIENT REFERRED TO:  Psychiatrist asap            DISCHARGE MEDICATIONS:  Discharge Medication List as of 10/24/2015  6:26 PM      START taking these medications    Details   zolpidem (AMBIEN) 10 MG tablet Take 1 tablet by mouth nightly as needed for Sleep, Disp-10 tablet, R-0Print      cephALEXin (KEFLEX) 500 MG capsule Take 1 capsule by mouth 3 times daily for 7 days, Disp-21 capsule, R-0Print                (Please note that portions of this note were completed with a voice recognition program.  Efforts were made to edit the dictations but occasionally words are mis-transcribed.)    Deretha Emoryhristopher Mccayla Shimada, MD (electronically signed)  Attending Emergency Physician       Deretha Emoryhristopher Pietrina Jagodzinski, MD  10/24/15 [phone]

## 2015-10-24 NOTE — ED Notes (Signed)
Portable xray at bedside     Edwyna Shell, RN  10/24/15 [phone]

## 2015-10-26 LAB — CULTURE, URINE: Urine Culture, Routine: <50000

## 2015-11-02 ENCOUNTER — Emergency Department: Admit: 2015-11-03 | Payer: BLUE CROSS/BLUE SHIELD | Primary: Emergency Medicine

## 2015-11-02 DIAGNOSIS — G44209 Tension-type headache, unspecified, not intractable: Principal | ICD-10-CM

## 2015-11-02 NOTE — ED Provider Notes (Signed)
[facility] EMERGENCY DEPT  eMERGENCY dEPARTMENT eNCOUnter      Pt Name: Joyce Cohen  MRN: [id number]  Birthdate [birth year]  Date of evaluation: 11/02/2015  Provider: Myles LippsGregLyanne Coory L Keosha Rossa, MD    CHIEF COMPLAINT       Chief Complaint   Patient presents with   . Emesis   . Dehydration         HISTORY OF PRESENT ILLNESS   (Location/Symptom, Timing/Onset, Context/Setting, Quality, Duration, Modifying Factors, Severity)  Note limiting factors.   Joyce Cohen is a 60 y.o. female who presents to the emergency department     HPI  patient has several days of headache associated with nausea and vomiting. Seen by PCP and office today and told she was possibly dehydrated. Patient was given oral medicine for nausea with mild improvement. Patient is also had 2-3 days of increasing headache. Denies fever chest pain diarrhea weakness or palpitations.  Nursing Notes were reviewed.    REVIEW OF SYSTEMS    (2-9 systems for level 4, 10 or more for level 5)     Review of Systems   Constitutional: Negative.    Eyes: Negative.    Respiratory: Negative.    Gastrointestinal: Positive for nausea and vomiting. Negative for abdominal distention, abdominal pain, constipation and diarrhea.   Endocrine: Negative.    Genitourinary: Negative.    Musculoskeletal: Negative.    Skin: Negative.    Neurological: Positive for headaches. Negative for dizziness, tremors, seizures, syncope, facial asymmetry, speech difficulty, weakness, light-headedness and numbness.   Hematological: Negative.    All other systems reviewed and are negative.           PAST MEDICAL HISTORY     Past Medical History:   Diagnosis Date   . Abdominal pain, right lower quadrant    . 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 Arthritis    . Bipolar disorder (HCC)    . Blood circulation, collateral    . Cancer (HCC)     skin cancer   . Diabetes mellitus (HCC)    . Hearing aid worn     bilateral   . Hernia    . Hyperlipidemia    . MVP (mitral valve prolapse)    .  Neuromuscular disorder (HCC) Inguinal Nerve pain   . Pleurisy without mention of effusion or current tuberculosis    . Right groin pain 02/28/2014   . Stomach ulcer     as a child   . Torn rotator cuff     right shoulder   . Unspecified asthma    . Unspecified constipation    . Unspecified hemorrhoids without mention of complication          SURGICAL HISTORY       Past Surgical History:   Procedure Laterality Date   . ABDOMEN SURGERY     . ABDOMINAL EXPLORATION SURGERY      times two due to adhesions   . APPENDECTOMY     . CHOLECYSTECTOMY, LAPAROSCOPIC N/A 02/24/2015    CHOLECYSTECTOMY [facility]COP[facility]sey J [facility]   . CHOLECYSTECTOMY, LAPAROSCOPIC     . COLON SURGERY  04/2013    1 polyp removed   . COLONOSCOPY  05/19/11    Dr Renato Gailseed   . COLONOSCOPY  07/15/2012    Dr. Zollie BeckersHendon   . HYSTERECTOMY     . ROTATOR CUFF REPAIR Right    . SKIN BIOPSY  L  and R wrists   . TONSILLECTOMY     . TUBAL LIGATION     . WISDOM TOOTH EXTRACTION           CURRENT MEDICATIONS       Previous Medications    ACETAMINOPHEN (TYLENOL) 325 MG TABLET    Take 2 tablets by mouth every 4 hours as needed for Pain    ALBUTEROL SULFATE HFA (VENTOLIN HFA) 108 (90 BASE) MCG/ACT INHALER    Inhale 2 puffs into the lungs every 4 hours as needed for Wheezing    BUPROPION SR (WELLBUTRIN SR) 150 MG SR TABLET    Take 1 tablet by mouth daily    CARBIDOPA-LEVODOPA (SINEMET) 10-100 MG PER TABLET    Take 1 tablet by mouth nightly Indications: 1 to 2 tablets at bedtime     FAMOTIDINE (PEPCID) 20 MG TABLET    Take 1 tablet by mouth 2 times daily    GLIPIZIDE (GLUCOTROL) 10 MG TABLET    Take 1 tablet by mouth every morning (before breakfast)    HUMALOG 100 UNIT/ML INJECTION VIAL        INSULIN GLARGINE (LANTUS) 100 UNIT/ML INJECTION VIAL    Inject 10 Units into the skin nightly    LANCETS MISC        LUBIPROSTONE (AMITIZA) 24 MCG CAPSULE    Take 1 capsule by mouth 2 times daily (with meals)    MELATONIN 3 MG TABS TABLET    Take 3 tablets by mouth  nightly as needed (sleep)    MONTELUKAST (SINGULAIR) 10 MG TABLET    Take 10 mg by mouth nightly    NONFORMULARY    Arthritis hand ointment    ONDANSETRON (ZOFRAN-ODT) 8 MG DISINTEGRATING TABLET    Take 8 mg by mouth every 8 hours as needed for Nausea or Vomiting    OSPEMIFENE 60 MG TABS    Take 1 tablet by mouth daily    OXCARBAZEPINE (TRILEPTAL) 150 MG TABLET    Take 1 tablet by mouth 2 times daily    QUETIAPINE (SEROQUEL XR) 400 MG EXTENDED RELEASE TABLET    Take 400 mg by mouth nightly    TRAMADOL (ULTRAM) 50 MG TABLET    Take 50 mg by mouth every 6 hours as needed for Pain    TRAZODONE (DESYREL) 100 MG TABLET    Take 1 tablet by mouth nightly as needed for Sleep    VITAMIN D (ERGOCALCIFEROL) 50000 UNITS CAPS CAPSULE    Take 50,000 Units by mouth once a week     ZOLPIDEM (AMBIEN) 10 MG TABLET    Take 1 tablet by mouth nightly as needed for Sleep       ALLERGIES     Tobacco [nicotiana tabacum]; Decongestant [pseudoephedrine hcl]; Motrin [ibuprofen]; and Cabbage    FAMILY HISTORY       Family History   Problem Relation Age of Onset   . Alcohol Abuse Father    . Mental Illness Father    . Alcohol Abuse Mother    . Mental Illness Mother    . Hypertension Mother    . Alcohol Abuse Brother    . Cancer Paternal Grandfather    . Mental Illness Maternal Grandmother    . Hypertension Maternal Grandmother    . Mental Illness Daughter    . Colon Cancer Neg Hx    . Colon Polyps Neg Hx    . Liver Cancer Neg Hx    . Stomach Cancer Neg Hx    .  Ulcerative Colitis Neg Hx    . Crohn's Disease Neg Hx    . Liver Disease Neg Hx    . Rectal Cancer Neg Hx           SOCIAL HISTORY       Social History     Social History   . Marital status: Married     Spouse name: Johnnie   . Number of children: 2   . Years of education: 6     Social History Main Topics   . Smoking status: Never Smoker   . Smokeless tobacco: Never Used      Comment: Disabled due bipolar/autism   . Alcohol use No   . Drug use: No   . Sexual activity: Not Asked     Other  Topics Concern   . None     Social History Narrative       SCREENINGS             PHYSICAL EXAM    (up to 7 for level 4, 8 or more for level 5)   ED Triage Vitals   BP Temp Temp Source Pulse Resp SpO2 Height Weight   11/02/15 2200 11/02/15 2200 11/02/15 2200 11/02/15 2200 11/02/15 2200 11/02/15 2200 11/02/15 2200 11/02/15 2200   137/95 98.4 F (36.9 C) Oral 90 18 94 % 5\' 5"  (1.651 m) 213 lb (96.6 kg)       Physical Exam   Constitutional: She is oriented to person, place, and time. She appears well-developed and well-nourished. No distress.   HENT:   Head: Normocephalic.   Right Ear: External ear normal.   Left Ear: External ear normal.   Eyes: Conjunctivae are normal. Pupils are equal, round, and reactive to light. Right eye exhibits no discharge. Left eye exhibits no discharge. Scleral icterus is present.   Neck: Normal range of motion. Neck supple. No JVD present. No tracheal deviation present. No thyromegaly present.   Cardiovascular: Normal rate, regular rhythm, normal heart sounds and intact distal pulses.  Exam reveals no gallop and no friction rub.    No murmur heard.  Pulmonary/Chest: Effort normal and breath sounds normal. No stridor. No respiratory distress. She has no wheezes. She has no rales. She exhibits no tenderness.   Abdominal: Soft. Bowel sounds are normal. She exhibits no distension and no mass. There is no tenderness. There is no rebound and no guarding.   Musculoskeletal: Normal range of motion. She exhibits no edema, tenderness or deformity.   Lymphadenopathy:     She has no cervical adenopathy.   Neurological: She is alert and oriented to person, place, and time. She displays normal reflexes. No cranial nerve deficit. She exhibits normal muscle tone. Coordination normal.   Skin: Skin is warm and dry. No rash noted. She is not diaphoretic. No erythema. No pallor.   Psychiatric: She has a normal mood and affect. Her behavior is normal.   Nursing note and vitals reviewed.      DIAGNOSTIC  RESULTS     EKG: All EKG's are interpreted by the Emergency Department Physician who either signs or Co-signs this chart in the absence of a cardiologist.        RADIOLOGY:   Non-plain film images such as CT, Ultrasound and MRI are read by the radiologist. Plain radiographic images are visualized and preliminarily interpreted by the emergency physician with the below findings:    CT the head read as no acute disease      CT  Head WO Contrast    (Results Pending)           LABS:  Labs Reviewed   COMPREHENSIVE METABOLIC PANEL - Abnormal; Notable for the following:        Result Value    Glucose 192 (*)     CREATININE 1.0 (*)     GFR Non-African American 57 (*)     ALT 49 (*)     All other components within normal limits   LIPASE - Abnormal; Notable for the following:     Lipase 88 (*)     All other components within normal limits   CBC   AMYLASE   URINALYSIS       All other labs were within normal range or not returned as of this dictation.    EMERGENCY DEPARTMENT COURSE and DIFFERENTIAL DIAGNOSIS/MDM:   Vitals:    Vitals:    11/02/15 2200 11/02/15 2329 11/02/15 2351   BP: (!) 137/95 121/69 120/70   Pulse: 90 64 70   Resp: 18 16 16    Temp: 98.4 F (36.9 C)     TempSrc: Oral     SpO2: 94% 93% 95%   Weight: 213 lb (96.6 kg)     Height: 5\' 5"  (1.651 m)         MDM    Reassessment  Patient states much better after treatment    CONSULTS:  None    PROCEDURES:  Unless otherwise noted below, none     Procedures    FINAL IMPRESSION      1. Acute non intractable tension-type headache    2. Non-intractable vomiting without nausea, unspecified vomiting type          DISPOSITION/PLAN   DISPOSITION     PATIENT REFERRED TO:  Sharmon Leyden, MD  [address]  [place] [address]  [phone]    In 1 day        DISCHARGE MEDICATIONS:  New Prescriptions    No medications on file          (Please note that portions of this note were completed with a voice recognition program.  Efforts were made to edit the dictations but  occasionally words are mis-transcribed.)    Myles Lipps, MD (electronically signed)  Attending Emergency Physician         Myles Lipps, MD  11/03/15 [phone]

## 2015-11-03 ENCOUNTER — Inpatient Hospital Stay
Admit: 2015-11-03 | Discharge: 2015-11-03 | Disposition: A | Discharge status: Home / Self Care | Payer: BLUE CROSS/BLUE SHIELD | Attending: Emergency Medicine

## 2015-11-03 LAB — CBC
Hematocrit: 42.7 % (ref 37.0–47.0)
Hemoglobin: 14.4 g/dL (ref 12.0–16.0)
MCH: 29.4 pg (ref 27.0–31.0)
MCHC: 33.7 g/dL (ref 33.0–37.0)
MCV: 87.3 fL (ref 81.0–99.0)
MPV: 10.8 fL (ref 9.4–12.3)
Platelets: 209 10*3/uL (ref 130–400)
RBC: 4.89 M/uL (ref 4.20–5.40)
RDW: 12.1 % (ref 11.5–14.5)
WBC: 9.8 10*3/uL (ref 4.8–10.8)

## 2015-11-03 LAB — COMPREHENSIVE METABOLIC PANEL
ALT: 49 U/L — ABNORMAL HIGH (ref 5–33)
AST: 27 U/L (ref 5–32)
Albumin: 4 g/dL (ref 3.5–5.2)
Alkaline Phosphatase: 85 U/L (ref 35–104)
Anion Gap: 14 mmol/L (ref 7–19)
BUN: 15 mg/dL (ref 6–20)
CO2: 23 mmol/L (ref 22–29)
Calcium: 9.4 mg/dL (ref 8.6–10.0)
Chloride: 100 mmol/L (ref 98–111)
Creatinine: 1 mg/dL — ABNORMAL HIGH (ref 0.5–0.9)
GFR Non-African American: 57 — AB (ref >60)
Glucose: 192 mg/dL — ABNORMAL HIGH (ref 74–109)
Potassium: 4.1 mmol/L (ref 3.5–5.0)
Sodium: 137 mmol/L (ref 136–145)
Total Bilirubin: 0.3 mg/dL (ref 0.2–1.2)
Total Protein: 7 g/dL (ref 6.6–8.7)

## 2015-11-03 LAB — MICROSCOPIC URINALYSIS
Bacteria, UA: NEGATIVE /HPF
Epithelial Cells, UA: 10 /HPF (ref 0–5)
Hyaline Casts, UA: 2 /HPF (ref 0–8)
RBC, UA: 1 /HPF (ref 0–4)
WBC, UA: 5 /HPF (ref 0–5)

## 2015-11-03 LAB — URINALYSIS
Bilirubin Urine: NEGATIVE
Blood, Urine: NEGATIVE
Glucose, Ur: NEGATIVE mg/dL
Ketones, Urine: NEGATIVE mg/dL
Nitrite, Urine: NEGATIVE
Protein, UA: NEGATIVE mg/dL
Specific Gravity, UA: 1.01 (ref 1.005–1.030)
Urobilinogen, Urine: 0.2 E.U./dL (ref <2.0)
pH, UA: 6 (ref 5.0–8.0)

## 2015-11-03 LAB — AMYLASE: Amylase: 89 U/L (ref 28–100)

## 2015-11-03 LAB — LIPASE: Lipase: 88 U/L — ABNORMAL HIGH (ref 13–60)

## 2015-11-03 MED ORDER — SODIUM CHLORIDE 0.9 % IV SOLN
0.9 % | INTRAVENOUS | Status: DC
Start: 2015-11-03 — End: 2015-11-03
  Administered 2015-11-03: 03:00:00 via INTRAVENOUS

## 2015-11-03 MED ORDER — MEPERIDINE HCL 25 MG/ML IJ SOLN
25 MG/ML | Freq: Once | INTRAMUSCULAR | Status: AC
Start: 2015-11-03 — End: 2015-11-02
  Administered 2015-11-03: 03:00:00 25 mg via INTRAVENOUS

## 2015-11-03 MED ORDER — SODIUM CHLORIDE 0.9 % IV BOLUS
0.9 % | Freq: Once | INTRAVENOUS | Status: AC
Start: 2015-11-03 — End: 2015-11-03
  Administered 2015-11-03: 04:00:00 1000 mL via INTRAVENOUS

## 2015-11-03 MED ORDER — ONDANSETRON 4 MG PO TBDP
4 MG | ORAL_TABLET | Freq: Three times a day (TID) | ORAL | 0 refills | Status: DC | PRN
Start: 2015-11-03 — End: 2016-01-05

## 2015-11-03 MED ORDER — ONDANSETRON HCL 4 MG/2ML IJ SOLN
4 MG/2ML | Freq: Once | INTRAMUSCULAR | Status: AC
Start: 2015-11-03 — End: 2015-11-02
  Administered 2015-11-03: 03:00:00 4 mg via INTRAVENOUS

## 2015-11-03 MED FILL — ONDANSETRON HCL 4 MG/2ML IJ SOLN: 4 MG/2ML | INTRAMUSCULAR | Qty: 2

## 2015-11-03 MED FILL — DEMEROL 25 MG/ML IJ SOLN: 25 MG/ML | INTRAMUSCULAR | Qty: 1

## 2015-11-03 NOTE — ED Notes (Signed)
Pt ambulated to bathroom; c/o dizziness     Marlene Lard, RN  11/03/15 [phone]

## 2015-11-05 LAB — CULTURE, URINE: Urine Culture, Routine: >50000

## 2015-11-20 ENCOUNTER — Encounter

## 2015-11-20 NOTE — Progress Notes (Signed)
Nursing Admission Record    Current Issues / Falls / ER Visits:  Yes Hospital admission in August 2017 at [facility] and ED visit at [facility] on 10/20/15 for dehydration.    Percentage of Pain Relief after Last Procedure:  92 %    How long lasted:  2 months    Radiology exams received during the last 12 months: Yes Lumbar MRI       When July 2017                                              Where [facility]       Imaging on chart: Yes         Imaging records requested: No  MRI exams received in the past 2 years:  Yes Lumbar MRI in July 2017 at [facility]  Physical therapy during the last 6 months: No       When: na                                             Where  na  Labs during the last 12 months: Yes    Education Provided:  [x]  Review of [facility]  []  Agreement Review  []  Compliance Issues Discussed    []  Cognitive Behavior Needs [x]  Exercise []  Review of Test []  Financial Issues  [x]  Tobacco/Alcohol Use [x]  Teaching []  New Patient []  Picture Obtained    Physician Plan:  []  Outgoing Referral  []  Pharmacy Consult  []  Test Ordered   []  Obtained Test Results / Consult Notes  []  UDS due at next visit, verified per EPIC      []  Suspected Physical Abuse or Suicide Risk assessed - IF YES COMPLETE QUESTIONS BELOW    If any of the following questions are answered yes - contact attending physician for referral:    Has been considering harming self to escape stress, pain problems?  []  YES  []  NO  Has a suicide plan? []  YES  []  NO  Has attempted suicide in the past?   []  YES  []  NO  Has a close friend or family member who committed suicide?  []  YES  []  NO    Patient Referred To :     Additional Notes:    Assessment Completed by:  Electronically signed by Erroll Luna, RN on 11/20/2015 at 11:16 AM

## 2015-11-20 NOTE — Progress Notes (Signed)
[facility]  Patient Pain Assessment  Progress Note       Chief Complaint   Patient presents with   ??? Lower Back Pain     radiates down tailbone   ??? Abdominal Pain     right lower groin     Pain Assessment:  Pain Assessment: 0-10  Pain Level: 5  Pain Type: Chronic pain  Pain Location: Back  Pain Orientation: Lower  Pain Radiating Towards: radiates down tailbone   Pain Descriptors: Aching, Constant  Pain Frequency: Continuous  Pain Onset: On-going  Clinical Progression: Not changed  Effect of Pain on Daily Activities: limtis activiites       Pain medication assessment:      []   Pain control issues, comment if applicable:     [x]   Reports current medication is helping, but continues to have on-going pain   [x]   Reports current pain medication increases ability to do activities of daily living     [x]   Discussed possible medication side effects, risk of tolerance and/or dependence, alternative treatments    [x]   Encouraged to set goals of decreasing daily narcotic intake    [x]   Discussed effects of long term narcotic use      [x]   Injection options discussed and repeat injection scheduled for next visit     [] Yes [x] No  Current medication side effects, comment, if applicable:      [] Yes [x] No   Acute bladder or bowel changes    Issues of Concern / Previous Procedure / Percentage of pain control / Imaging / PT History:  Current Issues / Falls / ER Visits: yes,  Reports hospital admission in August 2017 at [facility] and ED visit at [facility] on 10/20/15 for dehydration, no prescriptions noted on KASPER from other providers  ??  Percentage of Pain Relief after Last Procedure: 92 %  How long lasted: 2 months  ??  Radiology exams received during the last 12 months: Lumbar MRI  When July 2017                                              Where [facility]  Imaging on chart: Yes    MRI exams received in the past 2 years: Lumbar MRI in July 2017 at [facility]  Physical therapy during the last 6 months: No    BMI: Body mass  index is 34.95 kg/(m^2).   [x]   Nutrient rich, low fat, low carbohydrate diet discussed   [x]   Positive effect of weight management on pain control discussed    Activity:   [x]  Exercise discussed as beneficial to pain reduction, encouraged stretching exercises and to set daily goals    Tobacco use:    [x]  Never        Social History     Social History   ??? Marital status: Married     Spouse name: Johnnie   ??? Number of children: 2   ??? Years of education: 67     Social History Main Topics   ??? Smoking status: Never Smoker   ??? Smokeless tobacco: Never Used      Comment: Disabled due bipolar/autism   ??? Alcohol use No   ??? Drug use: No   ??? Sexual activity: Not on file     Other Topics Concern   ??? Not on file     Social  History Narrative                                                              Past Medical History:       Diagnosis Date   ??? Abdominal pain, right lower quadrant    ??? 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 Arthritis    ??? Bipolar disorder (HCC)    ??? Blood circulation, collateral    ??? Cancer (HCC)     skin cancer   ??? Diabetes mellitus (HCC)    ??? Hearing aid worn     bilateral   ??? Hernia    ??? Hyperlipidemia    ??? MVP (mitral valve prolapse)    ??? Neuromuscular disorder (HCC) Inguinal Nerve pain   ??? Pleurisy without mention of effusion or current tuberculosis    ??? Right groin pain 02/28/2014   ??? Stomach ulcer     as a child   ??? Torn rotator cuff     right shoulder   ??? Unspecified asthma    ??? Unspecified constipation    ??? Unspecified hemorrhoids without mention of complication        Surgical History:  Past Surgical History:   Procedure Laterality Date   ??? ABDOMEN SURGERY     ??? ABDOMINAL EXPLORATION SURGERY      times two due to adhesions   ??? APPENDECTOMY     ??? CHOLECYSTECTOMY, LAPAROSCOPIC N/A 02/24/2015    CHOLECYSTECTOMY LAPAROSCOPIC performed by Gabriel Rainwater, MD at [facility] OR   ??? CHOLECYSTECTOMY, LAPAROSCOPIC     ??? COLON SURGERY  04/2013    1 polyp removed   ??? COLONOSCOPY  05/19/11    Dr Renato Gails   ???  COLONOSCOPY  07/15/2012    Dr. Zollie Beckers   ??? HYSTERECTOMY     ??? ROTATOR CUFF REPAIR Right    ??? SKIN BIOPSY  L and R wrists   ??? TONSILLECTOMY     ??? TUBAL LIGATION     ??? WISDOM TOOTH EXTRACTION         Family History:  family history includes Alcohol Abuse in her brother, father, and mother; Cancer in her paternal grandfather; Hypertension in her maternal grandmother and mother; Mental Illness in her daughter, father, maternal grandmother, and mother. There is no history of Colon Cancer, Colon Polyps, Liver Cancer, Stomach Cancer, Ulcerative Colitis, Crohn's Disease, Liver Disease, or Rectal Cancer.     Allergies:  Tobacco [nicotiana tabacum]; Decongestant [pseudoephedrine hcl]; Motrin [ibuprofen]; and Cabbage     Medications:  Current Outpatient Prescriptions   Medication Sig Dispense Refill   ??? traMADol (ULTRAM) 50 MG tablet Take 50 mg by mouth 2 times daily as needed for Pain     ??? ondansetron (ZOFRAN ODT) 4 MG disintegrating tablet Take 1 tablet by mouth every 8 hours as needed for Nausea or Vomiting 10 tablet 0   ??? insulin glargine (LANTUS) 100 UNIT/ML injection vial Inject 10 Units into the skin nightly     ??? QUEtiapine (SEROQUEL XR) 400 MG extended release tablet Take 400 mg by mouth nightly     ??? zolpidem (AMBIEN) 10 MG tablet Take 1 tablet by mouth nightly as needed for Sleep 10 tablet 0   ??? Ospemifene 60 MG  TABS Take 1 tablet by mouth daily 90 tablet 1   ??? acetaminophen (TYLENOL) 325 MG tablet Take 2 tablets by mouth every 4 hours as needed for Pain 120 tablet 3   ??? albuterol sulfate HFA (VENTOLIN HFA) 108 (90 Base) MCG/ACT inhaler Inhale 2 puffs into the lungs every 4 hours as needed for Wheezing 1 Inhaler 3   ??? OXcarbazepine (TRILEPTAL) 150 MG tablet Take 1 tablet by mouth 2 times daily (Patient taking differently: Take 300 mg by mouth 2 times daily One in the am and 2 at night time) 60 tablet 3   ??? melatonin 3 MG TABS tablet Take 3 tablets by mouth nightly as needed (sleep) 90 tablet 3   ??? famotidine  (PEPCID) 20 MG tablet Take 1 tablet by mouth 2 times daily 60 tablet 3   ??? HUMALOG 100 UNIT/ML injection vial      ??? Lancets MISC      ??? NONFORMULARY Arthritis hand ointment     ??? carbidopa-levodopa (SINEMET) 10-100 MG per tablet Take 1 tablet by mouth nightly Indications: 1 to 2 tablets at bedtime      ??? montelukast (SINGULAIR) 10 MG tablet Take 10 mg by mouth nightly     ??? vitamin D (ERGOCALCIFEROL) 50000 UNITS CAPS capsule Take 50,000 Units by mouth once a week      ??? traZODone (DESYREL) 100 MG tablet Take 1 tablet by mouth nightly as needed for Sleep (Patient taking differently: Take 300 mg by mouth nightly Indications: 1 to 3 tablets for sleep at night ) 30 tablet 0   ??? buPROPion SR (WELLBUTRIN SR) 150 MG SR tablet Take 1 tablet by mouth daily 60 tablet 3   ??? lubiprostone (AMITIZA) 24 MCG capsule Take 1 capsule by mouth 2 times daily (with meals) 60 capsule 11   ??? glipiZIDE (GLUCOTROL) 10 MG tablet Take 1 tablet by mouth every morning (before breakfast) 60 tablet 3     No current facility-administered medications for this encounter.      UDS:    Lab Results   Component Value Date    AMPHETUR Negative 06/29/2015    BARBITURATES Negative 06/29/2015    BENZODIAZURI Negative 06/29/2015    CANNANBINURI Negative 06/29/2015    COCAINEMETUR Negative 06/29/2015    OPIAU Negative 06/29/2015    OXYCOOXYMO Negative 06/29/2015    PHENCYCU Negative 06/29/2015    LABMETH Negative 06/29/2015    PPXUR Negative 06/29/2015    MEPERIDU Negative 06/29/2015    FENTU Negative 06/29/2015    ETHUQN Negative 06/29/2015          Vitals:  BP 118/82   Pulse 99   Temp 98.5 ??F (36.9 ??C) (Temporal)    Resp 18   Ht 5\' 5"  (1.651 m)   Wt 210 lb (95.3 kg)   SpO2 95%   BMI 34.95 kg/m2      Physical Exam:  General appearance: no acute distress  Head: NCAT, EOMI  Skin: Warm, Dry   Musculoskeletal: ambulatory per self, steady gait  Neurologic: alert and oriented X 3, speech clear  Mood and affect: appropriate, no SI or HI    Assessment:    *       Lumbar DDD  *      Lumbar Radiculopathy    Plan:   [x]   Patient is to call with any questions or concerns which may arise prior to the next office visit    [x]   Continue current medications (Tramadol) per our office,  see medication tab, KASPER reviewed   []   Add   []   Imaging order given to patient   []   PT order given to patient   [x]   Procedure scheduled for next visit, see encounter details   []   UDS done today   []   UDS next visit   []   ...      Controlled Substance Monitoring: Discussed with patient possible medication side effects, risk of tolerance and/or dependence, and alternative treatments. Discussed the effects of long term narcotic use. Patient encouraged to set daily goals of exercising and decreasing daily narcotic intake.      Electronically signed by Donnetta Hail, APRN on 11/20/2015

## 2016-01-05 ENCOUNTER — Inpatient Hospital Stay
Admit: 2016-01-05 | Discharge: 2016-01-05 | Payer: BLUE CROSS/BLUE SHIELD | Attending: Pain Medicine | Primary: Emergency Medicine

## 2016-01-05 ENCOUNTER — Encounter

## 2016-01-05 ENCOUNTER — Inpatient Hospital Stay: Admit: 2016-01-05 | Discharge: 2016-01-05 | Payer: BLUE CROSS/BLUE SHIELD | Primary: Emergency Medicine

## 2016-01-05 DIAGNOSIS — M5116 Intervertebral disc disorders with radiculopathy, lumbar region: Secondary | ICD-10-CM

## 2016-01-05 DIAGNOSIS — M5136 Other intervertebral disc degeneration, lumbar region: Secondary | ICD-10-CM

## 2016-01-05 MED ORDER — LIDOCAINE-EPINEPHRINE 1 %-1:200000 IJ SOLN
1 percent-:200000 | Freq: Once | INTRAMUSCULAR | Status: AC | PRN
Start: 2016-01-05 — End: 2016-01-05
  Administered 2016-01-05: 14:00:00 3 via INTRADERMAL

## 2016-01-05 MED ORDER — BUPIVACAINE HCL (PF) 0.25 % IJ SOLN
0.25 % | Freq: Once | INTRAMUSCULAR | Status: AC | PRN
Start: 2016-01-05 — End: 2016-01-05
  Administered 2016-01-05: 14:00:00 5 via EPIDURAL

## 2016-01-05 MED ORDER — TRAMADOL HCL 50 MG PO TABS
50 | ORAL_TABLET | Freq: Two times a day (BID) | ORAL | 2 refills | Status: DC | PRN
Start: 2016-01-05 — End: 2016-04-05

## 2016-01-05 MED ORDER — MIDAZOLAM HCL 2 MG/2ML IJ SOLN
2 MG/ML | Freq: Once | INTRAMUSCULAR | Status: AC | PRN
Start: 2016-01-05 — End: 2016-01-05
  Administered 2016-01-05: 14:00:00 2 via INTRAVENOUS

## 2016-01-05 MED ORDER — SODIUM CHLORIDE 0.9 % IJ SOLN
0.9 % | Freq: Once | INTRAMUSCULAR | Status: AC | PRN
Start: 2016-01-05 — End: 2016-01-05
  Administered 2016-01-05: 14:00:00 4

## 2016-01-05 MED ORDER — IOHEXOL 240 MG/ML IJ SOLN
240 MG/ML | Freq: Once | INTRAMUSCULAR | Status: AC | PRN
Start: 2016-01-05 — End: 2016-01-05
  Administered 2016-01-05: 14:00:00 5

## 2016-01-05 MED ORDER — LIDOCAINE HCL (PF) 1 % IJ SOLN
1 % | Freq: Once | INTRAMUSCULAR | Status: AC | PRN
Start: 2016-01-05 — End: 2016-01-05
  Administered 2016-01-05: 14:00:00 3 via INTRADERMAL

## 2016-01-05 MED ORDER — BUPIVACAINE HCL (PF) 0.25 % IJ SOLN
0.25 | INTRAMUSCULAR | Status: AC
Start: 2016-01-05 — End: 2016-01-05

## 2016-01-05 MED ORDER — METHYLPREDNISOLONE ACETATE 80 MG/ML IJ SUSP
80 | INTRAMUSCULAR | Status: AC
Start: 2016-01-05 — End: 2016-01-05

## 2016-01-05 MED ORDER — SODIUM CHLORIDE 0.9 % IJ SOLN
0.9 | INTRAMUSCULAR | Status: AC
Start: 2016-01-05 — End: 2016-01-05

## 2016-01-05 MED ORDER — METHYLPREDNISOLONE ACETATE 80 MG/ML IJ SUSP
80 MG/ML | Freq: Once | INTRAMUSCULAR | Status: AC | PRN
Start: 2016-01-05 — End: 2016-01-05
  Administered 2016-01-05: 14:00:00 80 via EPIDURAL

## 2016-01-05 MED ORDER — MIDAZOLAM HCL 2 MG/2ML IJ SOLN
2 | INTRAMUSCULAR | Status: AC
Start: 2016-01-05 — End: 2016-01-05

## 2016-01-05 MED FILL — SODIUM CHLORIDE 0.9 % IJ SOLN: 0.9 % | INTRAMUSCULAR | Qty: 10

## 2016-01-05 MED FILL — DEPO-MEDROL 80 MG/ML IJ SUSP: 80 MG/ML | INTRAMUSCULAR | Qty: 1

## 2016-01-05 MED FILL — BUPIVACAINE HCL (PF) 0.25 % IJ SOLN: 0.25 % | INTRAMUSCULAR | Qty: 10

## 2016-01-05 MED FILL — MIDAZOLAM HCL 2 MG/2ML IJ SOLN: 2 MG/ML | INTRAMUSCULAR | Qty: 2

## 2016-01-05 NOTE — Procedures (Signed)
Patient Name: Joyce Cohen  DOB: [birth year]  MRN: [id number]    PRE-SEDATION ASSESSMENT    Procedure:  @PROCEDURE @  I have examined the patient's status immediately prior to the procedure.      BRIEF H&P    HPI/Changes/Indicators/Diagnosis  There are no active hospital problems to display for this patient.      Medications:  Prior to Admission medications    Medication Sig Start Date End Date Taking? Authorizing Provider   traMADol (ULTRAM) 50 MG tablet Take 1 tablet by mouth 2 times daily as needed for Pain . 01/17/16  Yes Beatrix Fetters, MD   insulin glargine (LANTUS) 100 UNIT/ML injection vial Inject 10 Units into the skin nightly   Yes Historical Provider, MD   QUEtiapine (SEROQUEL XR) 400 MG extended release tablet Take 400 mg by mouth nightly   Yes Historical Provider, MD   zolpidem (AMBIEN) 10 MG tablet Take 1 tablet by mouth nightly as needed for Sleep 10/24/15  Yes Deretha Emory, MD   Ospemifene 60 MG TABS Take 1 tablet by mouth daily 10/17/15  Yes Gaye Alken, MD   acetaminophen (TYLENOL) 325 MG tablet Take 2 tablets by mouth every 4 hours as needed for Pain 10/11/15  Yes Marcelina Morel Ballew, DO   albuterol sulfate HFA (VENTOLIN HFA) 108 (90 Base) MCG/ACT inhaler Inhale 2 puffs into the lungs every 4 hours as needed for Wheezing 10/11/15  Yes Marcelina Morel Ballew, DO   OXcarbazepine (TRILEPTAL) 150 MG tablet Take 1 tablet by mouth 2 times daily  Patient taking differently: Take 300 mg by mouth 2 times daily One in the am and 2 at night time 10/11/15  Yes Jeanene Erb, DO   famotidine (PEPCID) 20 MG tablet Take 1 tablet by mouth 2 times daily 10/11/15  Yes Marcelina Morel Ballew, DO   HUMALOG 100 UNIT/ML injection vial  09/01/15  Yes Historical Provider, MD   Lancets MISC  08/31/15  Yes Historical Provider, MD   NONFORMULARY Arthritis hand ointment   Yes Historical Provider, MD   carbidopa-levodopa (SINEMET) 10-100 MG per tablet Take 1 tablet by mouth nightly Indications: 1 to 2 tablets at bedtime    Yes  Historical Provider, MD   montelukast (SINGULAIR) 10 MG tablet Take 10 mg by mouth nightly   Yes Historical Provider, MD   vitamin D (ERGOCALCIFEROL) 50000 UNITS CAPS capsule Take 50,000 Units by mouth once a week  07/13/14  Yes Historical Provider, MD   traZODone (DESYREL) 100 MG tablet Take 1 tablet by mouth nightly as needed for Sleep  Patient taking differently: Take 300 mg by mouth nightly Indications: 1 to 3 tablets for sleep at night  05/16/14  Yes Maylene Roes, MD   buPROPion SR [facility] SR) 150 MG SR tablet Take 1 tablet by mouth daily 05/16/14  Yes Maylene Roes, MD   lubiprostone (AMITIZA) 24 MCG capsule Take 1 capsule by mouth 2 times daily (with meals) 03/17/14  Yes Percival Spanish, APRN       Allergies:  is allergic to tobacco [nicotiana tabacum]; decongestant [pseudoephedrine hcl]; motrin [ibuprofen]; and cabbage.    Vital Signs:  Vitals:    01/05/16 0839   BP: 129/72   Pulse: 94   Resp: 16   Temp: 97 F (36.1 C)   SpO2: 98%       Physical Exam:  Cardiac:                                    [  x]WNL                    [] Comments:    Pulmonary:                               [x] WNL                    [] Comments:    Neuro/Mental Status:               [x] WNL                    [] Comments:      Informed Consent:  The risks and benefits of the procedure have been discussed with either the patient or if they cannot consent their representative.    Assessment:  Patient examined and appropriate for the planned sedation and procedure.    Plan:  Proceed with planned sedation and procedure as above.     Mallampati Airway Assessment: Adequate      ASA STATUS:  [] 1. Normal Healty  [] 2. Mild Systemice Disease, doesn't limit activity eg HTN, mild DM  [x] 3. Severe Systemic Disease, does limit activity eg stable angina, DM with vascular         disease          [] 4.Severe Systemic Disease constant threat eg CHF, renal failure  [] 5. Moribund not expected to survive without procedure          Newt Lukes,  RN

## 2016-01-05 NOTE — Procedures (Signed)
DATE: 01/05/2016      REASON FOR VISIT: Lumbar Degenerative Disc Disease, Lumbar Radiculopathy      PROCEDURE: Lumbar Epidural Steroid Injection.   []  Moderate Sedation    DESCRIPTION OF PROCEDURE:            After obtaining informed consent, the patient was taken to the procedure room, positioned prone, and sterilely prepped. The procedure was performed under fluoroscopic guidance. First 5 ml of 1% Xylocainewas used at L4-43for local anesthesia. A 19-gauge Hustead needle was advanced to the epidural space. The was confirmed on the fluoroscope with an injection of[x] 2ml [] ml Contrast. Then,[x] 5ml of 0.25% Marcaine, [x] 4ml of Normal Saline, and [x] 80 mg of DepoMedrolwas gently injected.       There were no complications. We wanted to cover the lower back and her new complaint of coccydynia without history of trauma. Other family members also have this.    DIAGNOSES:  []  Low Back Pain  []  *Lumbar Radiculopathy  []  *Lumbar Degenerative Disc Disease  []  *Lumbar Spinal Stenosis  []  *Lumbar Postlaminectomy Syndrome  []  Other  Procedure:  Level of Consciousness: [x] Alert [x] Oriented [] Disoriented [] Lethargic  Anxiety Level: [x] Calm [] Anxious [] Depressed [] Other  Skin: [x] Warm [x] Dry [] Cool [] Moist [] Intact [] Other  Cardiovascular: [] Palpitations: [x] Never [] Occasionally [] Frequently  Chest Pain: [x] No [] Yes  Respiratory:  [x] Unlabored [] Labored [] Cough ([]  Productive [] Unproductive)  HCG Required: [x] No [] Yes   Results: [] Negative [] Positive  Knowledge Level:        [x] Patient/Other verbalized understanding of pre-procedure instructions.        [x] Assessment of post-op care needs (transportation, responsible caregiver)        [x] Able to discuss health care problems and how to deal with it.  Factors that Affect Teaching:        Language Barrier: [x] No [] Yes - why:        Hearing Loss:        [x] No [] Yes            Corrective Device:  [] Yes [] No        Vision Loss:           [] No [x] Yes             Corrective Device:  [x] Yes [] No        Memory Loss:       [x] No [] Yes            [] Short Term [] Long Term  Motivational Level:  [x] Asks Questions                  [] Extremely Anxious       [x] Seems Interested               [] Seems Uninterested                  [x] Denies need for Education  Risk for Injury:  [x] Patient oriented to person, place and time  [] History of frequent falls/loss of balance        Nursing Admission Record    Current Issues / Falls / ER Visits:  No  Percentage of Pain Relief after Last Procedure:  100%    How long lasted:  10 weeks  Radiology exams received during the last 12 months: Yes       When sept  Where [facility]       Imaging on chart: Yes         Imaging records requested: No  MRI exams received in the past 2 years:  Yes  Labs during the last 12 months: Yes          EXAM  AIRWAY ADEQUATE  LUNGS CLEAR AT AUSCULTATION  HEART RRR      COMMENTS:   Patient complains of low back and tailbone pain. Patient denies any injury to her tailbone, states she has familial history of tailbone pain. Discussed the possibility of referred pain may be source from her Lumbar Spine. Discussed treatments of tailbone injections and as last resort a coccygectomy. Patient recommended to use a u-shaped cushion to redistribute the weight.  Patient feels that he gained 100% relief that has lasted her 10 weeks from last injections. Patient was seen in the ER with hyperglycemia and medication error with her Bipolar Medications. Patient states that her Physician was on leave during this time. Patient states that she also had bout with Vertigo. Explained to the patient about the increase of Barometric pressure and the increase of allergens in this region. Patient states that she has taken all of the carpet out of her house and frequently vacuums her furniture. Patient encouraged as before to attempt to designate time daily with exercise with a focus on torso  strengthening. Discussed that if patient continues to fail conservative treatment will refer for further imaging and if warranted a surgical consult. Discussed that down the line the possibility of a Dorsal Column Stimulator placement.  Discussed the risk and benefits of procedure with patient. Will proceed today with Lumbar Epidural Steroid Injection.        PLAN:  [x]  Will return to office in  3 month(s)for  []  Planned Procedure  []  Office Visit  [x]  Prescriptions were given today   []  No prescriptions needed today  []  Patient is to call with any questions or concerns which may arise prior to the next office visit.   [] LESI                 []  Over 50% of today's appointment was given to discussion, evaluation and counseling.

## 2016-01-05 NOTE — Discharge Instructions (Signed)
Dr. Claudie Reveringiley Love  Discharge Instructions / Information        You have had the procedure(s) called:    []  Cervical Epidural     [x]  Lumbar Epidural     []  _____________Injection  []  Cervical Facets  []  Lumbar Facets []  Radiofrequency Lesioning  []  Occipital Nerve Blocks []  Caudal Epidural []  Transforaminal Epidural  []  Trigger Point Injections []  SI Joint Injection []  ______________________    Please follow these instructions carefully.      If you have questions or problems you may call [phone].     You have received the following medications:  [x]  Lidocaine []  Bupivicaine: [x]  0.50%   [x]  0.25% [x]  DepoMedrol   [x]  Normal Saline  []  Versed []  Sodium Bicarbonate   []  Isovue   []  Botox  []  Kenalog   []  Diprovan  [x]  omnipaque_____________________    PATIENT INFORMATION:  You may experience the following symptoms after your procedure.  These symptoms are normal and should not cause alarm.     An increase in our pain may occurr.  This may last 24-48 hours after your procedure.   No change in pain.   Weakness or numbness in your affected extremity.  This will usually only last a few hours.    REMEMBER TO REPORT THE FOLLOWING SYMPTOMS TO YOUR DOCTOR:   Redness, swelling. Or drainage at the injection site.   Unusual pain that interferes with your usual activities of daily living.    OTHER INSTRUCTIONS:  [x]  I will apply ice to the injection site for 24 hours.  Apply 15-2- minutes and take off 40 minutes.  [x]  I understand if a steroid was used it will take effect in 3-5 days.  [x]  I have received my personal belongings and valuable.  [x]  I have received a copy of my discharge instructions and understand to my satisfaction.  [x]  I will not drive for []  12 hours    [x]  24 hours after my injections.  [x]  I understand that today I will  [x]  rest  []  walk and move freely  []  other _________________  []  Additional instructions:  ____________________________________________________________________________        Patient  Discharge:  [x]  Home  []  Hospital  []  Other  ___________________________________    How:  []  Ambulatory  [x]  Wheelchair   []  Stretcher   []  __________________________________    Accompanied by:  [x]  Family Member  []  Friend  []  Hospital Staff  []  Ambulance Staff    []  Other_____________________________

## 2016-01-05 NOTE — Progress Notes (Signed)
Procedure:  Level of Consciousness: [x] Alert [x] Oriented [] Disoriented [] Lethargic  Anxiety Level: [x] Calm [] Anxious [] Depressed [] Other  Skin: [x] Warm [x] Dry [] Cool [] Moist [] Intact [] Other  Cardiovascular: [] Palpitations: [x] Never [] Occasionally [] Frequently  Chest Pain: [x] No [] Yes  Respiratory:  [x] Unlabored [] Labored [] Cough ([]  Productive [] Unproductive)  HCG Required: [x] No [] Yes   Results: [] Negative [] Positive  Knowledge Level:        [x] Patient/Other verbalized understanding of pre-procedure instructions.        [x] Assessment of post-op care needs (transportation, responsible caregiver)        [x] Able to discuss health care problems and how to deal with it.  Factors that Affect Teaching:        Language Barrier: [x] No [] Yes - why:        Hearing Loss:        [x] No [] Yes            Corrective Device:  [] Yes [] No        Vision Loss:           [] No [x] Yes            Corrective Device:  [x] Yes [] No        Memory Loss:       [x] No [] Yes            [] Short Term [] Long Term  Motivational Level:  [x] Asks Questions                  [] Extremely Anxious       [x] Seems Interested               [] Seems Uninterested                  [x] Denies need for Education  Risk for Injury:  [x] Patient oriented to person, place and time  [] History of frequent falls/loss of balance        Nursing Admission Record    Current Issues / Falls / ER Visits:  No    Percentage of Pain Relief after Last Procedure:  100%    How long lasted:  10 weeks    Radiology exams received during the last 12 months: Yes       When sept                                              Where [facility]       Imaging on chart: Yes         Imaging records requested: No  MRI exams received in the past 2 years:  Yes  Physical therapy during the last 6 months: No       When: na                                             Where na  Labs during the last 12 months: Yes    Education Provided:  [x]  Review of [place]  []  Agreement Review  []  Compliance Issues Discussed    []   Cognitive Behavior Needs [x]  Exercise []  Review of Test []  Financial Issues  []  Tobacco/Alcohol Use [x]  Teaching []  New Patient []  Picture Obtained    Physician Plan:  []  Outgoing Referral  []  Pharmacy Consult  []  Test Ordered   []  Obtained Test Results /  Consult Notes  []  UDS due at next visit, verified per EPIC      []  Suspected Physical Abuse or Suicide Risk assessed - IF YES COMPLETE QUESTIONS BELOW    If any of the following questions are answered yes - contact attending physician for referral:    Has been considering harming self to escape stress, pain problems?  []  YES  [x]  NO  Has a suicide plan? []  YES  [x]  NO  Has attempted suicide in the past?   []  YES  [x]  NO  Has a close friend or family member who committed suicide?  []  YES  [x]  NO    Patient Referred To :     Additional Notes:    Assessment Completed by:  Electronically signed by Laneta Simmers, RN on 01/05/2016 at 8:42 AM  [] Change in appetite   [] Weight Gain   [] Weight Loss  Functional:  [] Requires assistance with ADL's

## 2016-02-13 ENCOUNTER — Inpatient Hospital Stay
Admission: EM | Admit: 2016-02-13 | Discharge: 2016-02-22 | Disposition: A | Discharge status: Home / Self Care | Payer: BLUE CROSS/BLUE SHIELD | Source: Other Acute Inpatient Hospital | Admitting: Psychiatry

## 2016-02-13 DIAGNOSIS — F3163 Bipolar disorder, current episode mixed, severe, without psychotic features: Secondary | ICD-10-CM
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
## 2016-02-13 NOTE — ED Provider Notes (Signed)
[facility] EMERGENCY DEPT  eMERGENCY dEPARTMENT eNCOUnter      Pt Name: Joyce Cohen  MRN: [id number]  Birthdate 05/02/1955  Date of evaluation: 02/13/2016  Provider: Aris Everts, APRN    CHIEF COMPLAINT       Chief Complaint   Patient presents with   ??? Manic Behavior   ??? Suicidal         HISTORY OF PRESENT ILLNESS  (Location/Symptom, Timing/Onset, Context/Setting, Quality, Duration, Modifying Factors, Severity.)   Joyce Cohen is a 60 y.o. female who presents to the emergency department with complaints of depression, mania and suicidal ideation. Onset February 11, 2016. Patient's husband reports that the patient has a hard time around Christmas due to a difficulty childhood. He explains that the patient was not treated the same as her step-sisters. The patient is extremely tearful, crying hysterically and refusing to talk about why she is upset. She is suicidal with a plan. The patient reports she was going to use either a knife or a hammer to hurt herself.     HPI    Nursing Notes were reviewed and I agree.    REVIEW OF SYSTEMS    (2-9 systems for level 4, 10 or more for level 5)     Review of Systems   Constitutional: Negative.    HENT: Negative.    Eyes: Negative.    Respiratory: Negative for cough, chest tightness and shortness of breath.    Cardiovascular: Negative.    Gastrointestinal: Negative.    Musculoskeletal: Negative.    Neurological: Negative.    Psychiatric/Behavioral: Positive for suicidal ideas.        Crying and anxious.      PAST MEDICAL HISTORY     Past Medical History:   Diagnosis Date   ??? Abdominal pain, right lower quadrant    ??? 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 Arthritis    ??? Bipolar disorder (HCC)    ??? Blood circulation, collateral    ??? Cancer (HCC)     skin cancer   ??? Diabetes mellitus (HCC)    ??? Hearing aid worn     bilateral   ??? Hernia    ??? Hyperlipidemia    ??? MVP (mitral valve prolapse)    ??? Neuromuscular disorder (HCC) Inguinal Nerve pain   ???  Pleurisy without mention of effusion or current tuberculosis    ??? Right groin pain 02/28/2014   ??? Stomach ulcer     as a child   ??? Torn rotator cuff     right shoulder   ??? Unspecified asthma(493.90)    ??? Unspecified constipation    ??? Unspecified hemorrhoids without mention of complication          SURGICAL HISTORY       Past Surgical History:   Procedure Laterality Date   ??? ABDOMEN SURGERY     ??? ABDOMINAL EXPLORATION SURGERY      times two due to adhesions   ??? APPENDECTOMY     ??? CHOLECYSTECTOMY, LAPAROSCOPIC N/A 02/24/2015    CHOLECYSTECTOMY LAPAROSCOPIC performed by Gabriel Rainwater, MD at [facility] OR   ??? CHOLECYSTECTOMY, LAPAROSCOPIC     ??? COLON SURGERY  04/2013    1 polyp removed   ??? COLONOSCOPY  05/19/11    Dr Renato Gails   ??? COLONOSCOPY  07/15/2012    Dr. Zollie Beckers   ??? HYSTERECTOMY     ??? ROTATOR CUFF REPAIR Right    ???  SKIN BIOPSY  L and R wrists   ??? TONSILLECTOMY     ??? TUBAL LIGATION     ??? WISDOM TOOTH EXTRACTION           CURRENT MEDICATIONS       Previous Medications    ACETAMINOPHEN (TYLENOL) 325 MG TABLET    Take 2 tablets by mouth every 4 hours as needed for Pain    ALBUTEROL SULFATE HFA (VENTOLIN HFA) 108 (90 BASE) MCG/ACT INHALER    Inhale 2 puffs into the lungs every 4 hours as needed for Wheezing    BUPROPION SR (WELLBUTRIN SR) 150 MG SR TABLET    Take 1 tablet by mouth daily    CARBIDOPA-LEVODOPA (SINEMET) 10-100 MG PER TABLET    Take 1 tablet by mouth nightly Indications: 1 to 2 tablets at bedtime     DIAZEPAM (VALIUM) 5 MG TABLET    Take 5 mg by mouth every 8 hours as needed for Anxiety (pt and husband are unsure of dose) .    FAMOTIDINE (PEPCID) 20 MG TABLET    Take 1 tablet by mouth 2 times daily    HUMALOG 100 UNIT/ML INJECTION VIAL        LANCETS MISC        LUBIPROSTONE (AMITIZA) 24 MCG CAPSULE    Take 1 capsule by mouth 2 times daily (with meals)    MECLIZINE (ANTIVERT) 25 MG TABLET    Take 25 mg by mouth 3 times daily as needed    MONTELUKAST (SINGULAIR) 10 MG TABLET    Take 10 mg by mouth nightly     NONFORMULARY    Arthritis hand ointment    OSPEMIFENE 60 MG TABS    Take 1 tablet by mouth daily    OXCARBAZEPINE (TRILEPTAL) 150 MG TABLET    Take 1 tablet by mouth 2 times daily    QUETIAPINE (SEROQUEL XR) 400 MG EXTENDED RELEASE TABLET    Take 400 mg by mouth nightly    TRAMADOL (ULTRAM) 50 MG TABLET    Take 1 tablet by mouth 2 times daily as needed for Pain .    TRAZODONE (DESYREL) 100 MG TABLET    Take 1 tablet by mouth nightly as needed for Sleep    VITAMIN D (ERGOCALCIFEROL) 50000 UNITS CAPS CAPSULE    Take 50,000 Units by mouth once a week     ZOLPIDEM (AMBIEN) 10 MG TABLET    Take 1 tablet by mouth nightly as needed for Sleep       ALLERGIES     Tobacco [nicotiana tabacum]; Decongestant [pseudoephedrine hcl]; Motrin [ibuprofen]; and Cabbage    FAMILY HISTORY       Family History   Problem Relation Age of Onset   ??? Alcohol Abuse Father    ??? Mental Illness Father    ??? Alcohol Abuse Mother    ??? Mental Illness Mother    ??? Hypertension Mother    ??? Alcohol Abuse Brother    ??? Cancer Paternal Grandfather    ??? Mental Illness Maternal Grandmother    ??? Hypertension Maternal Grandmother    ??? Mental Illness Daughter    ??? Colon Cancer Neg Hx    ??? Colon Polyps Neg Hx    ??? Liver Cancer Neg Hx    ??? Stomach Cancer Neg Hx    ??? Ulcerative Colitis Neg Hx    ??? Crohn's Disease Neg Hx    ??? Liver Disease Neg Hx    ??? Rectal Cancer  Neg Hx           SOCIAL HISTORY       Social History     Social History   ??? Marital status: Married     Spouse name: Johnnie   ??? Number of children: 2   ??? Years of education: 6015     Social History Main Topics   ??? Smoking status: Never Smoker   ??? Smokeless tobacco: Never Used      Comment: Disabled due bipolar/autism   ??? Alcohol use No   ??? Drug use: No   ??? Sexual activity: Not Asked     Other Topics Concern   ??? None     Social History Narrative   ??? None       SCREENINGS           PHYSICAL EXAM    (up to 7 for level 4, 8 or more for level 5)     ED Triage Vitals   BP Temp Temp src Pulse Resp SpO2 Height  Weight   -- -- -- -- -- -- -- --       Physical Exam   Constitutional: She is oriented to person, place, and time. She appears well-developed and well-nourished.   HENT:   Head: Normocephalic and atraumatic.   Right Ear: External ear normal.   Left Ear: External ear normal.   Nose: Nose normal.   Mouth/Throat: Oropharynx is clear and moist.   Eyes: Conjunctivae and EOM are normal. Pupils are equal, round, and reactive to light.   Neck: Normal range of motion. Neck supple.   Cardiovascular: Normal rate, regular rhythm and normal heart sounds.    Pulmonary/Chest: Effort normal and breath sounds normal.   Abdominal: Soft. Bowel sounds are normal.   Musculoskeletal: Normal range of motion.   Neurological: She is alert and oriented to person, place, and time.   Skin: Skin is warm and dry.   Psychiatric: Her speech is normal. She is withdrawn. She exhibits a depressed mood. She expresses suicidal ideation. She expresses suicidal plans.   Patient is covering her eyes with her hands. She does not make eye contact and is crying.    Vitals reviewed.    DIAGNOSTIC RESULTS     RADIOLOGY:   Non-plain film images such as CT, Ultrasound and MRI are read by the radiologist. Plain radiographic images are visualized and preliminarily interpreted by Salome HolmesJonathan C Brantley, MD with the below findings:      Interpretation per the Radiologist below, if available at the time of this note:    No orders to display       LABS:  Labs Reviewed   CBC WITH AUTO DIFFERENTIAL - Abnormal; Notable for the following:        Result Value    Neutrophils % 44.0 (*)     Lymphocytes % 42.5 (*)     Eosinophils % 5.5 (*)     Basophils % 1.2 (*)     All other components within normal limits   COMPREHENSIVE METABOLIC PANEL - Abnormal; Notable for the following:     Glucose 139 (*)     CREATININE 1.0 (*)     GFR Non-African American 57 (*)     ALT 40 (*)     All other components within normal limits   URINALYSIS - Abnormal; Notable for the following:      Leukocyte Esterase, Urine SMALL (*)     All other components within normal limits  URINE DRUG SCREEN - Abnormal; Notable for the following:     Benzodiazepine Screen, Urine Positive (*)     All other components within normal limits   MICROSCOPIC URINALYSIS - Abnormal; Notable for the following:     WBC, UA 10 (*)     All other components within normal limits   URINE CULTURE   ETHANOL   ACETAMINOPHEN LEVEL       All other labs were within normal range or not returned as of this dictation.    RE-ASSESSMENT        EMERGENCY DEPARTMENT COURSE and DIFFERENTIAL DIAGNOSIS/MDM:   Vitals:  There were no vitals filed for this visit.        MDM  Number of Diagnoses or Management Options  Depression with suicidal ideation:   Diagnosis management comments: Diagnosis management comments:   60 year old female presents to the emergency room with her husband with complaints of depression, mania and suicidal ideation. Onset was Christmas Eve. The patient reports that due to "a bad childhood" she does not deal with Christmas well. She is crying, will not make eye contact or discuss why she had a bad childhood. Her husband reports that the patient was not treated the same as her siblings during the Christmas season and since this she becomes depressed, manic and suicidal. The patient does have a plan to hurt herself using a knife or a hammer. She did not explain how she was going to hurt herself.       Case discussed with Dr. Lottie Rater.     Patient was evaluated by Junious Dresser, intake staff. The patient will be admitted to the behavioral health floor.     Emergency Room Treatment Plan:  The patient was evaluated. Blood and urine specimen will be obtained and sent to lab for analysis. Patient will be evaluated by behavioral health intake staff. Patient will be admitted to the behavioral health floor.         Procedures      FINAL IMPRESSION      1. Depression with suicidal ideation          DISPOSITION/PLAN   DISPOSITION        PATIENT  REFERRED TO:  No follow-up provider specified.    DISCHARGE MEDICATIONS:  New Prescriptions    No medications on file       (Please note that portions of this note were completed with a voice recognition program.  Efforts were made to edit the dictations but occasionally words are mis-transcribed.)    Aris Everts, APRN       Aris Everts, APRN  02/13/16 [id number]

## 2016-02-13 NOTE — ED Provider Notes (Signed)
Attending Supervisory Note/Shared Visit   I have personally performed a face to face diagnostic evaluation on this patient. I have reviewed the mid-level???s findings and agree.     Briefly, patient is a 60 year old female here for depression. Has had worsening depression recently. States this usually happens around the holidays. No hallucinations or delusions. Having suicidal ideation. Has not taken excessive amount of any Medications or overdosed in any way. States she is taking all of her medications as prescribed. Has been seen by intake and discussed with on-call psychiatrist who will admit. Patient resting comfortably in no distress at this time. She is agreeable to plan.      FINAL IMPRESSION      1. Depression with suicidal ideation          Salome HolmesJonathan C Santos Hardwick, MD  Attending Emergency Physician      Salome HolmesJonathan C Brizeida Mcmurry, MD  02/13/16 [phone]

## 2016-02-14 LAB — POCT GLUCOSE
POC Glucose: 140 mg/dl — ABNORMAL HIGH (ref 70–99)
POC Glucose: 236 mg/dl — ABNORMAL HIGH (ref 70–99)

## 2016-02-14 LAB — MICROSCOPIC URINALYSIS
Bacteria, UA: NEGATIVE /HPF
Epithelial Cells, UA: 12 /HPF (ref 0–5)
Hyaline Casts, UA: 5 /HPF (ref 0–8)
RBC, UA: 2 /HPF (ref 0–4)
WBC, UA: 10 /HPF — ABNORMAL HIGH (ref 0–5)

## 2016-02-14 LAB — COMPREHENSIVE METABOLIC PANEL
ALT: 40 U/L — ABNORMAL HIGH (ref 5–33)
AST: 26 U/L (ref 5–32)
Albumin: 4.4 g/dL (ref 3.5–5.2)
Alkaline Phosphatase: 91 U/L (ref 35–104)
Anion Gap: 14 mmol/L (ref 7–19)
BUN: 15 mg/dL (ref 6–20)
CO2: 23 mmol/L (ref 22–29)
Calcium: 9.7 mg/dL (ref 8.6–10.0)
Chloride: 103 mmol/L (ref 98–111)
Creatinine: 1 mg/dL — ABNORMAL HIGH (ref 0.5–0.9)
GFR Non-African American: 57 — AB (ref >60)
Glucose: 139 mg/dL — ABNORMAL HIGH (ref 74–109)
Potassium: 4.2 mmol/L (ref 3.5–5.0)
Sodium: 140 mmol/L (ref 136–145)
Total Bilirubin: 0.3 mg/dL (ref 0.2–1.2)
Total Protein: 7.1 g/dL (ref 6.6–8.7)

## 2016-02-14 LAB — URINALYSIS
Bilirubin Urine: NEGATIVE
Blood, Urine: NEGATIVE
Glucose, Ur: NEGATIVE mg/dL
Ketones, Urine: NEGATIVE mg/dL
Nitrite, Urine: NEGATIVE
Protein, UA: NEGATIVE mg/dL
Specific Gravity, UA: 1.014 (ref 1.005–1.030)
Urobilinogen, Urine: 0.2 E.U./dL (ref <2.0)
pH, UA: 6.5 (ref 5.0–8.0)

## 2016-02-14 LAB — CBC WITH AUTO DIFFERENTIAL
Basophils %: 1.2 % — ABNORMAL HIGH (ref 0.0–1.0)
Basophils Absolute: 0.1 10*3/uL (ref 0.00–0.20)
Eosinophils %: 5.5 % — ABNORMAL HIGH (ref 0.0–5.0)
Eosinophils Absolute: 0.4 10*3/uL (ref 0.00–0.60)
Hematocrit: 44.2 % (ref 37.0–47.0)
Hemoglobin: 14.9 g/dL (ref 12.0–16.0)
Lymphocytes %: 42.5 % — ABNORMAL HIGH (ref 20.0–40.0)
Lymphocytes Absolute: 3.3 10*3/uL (ref 1.1–4.5)
MCH: 28.6 pg (ref 27.0–31.0)
MCHC: 33.7 g/dL (ref 33.0–37.0)
MCV: 84.8 fL (ref 81.0–99.0)
MPV: 10.4 fL (ref 9.4–12.3)
Monocytes %: 6.4 % (ref 0.0–10.0)
Monocytes Absolute: 0.5 10*3/uL (ref 0.00–0.90)
Neutrophils %: 44 % — ABNORMAL LOW (ref 50.0–65.0)
Neutrophils Absolute: 3.4 10*3/uL (ref 1.5–7.5)
Platelets: 196 10*3/uL (ref 130–400)
RBC: 5.21 M/uL (ref 4.20–5.40)
RDW: 12 % (ref 11.5–14.5)
WBC: 7.8 10*3/uL (ref 4.8–10.8)

## 2016-02-14 LAB — URINE DRUG SCREEN
Amphetamine Screen, Urine: NEGATIVE (ref <1000)
Barbiturate Screen, Ur: NEGATIVE (ref <200)
Benzodiazepine Screen, Urine: POSITIVE — AB (ref <100)
Cannabinoid Scrn, Ur: NEGATIVE (ref <50)
Cocaine Metabolite Screen, Urine: NEGATIVE (ref <300)
Opiate Scrn, Ur: NEGATIVE (ref <300)

## 2016-02-14 LAB — ETHANOL: Ethanol Lvl: <10 mg/dL

## 2016-02-14 LAB — ACETAMINOPHEN LEVEL: Acetaminophen Level: <15 ug/mL

## 2016-02-14 MED ORDER — ZOLPIDEM TARTRATE 5 MG PO TABS
5 MG | Freq: Once | ORAL | Status: AC | PRN
Start: 2016-02-14 — End: 2016-02-14

## 2016-02-14 MED ORDER — QUETIAPINE FUMARATE 100 MG PO TABS
100 MG | Freq: Every evening | ORAL | Status: AC
Start: 2016-02-14 — End: 2016-02-14
  Administered 2016-02-14: 07:00:00 400 mg via ORAL

## 2016-02-14 MED ORDER — DIAZEPAM 5 MG PO TABS
5 MG | Freq: Once | ORAL | Status: AC
Start: 2016-02-14 — End: 2016-02-14
  Administered 2016-02-14: 07:00:00 5 mg via ORAL

## 2016-02-14 MED ORDER — TRAZODONE HCL 100 MG PO TABS
100 MG | Freq: Once | ORAL | Status: AC
Start: 2016-02-14 — End: 2016-02-14
  Administered 2016-02-14: 07:00:00 100 mg via ORAL

## 2016-02-14 MED FILL — DIAZEPAM 5 MG PO TABS: 5 MG | ORAL | Qty: 1

## 2016-02-14 MED FILL — TRAZODONE HCL 100 MG PO TABS: 100 MG | ORAL | Qty: 1

## 2016-02-14 MED FILL — QUETIAPINE FUMARATE 300 MG PO TABS: 300 MG | ORAL | Qty: 1

## 2016-02-14 MED FILL — QUETIAPINE FUMARATE 100 MG PO TABS: 100 MG | ORAL | Qty: 1

## 2016-02-14 NOTE — Progress Notes (Signed)
Dr Daphane Shepherd returned called and ordered a one time dose of seroquel, valium, trazadone and ambien for tonight.

## 2016-02-14 NOTE — Plan of Care (Signed)
Problem: Health Behavior:  Goal: Ability to verbalize adaptive coping strategies to use when suicidal feelings occur will improve  Ability to verbalize adaptive coping strategies to use when suicidal feelings occur will improve   Outcome: Ongoing    Goal: Ability to verbalize adaptive coping strategies to use when the urge to self-mutilate occurs will improve  Ability to verbalize adaptive coping strategies to use when the urge to self-mutilate occurs will improve   Outcome: Ongoing      Problem: Safety:  Goal: Ability to contract for his/her safety will improve  Ability to contract for his/her safety will improve   Outcome: Ongoing      Problem: Falls - Risk of:  Goal: Will remain free from falls  Will remain free from falls   Outcome: Met This Shift    Goal: Absence of physical injury  Absence of physical injury   Outcome: Met This Shift      Problem: Altered Mood, Depressive Behavior  Goal: LTG-Able to verbalize acceptance of life and situations over which he or she has no control  Outcome: Ongoing    Goal: LTG-Able to verbalize and/or display a decrease in depressive symptoms  Outcome: Ongoing    Goal: STG-Able to verbalize suicidal ideations  Outcome: Met This Shift    Goal: STG-Able to verbalize support system  Outcome: Ongoing    Goal: LTG-Absence of self-harm  Outcome: Met This Shift    Goal: STG-Knowledge of positive coping patterns  Outcome: Ongoing    Goal: Participation in care planning  Outcome: Ongoing

## 2016-02-14 NOTE — Progress Notes (Signed)
SW met with treatment team to discuss pt's progress and setbacks.  SW 2 was present.  Pt was admitted for SI, reports thoughts of self-harming behaviors, has hx of psychiatric treatment, hx of psychiatric admissions, hx of SA.  Since admission, pt reportedly slept last night after receiving medications, cooperative with admission process, endorses SI, denies HI/AVH, continue current treatment plan.

## 2016-02-14 NOTE — Progress Notes (Signed)
ALERT AND ORIENTED X 4. DENIES SI, HI AND AVH. RATES DEPRESSION AND ANXIETY AS SAME OR 6. STATES SLEPT FAIR LAST NIGHT.

## 2016-02-14 NOTE — Progress Notes (Signed)
Group Therapy Note    DATE  02/14/16    Number of Participants 09              Patient's Goal:  ALL THAT I CAN    Notes:   Status After Intervention:  WENT BACK TO BED    Participation Level: DIDN'T TALK TO ANY ONE    Participation Quality: FAIR      Speech: GOOD      Thought Process/Content: FAIR      Affective Functioning: WENT BACK TO BED      Mood: GOOD      Level of consciousness:  GOOD                            Signature:  Doreatha Martin

## 2016-02-14 NOTE — Progress Notes (Signed)
[facility] Admission From ED  Nursing Admission Note    Admission Type: Voluntary    Reason for Admission: suicidal    Patient Active Problem List   Diagnosis   . RLQ abdominal pain   . Rectal bleeding   . Constipation   . Chronic constipation   . Obesity (BMI 30-39.9)   . MVP (mitral valve prolapse)   . Type 2 diabetes mellitus (HCC)   . Right lumbar radiculopathy   . Chronic constipation   . Hypercholesteremia   . Vitamin D deficiency   . Suicidal ideations   . Right groin pain   . Ilioinguinal neuralgia of right side   . Bipolar disorder current episode depressed (HCC)   . Suicidal ideation   . Posttraumatic stress disorder   . Ilioinguinal neuralgia of right side   . Right inguinal pain   . Lumbar disc disease with radiculopathy   . Lumbar radiculopathy   . Acalculous cholecystitis   . Right lumbar radiculopathy   . Complex regional pain syndrome I   . Bipolar I disorder with mixed features (HCC)   . Borderline personality disorder in adult   . Chronic pain associated with significant psychosocial dysfunction   . DDD (degenerative disc disease), lumbar       Pt admitted from Dr.s care in ED to Adult [facility] room # 617. Arrived on unit via [facility] with security escort. Pt appropriately attired in hospital scrubs. Body assessment completed by [facility] and Dawn, RN with no contraband discovered. All tubes, lines, and drains were appropriately discontinued by ED staff prior to pt transfer to B[facility]. Pt belongings and valuables inventoried and cataloged by [employer] and stored per policy. Pt oriented to surroundings, program expectations, and copy pt rights given. Received admit packet: BH Handbook, Visitation Info, Fall Prevention, Higher education careers adviser. Consents reviewed, signed Pt Rights, Handbook Acceptance, Visit/Call Acceptance, PHI Release, Social Info Release, and Treatment Agreement. Pt verbalizes understanding.  patient alert and oriented at this time with no obvious s/s of distress voiced or noted. No c/o pain. Vital sign  stable  Admission complete by Connie,RN.    Identifies stressors..    Status and Exam  Normal: No  Facial Expression: Avoids Gaze, Flat, Sad  Affect: Congruent  Level of Consciousness: Alert  Mood:Normal: No  Mood: Depressed, Anxious, Sad  Motor Activity:Normal: Yes  Interview Behavior: Cooperative  Preception: Orient to Person, Orient to Time, Orient to Place, Orient to Situation  Attention:Normal: Yes  Thought Processes: Circumstantial  Thought Content:Normal: No  Thought Content: Compulsions  Hallucinations: None  Delusions: No  Memory:Normal: Yes  Insight and Judgment: Poor Judgment, Poor Insight  Present Suicidal Ideation: Yes  Present Homicidal Ideation: No    Burna Sis, RN

## 2016-02-14 NOTE — Progress Notes (Signed)
RT leisure assessment is complete.  Pt will be encouraged to attend scheduled group activities to address leisure and social deficits.

## 2016-02-14 NOTE — H&P (Signed)
History and Physical      CHIEF COMPLAINT:  Depression, SI    Reason for Admission:  Depression, SI    History Obtained From:  patient    HISTORY OF PRESENT ILLNESS:      The patient is a 60 y.o. female [facility] worsening mood issues. She denies any CP or SOA. She has no  abdominal pain or N/V. She has no issues with recent illnesses or fevers. No dysuria. She has no pain complaints.     Past Medical History:        Diagnosis Date   ??? Abdominal pain, right lower quadrant    ??? 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 Arthritis    ??? Bipolar disorder (HCC)    ??? Blood circulation, collateral    ??? Cancer (HCC)     skin cancer   ??? Diabetes mellitus (HCC)    ??? Hearing aid worn     bilateral   ??? Hernia    ??? Hyperlipidemia    ??? MVP (mitral valve prolapse)    ??? Neuromuscular disorder (HCC) Inguinal Nerve pain   ??? Pleurisy without mention of effusion or current tuberculosis    ??? Right groin pain 02/28/2014   ??? Stomach ulcer     as a child   ??? Torn rotator cuff     right shoulder   ??? Unspecified asthma(493.90)    ??? Unspecified constipation    ??? Unspecified hemorrhoids without mention of complication      Past Surgical History:        Procedure Laterality Date   ??? ABDOMEN SURGERY     ??? ABDOMINAL EXPLORATION SURGERY      times two due to adhesions   ??? APPENDECTOMY     ??? CHOLECYSTECTOMY, LAPAROSCOPIC N/A 02/24/2015    CHOLECYSTECT[facility]   ??? CHOLECYSTECTOMY, LAPAROSCOPIC     ??? COLON SURGERY  04/2013    1 polyp removed   ??? COLONOSCOPY  05/19/11    Dr Renato Gailseed   ??? COLONOSCOPY  07/15/2012    Dr. Zollie BeckersHendon   ??? HYSTERECTOMY     ??? ROTATOR CUFF REPAIR Right    ??? SKIN BIOPSY  L and R wrists   ??? TONSILLECTOMY     ??? TUBAL LIGATION     ??? WISDOM TOOTH EXTRACTION           Medications Prior to Admission:    Prescriptions Prior to Admission: diclofenac sodium 1 % GEL, Apply 2 g topically daily as needed for Pain  lubiprostone (AMITIZA) 24 MCG capsule, Take 24 mcg  by mouth 2 times daily (with meals)  Cholecalciferol (VITAMIN D3) 50000 units CAPS, Take 50,000 Units by mouth  diazepam (VALIUM) 5 MG tablet, Take 5 mg by mouth 3 times daily  .  meclizine (ANTIVERT) 25 MG tablet, Take 25 mg by mouth 3 times daily as needed  traMADol (ULTRAM) 50 MG tablet, Take 1 tablet by mouth 2 times daily as needed for Pain .  QUEtiapine (SEROQUEL XR) 400 MG extended release tablet, Take 200 mg by mouth nightly   zolpidem (AMBIEN) 10 MG tablet, Take 1 tablet by mouth nightly as needed for Sleep  Ospemifene 60 MG TABS, Take 1 tablet by mouth daily  acetaminophen (TYLENOL) 325 MG tablet, Take 2 tablets by mouth every 4 hours as needed for Pain  albuterol sulfate HFA (  VENTOLIN HFA) 108 (90 Base) MCG/ACT inhaler, Inhale 2 puffs into the lungs every 4 hours as needed for Wheezing  OXcarbazepine (TRILEPTAL) 150 MG tablet, Take 1 tablet by mouth 2 times daily (Patient taking differently: Take 300 mg by mouth 2 times daily One in the am and 2 at night time)  famotidine (PEPCID) 20 MG tablet, Take 1 tablet by mouth 2 times daily  HUMALOG 100 UNIT/ML injection vial,   Lancets MISC,   NONFORMULARY, Arthritis hand ointment  carbidopa-levodopa (SINEMET) 10-100 MG per tablet, Take 1 tablet by mouth nightly Indications: 1 to 2 tablets at bedtime   montelukast (SINGULAIR) 10 MG tablet, Take 10 mg by mouth nightly  vitamin D (ERGOCALCIFEROL) 50000 UNITS CAPS capsule, Take 50,000 Units by mouth once a week   traZODone (DESYREL) 100 MG tablet, Take 1 tablet by mouth nightly as needed for Sleep (Patient taking differently: Take 300 mg by mouth nightly Indications: 1 to 3 tablets for sleep at night )  buPROPion SR (WELLBUTRIN SR) 150 MG SR tablet, Take 1 tablet by mouth daily  lubiprostone (AMITIZA) 24 MCG capsule, Take 1 capsule by mouth 2 times daily (with meals)  insulin glargine (LANTUS) 100 UNIT/ML injection vial, Inject 15 Units into the skin daily    Allergies:  Tobacco [nicotiana tabacum]; Decongestant  [pseudoephedrine hcl]; Motrin [ibuprofen]; and Cabbage    Social History:   TOBACCO:   reports that she has never smoked. She has never used smokeless tobacco.  ETOH:   reports that she does not drink alcohol.  DRUGS:   reports that she does not use drugs.  MARITAL STATUS:  married  OCCUPATION:  Not working  Patient currently lives with family       Family History:       Problem Relation Age of Onset   ??? Alcohol Abuse Father    ??? Mental Illness Father    ??? Alcohol Abuse Mother    ??? Mental Illness Mother    ??? Hypertension Mother    ??? Alcohol Abuse Brother    ??? Cancer Paternal Grandfather    ??? Mental Illness Maternal Grandmother    ??? Hypertension Maternal Grandmother    ??? Mental Illness Daughter    ??? Colon Cancer Neg Hx    ??? Colon Polyps Neg Hx    ??? Liver Cancer Neg Hx    ??? Stomach Cancer Neg Hx    ??? Ulcerative Colitis Neg Hx    ??? Crohn's Disease Neg Hx    ??? Liver Disease Neg Hx    ??? Rectal Cancer Neg Hx      REVIEW OF SYSTEMS:  Constitutional: neg  CV: neg  Pulmonary: neg  GI: neg  GU: neg  Psych: depression, SI  Neuro: neg  Skin: neg  MusculoSkeletal: neg    Vitals:  BP 126/80    Pulse 89    Temp 97.6 ??F (36.4 ??C) (Temporal)    Resp 16    Ht 5\' 5"  (1.651 m)    Wt 208 lb (94.3 kg)    SpO2 95%    BMI 34.61 kg/m??     PHYSICAL EXAM:  Gen: NAD, alert  Neck: no JVD or LAD  Chest: CTA bilat  CV: RRR  Abdomen: NT/ND  Extrem: no C/C/E  Neuro: non focal  Skin: no rashes    DATA:  I have reviewed the admission labs and imaging tests.    ASSESSMENT AND PLAN:      Patient Active Hospital Problem List:  Bipolar disorder current episode depressed, SI---follow with Psych   DM2---follow with glucose   Chronic Pain   Elevated BP----follow with BP   Elevated LFT's                  Danny Lawless, MD  11:34 PM 02/14/2016

## 2016-02-14 NOTE — Plan of Care (Signed)
Problem: Health Behavior:  Goal: Ability to verbalize adaptive coping strategies to use when suicidal feelings occur will improve  Ability to verbalize adaptive coping strategies to use when suicidal feelings occur will improve   Outcome: Ongoing    Goal: Ability to verbalize adaptive coping strategies to use when the urge to self-mutilate occurs will improve  Ability to verbalize adaptive coping strategies to use when the urge to self-mutilate occurs will improve   Outcome: Ongoing      Problem: Safety:  Goal: Ability to contract for his/her safety will improve  Ability to contract for his/her safety will improve   Outcome: Met This Shift      Problem: Falls - Risk of:  Goal: Will remain free from falls  Will remain free from falls   Outcome: Met This Shift    Goal: Absence of physical injury  Absence of physical injury   Outcome: Met This Shift      Problem: Altered Mood, Depressive Behavior  Goal: LTG-Able to verbalize acceptance of life and situations over which he or she has no control  Outcome: Ongoing    Goal: LTG-Able to verbalize and/or display a decrease in depressive symptoms  Outcome: Ongoing    Goal: STG-Able to verbalize suicidal ideations  Outcome: Met This Shift  Denies SI  Goal: STG-Able to verbalize support system  Outcome: Ongoing    Goal: LTG-Absence of self-harm  Outcome: Met This Shift    Goal: STG-Knowledge of positive coping patterns  Outcome: Ongoing    Goal: Participation in care planning  Outcome: Ongoing

## 2016-02-14 NOTE — Progress Notes (Signed)
Group Therapy Note    Date: 02/14/2016  Start Time: 1100  End Time:  1200  Number of Participants: 4    Type of Group: Geophysicist/field seismologist Information  Module Name:  Stress  Session Number:  5    Patient's Goal:  To improve knowledge of effective stress management techniques    Notes:  Pt did not attend group discussion.  Pt was invited/encouraged.    Status After Intervention:      Participation Level:     Participation Quality:       Speech:        Thought Process/Content:       Affective Functioning:       Mood:       Level of consciousness:        Response to Learning:       Endings:     Modes of Intervention:       Discipline Responsible:       Signature:  Clydell Hakim
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
## 2016-02-14 NOTE — Plan of Care (Signed)
Problem: Altered Mood, Depressive Behavior  Goal: STG-Knowledge of positive coping patterns  Outcome: Ongoing                                                                      Group Therapy Note    Date: 02/14/2016  Start Time: 1430  End Time:  1530  Number of Participants: 4    Type of Group: Recovery    Wellness Binder Information  Module Name:  Reducing Relapses  Session Number:  5    Patient's Goal:  To improve knowledge of positive coping skills    Notes:  Pt participated in group discussion, demonstrated the ability to identify positive coping skills.    Status After Intervention:  Unchanged    Participation Level: Interactive    Participation Quality: Attentive      Speech:  normal      Thought Process/Content: Logical      Affective Functioning: Flat      Mood: anxious and depressed      Level of consciousness:  Alert and Oriented x4      Response to Learning: Able to verbalize current knowledge/experience, Able to verbalize/acknowledge new learning and Progressing to goal      Endings: None Reported    Modes of Intervention: Education      Discipline Responsible: Psychoeducational Specialist      Signature:  Clydell Hakim

## 2016-02-14 NOTE — Progress Notes (Signed)
Group Therapy Note    Date: 02/14/2016  Start Time: 1000  End Time:  1100  Number of Participants: 4    Type of Group: Psychoeducation    Wellness Binder Information  Module Name:  Stress  Session Number:  1    Patient's Goal:  To raise awareness of the sources and signs of stress    Notes:  Pt did not attend group discussion.  Pt was invited/encouraged.    Status After Intervention:      Participation Level:     Participation Quality:       Speech:        Thought Process/Content:       Affective Functioning:       Mood:       Level of consciousness:        Response to Learning:       Endings:     Modes of Intervention:       Discipline Responsible:       Signature:  Clydell Hakim

## 2016-02-14 NOTE — Progress Notes (Signed)
Pt has been resting in bed with eyes closed since receiving medications tonight. No obvious s/s of distress voiced or noted and no c/o pain. Will continue to monitor patient.
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
## 2016-02-14 NOTE — Progress Notes (Signed)
SWK I completed psychosocial and CSSR-S on this date. Pt long and short term goals discussed. Pt voiced understanding. Treatment sheet signed.  ??  In the last 6 months has the pt been danger to self: Yes  In the last 6 months has the pt been danger to others: No  ??  Provided pt with [employer] handout entitled "Quitting Smoking," reviewed handout with pt addressing dangers of smoking, developing coping skills, and providing basic information about quitting.    Patient declined practical counseling of tobacco practical counseling during the hospital stay.     Electronically signed by Caleen EssexPortia Sephora Boyar on 02/14/2016 at 10:02 AM

## 2016-02-15 LAB — POCT GLUCOSE
POC Glucose: 156 mg/dl — ABNORMAL HIGH (ref 70–99)
POC Glucose: 146 mg/dl — ABNORMAL HIGH (ref 70–99)
POC Glucose: 221 mg/dl — ABNORMAL HIGH (ref 70–99)
POC Glucose: 175 mg/dl — ABNORMAL HIGH (ref 70–99)

## 2016-02-15 LAB — LIPID PANEL
Cholesterol, Total: 205 mg/dL — ABNORMAL HIGH (ref 160–199)
HDL: 39 mg/dL — ABNORMAL LOW (ref 65–121)
LDL Calculated: 117 mg/dL (ref <100)
Triglycerides: 243 mg/dL — ABNORMAL HIGH (ref 0–149)

## 2016-02-15 LAB — HEMOGLOBIN A1C: Hemoglobin A1C: 7 % — ABNORMAL HIGH

## 2016-02-15 LAB — CULTURE, URINE: Urine Culture, Routine: >50000

## 2016-02-15 MED ORDER — DEXTROSE 5 % IV SOLN
5 | INTRAVENOUS | Status: DC | PRN
Start: 2016-02-15 — End: 2016-02-21

## 2016-02-15 MED ORDER — DIAZEPAM 5 MG PO TABS
5 MG | Freq: Two times a day (BID) | ORAL | Status: DC
Start: 2016-02-15 — End: 2016-02-16
  Administered 2016-02-15 – 2016-02-16 (×3): 5 mg via ORAL

## 2016-02-15 MED ORDER — ZOLPIDEM TARTRATE 5 MG PO TABS
5 MG | Freq: Every evening | ORAL | Status: DC | PRN
Start: 2016-02-15 — End: 2016-02-14

## 2016-02-15 MED ORDER — TRAMADOL HCL 50 MG PO TABS
50 MG | Freq: Two times a day (BID) | ORAL | Status: DC | PRN
Start: 2016-02-15 — End: 2016-02-14

## 2016-02-15 MED ORDER — LUBIPROSTONE 24 MCG PO CAPS
24 MCG | Freq: Two times a day (BID) | ORAL | Status: DC
Start: 2016-02-15 — End: 2016-02-21
  Administered 2016-02-15 – 2016-02-18 (×3): 24 ug via ORAL

## 2016-02-15 MED ORDER — DIAZEPAM 5 MG PO TABS
5 MG | Freq: Three times a day (TID) | ORAL | Status: DC
Start: 2016-02-15 — End: 2016-02-14
  Administered 2016-02-15: 02:00:00 5 mg via ORAL

## 2016-02-15 MED ORDER — INSULIN GLARGINE 100 UNIT/ML SC SOLN
100 UNIT/ML | Freq: Every day | SUBCUTANEOUS | Status: DC
Start: 2016-02-15 — End: 2016-02-18
  Administered 2016-02-15 – 2016-02-18 (×4): 15 [IU] via SUBCUTANEOUS

## 2016-02-15 MED ORDER — INSULIN LISPRO 100 UNIT/ML SC SOLN
100 UNIT/ML | Freq: Every evening | SUBCUTANEOUS | Status: DC
Start: 2016-02-15 — End: 2016-02-21
  Administered 2016-02-15 – 2016-02-18 (×4): 1 [IU] via SUBCUTANEOUS
  Administered 2016-02-19: 02:00:00 2 [IU] via SUBCUTANEOUS
  Administered 2016-02-20 – 2016-02-21 (×2): 1 [IU] via SUBCUTANEOUS

## 2016-02-15 MED ORDER — DEXTROSE 50 % IV SOLN
50 % | INTRAVENOUS | Status: DC | PRN
Start: 2016-02-15 — End: 2016-02-21

## 2016-02-15 MED ORDER — INSULIN LISPRO 100 UNIT/ML SC SOLN
100 UNIT/ML | Freq: Three times a day (TID) | SUBCUTANEOUS | Status: DC
Start: 2016-02-15 — End: 2016-02-21
  Administered 2016-02-15: 18:00:00 2 [IU] via SUBCUTANEOUS
  Administered 2016-02-15 (×2): 1 [IU] via SUBCUTANEOUS
  Administered 2016-02-16: 22:00:00 2 [IU] via SUBCUTANEOUS
  Administered 2016-02-16: 18:00:00 1 [IU] via SUBCUTANEOUS
  Administered 2016-02-17: 18:00:00 2 [IU] via SUBCUTANEOUS
  Administered 2016-02-17 – 2016-02-18 (×2): 1 [IU] via SUBCUTANEOUS
  Administered 2016-02-18: 18:00:00 3 [IU] via SUBCUTANEOUS
  Administered 2016-02-19: 13:00:00 1 [IU] via SUBCUTANEOUS
  Administered 2016-02-19 – 2016-02-20 (×3): 2 [IU] via SUBCUTANEOUS
  Administered 2016-02-21: 14:00:00 1 [IU] via SUBCUTANEOUS
  Administered 2016-02-21 (×2): 2 [IU] via SUBCUTANEOUS

## 2016-02-15 MED ORDER — TRAZODONE HCL 100 MG PO TABS
100 MG | Freq: Every evening | ORAL | Status: DC
Start: 2016-02-15 — End: 2016-02-14
  Administered 2016-02-15: 02:00:00 300 mg via ORAL

## 2016-02-15 MED ORDER — FAMOTIDINE 20 MG PO TABS
20 MG | Freq: Two times a day (BID) | ORAL | Status: DC
Start: 2016-02-15 — End: 2016-02-21
  Administered 2016-02-15 – 2016-02-22 (×15): 20 mg via ORAL

## 2016-02-15 MED ORDER — MONTELUKAST SODIUM 10 MG PO TABS
10 MG | Freq: Every evening | ORAL | Status: DC
Start: 2016-02-15 — End: 2016-02-21
  Administered 2016-02-15 – 2016-02-22 (×8): 10 mg via ORAL

## 2016-02-15 MED ORDER — GLUCAGON HCL RDNA (DIAGNOSTIC) 1 MG IJ SOLR
1 | INTRAMUSCULAR | Status: DC | PRN
Start: 2016-02-15 — End: 2016-02-21

## 2016-02-15 MED ORDER — BUPROPION HCL ER (SR) 150 MG PO TB12
150 MG | Freq: Every day | ORAL | Status: DC
Start: 2016-02-15 — End: 2016-02-21
  Administered 2016-02-15 – 2016-02-21 (×7): 150 mg via ORAL

## 2016-02-15 MED ORDER — OSPEMIFENE 60 MG PO TABS
60 MG | Freq: Every day | ORAL | Status: DC
Start: 2016-02-15 — End: 2016-02-21

## 2016-02-15 MED ORDER — OXCARBAZEPINE 150 MG PO TABS
150 MG | Freq: Two times a day (BID) | ORAL | Status: DC
Start: 2016-02-15 — End: 2016-02-14
  Administered 2016-02-15: 02:00:00 150 mg via ORAL

## 2016-02-15 MED ORDER — VITAMIN D (ERGOCALCIFEROL) 1.25 MG (50000 UT) PO CAPS
1.25 MG (50000 UT) | ORAL | Status: DC
Start: 2016-02-15 — End: 2016-02-21
  Administered 2016-02-15: 13:00:00 50000 [IU] via ORAL

## 2016-02-15 MED ORDER — QUETIAPINE FUMARATE ER 200 MG PO TB24
200 MG | Freq: Every evening | ORAL | Status: DC
Start: 2016-02-15 — End: 2016-02-16
  Administered 2016-02-15 – 2016-02-16 (×2): 200 mg via ORAL

## 2016-02-15 MED ORDER — TRAZODONE HCL 100 MG PO TABS
100 MG | Freq: Every evening | ORAL | Status: DC
Start: 2016-02-15 — End: 2016-02-16
  Administered 2016-02-16: 02:00:00 100 mg via ORAL

## 2016-02-15 MED ORDER — GLUCOSE 40 % PO GEL
40 % | ORAL | Status: DC | PRN
Start: 2016-02-15 — End: 2016-02-21

## 2016-02-15 MED ORDER — ALBUTEROL SULFATE HFA 108 (90 BASE) MCG/ACT IN AERS
108 (90 Base) MCG/ACT | RESPIRATORY_TRACT | Status: DC | PRN
Start: 2016-02-15 — End: 2016-02-21
  Administered 2016-02-16: 14:00:00 2 via RESPIRATORY_TRACT

## 2016-02-15 MED ORDER — LAMOTRIGINE 25 MG PO TABS
25 MG | Freq: Two times a day (BID) | ORAL | Status: DC
Start: 2016-02-15 — End: 2016-02-16
  Administered 2016-02-15 – 2016-02-16 (×3): 25 mg via ORAL

## 2016-02-15 MED ORDER — CARBIDOPA-LEVODOPA 10-100 MG PO TABS
10-100 MG | Freq: Every evening | ORAL | Status: DC
Start: 2016-02-15 — End: 2016-02-14
  Administered 2016-02-15: 02:00:00 1 via ORAL

## 2016-02-15 MED ORDER — MECLIZINE HCL 25 MG PO TABS
25 MG | Freq: Three times a day (TID) | ORAL | Status: DC | PRN
Start: 2016-02-15 — End: 2016-02-14

## 2016-02-15 MED FILL — CARBIDOPA-LEVODOPA 10-100 MG PO TABS: 10-100 MG | ORAL | Qty: 1

## 2016-02-15 MED FILL — FAMOTIDINE 20 MG PO TABS: 20 MG | ORAL | Qty: 1

## 2016-02-15 MED FILL — LAMOTRIGINE 25 MG PO TABS: 25 MG | ORAL | Qty: 1

## 2016-02-15 MED FILL — MONTELUKAST SODIUM 10 MG PO TABS: 10 MG | ORAL | Qty: 1

## 2016-02-15 MED FILL — VENTOLIN HFA 108 (90 BASE) MCG/ACT IN AERS: 108 (90 Base) MCG/ACT | RESPIRATORY_TRACT | Qty: 8

## 2016-02-15 MED FILL — LANTUS 100 UNIT/ML SC SOLN: 100 UNIT/ML | SUBCUTANEOUS | Qty: 0.15

## 2016-02-15 MED FILL — OXCARBAZEPINE 150 MG PO TABS: 150 MG | ORAL | Qty: 1

## 2016-02-15 MED FILL — AMITIZA 8 MCG PO CAPS: 8 MCG | ORAL | Qty: 3

## 2016-02-15 MED FILL — MECLIZINE HCL 25 MG PO TABS: 25 MG | ORAL | Qty: 1

## 2016-02-15 MED FILL — DIAZEPAM 5 MG PO TABS: 5 MG | ORAL | Qty: 1

## 2016-02-15 MED FILL — HUMALOG 100 UNIT/ML SC SOLN: 100 UNIT/ML | SUBCUTANEOUS | Qty: 0.01

## 2016-02-15 MED FILL — SEROQUEL XR 50 MG PO TB24: 50 MG | ORAL | Qty: 4

## 2016-02-15 MED FILL — TRAZODONE HCL 100 MG PO TABS: 100 MG | ORAL | Qty: 3

## 2016-02-15 MED FILL — BUPROPION HCL ER (SR) 150 MG PO TB12: 150 MG | ORAL | Qty: 1

## 2016-02-15 MED FILL — VITAMIN D (ERGOCALCIFEROL) 1.25 MG (50000 UT) PO CAPS: 1.25 MG (50000 UT) | ORAL | Qty: 1

## 2016-02-15 NOTE — Progress Notes (Signed)
Pt has been resting in bed with eyes closed with no obvious s/s of distress voiced or noted, no c/o pain. Will continue to monitor.

## 2016-02-15 NOTE — Progress Notes (Signed)
CSW attempted to contact pt's husband (social hx/dc planning release signed), John at number provided; [phone], to obtain collateral information and discuss discharge planning. No answer, unable to leave voicemail.

## 2016-02-15 NOTE — Progress Notes (Signed)
SW met with treatment team to discuss pt's progress and setbacks.  SW 2 was present.  Pt reportedly slept with medications last night, appetite is good, attended some scheduled group activities yesterday, isolative, stays in her room most of the day, poor ADL's, compliant with medications, reports moderate depression, denies anxiety, denies SI/HI/AVH, continue current treatment plan.

## 2016-02-15 NOTE — Progress Notes (Signed)
ALERT AND ORIENTED X 4. DENIES SI, HI AND AVH. RATES DEPRESSION 6 AND ANXIETY AS 1. STATES SLEPT WELL.

## 2016-02-15 NOTE — Plan of Care (Signed)
Problem: Health Behavior:  Goal: Ability to verbalize adaptive coping strategies to use when suicidal feelings occur will improve  Ability to verbalize adaptive coping strategies to use when suicidal feelings occur will improve   Outcome: Ongoing    Goal: Ability to verbalize adaptive coping strategies to use when the urge to self-mutilate occurs will improve  Ability to verbalize adaptive coping strategies to use when the urge to self-mutilate occurs will improve   Outcome: Ongoing      Problem: Safety:  Goal: Ability to contract for his/her safety will improve  Ability to contract for his/her safety will improve   Outcome: Met This Shift      Problem: Falls - Risk of:  Goal: Will remain free from falls  Will remain free from falls   Outcome: Met This Shift    Goal: Absence of physical injury  Absence of physical injury   Outcome: Met This Shift      Problem: Altered Mood, Depressive Behavior  Goal: LTG-Able to verbalize acceptance of life and situations over which he or she has no control  Outcome: Ongoing    Goal: LTG-Able to verbalize and/or display a decrease in depressive symptoms  Outcome: Ongoing    Goal: STG-Able to verbalize suicidal ideations  Outcome: Met This Shift    Goal: STG-Able to verbalize support system  Outcome: Ongoing    Goal: LTG-Absence of self-harm  Outcome: Met This Shift    Goal: STG-Knowledge of positive coping patterns  Outcome: Ongoing    Goal: Participation in care planning  Outcome: Ongoing      Problem: Suicide risk  Goal: Provide patient with safe environment  Provide patient with safe environment   Outcome: Met This Shift

## 2016-02-15 NOTE — Plan of Care (Signed)
Problem: Altered Mood, Depressive Behavior  Goal: STG-Knowledge of positive coping patterns  Outcome: Ongoing                                                                      Group Therapy Note    Date: 02/15/2016  Start Time: 1330  End Time:  1500  Number of Participants: 3    Type of Group: Psychoeducation    Wellness Binder Information  Module Name:  Women's Issues  Session Number:  2    Patient's Goal:  To improve knowledge of strategies to improve self-esteem    Notes:  Pt participated in group discussion, identified strategies to improve self-esteem.    Status After Intervention:  Improved    Participation Level: Interactive    Participation Quality: Attentive      Speech:  normal      Thought Process/Content: Logical      Affective Functioning: Congruent      Mood: anxious and depressed      Level of consciousness:  Alert and Oriented x4      Response to Learning: Able to verbalize current knowledge/experience, Able to verbalize/acknowledge new learning and Progressing to goal      Endings: None Reported    Modes of Intervention: Education      Discipline Responsible: Psychoeducational Specialist      Signature:  Clydell Hakim
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
## 2016-02-15 NOTE — Plan of Care (Signed)
Problem: Health Behavior:  Goal: Ability to verbalize adaptive coping strategies to use when suicidal feelings occur will improve  Ability to verbalize adaptive coping strategies to use when suicidal feelings occur will improve   Outcome: Ongoing    Goal: Ability to verbalize adaptive coping strategies to use when the urge to self-mutilate occurs will improve  Ability to verbalize adaptive coping strategies to use when the urge to self-mutilate occurs will improve   Outcome: Ongoing      Problem: Safety:  Goal: Ability to contract for his/her safety will improve  Ability to contract for his/her safety will improve   Outcome: Ongoing      Problem: Falls - Risk of:  Goal: Will remain free from falls  Will remain free from falls   Outcome: Met This Shift    Goal: Absence of physical injury  Absence of physical injury   Outcome: Met This Shift      Problem: Altered Mood, Depressive Behavior  Goal: LTG-Able to verbalize acceptance of life and situations over which he or she has no control  Outcome: Ongoing    Goal: LTG-Able to verbalize and/or display a decrease in depressive symptoms  Outcome: Ongoing    Goal: STG-Able to verbalize suicidal ideations  Outcome: Met This Shift    Goal: STG-Able to verbalize support system  Outcome: Met This Shift    Goal: LTG-Absence of self-harm  Outcome: Met This Shift    Goal: STG-Knowledge of positive coping patterns  Outcome: Ongoing    Goal: Participation in care planning  Outcome: Ongoing

## 2016-02-15 NOTE — Behavioral Health Treatment Team (Signed)
[facility] Daily Shift Assessment  Nursing Progress Note    Room: 0617/617-01 Name: Joyce Cohen Age: 60 y.o.    Gender: female   Dx: <principal problem not specified>  Precautions: suicide risk and fall risk  Target Symptoms:    Accu-Chek: Yes   Sleep: Yes,Sleep Quality Fair SI Denies AVH Denies HI Negative for homicidal ideation    Hours Slept: 5 PRN Sleep Meds: No Other PRN Meds: No Med Compliant: Yes Appetite: improved Percent Meals: 50% Social: Yes ADLs: Yes Speech: normal Depression: 6 Anxiety: 6   Participation Teacher, adult educationLevelActive Listener and Interactive  Visitation: Yes  Participation QualityAppropriate and Attentive    Complaints:    Notes:     Signature: Dewaine OatsSandra Christophr Calix LPN  2

## 2016-02-15 NOTE — Progress Notes (Signed)
Progress Note  Joyce Cohen  02/15/2016 1:07 PM  Subjective:   Admit Date:   02/13/2016      CC/ADMIT DX:       Interval History:   Reviewed overnight events and nursing notes.  No new physical complaints.        I have reviewed all labs/diagnostics from the last 24hrs.       ROS:   I have done a 10 point ROS and all are negative, except what is mentioned in the HPI.    DIET CARB CONTROL; Carb Control: 4 carbs/meal (approximate 1800 kcals/day)    Medications:     ??? dextrose       ??? buPROPion  150 mg Oral Daily   ??? vitamin D  50,000 Units Oral Weekly   ??? famotidine  20 mg Oral BID   ??? insulin glargine  15 Units Subcutaneous Daily   ??? lubiprostone  24 mcg Oral BID WC   ??? montelukast  10 mg Oral Nightly   ??? Ospemifene  1 tablet Oral Daily   ??? QUEtiapine  200 mg Oral Nightly   ??? insulin lispro  0-6 Units Subcutaneous TID WC   ??? insulin lispro  0-3 Units Subcutaneous Nightly   ??? diazepam  5 mg Oral BID   ??? traZODone  100 mg Oral Nightly   ??? lamoTRIgine  25 mg Oral BID           Objective:   Vitals: BP 95/69    Pulse 81    Temp 96.9 ??F (36.1 ??C) (Temporal)    Resp 18    Ht 5\' 5"  (1.651 m)    Wt 208 lb (94.3 kg)    SpO2 91%    BMI 34.61 kg/m??  No intake or output data in the 24 hours ending 02/15/16 1307  General appearance: alert and cooperative with exam  Lungs: clear to auscultation bilaterally  Heart: RRR  Abdomen: soft, non-tender; bowel sounds normal; no masses,  no organomegaly  Extremities: extremities normal, atraumatic, no cyanosis or edema  Neurologic:  No obvious focal neurologic deficits.    Assessment and Plan:   Active Problems:    Suicidal ideation    Self-mutilation    DM2    Plan:  1.  Continue present medication(s)   2.  Monitor glucose   3.  Follow with Psych      Discharge planning:   her home     Reviewed treatment plans with the patient and/or family.             Electronically signed by Danny LawlessAribbe A Haniya Fern, MD on 02/15/2016 at 1:07 PM

## 2016-02-16 LAB — POCT GLUCOSE
POC Glucose: 176 mg/dl — ABNORMAL HIGH (ref 70–99)
POC Glucose: 135 mg/dl — ABNORMAL HIGH (ref 70–99)
POC Glucose: 164 mg/dl — ABNORMAL HIGH (ref 70–99)
POC Glucose: 207 mg/dl — ABNORMAL HIGH (ref 70–99)

## 2016-02-16 MED FILL — DIAZEPAM 5 MG PO TABS: 5 MG | ORAL | Qty: 1

## 2016-02-16 MED FILL — TRAZODONE HCL 100 MG PO TABS: 100 MG | ORAL | Qty: 1

## 2016-02-16 MED FILL — LAMOTRIGINE 25 MG PO TABS: 25 MG | ORAL | Qty: 1

## 2016-02-16 MED FILL — BUPROPION HCL ER (SR) 150 MG PO TB12: 150 MG | ORAL | Qty: 1

## 2016-02-16 MED FILL — SEROQUEL XR 50 MG PO TB24: 50 MG | ORAL | Qty: 4

## 2016-02-16 MED FILL — FAMOTIDINE 20 MG PO TABS: 20 MG | ORAL | Qty: 1

## 2016-02-16 MED FILL — MONTELUKAST SODIUM 10 MG PO TABS: 10 MG | ORAL | Qty: 1

## 2016-02-16 MED FILL — LANTUS 100 UNIT/ML SC SOLN: 100 UNIT/ML | SUBCUTANEOUS | Qty: 0.15

## 2016-02-16 MED FILL — AMITIZA 8 MCG PO CAPS: 8 MCG | ORAL | Qty: 3

## 2016-02-16 NOTE — Plan of Care (Signed)
Problem: Altered Mood, Depressive Behavior  Goal: STG-Knowledge of positive coping patterns  Outcome: Ongoing                                                                      Group Therapy Note    Date: 02/16/2016  Start Time: 1000  End Time:  1100  Number of Participants: 6    Type of Group: Psychoeducation    Wellness Binder Information  Module Name:  Emotional Wellness  Session Number:  5    Patient's Goal:  To raise awareness of the effect of unhelpful thought patterns    Notes:  Pt participated in group discussion, identified types of unhelpful thought patterns, discussed the effect on self-esteem and stress level.    Status After Intervention:  Improved    Participation Level: Interactive    Participation Quality: Attentive      Speech:  normal      Thought Process/Content: Logical      Affective Functioning: Congruent      Mood: anxious and depressed      Level of consciousness:  Alert and Oriented x4      Response to Learning: Able to verbalize current knowledge/experience, Able to verbalize/acknowledge new learning and Progressing to goal      Endings: None Reported    Modes of Intervention: Education      Discipline Responsible: Psychoeducational Specialist      Signature:  Clydell Hakim

## 2016-02-16 NOTE — Behavioral Health Treatment Team (Signed)
BHI Daily Shift Assessment  Nursing Progress Note    Room: 0617/617-01 Name: Joyce Cohen Age: 60 y.o.    Gender: female   Dx: <principal problem not specified>SI   Precautions: suicide risk and fall risk  Target Symptoms:    Accu-Chek: No  Sleep: Yes,Sleep Quality Poor SI no plan AVH denies HI Negative for homicidal ideation    Hours Slept: 8 PRN Sleep Meds: Yes Other PRN Meds: No Med Compliant: No Appetite: good Percent Meals: 75% Social: No ADLs: Yes Speech: normal Depression: 6 Anxiety: 1   Participation Teacher, adult education and Interactive  Visitation: No  Participation QualityAppropriate, Attentive and Sharing    Complaints: pt denies any complaints at this time will continue to monitor     Notes:     Signature: Worthy Rancher, RN

## 2016-02-16 NOTE — Progress Notes (Signed)
BHI Daily Shift Assessment  Nursing Progress Note    Room: 0617/617-01 Name: Lyanne CoBreandan A Melhorn Age: 60 y.o.   Ethnicity: Caucasian Gender: female   Dx: <principal problem not specified>  Precautions: suicide risk and fall risk  CPAP: No Accu-Chek: Yes 207  MSE:  Status and Exam  Normal: No  Facial Expression: Worried  Affect: Congruent  Level of Consciousness: Alert  Mood:Normal: No  Mood: Depressed  Motor Activity:Normal: No  Motor Activity: Decreased  Interview Behavior: Cooperative  Preception: Orient to Person, Orient to Time, Orient to Place, Orient to Situation  Attention:Normal: No  Attention: Distractible  Thought Processes: Circumstantial  Thought Content:Normal: No  Thought Content: Preoccupations  Hallucinations: None  Delusions: No  Memory:Normal: No  Memory: Poor Recent  Insight and Judgment: No  Insight and Judgment: Poor Insight  Present Suicidal Ideation: No  Present Homicidal Ideation: No  Sleep: Yes, Fair, has restless sleep Hours Slept: 7 Sched Sleep Meds: Yes PRN Sleep Meds: No Other PRN Meds: No Med Compliant: Yes Appetite: good Percent Meals: 100% Social: Yes ADLs: Yes and minimal this shift Speech: normal Depression: 3 Anxiety: 1, pt had visitor of husband, meeting with husband and Dr Ivor MessierMcGaffee at 1630, pt affect appropriate, behavior is calm and cooperative, pt went to groups, social and med complaint, will continue to monitor for safety. Pt stated that when she was watching game show that she knew the answers but verbally say them. Pt stated that she had a better day today, pt smiling more this shift. Will continue to monitor for safety    Hortencia ConradiLora A Martena Emanuele, RN

## 2016-02-16 NOTE — Plan of Care (Signed)
Problem: Altered Mood, Depressive Behavior  Goal: STG-Knowledge of positive coping patterns  Outcome: Ongoing                                                                      Group Therapy Note    Date: 02/16/2016  Start Time: 1100  End Time:  1200  Number of Participants: 5    Type of Group: Geophysicist/field seismologist Information  Module Name:  Emotional Wellness  Session Number:  5    Patient's Goal:  To improve knowledge of strategies to change negative thinking to positive.    Notes:  Pt participated in group discussion, identified strategies to change negative thought patterns to positive.    Status After Intervention:  Improved    Participation Level: Interactive    Participation Quality: Attentive      Speech:  normal      Thought Process/Content: Logical      Affective Functioning: Congruent      Mood: anxious and depressed      Level of consciousness:  Alert and Oriented x4      Response to Learning: Able to verbalize current knowledge/experience, Able to verbalize/acknowledge new learning and Progressing to goal      Endings: None Reported    Modes of Intervention: Education      Discipline Responsible: Psychoeducational Specialist      Signature:  Clydell Hakim

## 2016-02-16 NOTE — Progress Notes (Signed)
Pt up around midnight for a blanket c/o bieng cold in her room. Pt has rested the rest of the night in bed with eyes closed with no obvious s/s of distress or pain voiced or noted. Will continue to monitor for changes.

## 2016-02-16 NOTE — Plan of Care (Signed)
Problem: Health Behavior:  Goal: Ability to verbalize adaptive coping strategies to use when suicidal feelings occur will improve  Ability to verbalize adaptive coping strategies to use when suicidal feelings occur will improve   Outcome: Ongoing    Goal: Ability to verbalize adaptive coping strategies to use when the urge to self-mutilate occurs will improve  Ability to verbalize adaptive coping strategies to use when the urge to self-mutilate occurs will improve   Outcome: Ongoing      Problem: Safety:  Goal: Ability to contract for his/her safety will improve  Ability to contract for his/her safety will improve   Outcome: Ongoing      Problem: Falls - Risk of:  Goal: Will remain free from falls  Will remain free from falls   Outcome: Ongoing    Goal: Absence of physical injury  Absence of physical injury   Outcome: Ongoing      Problem: Altered Mood, Depressive Behavior  Goal: LTG-Able to verbalize acceptance of life and situations over which he or she has no control  Outcome: Ongoing    Goal: LTG-Able to verbalize and/or display a decrease in depressive symptoms  Outcome: Ongoing    Goal: STG-Able to verbalize suicidal ideations  Outcome: Ongoing    Goal: STG-Able to verbalize support system  Outcome: Ongoing    Goal: LTG-Absence of self-harm  Outcome: Ongoing    Goal: STG-Knowledge of positive coping patterns  Outcome: Ongoing    Goal: Participation in care planning  Outcome: Ongoing      Problem: Suicide risk  Goal: Provide patient with safe environment  Provide patient with safe environment   Outcome: Ongoing

## 2016-02-16 NOTE — Progress Notes (Signed)
SW met with treatment team to discuss pt's progress and setbacks.  SW 2 was present.  Pt reportedly slept, with medication, last night, appetite has improved, attended scheduled group activities yesterday, social with peer/staff, performed ADL's, compliant with medications, reports moderate depression/anxiety, denies SI/HI/AVH, medications are being adjusted, family conference is scheduled today to address concerns, continue current treatment plan.

## 2016-02-16 NOTE — Plan of Care (Signed)
Problem: Health Behavior:  Goal: Ability to verbalize adaptive coping strategies to use when suicidal feelings occur will improve  Ability to verbalize adaptive coping strategies to use when suicidal feelings occur will improve   Outcome: Ongoing    Goal: Ability to verbalize adaptive coping strategies to use when the urge to self-mutilate occurs will improve  Ability to verbalize adaptive coping strategies to use when the urge to self-mutilate occurs will improve   Outcome: Ongoing      Problem: Falls - Risk of:  Goal: Will remain free from falls  Will remain free from falls   Outcome: Ongoing    Goal: Absence of physical injury  Absence of physical injury   Outcome: Ongoing      Problem: Altered Mood, Depressive Behavior  Goal: LTG-Able to verbalize acceptance of life and situations over which he or she has no control  Outcome: Ongoing    Goal: LTG-Able to verbalize and/or display a decrease in depressive symptoms  Outcome: Ongoing    Goal: STG-Able to verbalize suicidal ideations  Outcome: Ongoing    Goal: STG-Able to verbalize support system  Outcome: Ongoing    Goal: STG-Knowledge of positive coping patterns  Outcome: Ongoing    Goal: Participation in care planning  Outcome: Ongoing      Problem: Suicide risk  Goal: Provide patient with safe environment  Provide patient with safe environment   Outcome: Met This Shift

## 2016-02-16 NOTE — Plan of Care (Signed)
Problem: Altered Mood, Depressive Behavior  Goal: STG-Knowledge of positive coping patterns  Outcome: Ongoing                                                                      Group Therapy Note    Date: 02/16/2016  Start Time: 1430  End Time:  1530  Number of Participants: 5    Type of Group: Recovery    Wellness Binder Information  Module Name:  Emotional Wellness  Session Number:  5    Patient's Goal:  To improve positive thinking    Notes:  Pt participated in group exercise, practiced thought stopping and rational turnarounds to improve positive thinking.    Status After Intervention:  Improved    Participation Level: Interactive    Participation Quality: Attentive      Speech:  normal      Thought Process/Content: Logical      Affective Functioning: Congruent      Mood: anxious and depressed      Level of consciousness:  Alert and Oriented x4      Response to Learning: Able to verbalize current knowledge/experience, Able to verbalize/acknowledge new learning and Progressing to goal      Endings: None Reported    Modes of Intervention: Education      Discipline Responsible: Psychoeducational Specialist      Signature:  Clydell Hakim

## 2016-02-17 LAB — POCT GLUCOSE
POC Glucose: 213 mg/dl — ABNORMAL HIGH (ref 70–99)
POC Glucose: 137 mg/dl — ABNORMAL HIGH (ref 70–99)
POC Glucose: 235 mg/dl — ABNORMAL HIGH (ref 70–99)
POC Glucose: 190 mg/dl — ABNORMAL HIGH (ref 70–99)

## 2016-02-17 MED ORDER — OLANZAPINE 10 MG PO TABS
10 MG | Freq: Every evening | ORAL | Status: DC
Start: 2016-02-17 — End: 2016-02-21
  Administered 2016-02-17 – 2016-02-22 (×6): 10 mg via ORAL

## 2016-02-17 MED ORDER — DIAZEPAM 2 MG PO TABS
2 MG | Freq: Three times a day (TID) | ORAL | Status: DC
Start: 2016-02-17 — End: 2016-02-21
  Administered 2016-02-17 – 2016-02-21 (×14): 2 mg via ORAL

## 2016-02-17 MED ORDER — TRAZODONE HCL 50 MG PO TABS
50 MG | Freq: Every evening | ORAL | Status: DC | PRN
Start: 2016-02-17 — End: 2016-02-21
  Administered 2016-02-19 – 2016-02-21 (×2): 50 mg via ORAL

## 2016-02-17 MED ORDER — LAMOTRIGINE 100 MG PO TABS
100 MG | Freq: Every evening | ORAL | Status: DC
Start: 2016-02-17 — End: 2016-02-21
  Administered 2016-02-18 – 2016-02-22 (×5): 100 mg via ORAL

## 2016-02-17 MED FILL — AMITIZA 8 MCG PO CAPS: 8 MCG | ORAL | Qty: 3

## 2016-02-17 MED FILL — DIAZEPAM 5 MG PO TABS: 5 MG | ORAL | Qty: 1

## 2016-02-17 MED FILL — FAMOTIDINE 20 MG PO TABS: 20 MG | ORAL | Qty: 1

## 2016-02-17 MED FILL — DIAZEPAM 2 MG PO TABS: 2 MG | ORAL | Qty: 1

## 2016-02-17 MED FILL — MONTELUKAST SODIUM 10 MG PO TABS: 10 MG | ORAL | Qty: 1

## 2016-02-17 MED FILL — OLANZAPINE 10 MG PO TABS: 10 MG | ORAL | Qty: 1

## 2016-02-17 MED FILL — BUPROPION HCL ER (SR) 150 MG PO TB12: 150 MG | ORAL | Qty: 1

## 2016-02-17 MED FILL — LAMOTRIGINE 25 MG PO TABS: 25 MG | ORAL | Qty: 1

## 2016-02-17 MED FILL — LANTUS 100 UNIT/ML SC SOLN: 100 UNIT/ML | SUBCUTANEOUS | Qty: 0.15

## 2016-02-17 MED FILL — SEROQUEL XR 200 MG PO TB24: 200 MG | ORAL | Qty: 1

## 2016-02-17 MED FILL — TRAZODONE HCL 100 MG PO TABS: 100 MG | ORAL | Qty: 1

## 2016-02-17 NOTE — Plan of Care (Signed)
Problem: Health Behavior:  Goal: Ability to verbalize adaptive coping strategies to use when suicidal feelings occur will improve  Ability to verbalize adaptive coping strategies to use when suicidal feelings occur will improve   Outcome: Ongoing    Goal: Ability to verbalize adaptive coping strategies to use when the urge to self-mutilate occurs will improve  Ability to verbalize adaptive coping strategies to use when the urge to self-mutilate occurs will improve   Outcome: Ongoing      Problem: Safety:  Goal: Ability to contract for his/her safety will improve  Ability to contract for his/her safety will improve   Outcome: Ongoing      Problem: Falls - Risk of:  Goal: Will remain free from falls  Will remain free from falls   Outcome: Ongoing    Goal: Absence of physical injury  Absence of physical injury   Outcome: Ongoing      Problem: Altered Mood, Depressive Behavior  Goal: LTG-Able to verbalize acceptance of life and situations over which he or she has no control  Outcome: Ongoing    Goal: LTG-Able to verbalize and/or display a decrease in depressive symptoms  Outcome: Ongoing    Goal: STG-Able to verbalize suicidal ideations  Outcome: Ongoing    Goal: STG-Able to verbalize support system  Outcome: Ongoing    Goal: LTG-Absence of self-harm  Outcome: Ongoing    Goal: STG-Knowledge of positive coping patterns  Outcome: Ongoing    Goal: Participation in care planning  Outcome: Ongoing

## 2016-02-17 NOTE — Plan of Care (Signed)
Problem: Altered Mood, Depressive Behavior  Goal: STG-Able to verbalize support system  Outcome: Ongoing         Group Therapy Note    Date: 02/17/2016  Start Time: 1330   End Time:  1415  Number of Participants: 6    Type of Group: Cognitive Skills    Wellness Binder Information  Module Name:  Stress  Session Number:  3.    Patient's Goal:  Preventing Stress    Notes:  Pt shared their biggest stressor currently and symptoms they experience when stressed. Pt learned that social support, emotional management skills, and attending to their basic needs can help reduce level of stress. Pt shared who their support consists of and ways they can successfully handle their unpleasant emotions in the future.     Status After Intervention:  Improved    Participation Level: Active Listener and Interactive    Participation Quality: Appropriate and Attentive      Speech:  normal      Thought Process/Content: Logical      Affective Functioning: Congruent      Mood: depressed      Level of consciousness:  Alert, Oriented x4 and Attentive      Response to Learning: Able to verbalize current knowledge/experience, Able to retain information and Progressing to goal      Endings: None Reported    Modes of Intervention: Support and Exploration      Discipline Responsible: Psychoeducational Specialist      Signature:  Barbie Banner

## 2016-02-17 NOTE — Plan of Care (Signed)
Problem: Health Behavior:  Goal: Ability to verbalize adaptive coping strategies to use when suicidal feelings occur will improve  Ability to verbalize adaptive coping strategies to use when suicidal feelings occur will improve   Outcome: Ongoing    Goal: Ability to verbalize adaptive coping strategies to use when the urge to self-mutilate occurs will improve  Ability to verbalize adaptive coping strategies to use when the urge to self-mutilate occurs will improve   Outcome: Met This Shift      Problem: Safety:  Goal: Ability to contract for his/her safety will improve  Ability to contract for his/her safety will improve   Outcome: Met This Shift      Problem: Falls - Risk of:  Goal: Will remain free from falls  Will remain free from falls   Outcome: Met This Shift    Goal: Absence of physical injury  Absence of physical injury   Outcome: Met This Shift      Problem: Altered Mood, Depressive Behavior  Goal: LTG-Able to verbalize acceptance of life and situations over which he or she has no control  Outcome: Ongoing    Goal: LTG-Able to verbalize and/or display a decrease in depressive symptoms  Outcome: Ongoing    Goal: STG-Able to verbalize suicidal ideations  Outcome: Met This Shift    Goal: STG-Able to verbalize support system  Outcome: Ongoing    Goal: LTG-Absence of self-harm  Outcome: Met This Shift    Goal: STG-Knowledge of positive coping patterns  Outcome: Ongoing    Goal: Participation in care planning  Outcome: Ongoing      Problem: Suicide risk  Goal: Provide patient with safe environment  Provide patient with safe environment   Outcome: Met This Shift

## 2016-02-17 NOTE — Progress Notes (Signed)
Patient has been in bed with eyes closed.

## 2016-02-17 NOTE — Progress Notes (Signed)
Pt c/o of sciatica pain in back and leg, stating hurting since yesterday, pt  Limping and slow walking, contacted Algernon HuxleyGreg [place] at 2008, notified of pt condition, Tammy SoursGreg consulted r/t allergies, and  new order for Tylenol 500 mg 2 PO every 4 hours for pain. Entered order

## 2016-02-17 NOTE — Behavioral Health Treatment Team (Signed)
BHI Daily Shift Assessment  Nursing Progress Note    Room: 0617/617-01 Name: Joyce Cohen Age: 60 y.o.    Gender: female   Dx: <principal problem not specified>  Precautions: suicide risk and fall risk  Target Symptoms:    Accu-Chek: Yes   Sleep: Yes,Sleep Quality Fair SI DenirsAVH Denies HI Negative for homicidal ideation    Hours Slept: 5 PRN Sleep Meds: Yes Other PRN Meds: Yes Med Compliant: Yes Appetite: good Percent Meals: 75% Social: yes ADLs: Yes Speech: normal Depression: 3 Anxiety: 1   Participation Teacher, adult educationLevelActive Listener  Visitation: Yes  Participation QualityAppropriate and Attentive    Complaints:    Notes: Patient complains of pain in her leg,hip on the right side.    Signature: Dewaine OatsSandra Marios Gaiser LPN

## 2016-02-17 NOTE — Behavioral Health Treatment Team (Signed)
BHI Daily Shift Assessment  Nursing Progress Note    Room: 0617/617-01 Name: Joyce Cohen Age: 60 y.o.    Gender: female   Dx: <principal problem not specified>  Precautions: suicide risk and fall risk  Target Symptoms:    Accu-Chek: Yes 137  Sleep: Yes,Sleep Quality Fair SI no plan AVH denies HI Negative for homicidal ideation    Hours Slept: 8 PRN Sleep Meds: Yes Other PRN Meds: No Med Compliant: Yes Appetite: good Percent Meals: 100% Social: Yes ADLs: Yes shower todaySpeech: normal Depression: 5 Anxiety: 1   Participation Teacher, adult education  Visitation: No  Participation QualityAttentive    Complaints: pt denies any complaints at this time will continue to monitor     Notes:     Signature: Worthy Rancher, RN

## 2016-02-18 LAB — POCT GLUCOSE
POC Glucose: 161 mg/dl — ABNORMAL HIGH (ref 70–99)
POC Glucose: 136 mg/dl — ABNORMAL HIGH (ref 70–99)
POC Glucose: 260 mg/dl — ABNORMAL HIGH (ref 70–99)
POC Glucose: 197 mg/dl — ABNORMAL HIGH (ref 70–99)

## 2016-02-18 MED ORDER — ACETAMINOPHEN 500 MG PO TABS
500 MG | ORAL | Status: DC | PRN
Start: 2016-02-18 — End: 2016-02-21
  Administered 2016-02-18 – 2016-02-20 (×3): 500 mg via ORAL

## 2016-02-18 MED ORDER — HYDROCODONE-ACETAMINOPHEN 5-325 MG PO TABS
5-325 MG | Freq: Four times a day (QID) | ORAL | Status: DC | PRN
Start: 2016-02-18 — End: 2016-02-21
  Administered 2016-02-19 – 2016-02-21 (×2): 1 via ORAL

## 2016-02-18 MED FILL — AMITIZA 24 MCG PO CAPS: 24 MCG | ORAL | Qty: 1

## 2016-02-18 MED FILL — OLANZAPINE 10 MG PO TABS: 10 MG | ORAL | Qty: 1

## 2016-02-18 MED FILL — DIAZEPAM 2 MG PO TABS: 2 MG | ORAL | Qty: 1

## 2016-02-18 MED FILL — FAMOTIDINE 20 MG PO TABS: 20 MG | ORAL | Qty: 1

## 2016-02-18 MED FILL — TYLENOL EXTRA STRENGTH 500 MG PO TABS: 500 MG | ORAL | Qty: 1

## 2016-02-18 MED FILL — BUPROPION HCL ER (SR) 150 MG PO TB12: 150 MG | ORAL | Qty: 1

## 2016-02-18 MED FILL — LAMOTRIGINE 100 MG PO TABS: 100 MG | ORAL | Qty: 1

## 2016-02-18 MED FILL — MONTELUKAST SODIUM 10 MG PO TABS: 10 MG | ORAL | Qty: 1

## 2016-02-18 MED FILL — LANTUS 100 UNIT/ML SC SOLN: 100 UNIT/ML | SUBCUTANEOUS | Qty: 0.15

## 2016-02-18 NOTE — Progress Notes (Signed)
02/18/2016 10:07 AM   Progress Note        Joyce Cohen [birth year]  Psychotherapy Time Spent: 15 min      Psychotherapy Topics: health    Chief Complaint   Patient presents with   . Manic Behavior   . Suicidal       Subjective:  Patient seen today. Tells me she feels better. No suicidal ideas intentions or plans. Rates depression 3 and anxiety 3. Complaining about pain in her right leg.    Patient reports side effects as follows: none.  Reports no suicidal ideation.  Reports compliance with medications as good .     Review of Systems - Negative except see above    Current Meds:    Prior to Admission medications    Medication Sig Start Date End Date Taking? Authorizing Provider   diclofenac sodium 1 % GEL Apply 2 g topically daily as needed for Pain   Yes Historical Provider, MD   lubiprostone (AMITIZA) 24 MCG capsule Take 24 mcg by mouth 2 times daily (with meals)   Yes Historical Provider, MD   Cholecalciferol (VITAMIN D3) 50000 units CAPS Take 50,000 Units by mouth   Yes Historical Provider, MD   diazepam (VALIUM) 5 MG tablet Take 5 mg by mouth 3 times daily  .   Yes Historical Provider, MD   meclizine (ANTIVERT) 25 MG tablet Take 25 mg by mouth 3 times daily as needed   Yes Historical Provider, MD   traMADol (ULTRAM) 50 MG tablet Take 1 tablet by mouth 2 times daily as needed for Pain . 01/17/16  Yes Beatrix Fetters, MD   QUEtiapine (SEROQUEL XR) 400 MG extended release tablet Take 200 mg by mouth nightly    Yes Historical Provider, MD   zolpidem (AMBIEN) 10 MG tablet Take 1 tablet by mouth nightly as needed for Sleep 10/24/15  Yes Deretha Emory, MD   Ospemifene 60 MG TABS Take 1 tablet by mouth daily 10/17/15  Yes Gaye Alken, MD   acetaminophen (TYLENOL) 325 MG tablet Take 2 tablets by mouth every 4 hours as needed for Pain 10/11/15  Yes Marcelina Morel Ballew, DO   albuterol sulfate HFA (VENTOLIN HFA) 108 (90 Base) MCG/ACT inhaler Inhale 2 puffs into the lungs every 4 hours as needed for Wheezing 10/11/15   Yes Marcelina Morel Ballew, DO   OXcarbazepine (TRILEPTAL) 150 MG tablet Take 1 tablet by mouth 2 times daily  Patient taking differently: Take 300 mg by mouth 2 times daily One in the am and 2 at night time 10/11/15  Yes Jeanene Erb, DO   famotidine (PEPCID) 20 MG tablet Take 1 tablet by mouth 2 times daily 10/11/15  Yes Marcelina Morel Ballew, DO   HUMALOG 100 UNIT/ML injection vial  09/01/15  Yes Historical Provider, MD   Lancets MISC  08/31/15  Yes Historical Provider, MD   NONFORMULARY Arthritis hand ointment   Yes Historical Provider, MD   carbidopa-levodopa (SINEMET) 10-100 MG per tablet Take 1 tablet by mouth nightly Indications: 1 to 2 tablets at bedtime    Yes Historical Provider, MD   montelukast (SINGULAIR) 10 MG tablet Take 10 mg by mouth nightly   Yes Historical Provider, MD   vitamin D (ERGOCALCIFEROL) 50000 UNITS CAPS capsule Take 50,000 Units by mouth once a week  07/13/14  Yes Historical Provider, MD   traZODone (DESYREL) 100 MG tablet Take 1 tablet by mouth nightly as needed for Sleep  Patient taking differently:  Take 300 mg by mouth nightly Indications: 1 to 3 tablets for sleep at night  05/16/14  Yes Maylene Roes, MD   buPROPion SR [facility] SR) 150 MG SR tablet Take 1 tablet by mouth daily 05/16/14  Yes Maylene Roes, MD   lubiprostone (AMITIZA) 24 MCG capsule Take 1 capsule by mouth 2 times daily (with meals) 03/17/14  Yes Percival Spanish, APRN   insulin glargine (LANTUS) 100 UNIT/ML injection vial Inject 15 Units into the skin daily    Historical Provider, MD       @    MSE:  Patient is  A & O x3.  Appearance:  well-appearing  Cognition:  Recent memory intact , remote memory intact , good fund of knowledge, average intelligence level.   Speech:  normal  Language: Naming: intact; Word Finding: intact  Conversation no evidence of delusions  Behavior:  Cooperative  Mood: improved  Affect: congruent with mood and full range  Thought Content: negative delusions, hallucinations, obsessions, homicidal  and suicidal  Thought Process: linear, goal directed and coherent  Judgement Insight:  improved and appropriate  Gait and Station:normal gait and station   Musculoskeletal:    Assesment:   1. Depression with suicidal ideation        Plan:  1. The patient continues to need, on a daily basis, active treatment furnished directly by or requiring the supervision of inpatient psychiatric facility personnel.  2. Same medications for now  3. Supportive therapy offered    Luna Fuse M.D.  @[facility]AN @       @[employer] @

## 2016-02-18 NOTE — Progress Notes (Signed)
Progress Note  Lyanne CoBreandan A South  02/18/2016 11:16 PM  Subjective:   Admit Date:   02/13/2016      CC/ADMIT DX:       Interval History:   Reviewed overnight events and nursing notes.  She has had no new medical concerns.       I have reviewed all labs/diagnostics from the last 24hrs.       ROS:   I have done a 10 point ROS and all are negative, except what is mentioned in the HPI.    DIET CARB CONTROL; Carb Control: 4 carbs/meal (approximate 1800 kcals/day)    Medications:     ??? dextrose       ??? [START ON 02/19/2016] insulin glargine  20 Units Subcutaneous Daily   ??? diazepam  2 mg Oral TID AC   ??? lamoTRIgine  100 mg Oral Nightly   ??? OLANZapine  10 mg Oral Nightly   ??? buPROPion  150 mg Oral Daily   ??? vitamin D  50,000 Units Oral Weekly   ??? famotidine  20 mg Oral BID   ??? lubiprostone  24 mcg Oral BID WC   ??? montelukast  10 mg Oral Nightly   ??? Ospemifene  1 tablet Oral Daily   ??? insulin lispro  0-6 Units Subcutaneous TID WC   ??? insulin lispro  0-3 Units Subcutaneous Nightly           Objective:   Vitals: BP (!) 128/91    Pulse 92    Temp 99.2 ??F (37.3 ??C) (Temporal)    Resp 20    Ht 5\' 5"  (1.651 m)    Wt 208 lb (94.3 kg)    SpO2 93%    BMI 34.61 kg/m??  No intake or output data in the 24 hours ending 02/18/16 2316  General appearance: alert and cooperative with exam  Lungs: clear to auscultation bilaterally  Heart: RRR  Abdomen: soft, non-tender; bowel sounds normal; no masses,  no organomegaly  Extremities: extremities normal, atraumatic, no cyanosis or edema  Neurologic:  No obvious focal neurologic deficits.    Assessment and Plan:   Active Problems:    Suicidal ideation    Self-mutilation    DM2    Plan:  1.  Continue present medication(s)   2.  Increase basal insulin  3.  Follow with Psych      Discharge planning:   her home     Reviewed treatment plans with the patient and/or family.             Electronically signed by Danny LawlessAribbe A Yadiel Aubry, MD on 02/18/2016 at 11:16 PM

## 2016-02-18 NOTE — Progress Notes (Signed)
Patient has been in bed with eyes closed the majority of this shift.

## 2016-02-18 NOTE — Plan of Care (Signed)
Problem: Altered Mood, Depressive Behavior  Goal: LTG-Able to verbalize and/or display a decrease in depressive symptoms  Outcome: Ongoing                                                                      Group Therapy Note    Date: 02/18/2016  Start Time: 1315  End Time:  1400  Number of Participants: 5    Type of Group: Recovery    Wellness Binder Information  Module Name:  Emotional Wellness  Session Number:  1.    Patient's Goal:  Validation of Feelings    Notes:  Pt discussed attempting to think more positively and shared some positive experiences from their past including accomplishments, challenges overcame, lessons learned, and what they are grateful for.     Status After Intervention:  Improved    Participation Level: Active Listener    Participation Quality: Appropriate and Attentive      Speech:  normal      Thought Process/Content: Logical      Affective Functioning: Congruent      Mood: depressed      Level of consciousness:  Alert, Oriented x4 and Attentive      Response to Learning: Able to verbalize current knowledge/experience and Progressing to goal      Endings: None Reported    Modes of Intervention: Support      Discipline Responsible: Psychoeducational Specialist      Signature:  Barbie Banner

## 2016-02-18 NOTE — Plan of Care (Signed)
Problem: Health Behavior:  Goal: Ability to verbalize adaptive coping strategies to use when suicidal feelings occur will improve  Ability to verbalize adaptive coping strategies to use when suicidal feelings occur will improve   Outcome: Ongoing    Goal: Ability to verbalize adaptive coping strategies to use when the urge to self-mutilate occurs will improve  Ability to verbalize adaptive coping strategies to use when the urge to self-mutilate occurs will improve   Outcome: Ongoing      Problem: Falls - Risk of:  Goal: Will remain free from falls  Will remain free from falls   Outcome: Met This Shift    Goal: Absence of physical injury  Absence of physical injury   Outcome: Met This Shift      Problem: Altered Mood, Depressive Behavior  Goal: LTG-Able to verbalize acceptance of life and situations over which he or she has no control  Outcome: Ongoing    Goal: LTG-Able to verbalize and/or display a decrease in depressive symptoms  Outcome: Met This Shift    Goal: STG-Able to verbalize suicidal ideations  Outcome: Met This Shift    Goal: STG-Able to verbalize support system  Outcome: Ongoing    Goal: LTG-Absence of self-harm  Outcome: Met This Shift    Goal: STG-Knowledge of positive coping patterns  Outcome: Ongoing    Goal: Participation in care planning  Outcome: Ongoing      Problem: Suicide risk  Goal: Provide patient with safe environment  Provide patient with safe environment   Outcome: Met This Shift      Problem: Pain:  Goal: Control of chronic pain  Control of chronic pain   Outcome: Ongoing

## 2016-02-18 NOTE — Behavioral Health Treatment Team (Signed)
[facility] Daily Shift Assessment  Nursing Progress Note    Room: 0617/617-01 Name: Joyce Cohen Age: 60 y.o.    Gender: female   Dx: <principal problem not specified>  Precautions: suicide risk and fall risk  Target Symptoms:    Accu-Chek: Yes   Sleep: Yes,Sleep Quality Good SI Denies AVH Denies HI Negative for homicidal ideation    Hours Slept: 6 PRN Sleep Meds: Yes Other PRN Meds: No Med Compliant: Yes Appetite: good Percent Meals: 100% Social: Yes ADLs: Yes Speech: normal Depression: 0 Anxiety: 0   Participation Teacher, adult educationLevelActive Listener and Interactive  Visitation: No  Participation QualityAppropriate, Attentive and Sharing    Complaints:    Notes:     Signature: Dewaine OatsSandra Yuuki Skeens LPN

## 2016-02-18 NOTE — Behavioral Health Treatment Team (Signed)
[facility] Daily Shift Assessment  Nursing Progress Note    Room: 0617/617-01 Name: Joyce Cohen Age: 59 y.o.    Gender: female   Dx: <principal problem not specified>SI   Precautions: suicide risk and fall risk  Target Symptoms:    Accu-Chek: Yes 136  Sleep: Yes,Sleep Quality Very good SI no plan AVH denies HI Negative for homicidal ideation    Hours Slept: 8 PRN Sleep Meds: Yes Zyprexa 5 mg Other PRN Meds: No Med Compliant: Yes Appetite: good Percent Meals: 100% Social: No ADLs: No Speech: normal Depression: 2 Anxiety: 1   Participation LevelActive Listener  Visitation: No  Participation QualityAttentive    Complaints:pt denies any complaints at this time. Will continue to monitor     Notes:     Signature: Worthy Rancher, RN

## 2016-02-18 NOTE — Plan of Care (Signed)
Problem: Health Behavior:  Goal: Ability to verbalize adaptive coping strategies to use when suicidal feelings occur will improve  Ability to verbalize adaptive coping strategies to use when suicidal feelings occur will improve   Outcome: Ongoing    Goal: Ability to verbalize adaptive coping strategies to use when the urge to self-mutilate occurs will improve  Ability to verbalize adaptive coping strategies to use when the urge to self-mutilate occurs will improve   Outcome: Ongoing      Problem: Safety:  Goal: Ability to contract for his/her safety will improve  Ability to contract for his/her safety will improve   Outcome: Ongoing      Problem: Falls - Risk of:  Goal: Will remain free from falls  Will remain free from falls   Outcome: Ongoing    Goal: Absence of physical injury  Absence of physical injury   Outcome: Ongoing      Problem: Altered Mood, Depressive Behavior  Goal: LTG-Able to verbalize acceptance of life and situations over which he or she has no control  Outcome: Ongoing    Goal: LTG-Able to verbalize and/or display a decrease in depressive symptoms  Outcome: Ongoing    Goal: STG-Able to verbalize suicidal ideations  Outcome: Ongoing    Goal: LTG-Absence of self-harm  Outcome: Ongoing    Goal: Participation in care planning  Outcome: Ongoing      Problem: Pain:  Goal: Pain level will decrease  Pain level will decrease   Outcome: Ongoing    Goal: Control of acute pain  Control of acute pain   Outcome: Ongoing    Goal: Control of chronic pain  Control of chronic pain   Outcome: Ongoing

## 2016-02-19 LAB — URINALYSIS
Bilirubin Urine: NEGATIVE
Glucose, Ur: NEGATIVE mg/dL
Ketones, Urine: NEGATIVE mg/dL
Nitrite, Urine: POSITIVE — AB
Protein, UA: NEGATIVE mg/dL
Specific Gravity, UA: 1.007 (ref 1.005–1.030)
Urobilinogen, Urine: 0.2 E.U./dL (ref <2.0)
pH, UA: 6 (ref 5.0–8.0)

## 2016-02-19 LAB — POCT GLUCOSE
POC Glucose: 285 mg/dl — ABNORMAL HIGH (ref 70–99)
POC Glucose: 187 mg/dl — ABNORMAL HIGH (ref 70–99)
POC Glucose: 148 mg/dl — ABNORMAL HIGH (ref 70–99)
POC Glucose: 240 mg/dl — ABNORMAL HIGH (ref 70–99)

## 2016-02-19 LAB — MICROSCOPIC URINALYSIS

## 2016-02-19 MED ORDER — INSULIN GLARGINE 100 UNIT/ML SC SOLN
100 UNIT/ML | Freq: Every day | SUBCUTANEOUS | Status: DC
Start: 2016-02-19 — End: 2016-02-20
  Administered 2016-02-19 – 2016-02-20 (×2): 20 [IU] via SUBCUTANEOUS

## 2016-02-19 MED ORDER — SULFAMETHOXAZOLE-TRIMETHOPRIM 800-160 MG PO TABS
800-160 MG | Freq: Two times a day (BID) | ORAL | Status: DC
Start: 2016-02-19 — End: 2016-02-21
  Administered 2016-02-20 – 2016-02-22 (×5): 1 via ORAL

## 2016-02-19 MED FILL — DIAZEPAM 2 MG PO TABS: 2 MG | ORAL | Qty: 1

## 2016-02-19 MED FILL — FAMOTIDINE 20 MG PO TABS: 20 MG | ORAL | Qty: 1

## 2016-02-19 MED FILL — HYDROCODONE-ACETAMINOPHEN 5-325 MG PO TABS: 5-325 MG | ORAL | Qty: 1

## 2016-02-19 MED FILL — OLANZAPINE 10 MG PO TABS: 10 MG | ORAL | Qty: 1

## 2016-02-19 MED FILL — LANTUS 100 UNIT/ML SC SOLN: 100 UNIT/ML | SUBCUTANEOUS | Qty: 0.15

## 2016-02-19 MED FILL — TRAZODONE HCL 50 MG PO TABS: 50 MG | ORAL | Qty: 1

## 2016-02-19 MED FILL — BUPROPION HCL ER (SR) 150 MG PO TB12: 150 MG | ORAL | Qty: 1

## 2016-02-19 MED FILL — LANTUS 100 UNIT/ML SC SOLN: 100 UNIT/ML | SUBCUTANEOUS | Qty: 0.2

## 2016-02-19 MED FILL — AMITIZA 24 MCG PO CAPS: 24 MCG | ORAL | Qty: 1

## 2016-02-19 MED FILL — LAMOTRIGINE 100 MG PO TABS: 100 MG | ORAL | Qty: 1

## 2016-02-19 MED FILL — MONTELUKAST SODIUM 10 MG PO TABS: 10 MG | ORAL | Qty: 1

## 2016-02-19 MED FILL — TYLENOL EXTRA STRENGTH 500 MG PO TABS: 500 MG | ORAL | Qty: 1

## 2016-02-19 NOTE — Progress Notes (Signed)
Pt calm and cooperative this shift. She has done activities and self care, her affect is brighter, her gait is steady, she reports moderate pain in her lower abdomen. She reports no depression, no anxiety, no SI, HI or AVH.

## 2016-02-19 NOTE — Progress Notes (Signed)
URINE OBTAINED AND SENT TO LAB

## 2016-02-19 NOTE — Progress Notes (Signed)
ALERT AND ORIENTED X 4. DENIES SI, HI AND AVH. RATES DEPRESSION AND ANXIETY AS 1. STATES SLEPT WELL.

## 2016-02-19 NOTE — Progress Notes (Signed)
02/19/2016 11:01 AM   Progress Note        Joyce Cohen 1955-11-28  Psychotherapy Time Spent: 15 min      Psychotherapy Topics: health    Chief Complaint   Patient presents with   . Manic Behavior   . Suicidal       Subjective:  Patient seen today. Doing "very good". Attributes that to "the new mix of medications". Suspect she may be having a UTI.    Patient reports side effects as follows: none.  Reports no suicidal ideation.  Reports compliance with medications as good .     Review of Systems - Negative except see above    Current Meds:    Prior to Admission medications    Medication Sig Start Date End Date Taking? Authorizing Provider   diclofenac sodium 1 % GEL Apply 2 g topically daily as needed for Pain   Yes Historical Provider, MD   lubiprostone (AMITIZA) 24 MCG capsule Take 24 mcg by mouth 2 times daily (with meals)   Yes Historical Provider, MD   Cholecalciferol (VITAMIN D3) 50000 units CAPS Take 50,000 Units by mouth   Yes Historical Provider, MD   diazepam (VALIUM) 5 MG tablet Take 5 mg by mouth 3 times daily  .   Yes Historical Provider, MD   meclizine (ANTIVERT) 25 MG tablet Take 25 mg by mouth 3 times daily as needed   Yes Historical Provider, MD   traMADol (ULTRAM) 50 MG tablet Take 1 tablet by mouth 2 times daily as needed for Pain . 01/17/16  Yes Beatrix Fetters, MD   QUEtiapine (SEROQUEL XR) 400 MG extended release tablet Take 200 mg by mouth nightly    Yes Historical Provider, MD   zolpidem (AMBIEN) 10 MG tablet Take 1 tablet by mouth nightly as needed for Sleep 10/24/15  Yes Deretha Emory, MD   Ospemifene 60 MG TABS Take 1 tablet by mouth daily 10/17/15  Yes Gaye Alken, MD   acetaminophen (TYLENOL) 325 MG tablet Take 2 tablets by mouth every 4 hours as needed for Pain 10/11/15  Yes Marcelina Morel Ballew, DO   albuterol sulfate HFA (VENTOLIN HFA) 108 (90 Base) MCG/ACT inhaler Inhale 2 puffs into the lungs every 4 hours as needed for Wheezing 10/11/15  Yes Marcelina Morel Ballew, DO   OXcarbazepine  (TRILEPTAL) 150 MG tablet Take 1 tablet by mouth 2 times daily  Patient taking differently: Take 300 mg by mouth 2 times daily One in the am and 2 at night time 10/11/15  Yes Jeanene Erb, DO   famotidine (PEPCID) 20 MG tablet Take 1 tablet by mouth 2 times daily 10/11/15  Yes Marcelina Morel Ballew, DO   HUMALOG 100 UNIT/ML injection vial  09/01/15  Yes Historical Provider, MD   Lancets MISC  08/31/15  Yes Historical Provider, MD   NONFORMULARY Arthritis hand ointment   Yes Historical Provider, MD   carbidopa-levodopa (SINEMET) 10-100 MG per tablet Take 1 tablet by mouth nightly Indications: 1 to 2 tablets at bedtime    Yes Historical Provider, MD   montelukast (SINGULAIR) 10 MG tablet Take 10 mg by mouth nightly   Yes Historical Provider, MD   vitamin D (ERGOCALCIFEROL) 50000 UNITS CAPS capsule Take 50,000 Units by mouth once a week  07/13/14  Yes Historical Provider, MD   traZODone (DESYREL) 100 MG tablet Take 1 tablet by mouth nightly as needed for Sleep  Patient taking differently: Take 300 mg by mouth nightly  Indications: 1 to 3 tablets for sleep at night  05/16/14  Yes Maylene Roes, MD   buPROPion SR [facility] SR) 150 MG SR tablet Take 1 tablet by mouth daily 05/16/14  Yes Maylene Roes, MD   lubiprostone (AMITIZA) 24 MCG capsule Take 1 capsule by mouth 2 times daily (with meals) 03/17/14  Yes Percival Spanish, APRN   insulin glargine (LANTUS) 100 UNIT/ML injection vial Inject 15 Units into the skin daily    Historical Provider, MD       @    MSE:  Patient is  A & O x3.  Appearance:  well-appearing  Cognition:  Recent memory intact , remote memory intact , good fund of knowledge, average intelligence level.   Speech:  normal  Language: Naming: intact; Word Finding: intact  Conversation no evidence of delusions  Behavior:  Cooperative  Mood: improved  Affect: congruent with mood and full range  Thought Content: negative delusions, hallucinations, obsessions, homicidal and suicidal  Thought Process: linear,  goal directed and coherent  Judgement Insight:  improved and appropriate  Gait and Station:normal gait and station   Musculoskeletal:    Assesment:   1. Depression with suicidal ideation        Plan:  1. The patient continues to need, on a daily basis, active treatment furnished directly by or requiring the supervision of inpatient psychiatric facility personnel.  2. Order a urinalysis  3. Supportive therapy offered  4. Same medications for now  5. Possible discharge tomorrow    Luna Fuse M.D.  @[facility] @       @[employer] @

## 2016-02-19 NOTE — Progress Notes (Signed)
S:  C/O burning with urination that started yesterday.  Reports frequent UTIs.      O:  A&O x3.  Lungs clear bilaterally.      P;  Will obtain urinalysis and alter plan of care as needed.

## 2016-02-19 NOTE — Plan of Care (Signed)
Problem: Health Behavior:  Goal: Ability to verbalize adaptive coping strategies to use when suicidal feelings occur will improve  Ability to verbalize adaptive coping strategies to use when suicidal feelings occur will improve   Outcome: Ongoing    Goal: Ability to verbalize adaptive coping strategies to use when the urge to self-mutilate occurs will improve  Ability to verbalize adaptive coping strategies to use when the urge to self-mutilate occurs will improve   Outcome: Ongoing      Problem: Safety:  Goal: Ability to contract for his/her safety will improve  Ability to contract for his/her safety will improve   Outcome: Met This Shift      Problem: Falls - Risk of:  Goal: Will remain free from falls  Will remain free from falls   Outcome: Met This Shift    Goal: Absence of physical injury  Absence of physical injury   Outcome: Met This Shift      Problem: Altered Mood, Depressive Behavior  Goal: LTG-Able to verbalize acceptance of life and situations over which he or she has no control  Outcome: Ongoing    Goal: LTG-Able to verbalize and/or display a decrease in depressive symptoms  Outcome: Ongoing    Goal: STG-Able to verbalize suicidal ideations  Outcome: Met This Shift    Goal: STG-Able to verbalize support system  Outcome: Met This Shift    Goal: LTG-Absence of self-harm  Outcome: Met This Shift    Goal: STG-Knowledge of positive coping patterns  Outcome: Ongoing    Goal: Participation in care planning  Outcome: Ongoing      Problem: Suicide risk  Goal: Provide patient with safe environment  Provide patient with safe environment   Outcome: Met This Shift      Problem: Pain:  Goal: Pain level will decrease  Pain level will decrease   Outcome: Met This Shift    Goal: Control of acute pain  Control of acute pain   Outcome: Met This Shift    Goal: Control of chronic pain  Control of chronic pain   Outcome: Met This Shift

## 2016-02-19 NOTE — Progress Notes (Signed)
Pt has been resting in bed with eyes closed this shift with no obvious s/s of distress voiced or noted. No c/o pain.  Will continue to monitor.

## 2016-02-20 LAB — POCT GLUCOSE
POC Glucose: 248 mg/dl — ABNORMAL HIGH (ref 70–99)
POC Glucose: 148 mg/dl — ABNORMAL HIGH (ref 70–99)
POC Glucose: 238 mg/dl — ABNORMAL HIGH (ref 70–99)
POC Glucose: 219 mg/dl — ABNORMAL HIGH (ref 70–99)

## 2016-02-20 MED FILL — SULFAMETHOXAZOLE-TRIMETHOPRIM 800-160 MG PO TABS: 800-160 MG | ORAL | Qty: 1

## 2016-02-20 MED FILL — AMITIZA 24 MCG PO CAPS: 24 MCG | ORAL | Qty: 1

## 2016-02-20 MED FILL — DIAZEPAM 2 MG PO TABS: 2 MG | ORAL | Qty: 1

## 2016-02-20 MED FILL — LAMOTRIGINE 100 MG PO TABS: 100 MG | ORAL | Qty: 1

## 2016-02-20 MED FILL — LANTUS 100 UNIT/ML SC SOLN: 100 UNIT/ML | SUBCUTANEOUS | Qty: 0.2

## 2016-02-20 MED FILL — FAMOTIDINE 20 MG PO TABS: 20 MG | ORAL | Qty: 1

## 2016-02-20 MED FILL — BUPROPION HCL ER (SR) 150 MG PO TB12: 150 MG | ORAL | Qty: 1

## 2016-02-20 MED FILL — MONTELUKAST SODIUM 10 MG PO TABS: 10 MG | ORAL | Qty: 1

## 2016-02-20 MED FILL — TYLENOL EXTRA STRENGTH 500 MG PO TABS: 500 MG | ORAL | Qty: 1

## 2016-02-20 MED FILL — OLANZAPINE 10 MG PO TABS: 10 MG | ORAL | Qty: 1

## 2016-02-20 NOTE — Plan of Care (Signed)
Problem: Health Behavior:  Goal: Ability to verbalize adaptive coping strategies to use when suicidal feelings occur will improve  Ability to verbalize adaptive coping strategies to use when suicidal feelings occur will improve   Outcome: Met This Shift  Denies SI  Goal: Ability to verbalize adaptive coping strategies to use when the urge to self-mutilate occurs will improve  Ability to verbalize adaptive coping strategies to use when the urge to self-mutilate occurs will improve   Outcome: Ongoing      Problem: Safety:  Goal: Ability to contract for his/her safety will improve  Ability to contract for his/her safety will improve   Outcome: Met This Shift  Denies SI    Problem: Falls - Risk of:  Goal: Will remain free from falls  Will remain free from falls   Outcome: Met This Shift    Goal: Absence of physical injury  Absence of physical injury   Outcome: Met This Shift      Problem: Altered Mood, Depressive Behavior  Goal: LTG-Able to verbalize acceptance of life and situations over which he or she has no control  Outcome: Met This Shift  States feels "right as rain"  Goal: LTG-Able to verbalize and/or display a decrease in depressive symptoms  Outcome: Met This Shift  Rates depression 0  Goal: STG-Able to verbalize suicidal ideations  Outcome: Met This Shift  Denies SI  Goal: STG-Able to verbalize support system  Outcome: Met This Shift    Goal: LTG-Absence of self-harm  Outcome: Met This Shift    Goal: STG-Knowledge of positive coping patterns  Outcome: Met This Shift  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

## 2016-02-20 NOTE — Progress Notes (Signed)
Pt refused Amitiza this shift

## 2016-02-20 NOTE — Plan of Care (Addendum)
Problem: Altered Mood, Depressive Behavior  Goal: STG-Knowledge of positive coping patterns  Outcome: Ongoing                                                                      Group Therapy Note    Date: 02/19/2016  Start Time: 1000  End Time:  1100  Number of Participants: 6    Type of Group: Psychoeducation    Wellness Binder Information  Module Name:  Emotional Wellness  Session Number:  1    Patient's Goal:  To encourage healthy expression of emotions    Notes:  Pt participated in group activity, Toss N Talk about emotions, to encourage healthy expression of emotions.    Status After Intervention:  Improved    Participation Level: Interactive    Participation Quality: Attentive      Speech:  normal      Thought Process/Content: Logical      Affective Functioning: Blunted      Mood: anxious and depressed      Level of consciousness:  Alert and Oriented x4      Response to Learning: Able to verbalize current knowledge/experience, Able to verbalize/acknowledge new learning and Progressing to goal      Endings: None Reported    Modes of Intervention: Education      Discipline Responsible: Psychoeducational Specialist      Signature:  Clydell Hakim

## 2016-02-20 NOTE — Progress Notes (Signed)
Patient resting in bed without complaints.  Will continue to monitor for safety.

## 2016-02-20 NOTE — Progress Notes (Signed)
Progress Note  Lyanne CoBreandan A Sortor  02/20/2016 9:12 PM  Subjective:   Admit Date:   02/13/2016      CC/ADMIT DX:       Interval History:   Reviewed overnight events and nursing notes.  She denies any medical concerns.       I have reviewed all labs/diagnostics from the last 24hrs.       ROS:   I have done a 10 point ROS and all are negative, except what is mentioned in the HPI.    DIET CARB CONTROL; Carb Control: 4 carbs/meal (approximate 1800 kcals/day)    Medications:     ??? dextrose       ??? [START ON 02/21/2016] insulin glargine  25 Units Subcutaneous Daily   ??? sulfamethoxazole-trimethoprim  1 tablet Oral BID   ??? diazepam  2 mg Oral TID AC   ??? lamoTRIgine  100 mg Oral Nightly   ??? OLANZapine  10 mg Oral Nightly   ??? buPROPion  150 mg Oral Daily   ??? vitamin D  50,000 Units Oral Weekly   ??? famotidine  20 mg Oral BID   ??? lubiprostone  24 mcg Oral BID WC   ??? montelukast  10 mg Oral Nightly   ??? Ospemifene  1 tablet Oral Daily   ??? insulin lispro  0-6 Units Subcutaneous TID WC   ??? insulin lispro  0-3 Units Subcutaneous Nightly           Objective:   Vitals: BP (!) 126/93    Pulse 90    Temp 97.5 ??F (36.4 ??C) (Temporal)    Resp 20    Ht 5\' 5"  (1.651 m)    Wt 208 lb (94.3 kg)    SpO2 93%    BMI 34.61 kg/m??  No intake or output data in the 24 hours ending 02/20/16 2112  General appearance: alert and cooperative with exam  Lungs: clear to auscultation bilaterally  Heart: RRR  Abdomen: soft, non-tender; bowel sounds normal; no masses,  no organomegaly  Extremities: extremities normal, atraumatic, no cyanosis or edema  Neurologic:  No obvious focal neurologic deficits.    Assessment and Plan:   Active Problems:    Suicidal ideation    Self-mutilation    DM2    Plan:  1.  Continue present medication(s)   2.  I will adjust her insulin.  3.  Follow with Psych      Discharge planning:   her home     Reviewed treatment plans with the patient and/or family.             Electronically signed by Danny LawlessAribbe A Naleah Kofoed, MD on 02/20/2016 at 9:12 PM

## 2016-02-20 NOTE — Progress Notes (Signed)
Pt. Up ad lib on unit to dinning area ate breakfast. In TV area watching tv with peers. Compliant with medication regimen. She reports that she slept well unili 03:30 then tossed and turned. Rates depression and anxiety both has zero's. Denies auditory/visual hallucinations. Denies suicidal/homicidal ideations. . Presents self to nurses station requesting a bag that she is nauseated. Pt. Given a tub and she went to her room. I followed patient and she did vomit a moderate amount of undigested food. States she feels better. No blood or mucus noted in vomitus. Pt. Is back up in day room watching tv with peers.

## 2016-02-20 NOTE — Progress Notes (Signed)
Group Therapy Note    Date: 02/20/2016  Start Time: 1000  End Time:  1100  Number of Participants: 5    Type of Group: Psychoeducation    Wellness Binder Information  Module Name:  Relapse Prevention  Session Number:  1    Patient's Goal:  To improve knowledge of relapse prevention strategies    Notes:  Pt did not attend group discussion.  Pt was invited/encouraged.    Status After Intervention:      Participation Level:     Participation Quality:       Speech:        Thought Process/Content:       Affective Functioning:       Mood:       Level of consciousness:        Response to Learning:       Endings:     Modes of Intervention:       Discipline Responsible:       Signature:  Clydell Hakim

## 2016-02-20 NOTE — Plan of Care (Signed)
Problem: Altered Mood, Depressive Behavior  Goal: STG-Knowledge of positive coping patterns  Outcome: Ongoing                                                                      Group Therapy Note    Date: 02/20/2016  Start Time: 1430  End Time:  1515  Number of Participants: 5    Type of Group: Psychoeducation    Wellness Binder Information  Module Name:  Emotional wellness  Session Number:  2    Patient's Goal:  Anger managment    Notes:  Pt attended group as scheduled. Pt participated by identifying anger management skills. Pt participated in group discussion exploring anger management skills like exercise, deep breathing, and discussing one issue at a time.     Status After Intervention:  Improved    Participation Level: Active Listener and Interactive    Participation Quality: Appropriate, Attentive and Sharing      Speech:  normal      Thought Process/Content: Logical      Affective Functioning: Congruent      Mood: depressed      Level of consciousness:  Attentive      Response to Learning: Able to verbalize current knowledge/experience and Able to verbalize/acknowledge new learning      Endings: None Reported    Modes of Intervention: Education, Support, Socialization and Exploration      Discipline Responsible: Social Worker/Counselor      Signature:  Caleen Essex

## 2016-02-21 LAB — POCT GLUCOSE
POC Glucose: 241 mg/dl — ABNORMAL HIGH (ref 70–99)
POC Glucose: 194 mg/dl — ABNORMAL HIGH (ref 70–99)
POC Glucose: 208 mg/dl — ABNORMAL HIGH (ref 70–99)
POC Glucose: 220 mg/dl — ABNORMAL HIGH (ref 70–99)

## 2016-02-21 LAB — CULTURE, URINE: Urine Culture, Routine: >100000 — AB

## 2016-02-21 MED ORDER — OLANZAPINE 10 MG PO TABS
10 MG | ORAL_TABLET | Freq: Every evening | ORAL | 3 refills | Status: DC
Start: 2016-02-21 — End: 2016-07-10

## 2016-02-21 MED ORDER — INSULIN GLARGINE 100 UNIT/ML SC SOLN
100 UNIT/ML | Freq: Every day | SUBCUTANEOUS | Status: DC
Start: 2016-02-21 — End: 2016-02-21
  Administered 2016-02-21: 14:00:00 25 [IU] via SUBCUTANEOUS

## 2016-02-21 MED ORDER — INSULIN GLARGINE 100 UNIT/ML SC SOLN
100 UNIT/ML | Freq: Every day | SUBCUTANEOUS | 3 refills | Status: AC
Start: 2016-02-21 — End: ?

## 2016-02-21 MED ORDER — LAMOTRIGINE 100 MG PO TABS
100 MG | ORAL_TABLET | Freq: Every evening | ORAL | 1 refills | Status: DC
Start: 2016-02-21 — End: 2016-07-10

## 2016-02-21 MED ORDER — SULFAMETHOXAZOLE-TRIMETHOPRIM 800-160 MG PO TABS
800-160 MG | ORAL_TABLET | Freq: Two times a day (BID) | ORAL | 0 refills | Status: AC
Start: 2016-02-21 — End: 2016-03-02

## 2016-02-21 MED ORDER — INSULIN LISPRO 100 UNIT/ML SC SOLN
100 UNIT/ML | Freq: Three times a day (TID) | SUBCUTANEOUS | 3 refills | Status: AC
Start: 2016-02-21 — End: ?

## 2016-02-21 MED FILL — DIAZEPAM 2 MG PO TABS: 2 MG | ORAL | Qty: 1

## 2016-02-21 MED FILL — LANTUS 100 UNIT/ML SC SOLN: 100 UNIT/ML | SUBCUTANEOUS | Qty: 0.25

## 2016-02-21 MED FILL — MONTELUKAST SODIUM 10 MG PO TABS: 10 MG | ORAL | Qty: 1

## 2016-02-21 MED FILL — HYDROCODONE-ACETAMINOPHEN 5-325 MG PO TABS: 5-325 MG | ORAL | Qty: 1

## 2016-02-21 MED FILL — LAMOTRIGINE 100 MG PO TABS: 100 MG | ORAL | Qty: 1

## 2016-02-21 MED FILL — AMITIZA 24 MCG PO CAPS: 24 MCG | ORAL | Qty: 1

## 2016-02-21 MED FILL — FAMOTIDINE 20 MG PO TABS: 20 MG | ORAL | Qty: 1

## 2016-02-21 MED FILL — SULFAMETHOXAZOLE-TRIMETHOPRIM 800-160 MG PO TABS: 800-160 MG | ORAL | Qty: 1

## 2016-02-21 MED FILL — BUPROPION HCL ER (SR) 150 MG PO TB12: 150 MG | ORAL | Qty: 1

## 2016-02-21 MED FILL — OLANZAPINE 10 MG PO TABS: 10 MG | ORAL | Qty: 1

## 2016-02-21 MED FILL — TRAZODONE HCL 50 MG PO TABS: 50 MG | ORAL | Qty: 1

## 2016-02-21 NOTE — Plan of Care (Signed)
Problem: Altered Mood, Depressive Behavior  Goal: STG-Knowledge of positive coping patterns  Outcome: Ongoing                                                                      Group Therapy Note    Date: 02/21/2016  Start Time: 1100  End Time:  1200  Number of Participants: 7    Type of Group: Psychoeducation    Wellness Binder Information  Module Name:  Stress  Session Number:  5    Patient's Goal:  To improve knowledge of effective stress management techniques    Notes:  Pt participated in group discussion, identified effective stress management techniques.    Status After Intervention:  Improved    Participation Level: Interactive    Participation Quality: Attentive      Speech:  normal      Thought Process/Content: Logical      Affective Functioning: Congruent      Mood: anxious and depressed      Level of consciousness:  Alert and Oriented x4      Response to Learning: Able to verbalize current knowledge/experience, Able to verbalize/acknowledge new learning and Progressing to goal      Endings: None Reported    Modes of Intervention: Education      Discipline Responsible: Psychoeducational Specialist      Signature:  Clydell Hakim

## 2016-02-21 NOTE — Progress Notes (Signed)
[facility]  Discharge Note    Pt discharged with followings belongings:   Dentures: None  Vision - Corrective Lenses: None  Hearing Aid: None  Jewelry: Other (Comment) (see belongings document on chart)  Body Piercings Removed: N/A  Clothing: Other (Comment), None  Were All Patient Medications Collected?: Not Applicable  Other Valuables: Other (Comment) (see belongings document on chart)   Valuables sent home with PATIENT. Valuables retrieved from safe and returned to patient.  Patient left department with Departure Mode: With spouse via Mobility at Departure: Wheelchair, Ambulatory, discharged to Discharged to: Private Residence. Patient education on aftercare instructions: YES  Patient verbalize understanding of AVS:  YES.    Status EXAM upon discharge:  Status and Exam  Normal: Yes  Facial Expression: Brightened  Affect: Appropriate  Level of Consciousness: Alert  Mood:Normal: Yes  Mood: Anxious  Motor Activity:Normal: Yes  Motor Activity: Increased  Interview Behavior: Cooperative  Preception: Orient to Person, Orient to Time, Orient to Place, Orient to Situation  Attention:Normal: Yes  Attention: Unable to Concentrate  Thought Processes:  (appropriate)  Thought Content:Normal: No  Thought Content: Preoccupations  Hallucinations: None  Delusions: No  Memory:Normal: Yes  Memory: Poor Recent  Insight and Judgment: No  Insight and Judgment: Poor Insight  Present Suicidal Ideation: No  Present Homicidal Ideation: No    Hortencia ConradiLora A Cindie Rajagopalan, RN    PT calm and cooperative, affect appropriate, pt denies SI,HI and AVH, pt signed and understood all discharge information.     22

## 2016-02-21 NOTE — Discharge Instructions (Addendum)
1. You were admitted due to severe depression and inability to function and cognitive clouding related to medications. Your depression had become so severe that it was affecting your your physical condition and ability to care for yourself.  2. If you have any intent to harm yourself or anyone else, call 911 or come to the  Emergency Room.  3. You are being prescribed Zyprexa as your mood stabilizer and you must have your blood sugar regulation and lipid panel monitored as long as you are prescribed this medication.  Report any new abnormal movements to your outpatient psychiatric provider. You may need to be changed to a more metabolically friendly medication if your blood sugar remains elevated and use zyprexa only intermittently.  4. You are continuing to be prescribed Lamictal as your other mood stabilizer. Report any rash or blisters or sores in mouth as this can signal a life-threatening condition. Your lamictal could be increased and zyprexa or other agent decreased as long as your memory and thinking remain clear.  5. Wellbutrin is one of the safer antidpressants for people with bipolar chemistry. Would not increase the dose, however. This has some ability to cause mania or irritability. It can also interfere with sleep as it is activating. 02/998 people have a seizure on Wellbutrin, usually associated with alcohol use.  6. Minimize your use of any addictive substance which has little evidence for improvement with long-term use and much evidence for more impairment than benefit.Continue to taper and eventually discontinue valium and hydrocodone as these can cause severe and unpredictable respiratory depression when used together. Do not return to previous doses as you are no longer tolerant to them after a week off of them. It is likely that you will be able to discontinue Amitiza, some of your breathing medicines, and possibly heartburn medication if you get off of the these addictive medications.  7. You  have a urinary tract infection, being treated with bactrim. Complete the course of antibiotics and have your primary care check you again within a month.  You will be much less prone to urinary tract infections off of opiates.  8. Continue walking and add some Pilates to bring down your blood sugar. Discuss with your primary care if you are a candidate for a ketogenic diet and read about ways to update this. Consider new products like Halo Top icecream from [employer].   9. Move very slowly from lying to sitting to standing to walking to avoid sudden drops in blood pressure and falls.  10. Be certain that you are fully alert and responsive before operating a vehicle or machinery.   11. Consider a light therapy box or visor for seasonal affective symptoms. Get out side for at lease 15 minutes daily and open your curtains.

## 2016-02-21 NOTE — Progress Notes (Signed)
Pt ALOx4 pleasant and cooperative.  Pt med compliant, social, performs ADLs, eats well and attends groups.  Pt rates depression 0, anxiety 0 and denies SI, HI, AVH.  Will continue to monitor.

## 2016-02-21 NOTE — Progress Notes (Signed)
Pt alert and oriented. Pt calm, smiling, made good eye contact.  Pt denies SI, HI, AVH.  Rated depression and anxiety 0.  Pt med compliant and ate 90% of meal.  Pt social with staff and patients.  Pt stated "I have clarity now.  I feel good about going home.  I'm going to volunteer at the food bank and be involved at church.  I feel right as rain."      Will continue to monitor.

## 2016-02-21 NOTE — Progress Notes (Signed)
Progress Note  Joyce Cohen  02/21/2016 11:14 PM  Subjective:   Admit Date:   02/13/2016      CC/ADMIT DX:       Interval History:   Reviewed overnight events and nursing notes.  She has no physical complaints.       I have reviewed all labs/diagnostics from the last 24hrs.       ROS:   I have done a 10 point ROS and all are negative, except what is mentioned in the HPI.    DIET CARB CONTROL; Carb Control: 4 carbs/meal (approximate 1800 kcals/day)    Medications:               Objective:   Vitals: BP (!) 144/95    Pulse 94    Temp 97.6 ??F (36.4 ??C) (Temporal)    Resp 18    Ht 5\' 5"  (1.651 m)    Wt 208 lb (94.3 kg)    SpO2 94%    BMI 34.61 kg/m??  No intake or output data in the 24 hours ending 02/21/16 2314  General appearance: alert and cooperative with exam  Lungs: clear to auscultation bilaterally  Heart: RRR  Abdomen: soft, non-tender; bowel sounds normal; no masses,  no organomegaly  Extremities: extremities normal, atraumatic, no cyanosis or edema  Neurologic:  No obvious focal neurologic deficits.    Assessment and Plan:   Principal Problem:    Bipolar I disorder with mixed features (HCC)  Active Problems:    Type 2 diabetes mellitus (HCC)    Chronic constipation    Suicidal ideation    Posttraumatic stress disorder    Borderline personality disorder in adult    Self-mutilation    DM2    UTI    Plan:  1.  Continue present medication(s)   2.  She is medically stable for d/c. F/U with PCP in a few weeks, sooner for any changes or concerns.   3.  Follow with Psych      Discharge planning:   her home     Reviewed treatment plans with the patient and/or family.             Electronically signed by Danny LawlessAribbe A Karla Vines, MD on 02/21/2016 at 11:14 PM

## 2016-02-21 NOTE — Progress Notes (Signed)
Discharge Note    Pt discharging on this date. Pt denies SI, HI, and AVH at this time. Pt reports that she will be picked up by husband upon d/c from unit. Pt informed this Clinical research associatewriter that she was "feeling better, more clear, than I have in at least 3 years." Pt reports improvement in behavior and is leaving unit in overall good condition. Pt to follow up with [facility] in [place] on 02/28/2016 at 1 PM for therapy and again on 04/01/2016 at 3:30 PM for medication management with psychiatrist.  CSW and pt discussed pt's follow up appointments and importance of attending appointments as scheduled, pt voiced understanding and agreement. Pt and CSW also discussed pt safety plan and pt able to verbally identify: warning signs, coping strategies, places and people that help make the pt feel better/distract negative thoughts, friends/family/agencies/professionals the pt can reach out to in a crisis, and something that is important to the pt/worth living for. Pt provided the national suicide prevention hotline number 567-676-6334(1-301-809-2272) as well as local community behavioral health [facility]) crisis number for emergencies [phone](1-332-344-0098), in d/c folder.  Patient refused tobacco cessation counseling

## 2016-02-21 NOTE — Progress Notes (Signed)
Patient resting in bed with eyes closed.  Patient sat up and read for a while at beginning of shift.  Has voiced no complaints.  Will continue to monitor for safety.

## 2016-02-21 NOTE — Progress Notes (Signed)
Bridge Appointment completed: Reviewed Discharge Instructions with patient.    Patient verbalizes understanding and agreement with the discharge plan using the teachback method.     Referral for Outpatient Tobacco Cessation Counseling, upon discharge (mark X if applicable and completed):    ( )  Hospital staff assisted patient to call Quit Line or faxed referral                                   during hospitalization                  ( )  Recognizing danger situations (included triggers and roadblocks), if not completed on admission                    ( )  Coping skills (new ways to manage stress, exercise, relaxation techniques, changing routine, distraction), if not completed on admission                                                           ( )  Basic information about quitting (benefits of quitting, techniques in how to quit, available resources, if not completed on admission  ( ) [facility]   (x ) Patient refused referral - pt non smoker  ( ) Patient refused counseling  ( ) Patient refused smoking cessation medication upon discharge    Vaccinations (mark X if applicable and completed):  (x ) Patient states already received influenza vaccine elsewhere  ( ) Patient received influenza vaccine during this hospitalization  ( ) Patient refused influenza vaccine at this time

## 2016-02-21 NOTE — Plan of Care (Signed)
Problem: Altered Mood, Depressive Behavior  Goal: STG-Knowledge of positive coping patterns  Outcome: Ongoing                                                                      Group Therapy Note    Date: 02/21/2016  Start Time: 1430  End Time:  1530  Number of Participants: 3    Type of Group: Geophysicist/field seismologist Information  Module Name:  Mental Health Wellness  Session Number:  1    Patient's Goal:  To improve knowledge of facts about mental illness    Notes:  Pt participated in group activity, Depression Jeopardy.    Status After Intervention:  Improved    Participation Level: Interactive    Participation Quality: Attentive      Speech:  normal      Thought Process/Content: Logical      Affective Functioning: Congruent      Mood: anxious and depressed      Level of consciousness:  Alert and Oriented x4      Response to Learning: Able to verbalize current knowledge/experience, Able to verbalize/acknowledge new learning and Progressing to goal      Endings: None Reported    Modes of Intervention: Education      Discipline Responsible: Psychoeducational Specialist      Signature:  Clydell Hakim

## 2016-02-21 NOTE — Progress Notes (Signed)
SW met with treatment team to discuss pt's progress and setbacks.  SW 2 was present.  Pt reportedly slept last night, appetite is good, had episode of vomiting yesterday morning, attends scheduled group activities, social with peers/staff, affect is bright, performs ADL's, compliant with medications, denies depression/anxiety, denies SI/HI/AVH, will be discharged today, follow-up appointments will be scheduled.

## 2016-02-21 NOTE — Discharge Summary (Signed)
DISCHARGE SUMMARY      Service:  [facility] Inpatient    Patient Name:  Joyce Cohen  Date of Birth:  03-21-1955  Account No:  [id number]  Med Rec No:  [id number]  Admission Date:  02/13/2016  6:41 PM  Discharge Date:  02/21/2016  Admitting Physician:  Laymond Purser, MD   Discharge Physician:   Georgia Lopes MD  Admission Diagnosis:  Bipolar I Disorder Mixed Severe with some paranoia  Discharge Diagnosis:  Same  Admission Condition:  serious  Discharge Condition:  stable  Indication for Admission:  Worsening of mood and onset of suicidal ideation after changes in meds were causing cognitive clouding   Consults:  IP CONSULT TO PSYCHIATRY  IP CONSULT TO INTERNAL MEDICINE  IP CONSULT TO SOCIAL WORK  Diagnostic Results: CBC, TSH,Lipid panel, Gi/Liver profile, Diabetes group, Tox screens, CMP, U/A, vit D    Treatments:    [] Individual  [] Family/Collateral  [] Group PsychoTherapy with Social Service for Improved Coping Skills  [] Nursing Staff for Medication Education and Illness Management    Discharge Medications:       Medication List      START taking these medications    lamoTRIgine 100 MG tablet  Commonly known as:  LAMICTAL  Take 1 tablet by mouth nightly     OLANZapine 10 MG tablet  Commonly known as:  ZYPREXA  Take 1 tablet by mouth nightly        CHANGE how you take these medications    insulin glargine 100 UNIT/ML injection vial  Commonly known as:  LANTUS  Inject 25 Units into the skin daily  What changed:  ?? how much to take  ?? Another medication with the same name was removed. Continue taking this medication, and follow the directions you see here.     insulin lispro 100 UNIT/ML injection vial  Commonly known as:  HUMALOG  Inject 0-6 Units into the skin 3 times daily (with meals)  What changed:  ?? how much to take  ?? how to take this  ?? when to take this  Notes to patient:  **Corrective Low Dose Algorithm**  Glucose: Dose:  70-139???????????????????????? No Insulin  140-199 1 Unit  200-249 2  Units  250-299 3 Units  300-349 4 Units  350-399 5 Units  Over 399 6 Units        CONTINUE taking these medications    acetaminophen 325 MG tablet  Commonly known as:  TYLENOL  Take 2 tablets by mouth every 4 hours as needed for Pain     albuterol sulfate HFA 108 (90 Base) MCG/ACT inhaler  Commonly known as:  VENTOLIN HFA  Inhale 2 puffs into the lungs every 4 hours as needed for Wheezing     buPROPion 150 MG extended release tablet  Commonly known as:  WELLBUTRIN SR  Take 1 tablet by mouth daily     diclofenac sodium 1 % Gel     famotidine 20 MG tablet  Commonly known as:  PEPCID  Take 1 tablet by mouth 2 times daily     Lancets Misc     montelukast 10 MG tablet  Commonly known as:  SINGULAIR     NONFORMULARY     Ospemifene 60 MG Tabs  Take 1 tablet by mouth daily     traMADol 50 MG tablet  Commonly known as:  ULTRAM  Take 1 tablet by mouth 2 times daily as needed for Pain .  Vitamin D3 50000 units Caps        STOP taking these medications    carbidopa-levodopa 10-100 MG per tablet  Commonly known as:  SINEMET     diazepam 5 MG tablet  Commonly known as:  VALIUM     lubiprostone 24 MCG capsule  Commonly known as:  AMITIZA     meclizine 25 MG tablet  Commonly known as:  ANTIVERT     OXcarbazepine 150 MG tablet  Commonly known as:  TRILEPTAL     SEROQUEL XR 400 MG extended release tablet  Generic drug:  QUEtiapine     traZODone 100 MG tablet  Commonly known as:  DESYREL     vitamin D 6045450000 units Caps capsule  Commonly known as:  ERGOCALCIFEROL     zolpidem 10 MG tablet  Commonly known as:  AMBIEN        ASK your doctor about these medications    sulfamethoxazole-trimethoprim 800-160 MG per tablet  Commonly known as:  BACTRIM DS;SEPTRA DS  Take 1 tablet by mouth 2 times daily for 10 days  Ask about: Should I take this medication?           Where to Get Your Medications      You can get these medications from any pharmacy    Bring a paper prescription for each of these medications  ?? insulin glargine 100 UNIT/ML  injection vial  ?? insulin lispro 100 UNIT/ML injection vial  ?? lamoTRIgine 100 MG tablet  ?? OLANZapine 10 MG tablet  ?? sulfamethoxazole-trimethoprim 800-160 MG per tablet       Discharge Instructions:  See AVS  Diet:  regular diet  Activity:  As tolerated.  Follow [facility] on 1/10 for therapy and 2/12 for med management  Disposition:  home  Time Worked: []  Up to 30 minutes        [x]  Greater than 30 minutes    Hospital Course    Armen PickupBreandan A Gelene MinkFrederick was admitted to [facility]  Adult     Senior Service and acclimated to the unit.  A comprehensive evaluation and treatment plan were completed and safety and comfort measures implememented.  Home medications were reconciled and controlled substance prescriptions reviewed.  Medical History and Physical Examination were completed by Dr. Daphine DeutscherMartin and associate.  The following medication changes were made to address Antidepressant changed to wellbutrin and restarted on zyprexa and depakote    Reason for more than one anti-psychoticna.  Substance use withdrawal and treatment Prescribed valium and opioids tapered and recommended toward discontinuation.  Tobacco use disorder and treatment NA.    At time of discharge, patient was medically stable, cognitively intact without SI/HI psychotic symptoms. Performing ADLs without assistance, without abnormal movements, and with stable gait. It was felt by the treatment team that patient could be safely discharged to an outpatient level of care.        Electronically signed by Georgia Lopesana Mariadelaluz Guggenheim, MD on 03/29/16 at 2:13 PM

## 2016-02-21 NOTE — Progress Notes (Signed)
Group Therapy Note  DATE 02/21/16    Number of Participants:07              Patient's Goal: I AM HOPING TO GO HOME    Notes:     Status After Intervention: TOOK A SHOWER AND NOW WATCHING TV    Participation Level: GOOD    Participation Quality: GOOD      Speech: GOOD      Thought Process/Content: GOOD      Affective Functioning: GOOD      Mood: GOOD      Level of consciousness:  GOOD      Response to Learning: GOOD                      Signature:  Doreatha Martin

## 2016-02-22 MED FILL — FAMOTIDINE 20 MG PO TABS: 20 MG | ORAL | Qty: 1

## 2016-02-22 MED FILL — MONTELUKAST SODIUM 10 MG PO TABS: 10 MG | ORAL | Qty: 1

## 2016-02-22 MED FILL — OLANZAPINE 10 MG PO TABS: 10 MG | ORAL | Qty: 1

## 2016-02-22 MED FILL — LAMOTRIGINE 100 MG PO TABS: 100 MG | ORAL | Qty: 1

## 2016-02-22 MED FILL — SULFAMETHOXAZOLE-TRIMETHOPRIM 800-160 MG PO TABS: 800-160 MG | ORAL | Qty: 1

## 2016-04-01 ENCOUNTER — Ambulatory Visit: Admit: 2016-04-01 | Discharge: 2016-04-01 | Payer: BLUE CROSS/BLUE SHIELD | Primary: Emergency Medicine

## 2016-04-01 DIAGNOSIS — Z01419 Encounter for gynecological examination (general) (routine) without abnormal findings: Secondary | ICD-10-CM

## 2016-04-01 NOTE — Patient Instructions (Signed)
Patient Education        Breast Self-Exam: Care Instructions  Your Care Instructions    A breast self-exam is when you check your breasts for lumps or changes. This regular exam helps you learn how your breasts normally look and feel. Most breast problems or changes are not because of cancer.  Breast self-exam is not a substitute for a mammogram. Having regular breast exams by your doctor and regular mammograms improve your chances of finding any problems with your breasts.  Some women set a time each month to do a step-by-step breast self-exam. Other women like a less formal system. They might look at their breasts as they brush their teeth, or feel their breasts once in a while in the shower.  If you notice a change in your breast, tell your doctor.  Follow-up care is a key part of your treatment and safety. Be sure to make and go to all appointments, and call your doctor if you are having problems. It's also a good idea to know your test results and keep a list of the medicines you take.  How do you do a breast self-exam?  ?? The best time to examine your breasts is usually one week after your menstrual period begins. Your breasts should not be tender then. If you do not have periods, you might do your exam on a day of the month that is easy to remember.  ?? To examine your breasts:  ?? Remove all your clothes above the waist and lie down. When you are lying down, your breast tissue spreads evenly over your chest wall, which makes it easier to feel all your breast tissue.  ?? Use the pads-not the fingertips-of the 3 middle fingers of your left hand to check your right breast. Move your fingers slowly in small coin-sized circles that overlap.  ?? Use three levels of pressure to feel of all your breast tissue. Use light pressure to feel the tissue close to the skin surface. Use medium pressure to feel a little deeper. Use firm pressure to feel your tissue close to your breastbone and ribs. Use each pressure level to  feel your breast tissue before moving on to the next spot.  ?? Check your entire breast, moving up and down as if following a strip from the collarbone to the bra line, and from the armpit to the ribs. Repeat until you have covered the entire breast.  ?? Repeat this procedure for your left breast, using the pads of the 3 middle fingers of your right hand.  ?? To examine your breasts while in the shower:  ?? Place one arm over your head and lightly soap your breast on that side.  ?? Using the pads of your fingers, gently move your hand over your breast (in the strip pattern described above), feeling carefully for any lumps or changes.  ?? Repeat for the other breast.  ?? Have your doctor inspect anything you notice to see if you need further testing.  Where can you learn more?  Go to [email or web address] and sign in to your MyChart account. Enter P148 in the Search Health Information box to learn more about "Breast Self-Exam: Care Instructions."     If you do not have an account, please click on the "Sign Up Now" link.  Current as of: Jun 30, 2015  Content Version: 11.5  ?? 2006-2017 [employer]. Care instructions adapted under license by [facility]. If you have questions about a medical condition   or this instruction, always ask your healthcare professional. [employer] disclaims any warranty or liability for your use of this information.

## 2016-04-01 NOTE — Progress Notes (Signed)
Subjective:      Patient ID: Joyce Cohen is a 61 y.o. female.    HPI   Pt presents today for pap smear and breast exam.     Last mammogram:  04/2015  Last pap smear:  03/2015  Contraception: hyst and TL  Gravida:  2  Para:  2  AB: 0   Last bone density:  4-5 years ago  Last colonoscopy: 2016    Joyce Cohen is a 61 y.o. female with the following history as recorded in EpicCare:  Patient Active Problem List    Diagnosis Date Noted   . Self-mutilation 02/14/2016   . DDD (degenerative disc disease), lumbar 01/05/2016   . Borderline personality disorder in adult 10/10/2015   . Chronic pain associated with significant psychosocial dysfunction 10/10/2015   . Bipolar I disorder with mixed features (HCC)    . Complex regional pain syndrome I 10/04/2015   . Right lumbar radiculopathy 06/29/2015   . Acalculous cholecystitis    . Lumbar radiculopathy 12/26/2014   . Lumbar disc disease with radiculopathy 11/01/2014   . Right inguinal pain 09/22/2014   . Ilioinguinal neuralgia of right side 06/22/2014   . Bipolar disorder current episode depressed (HCC) 05/13/2014   . Suicidal ideation 05/13/2014   . Posttraumatic stress disorder 05/13/2014   . Right groin pain 02/28/2014   . Ilioinguinal neuralgia of right side    . Hypercholesteremia 02/04/2014   . Vitamin D deficiency 02/04/2014   . Suicidal ideations    . Obesity (BMI 30-39.9) 02/03/2014   . MVP (mitral valve prolapse) 02/03/2014   . Type 2 diabetes mellitus (HCC) 02/03/2014   . Right lumbar radiculopathy 02/03/2014   . Chronic constipation 02/03/2014   . Chronic constipation 12/30/2013   . RLQ abdominal pain 06/30/2012   . Rectal bleeding 06/30/2012   . Constipation 06/30/2012     Current Outpatient Prescriptions   Medication Sig Dispense Refill   . lamoTRIgine (LAMICTAL) 100 MG tablet Take 1 tablet by mouth nightly 30 tablet 1   . insulin lispro (HUMALOG) 100 UNIT/ML injection vial Inject 0-6 Units into the skin 3 times daily (with meals) 1 vial 3   .  insulin glargine (LANTUS) 100 UNIT/ML injection vial Inject 25 Units into the skin daily 1 vial 3   . OLANZapine (ZYPREXA) 10 MG tablet Take 1 tablet by mouth nightly 30 tablet 3   . diclofenac sodium 1 % GEL Apply 2 g topically daily as needed for Pain     . Cholecalciferol (VITAMIN D3) 50000 units CAPS Take 50,000 Units by mouth     . traMADol (ULTRAM) 50 MG tablet Take 1 tablet by mouth 2 times daily as needed for Pain . 45 tablet 2   . Ospemifene 60 MG TABS Take 1 tablet by mouth daily 90 tablet 1   . acetaminophen (TYLENOL) 325 MG tablet Take 2 tablets by mouth every 4 hours as needed for Pain 120 tablet 3   . albuterol sulfate HFA (VENTOLIN HFA) 108 (90 Base) MCG/ACT inhaler Inhale 2 puffs into the lungs every 4 hours as needed for Wheezing 1 Inhaler 3   . famotidine (PEPCID) 20 MG tablet Take 1 tablet by mouth 2 times daily 60 tablet 3   . Lancets MISC      . NONFORMULARY Arthritis hand ointment     . montelukast (SINGULAIR) 10 MG tablet Take 10 mg by mouth nightly     . buPROPion SR (WELLBUTRIN SR) 150 MG  SR tablet Take 1 tablet by mouth daily 60 tablet 3     No current facility-administered medications for this visit.      Allergies: Tobacco [nicotiana tabacum]; Decongestant [pseudoephedrine hcl]; Motrin [ibuprofen]; and Cabbage  Past Medical History:   Diagnosis Date   . Abdominal pain, right lower quadrant    . 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 Arthritis    . Bipolar disorder (HCC)    . Blood circulation, collateral    . Cancer (HCC)     skin cancer   . Diabetes mellitus (HCC)    . Hearing aid worn     bilateral   . Hernia    . Hyperlipidemia    . MVP (mitral valve prolapse)    . Neuromuscular disorder (HCC) Inguinal Nerve pain   . Pleurisy without mention of effusion or current tuberculosis    . Right groin pain 02/28/2014   . Stomach ulcer     as a child   . Torn rotator cuff     right shoulder   . Unspecified asthma(493.90)    . Unspecified constipation    . Unspecified hemorrhoids  without mention of complication      Past Surgical History:   Procedure Laterality Date   . ABDOMEN SURGERY     . ABDOMINAL EXPLORATION SURGERY      times two due to adhesions   . APPENDECTOMY     . CHOLECYSTECTOMY, LAPAROSCOPIC N/A 02/24/2015    CHOLECYSTECTOMY LAPAROSCOPIC performed by Gabriel Rainwater, [facility] at [facility] OR   . CHOLECYSTECTOMY, LAPAROSCOPIC     . COLON SURGERY  04/2013    1 polyp removed   . COLONOSCOPY  05/19/11    Dr Renato Gails   . COLONOSCOPY  07/15/2012    Dr. Zollie Beckers   . COLONOSCOPY  2016   . HYSTERECTOMY     . ROTATOR CUFF REPAIR Right    . SKIN BIOPSY  L and R wrists   . TONSILLECTOMY     . TUBAL LIGATION     . WISDOM TOOTH EXTRACTION       Family History   Problem Relation Age of Onset   . Alcohol Abuse Father    . Mental Illness Father    . Alcohol Abuse Mother    . Mental Illness Mother    . Hypertension Mother    . Alcohol Abuse Brother    . Cancer Paternal Grandfather    . Mental Illness Maternal Grandmother    . Hypertension Maternal Grandmother    . Mental Illness Daughter    . Colon Cancer Neg Hx    . Colon Polyps Neg Hx    . Liver Cancer Neg Hx    . Stomach Cancer Neg Hx    . Ulcerative Colitis Neg Hx    . Crohn's Disease Neg Hx    . Liver Disease Neg Hx    . Rectal Cancer Neg Hx      Social History   Substance Use Topics   . Smoking status: Never Smoker   . Smokeless tobacco: Never Used      Comment: Disabled due bipolar/autism   . Alcohol use No         Review of Systems   Constitutional: Negative.    HENT: Negative.    Eyes: Negative.    Respiratory: Negative.    Cardiovascular: Negative.    Gastrointestinal: Negative.    Endocrine: Negative.    Genitourinary:  Negative.    Musculoskeletal: Negative.    Skin: Negative.    Allergic/Immunologic: Negative.    Neurological: Negative.    Hematological: Negative.    Psychiatric/Behavioral: Negative.        Objective:   Physical Exam   Constitutional: She is oriented to person, place, and time. She appears well-developed.   Neck: No thyromegaly present.    Cardiovascular: Normal rate and regular rhythm.    Pulmonary/Chest: Effort normal and breath sounds normal. Right breast exhibits no inverted nipple, no mass, no nipple discharge, no skin change and no tenderness. Left breast exhibits no inverted nipple, no mass, no nipple discharge, no skin change and no tenderness. Breasts are symmetrical.   Abdominal: Soft. She exhibits no mass. There is no tenderness. There is no guarding.   Genitourinary: Vagina normal and uterus normal. No erythema in the vagina. No vaginal discharge found.   Genitourinary Comments: Normal external genitalia  Vaginal cuff intact pap obtained     Musculoskeletal: Normal range of motion.   Lymphadenopathy:     She has no cervical adenopathy.   Neurological: She is alert and oriented to person, place, and time.   Skin: Skin is warm.   Psychiatric: She has a normal mood and affect.       Assessment:      1. Encounter for gynecological examination without abnormal finding  PAP SMEAR   2. Screening for cervical cancer  PAP SMEAR   3. Encounter for screening mammogram for breast cancer  MAM DIGITAL SCREEN BILATERAL           Plan:      MEDICATIONS:  No orders of the defined types were placed in this encounter.      ORDERS:  Orders Placed This Encounter   Procedures   . MAM DIGITAL SCREEN BILATERAL   . PAP SMEAR     The importance of self-breast examination was discussed, as well as yearly mammograms after age 52.  A well balanced diet and exercise were encouraged.  The risk factors for osteopenia/osteoporosis were discussed along with need for additional calcium and vitamin D.   A colonoscopy is recommended at age 71 unless otherwise indicated based on symptoms or family history.  The risks vs. benefits of HRT were discussed with patient and all questions answered. mammo ordered and pt is to sch.

## 2016-04-05 ENCOUNTER — Inpatient Hospital Stay
Admit: 2016-04-05 | Discharge: 2016-04-08 | Payer: BLUE CROSS/BLUE SHIELD | Attending: Family | Primary: Emergency Medicine

## 2016-04-05 DIAGNOSIS — M5116 Intervertebral disc disorders with radiculopathy, lumbar region: Secondary | ICD-10-CM

## 2016-04-05 MED ORDER — TRAMADOL HCL 50 MG PO TABS
50 | ORAL_TABLET | Freq: Two times a day (BID) | ORAL | 2 refills | Status: AC | PRN
Start: 2016-04-05 — End: 2016-05-17

## 2016-04-05 NOTE — Progress Notes (Signed)
[facility]  Patient Pain Assessment  Progress Note       Chief Complaint   Patient presents with   ??? Lower Back Pain     radiates into right leg     Pain Assessment  Pain Assessment: 0-10  Pain Level: 6  Pain Type: Chronic pain  Pain Location: Back   Pain Orientation: Lower  Pain Radiating Towards: into right leg  Pain Descriptors: Aching, Throbbing, Radiating, Tingling, Numbness  Pain Frequency: Intermittent  Pain Onset: On-going  Clinical Progression: Not changed  Effect of Pain on Daily Activities: limits activity       [x]   Issues of Concern:     Reports patient went to [facility] ED, states she is bipolar and was admitted for medication change.   Patient wants to discuss option of DCS with Dr. Sandria ManlyLove at next visit  ??   [x]   Reports current medication is helping, but continues to have on-going pain    [x]   Reports current pain medication increases ability to do activities of daily living     []   No current narcotic pain medications      [x]   Discussed possible medication side effects, risk of tolerance and/or dependence, alternative treatments    [x]   Encouraged to set goals of decreasing daily narcotic intake    [x]   Discussed effects of long term narcotic use    [x]   Reviewed pain management contract     [x]   Injection options discussed and repeat injection scheduled     [] Yes [x] No  Current medication side effects     [] Yes [x] No   Acute bladder or bowel changes    Previous Procedure / Percentage of pain control / Imaging / PT History:  Percentage of Pain Relief after Last Procedure:  90 %   How long lasted: 1 month  ??  Radiology exams received during the last 12 months: No  MRI exams received in the past 2 years: MRI lumbar spine 08/2015 @ [facility]  Physical therapy during the last 6 months: No     BMI: Body mass index is 35.78 kg/m??.    [x]   Nutrient rich, low fat, low carbohydrate diet discussed   [x]   Positive effect of weight management on pain control discussed    Activity:   [x]  Exercise  discussed as beneficial to pain reduction, encouraged stretching exercises and to set daily goals    Tobacco use:    [x]  Cessation discussed, as applicable    Social History     Social History   ??? Marital status: Married     Spouse name: Johnnie   ??? Number of children: 2   ??? Years of education: 7415     Social History Main Topics   ??? Smoking status: Never Smoker   ??? Smokeless tobacco: Never Used      Comment: Disabled due bipolar/autism   ??? Alcohol use No   ??? Drug use: No   ??? Sexual activity: Not Currently     Partners: Male     Other Topics Concern   ??? None     Social History Narrative   ??? None  Past Medical History:       Diagnosis Date   ??? Abdominal pain, right lower quadrant    ??? 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 Arthritis    ??? Bipolar disorder (HCC)    ??? Blood circulation, collateral    ??? Cancer (HCC)     skin cancer   ??? Diabetes mellitus (HCC)    ??? Hearing aid worn     bilateral   ??? Hernia    ??? Hyperlipidemia    ??? MVP (mitral valve prolapse)    ??? Neuromuscular disorder (HCC) Inguinal Nerve pain   ??? Pleurisy without mention of effusion or current tuberculosis    ??? Right groin pain 02/28/2014   ??? Stomach ulcer     as a child   ??? Torn rotator cuff     right shoulder   ??? Unspecified asthma(493.90)    ??? Unspecified constipation    ??? Unspecified hemorrhoids without mention of complication      Surgical History:  Past Surgical History:   Procedure Laterality Date   ??? ABDOMEN SURGERY     ??? ABDOMINAL EXPLORATION SURGERY      times two due to adhesions   ??? APPENDECTOMY     ??? CHOLECYSTECTOMY, LAPAROSCOPIC N/A 02/24/2015    CHOLECYSTECTOMY LAPAROSCOPIC performed by Gabriel Rainwater, MD at [facility] OR   ??? CHOLECYSTECTOMY, LAPAROSCOPIC     ??? COLON SURGERY  04/2013    1 polyp removed   ??? COLONOSCOPY  05/19/11    Dr Renato Gails   ??? COLONOSCOPY  07/15/2012    Dr. Zollie Beckers   ??? COLONOSCOPY  2016   ??? HYSTERECTOMY     ??? ROTATOR CUFF REPAIR Right    ??? SKIN BIOPSY  L and R  wrists   ??? TONSILLECTOMY     ??? TUBAL LIGATION     ??? WISDOM TOOTH EXTRACTION       Family History:  family history includes Alcohol Abuse in her brother, father, and mother; Cancer in her paternal grandfather; Hypertension in her maternal grandmother and mother; Mental Illness in her daughter, father, maternal grandmother, and mother.     Allergies:  Tobacco [nicotiana tabacum]; Decongestant [pseudoephedrine hcl]; Motrin [ibuprofen]; and Cabbage     Medications:  Current Outpatient Prescriptions   Medication Sig Dispense Refill   ??? QUEtiapine (SEROQUEL) 200 MG tablet Take 200 mg by mouth nightly     ??? traZODone (DESYREL) 100 MG tablet Take 100 mg by mouth nightly     ??? [START ON 04/17/2016] traMADol (ULTRAM) 50 MG tablet Take 1 tablet by mouth 2 times daily as needed for Pain (may fill 04/17/2016) for up to 30 days. 45 tablet 2   ??? lamoTRIgine (LAMICTAL) 100 MG tablet Take 1 tablet by mouth nightly 30 tablet 1   ??? insulin lispro (HUMALOG) 100 UNIT/ML injection vial Inject 0-6 Units into the skin 3 times daily (with meals) 1 vial 3   ??? insulin glargine (LANTUS) 100 UNIT/ML injection vial Inject 25 Units into the skin daily 1 vial 3   ??? OLANZapine (ZYPREXA) 10 MG tablet Take 1 tablet by mouth nightly 30 tablet 3   ??? diclofenac sodium 1 % GEL Apply 2 g topically daily as needed for Pain     ??? Cholecalciferol (VITAMIN D3) 50000 units CAPS Take 50,000 Units by mouth     ??? acetaminophen (TYLENOL) 325 MG tablet Take 2 tablets by mouth every 4 hours as needed  for Pain 120 tablet 3   ??? albuterol sulfate HFA (VENTOLIN HFA) 108 (90 Base) MCG/ACT inhaler Inhale 2 puffs into the lungs every 4 hours as needed for Wheezing 1 Inhaler 3   ??? famotidine (PEPCID) 20 MG tablet Take 1 tablet by mouth 2 times daily 60 tablet 3   ??? Lancets MISC      ??? NONFORMULARY Arthritis hand ointment     ??? montelukast (SINGULAIR) 10 MG tablet Take 10 mg by mouth nightly     ??? buPROPion SR (WELLBUTRIN SR) 150 MG SR tablet Take 1 tablet by mouth daily 60  tablet 3     No current facility-administered medications for this encounter.      UDS:     [x]  Reviewed and monitoring, see labs         Vitals:  BP 122/84    Pulse 81    Temp 98.8 ??F (37.1 ??C) (Temporal)    Resp 18    Ht 5\' 5"  (1.651 m)    Wt 215 lb (97.5 kg)    LMP  (LMP Unknown)    SpO2 96%    BMI 35.78 kg/m??      [x]  Discussed using home B/P monitor/diary and to contact PCP if elevated, as applicable     Physical Exam:  General appearance: no acute distress   Head: NCAT, EOMI  Skin: Warm, Dry   Musculoskeletal: ambulatory per self, steady gait  Neurologic: alert and oriented X 3, speech clear  Mood and affect: appropriate, no SI or HI    Assessment:    *      Lumbar DDD  *      Lumbar Radiculopathy    Plan:   [x]   Patient is to call with any questions or concerns which may arise prior to the next office visit    [x]   Continue current medications per our office, see medication tab, KASPER reviewed   []   Add...   []   Discontinue...   []   Imaging order given to patient   []   PT order given to patient   [x]   Procedure scheduled for next visit, (LESI L4-5 with sedation)   [x]   Patient wants to discuss DCS with Dr. Sandria Manly at next visit    Controlled Substance Monitoring: Discussed with patient possible medication side effects, risk of tolerance and/or dependence, and alternative treatments. Discussed the effects of long term narcotic use. Patient encouraged to set daily goals of exercising and decreasing daily narcotic intake.      Electronically signed by Donnetta Hail, APRN

## 2016-04-05 NOTE — Progress Notes (Signed)
Nursing Admission Record    Current Issues / Falls / ER Visits:  Yes Patient went to [facility] ED, states she is bipolar and was admitted for medication change    Percentage of Pain Relief after Last Procedure:  90 %    How long lasted:  1 months    Radiology exams received during the last 12 months: No       When na                                              Where na       Imaging on chart: No         Imaging records requested: No  MRI exams received in the past 2 years:  Yes MRI lumbar spine 08/2015 @ [facility]  Physical therapy during the last 6 months: No       When: na                                             Where  na  Labs during the last 12 months: Yes    Education Provided:  [x]  Review of [place]  []  Agreement Review  []  Compliance Issues Discussed    []  Cognitive Behavior Needs [x]  Exercise []  Review of Test []  Financial Issues  []  Tobacco/Alcohol Use [x]  Teaching []  New Patient []  Picture Obtained    Physician Plan:  []  Outgoing Referral  []  Pharmacy Consult  []  Test Ordered   []  Obtained Test Results / Consult Notes  []  UDS due at next visit, verified per EPIC      []  Suspected Physical Abuse or Suicide Risk assessed - IF YES COMPLETE QUESTIONS BELOW    If any of the following questions are answered yes - contact attending physician for referral:    Has been considering harming self to escape stress, pain problems?  []  YES  [x]  NO  Has a suicide plan? []  YES  [x]  NO  Has attempted suicide in the past?   []  YES  [x]  NO  Has a close friend or family member who committed suicide?  []  YES  [x]  NO    Patient Referred To :     Additional Notes:    Assessment Completed by:  Electronically signed by Marnee Guarneri, RN on 04/05/2016 at 3:53 PM

## 2016-04-22 ENCOUNTER — Inpatient Hospital Stay: Admit: 2016-04-22 | Payer: BLUE CROSS/BLUE SHIELD | Primary: Emergency Medicine

## 2016-04-22 DIAGNOSIS — Z1231 Encounter for screening mammogram for malignant neoplasm of breast: Secondary | ICD-10-CM

## 2016-05-10 ENCOUNTER — Inpatient Hospital Stay
Admit: 2016-05-10 | Discharge: 2016-05-10 | Payer: BLUE CROSS/BLUE SHIELD | Attending: Pain Medicine | Primary: Emergency Medicine

## 2016-05-10 ENCOUNTER — Encounter

## 2016-05-10 ENCOUNTER — Inpatient Hospital Stay: Admit: 2016-05-10 | Discharge: 2016-05-10 | Payer: BLUE CROSS/BLUE SHIELD | Primary: Emergency Medicine

## 2016-05-10 DIAGNOSIS — M5116 Intervertebral disc disorders with radiculopathy, lumbar region: Secondary | ICD-10-CM

## 2016-05-10 DIAGNOSIS — M5136 Other intervertebral disc degeneration, lumbar region: Secondary | ICD-10-CM

## 2016-05-10 MED ORDER — LIDOCAINE HCL (PF) 1 % IJ SOLN
1 % | Freq: Once | INTRAMUSCULAR | Status: AC | PRN
Start: 2016-05-10 — End: 2016-05-10
  Administered 2016-05-10: 17:00:00 5 via INTRADERMAL

## 2016-05-10 MED ORDER — MIDAZOLAM HCL 2 MG/2ML IJ SOLN
2 | INTRAMUSCULAR | Status: AC
Start: 2016-05-10 — End: 2016-05-10

## 2016-05-10 MED ORDER — BUPIVACAINE HCL (PF) 0.25 % IJ SOLN
0.25 | INTRAMUSCULAR | Status: AC
Start: 2016-05-10 — End: 2016-05-10

## 2016-05-10 MED ORDER — METHYLPREDNISOLONE ACETATE 80 MG/ML IJ SUSP
80 MG/ML | Freq: Once | INTRAMUSCULAR | Status: AC | PRN
Start: 2016-05-10 — End: 2016-05-10
  Administered 2016-05-10: 17:00:00 80 via EPIDURAL

## 2016-05-10 MED ORDER — MIDAZOLAM HCL 2 MG/2ML IJ SOLN
2 MG/ML | Freq: Once | INTRAMUSCULAR | Status: AC | PRN
Start: 2016-05-10 — End: 2016-05-10
  Administered 2016-05-10: 17:00:00 2 via INTRAVENOUS

## 2016-05-10 MED ORDER — BUPIVACAINE HCL (PF) 0.25 % IJ SOLN
0.25 % | Freq: Once | INTRAMUSCULAR | Status: AC | PRN
Start: 2016-05-10 — End: 2016-05-10
  Administered 2016-05-10: 17:00:00 5 via EPIDURAL

## 2016-05-10 MED ORDER — SODIUM CHLORIDE 0.9 % IJ SOLN
0.9 % | Freq: Once | INTRAMUSCULAR | Status: AC | PRN
Start: 2016-05-10 — End: 2016-05-10
  Administered 2016-05-10: 17:00:00 4

## 2016-05-10 MED ORDER — METHYLPREDNISOLONE ACETATE 80 MG/ML IJ SUSP
80 | INTRAMUSCULAR | Status: AC
Start: 2016-05-10 — End: 2016-05-10

## 2016-05-10 MED ORDER — SODIUM CHLORIDE 0.9 % IJ SOLN
0.9 | INTRAMUSCULAR | Status: AC
Start: 2016-05-10 — End: 2016-05-10

## 2016-05-10 MED ORDER — IOHEXOL 240 MG/ML IJ SOLN
240 MG/ML | Freq: Once | INTRAMUSCULAR | Status: AC | PRN
Start: 2016-05-10 — End: 2016-05-10
  Administered 2016-05-10: 17:00:00 2 via EPIDURAL

## 2016-05-10 MED FILL — MIDAZOLAM HCL 2 MG/2ML IJ SOLN: 2 MG/ML | INTRAMUSCULAR | Qty: 2

## 2016-05-10 MED FILL — SODIUM CHLORIDE 0.9 % IJ SOLN: 0.9 % | INTRAMUSCULAR | Qty: 10

## 2016-05-10 MED FILL — DEPO-MEDROL 80 MG/ML IJ SUSP: 80 MG/ML | INTRAMUSCULAR | Qty: 1

## 2016-05-10 MED FILL — BUPIVACAINE HCL (PF) 0.25 % IJ SOLN: 0.25 % | INTRAMUSCULAR | Qty: 10

## 2016-05-10 NOTE — Progress Notes (Signed)
Patient Name: Joyce Cohen  DOB: 1955/08/05  MRN: [id number]    PRE-SEDATION ASSESSMENT    Procedure:  @PROCEDURE @  I have examined the patient's status immediately prior to the procedure.      BRIEF H&P    HPI/Changes/Indicators/Diagnosis  There are no active hospital problems to display for this patient.      Medications:  Prior to Admission medications    Medication Sig Start Date End Date Taking? Authorizing Provider   traZODone (DESYREL) 100 MG tablet Take 100 mg by mouth nightly 03/13/16   Historical Provider, MD   QUEtiapine (SEROQUEL) 200 MG tablet Take 200 mg by mouth nightly 03/14/16   Historical Provider, MD   traMADol (ULTRAM) 50 MG tablet Take 1 tablet by mouth 2 times daily as needed for Pain (may fill 04/17/2016) for up to 30 days. 04/17/16 05/17/16  Beatrix Fetters, MD   lamoTRIgine (LAMICTAL) 100 MG tablet Take 1 tablet by mouth nightly 02/21/16   Georgia Lopes, MD   insulin lispro (HUMALOG) 100 UNIT/ML injection vial Inject 0-6 Units into the skin 3 times daily (with meals) 02/21/16   Georgia Lopes, MD   insulin glargine (LANTUS) 100 UNIT/ML injection vial Inject 25 Units into the skin daily 02/22/16   Georgia Lopes, MD   OLANZapine (ZYPREXA) 10 MG tablet Take 1 tablet by mouth nightly 02/21/16   Georgia Lopes, MD   diclofenac sodium 1 % GEL Apply 2 g topically daily as needed for Pain    Historical Provider, MD   Cholecalciferol (VITAMIN D3) 50000 units CAPS Take 50,000 Units by mouth    Historical Provider, MD   acetaminophen (TYLENOL) 325 MG tablet Take 2 tablets by mouth every 4 hours as needed for Pain 10/11/15   Marcelina Morel Ballew, DO   albuterol sulfate HFA (VENTOLIN HFA) 108 (90 Base) MCG/ACT inhaler Inhale 2 puffs into the lungs every 4 hours as needed for Wheezing 10/11/15   Jeanene Erb, DO   famotidine (PEPCID) 20 MG tablet Take 1 tablet by mouth 2 times daily 10/11/15   Jeanene Erb, DO   Lancets MISC  08/31/15   Historical Provider, MD   NONFORMULARY Arthritis hand ointment    Historical  Provider, MD   montelukast (SINGULAIR) 10 MG tablet Take 10 mg by mouth nightly    Historical Provider, MD   buPROPion SR [facility] SR) 150 MG SR tablet Take 1 tablet by mouth daily 05/16/14   Maylene Roes, MD       Allergies:  is allergic to tobacco [nicotiana tabacum]; decongestant [pseudoephedrine hcl]; motrin [ibuprofen]; and cabbage.    Vital Signs:  Vitals:    05/10/16 1312   BP: 132/78   Pulse: 90   Resp: 16   Temp:    SpO2: 95%       Physical Exam:  Cardiac:                                    [x] WNL                    [] Comments:    Pulmonary:                               [x] WNL                    [] Comments:  Neuro/Mental Status:               [x] WNL                    [] Comments:      Informed Consent:  The risks and benefits of the procedure have been discussed with either the patient or if they cannot consent their representative.    Assessment:  Patient examined and appropriate for the planned sedation and procedure.    Plan:  Proceed with planned sedation and procedure as above.     Mallampati Airway Assessment: Adequate      ASA STATUS:  [] 1. Normal Healty  [] 2. Mild Systemice Disease, doesn't limit activity eg HTN, mild DM  [x] 3. Severe Systemic Disease, does limit activity eg stable angina, DM with vascular         disease          [] 4.Severe Systemic Disease constant threat eg CHF, renal failure  [] 5. Moribund not expected to survive without procedure          Yves Dill, RN

## 2016-05-10 NOTE — Procedures (Signed)
DATE: 05/10/2016      REASON FOR VISIT: Lumbar Degenerative Disc Disease, Lumbar Radiculopathy    Procedure:  Level of Consciousness: [x] Alert [x] Oriented [] Disoriented [] Lethargic  Anxiety Level: [x] Calm [] Anxious [] Depressed [] Other  Skin: [x] Warm [x] Dry [] Cool [] Moist [] Intact [] Other  Cardiovascular: [] Palpitations: [x] Never [] Occasionally [] Frequently  Chest Pain: [x] No [] Yes  Respiratory:  [x] Unlabored [] Labored [] Cough ([]  Productive [] Unproductive)  HCG Required: [x] No [] Yes   Results: [] Negative [] Positive  Knowledge Level:        [x] Patient/Other verbalized understanding of pre-procedure instructions.        [x] Assessment of post-op care needs (transportation, responsible caregiver)        [x] Able to discuss health care problems and how to deal with it.  Factors that Affect Teaching:        Language Barrier: [x] No [] Yes - why:        Hearing Loss:        [x] No [] Yes            Corrective Device:  [] Yes [] No        Vision Loss:           [] No [x] Yes            Corrective Device:  [x] Yes [] No        Memory Loss:       [x] No [] Yes            [] Short Term [] Long Term  Motivational Level:  [x] Asks Questions                  [] Extremely Anxious       [x] Seems Interested               [] Seems Uninterested                  [x] Denies need for Education  Risk for Injury:  [x] Patient oriented to person, place and time  [] History of frequent falls/loss of balance  Nutritional:  [] Change in appetite   [] Weight Gain   [] Weight Loss  Functional:    Current Issues / Falls / ER Visits:  No    Percentage of Pain Relief after Last Procedure:  100 %    How long lasted:  1.5 months    Radiology exams received during the last 12 months: Yes       [facility]       Imaging on chart: Yes         Imaging records requested: No  MRI exams received in the past 2 years:  Yes  Physical therapy during the last 6 months: No       Labs during the last 12 months:  Yes    Education Provided:  [x]  Review of [place]  [x]  Agreement Review  []  Compliance Issues Discussed    []  Cognitive Behavior Needs [x]  Exercise []  Review of Test []  Financial Issues  []  Tobacco/Alcohol Use [x]  Teaching []  New Patient []  Picture Obtained      PROCEDURE: Lumbar Epidural Steroid Injection.   []  Moderate Sedation    DESCRIPTION OF PROCEDURE:              After obtaining informed consent, the patient was taken to the procedure room, positioned prone,  and sterilely prepped.  The procedure was performed under fluoroscopic guidance.  First 5 ml of 1% Xylocaine was used at L3 - 4 for local anesthesia.  A 19-gauge Hustead needle was advanced to the epidural space.  The was confirmed on the fluoroscope with an injection of [x]  2ml  []  ml Contrast.  Then, [x]  5ml of 0.25% Marcaine, [x]  4ml of Normal Saline, and [x]  80 mg of Depo Medrol was gently injected.        There were no complications.    DIAGNOSES:  []  Low Back Pain  [x]  *Lumbar Radiculopathy  [x]  *Lumbar Degenerative Disc Disease  [x]  *Lumbar Spinal Stenosis  []  *Lumbar Postlaminectomy Syndrome  []  Other    PLAN:  [x]  Will return to office in  6 week(s)for  []  Planned Procedure  [x]  Office Visit  []  Prescriptions were given today   [x]  No prescriptions needed today  [x]  Patient is to call with any questions or concerns which may arise prior to the next office visit.    COMMENTS:  Patient c/o low back pain. Discussed risks and benefits of procedure with patient. Will proceed with lumbar epidural injection today.    Discussed with patient about dorsal column stimulator. She brought this up, has looked into it extensively. This would be intended to cover her abdominal pain as well as her back.   Discussed with patient about obtaining a new MRI.  Also discussed with patient about a psych eval.           [x]  Over 50% of today's appointment was given to discussion, evaluation and counseling.

## 2016-05-10 NOTE — Discharge Instructions (Signed)
Dr. Claudie Reveringiley Love  Discharge Instructions / Information        You have had the procedure(s) called:    []  Cervical Epidural     [x]  Lumbar Epidural     []  _____________Injection  []  Cervical Facets  []  Lumbar Facets []  Radiofrequency Lesioning  []  Occipital Nerve Blocks []  Caudal Epidural []  Transforaminal Epidural  []  Trigger Point Injections []  SI Joint Injection []  ______________________    Please follow these instructions carefully.      If you have questions or problems you may call [phone].     You have received the following medications:  [x]  Lidocaine []  Bupivicaine: []  0.50%   [x]  0.25% [x]  DepoMedrol   [x]  Normal Saline  [x]  Versed []  Sodium Bicarbonate   []  Isovue   []  Botox  []  Kenalog   []  Diprovan  [x]  _omnipaque____________________    PATIENT INFORMATION:  You may experience the following symptoms after your procedure.  These symptoms are normal and should not cause alarm.     An increase in our pain may occurr.  This may last 24-48 hours after your procedure.   No change in pain.   Weakness or numbness in your affected extremity.  This will usually only last a few hours.    REMEMBER TO REPORT THE FOLLOWING SYMPTOMS TO YOUR DOCTOR:   Redness, swelling. Or drainage at the injection site.   Unusual pain that interferes with your usual activities of daily living.    OTHER INSTRUCTIONS:  [x]  I will apply ice to the injection site for 24 hours.  Apply 15-2- minutes and take off 40 minutes.  [x]  I understand if a steroid was used it will take effect in 3-5 days.  [x]  I have received my personal belongings and valuable.  [x]  I have received a copy of my discharge instructions and understand to my satisfaction.  [x]  I will not drive for []  12 hours    [x]  24 hours after my injections.  []  I understand that today I will  []  rest  []  walk and move freely  []  other _________________  []  Additional instructions:  ____________________________________________________________________________        Patient  Discharge:  [x]  Home  []  Hospital  []  Other  ___________________________________    How:  []  Ambulatory  [x]  Wheelchair   []  Stretcher   []  __________________________________    Accompanied by:  []  Family Member  []  Friend  []  Hospital Staff  []  Ambulance Staff    []  Other_____________________________

## 2016-05-10 NOTE — Progress Notes (Shared)
Procedure:  Level of Consciousness: [x] Alert [x] Oriented [] Disoriented [] Lethargic  Anxiety Level: [x] Calm [] Anxious [] Depressed [] Other  Skin: [x] Warm [x] Dry [] Cool [] Moist [] Intact [] Other  Cardiovascular: [] Palpitations: [x] Never [] Occasionally [] Frequently  Chest Pain: [x] No [] Yes  Respiratory:  [x] Unlabored [] Labored [] Cough ([]  Productive [] Unproductive)  HCG Required: [x] No [] Yes   Results: [] Negative [] Positive  Knowledge Level:        [x] Patient/Other verbalized understanding of pre-procedure instructions.        [x] Assessment of post-op care needs (transportation, responsible caregiver)        [x] Able to discuss health care problems and how to deal with it.  Factors that Affect Teaching:        Language Barrier: [x] No [] Yes - why:        Hearing Loss:        [x] No [] Yes            Corrective Device:  [] Yes [] No        Vision Loss:           [] No [x] Yes            Corrective Device:  [x] Yes [] No        Memory Loss:       [x] No [] Yes            [] Short Term [] Long Term  Motivational Level:  [x] Asks Questions                  [] Extremely Anxious       [x] Seems Interested               [] Seems Uninterested                  [x] Denies need for Education  Risk for Injury:  [x] Patient oriented to person, place and time  [] History of frequent falls/loss of balance  Nutritional:  [] Change in appetite   [] Weight Gain   [] Weight Loss  Functional:  [] Requires assistance with ADL'sNursing Admission Record    Current Issues / Falls / ER Visits:  No    Percentage of Pain Relief after Last Procedure:  100 %    How long lasted:  1.5 months    Radiology exams received during the last 12 months: Yes       [facility]                                              [facility]       Imaging on chart: Yes         Imaging records requested: No  MRI exams received in the past 2 years:  Yes  Physical therapy during the last 6 months: No       Labs during the last 12 months: Yes    Education Provided:  [x]  Review of [place]  [x]   Agreement Review  []  Compliance Issues Discussed    []  Cognitive Behavior Needs [x]  Exercise []  Review of Test []  Financial Issues  []  Tobacco/Alcohol Use [x]  Teaching []  New Patient []  Picture Obtained    Physician Plan:  []  Outgoing Referral  []  Pharmacy Consult  []  Test Ordered   []  Obtained Test Results / Consult Notes  []  UDS due at next visit, verified per EPIC      []  Suspected Physical Abuse or Suicide Risk assessed - IF YES COMPLETE QUESTIONS BELOW    If any  of the following questions are answered yes - contact attending physician for referral:    Has been considering harming self to escape stress, pain problems?  []  YES  []  NO  Has a suicide plan? []  YES  []  NO  Has attempted suicide in the past?   []  YES  []  NO  Has a close friend or family member who committed suicide?  []  YES  []  NO    Patient Referred To :     Additional Notes:    Assessment Completed by:  Electronically signed by Newt Lukes, RN on 05/10/2016 at 12:17 PM

## 2016-05-24 ENCOUNTER — Inpatient Hospital Stay: Admit: 2016-05-24 | Payer: BLUE CROSS/BLUE SHIELD | Primary: Emergency Medicine

## 2016-05-24 ENCOUNTER — Ambulatory Visit: Payer: BLUE CROSS/BLUE SHIELD | Primary: Emergency Medicine

## 2016-05-24 ENCOUNTER — Encounter

## 2016-05-24 DIAGNOSIS — M5136 Other intervertebral disc degeneration, lumbar region: Secondary | ICD-10-CM

## 2016-05-24 LAB — POCT VENOUS
GFR Non-African American: 35 — AB (ref >60)
POC Creatinine: 1.5 mg/dL — ABNORMAL HIGH (ref 0.3–1.3)

## 2016-05-27 ENCOUNTER — Ambulatory Visit
Admit: 2016-05-27 | Discharge: 2016-05-27 | Payer: BLUE CROSS/BLUE SHIELD | Attending: Professional | Primary: Emergency Medicine

## 2016-05-27 DIAGNOSIS — F319 Bipolar disorder, unspecified: Secondary | ICD-10-CM
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
## 2016-07-04 ENCOUNTER — Inpatient Hospital Stay
Admit: 2016-07-04 | Discharge: 2016-07-04 | Payer: BLUE CROSS/BLUE SHIELD | Attending: Family | Primary: Emergency Medicine

## 2016-07-04 DIAGNOSIS — M5116 Intervertebral disc disorders with radiculopathy, lumbar region: Secondary | ICD-10-CM

## 2016-07-04 NOTE — Progress Notes (Signed)
[facility]/[place]  Patient Pain Assessment  Progress Note       Chief Complaint   Patient presents with   . Lower Back Pain     radiates into right leg     Pain Assessment  Pain Assessment: 0-10  Pain Level: 6  Pain Type: Chronic pain  Pain Location: Back   Pain Orientation: Lower  Pain Radiating Towards: into right leg  Pain Descriptors: Aching, Throbbing, Radiating, Tingling, Numbness  Pain Frequency: Intermittent  Pain Onset: On-going  Clinical Progression: Not changed  Effect of Pain on Daily Activities: limits activity      [x]   Issues of Concern:     Reviewed recent Lumbar MRI with patient. 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 Sandria Manly and scheduled next LESI at L5-S1 level.     [x]   Reports current medication is helping, but continues to have on-going pain    [x]   Reports current pain medication increases ability to do activities of daily living     []   No current narcotic pain medications      [x]   Discussed possible medication side effects, risk of tolerance and/or dependence, alternative treatments    [x]   Encouraged to set goals of decreasing daily narcotic intake    [x]   Discussed effects of long term narcotic use    [x]   Reviewed pain management contract     [x]   Injection options discussed and repeat scheduled for next visit     [] Yes [x] No  Current medication side effects     [] Yes [x] No   Acute bladder or bowel changes    Previous Procedure / Percentage of pain control / Imaging / PT History:  Percentage of Pain Relief after Last Procedure:  100 %    How long lasted:  7 weeks and "still better"    Radiology exams received during the last 12 months: No  MRI exams received in the past 2 years: Yes  Physical therapy during the last 6 months: No     BMI: Body mass index is 35.61 kg/(m^2).    [x]   Nutrient rich, low fat, low carbohydrate diet discussed   [x]   Positive effect of weight management on pain control discussed    Activity:   [x]  Exercise discussed as  beneficial to pain reduction, encouraged stretching exercises and to set daily goals    Tobacco use:    [x]  Never    Social History     Social History   . Marital status: Married     Spouse name: Johnnie   . Number of children: 2   . Years of education: 74     Social History Main Topics   . Smoking status: Never Smoker   . Smokeless tobacco: Never Used      Comment: Disabled due bipolar/autism   . Alcohol use No   . Drug use: No   . Sexual activity: Not Currently     Partners: Male     Other Topics Concern   . None     Social History Narrative   . None                                                            Past Medical History:       Diagnosis  Date   . Abdominal pain, right lower quadrant    . 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 Arthritis    . Bipolar disorder (HCC)    . Blood circulation, collateral    . Cancer (HCC)     skin cancer   . Diabetes mellitus (HCC)    . Hearing aid worn     bilateral   . Hernia    . Hyperlipidemia    . MVP (mitral valve prolapse)    . Neuromuscular disorder (HCC) Inguinal Nerve pain   . Pleurisy without mention of effusion or current tuberculosis    . Right groin pain 02/28/2014   . Stomach ulcer     as a child   . Torn rotator cuff     right shoulder   . Unspecified asthma(493.90)    . Unspecified constipation    . Unspecified hemorrhoids without mention of complication      Surgical History:  Past Surgical History:   Procedure Laterality Date   . ABDOMEN SURGERY     . ABDOMINAL EXPLORATION SURGERY      times two due to adhesions   . APPENDECTOMY     . CHOLECYSTECTOMY, LAPAROSCOPIC N/A 02/24/2015    CHOLECYSTECTOMY LAP[facility]COPIC [facility]Lindsey J [facility]   . CHOLECYSTECTOMY, LAPAROSCOPIC     . COLON SURGERY  04/2013    1 polyp removed   . COLONOSCOPY  05/19/11    Dr Renato Gailseed   . COLONOSCOPY  07/15/2012    Dr. Zollie BeckersHendon   . COLONOSCOPY  2016   . HYSTERECTOMY     . ROTATOR CUFF REPAIR Right    . SKIN BIOPSY  L and R wrists   . TONSILLECTOMY     . TUBAL LIGATION      . WISDOM TOOTH EXTRACTION       Family History:  family history includes Alcohol Abuse in her brother, father, and mother; Cancer in her paternal grandfather; Hypertension in her maternal grandmother and mother; Mental Illness in her daughter, father, maternal grandmother, and mother.     Allergies:  Tobacco [nicotiana tabacum]; Decongestant [pseudoephedrine hcl]; Motrin [ibuprofen]; and Cabbage     Medications:  Current Outpatient Prescriptions   Medication Sig Dispense Refill   . traMADol (ULTRAM) 50 MG tablet Take 50 mg by mouth 2 times daily as needed for Pain..     . traZODone (DESYREL) 100 MG tablet Take 100 mg by mouth nightly     . QUEtiapine (SEROQUEL) 200 MG tablet Take 200 mg by mouth nightly     . lamoTRIgine (LAMICTAL) 100 MG tablet Take 1 tablet by mouth nightly 30 tablet 1   . insulin lispro (HUMALOG) 100 UNIT/ML injection vial Inject 0-6 Units into the skin 3 times daily (with meals) 1 vial 3   . insulin glargine (LANTUS) 100 UNIT/ML injection vial Inject 25 Units into the skin daily 1 vial 3   . OLANZapine (ZYPREXA) 10 MG tablet Take 1 tablet by mouth nightly 30 tablet 3   . diclofenac sodium 1 % GEL Apply 2 g topically daily as needed for Pain     . Cholecalciferol (VITAMIN D3) 50000 units CAPS Take 50,000 Units by mouth     . acetaminophen (TYLENOL) 325 MG tablet Take 2 tablets by mouth every 4 hours as needed for Pain 120 tablet 3   . albuterol sulfate HFA (VENTOLIN HFA) 108 (90 Base) MCG/ACT inhaler Inhale 2 puffs into  the lungs every 4 hours as needed for Wheezing 1 Inhaler 3   . famotidine (PEPCID) 20 MG tablet Take 1 tablet by mouth 2 times daily 60 tablet 3   . Lancets MISC      . NONFORMULARY Arthritis hand ointment     . montelukast (SINGULAIR) 10 MG tablet Take 10 mg by mouth nightly     . buPROPion SR (WELLBUTRIN SR) 150 MG SR tablet Take 1 tablet by mouth daily 60 tablet 3     No current facility-administered medications for this encounter.      UDS:     [x]  Reviewed and monitoring,  see labs         Vitals:  BP 123/74  Pulse 95  Temp 97.7 F (36.5 C) (Temporal)   Resp 16  Ht 5\' 5"  (1.651 m)  Wt 214 lb (97.1 kg)  SpO2 94%  BMI 35.61 kg/m2     Physical Exam:  General appearance: no acute distress   Head: NCAT, EOMI  Skin: Warm, Dry   Musculoskeletal: ambulatory per self, steady gait  Neurologic: alert and oriented X 3, speech clear  Mood and affect: appropriate, no SI or HI    Assessment:    *      Lumbar DDD  *      Lumbar Radiculopathy    Plan:   [x]   Patient is to call with any questions or concerns which may arise prior to the next office visit    [x]   Continue current medications per our office, see medication tab, KASPER reviewed   []   Add...   []   Discontinue...   []   Imaging order given to patient   []   PT order given to patient   [x]   Procedure scheduled for next visit, (LESI L5-S1)   []   ...    Controlled Substance Monitoring: Discussed with patient possible medication side effects, risk of tolerance and/or dependence, and alternative treatments. Discussed the effects of long term narcotic use. Patient encouraged to set daily goals of exercising and decreasing daily narcotic intake.      Electronically signed by Donnetta Hail, APRN

## 2016-07-04 NOTE — Progress Notes (Signed)
Nursing Admission Record    Current Issues / Falls / ER Visits:  No    Percentage of Pain Relief after Last Procedure:  100 %    How long lasted:  7 weeks still better    Radiology exams received during the last 12 months: No       When  na                                              [place]       Imaging on chart: No         Imaging records requested: No  MRI exams received in the past 2 years:  Yes  Physical therapy during the last 6 months: No       When: na                                             Where na  Labs during the last 12 months: Yes    Education Provided:  [x]  Review of [place]  []  Agreement Review  []  Compliance Issues Discussed    []  Cognitive Behavior Needs [x]  Exercise []  Review of Test []  Financial Issues  []  Tobacco/Alcohol Use [x]  Teaching []  New Patient []  Picture Obtained    Physician Plan:  []  Outgoing Referral  []  Pharmacy Consult  []  Test Ordered   []  Obtained Test Results / Consult Notes  []  UDS due at next visit, verified per EPIC      []  Suspected Physical Abuse or Suicide Risk assessed - IF YES COMPLETE QUESTIONS BELOW    If any of the following questions are answered yes - contact attending physician for referral:    Has been considering harming self to escape stress, pain problems?  []  YES  [x]  NO  Has a suicide plan? []  YES  [x]  NO  Has attempted suicide in the past?   []  YES  [x]  NO  Has a close friend or family member who committed suicide?  []  YES  [x]  NO    Patient Referred To :     Additional Notes:    Assessment Completed by:  Electronically signed by Laneta SimmersLeslie Avyonna Wagoner, RN on 07/04/2016 at 1:12 PM

## 2016-07-05 LAB — PAIN MANAGEMENT PROFILE (13 DRUGS), URINE
Amphetamines, urine: NEGATIVE ng/mL
Barbiturates, Urine: NEGATIVE ng/mL
Benzodiazepines, Urine: NEGATIVE ng/mL
Cannabinoids, Urine: NEGATIVE ng/mL
Cocaine Metabolite, Urine: NEGATIVE ng/mL
Creatinine, Urine: 105.9 mg/dL (ref 20.0–300.0)
Ethanol U, Quan: NEGATIVE %
Fentanyl, Ur: NEGATIVE pg/mL
Meperidine, Ur: NEGATIVE ng/mL
Methadone Screen, Urine: NEGATIVE ng/mL
Opiates, Urine: NEGATIVE ng/mL
Oxycodone/Oxymorphone, Ur: NEGATIVE ng/mL
Phencyclidine, Urine: NEGATIVE ng/mL
Propoxyphene, Urine: NEGATIVE ng/mL
pH, Urine: 6.1 (ref 4.5–8.9)

## 2016-07-10 ENCOUNTER — Ambulatory Visit
Admit: 2016-07-10 | Discharge: 2016-07-10 | Payer: BLUE CROSS/BLUE SHIELD | Attending: Psychiatric/Mental Health | Primary: Emergency Medicine

## 2016-07-10 DIAGNOSIS — F319 Bipolar disorder, unspecified: Secondary | ICD-10-CM

## 2016-07-10 MED ORDER — MIRTAZAPINE 7.5 MG PO TABS
7.5 MG | ORAL_TABLET | Freq: Every evening | ORAL | 3 refills | Status: DC
Start: 2016-07-10 — End: 2016-07-25

## 2016-07-10 MED ORDER — OLANZAPINE 10 MG PO TABS
10 | ORAL_TABLET | Freq: Every evening | ORAL | 3 refills | Status: DC
Start: 2016-07-10 — End: 2017-05-13

## 2016-07-10 MED ORDER — LAMOTRIGINE 100 MG PO TABS
100 | ORAL_TABLET | Freq: Every evening | ORAL | 1 refills | Status: DC
Start: 2016-07-10 — End: 2016-08-28
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
## 2016-07-16 ENCOUNTER — Inpatient Hospital Stay
Admission: EM | Admit: 2016-07-16 | Discharge: 2016-07-18 | Disposition: A | Discharge status: Home / Self Care | Payer: BLUE CROSS/BLUE SHIELD | Source: Other Acute Inpatient Hospital | Admitting: Psychiatry

## 2016-07-16 DIAGNOSIS — F3110 Bipolar disorder, current episode manic without psychotic features, unspecified: Principal | ICD-10-CM

## 2016-07-16 LAB — CBC
Hematocrit: 49.1 % — ABNORMAL HIGH (ref 37.0–47.0)
Hemoglobin: 15.7 g/dL (ref 12.0–16.0)
MCH: 28.5 pg (ref 27.0–31.0)
MCHC: 32 g/dL — ABNORMAL LOW (ref 33.0–37.0)
MCV: 89.3 fL (ref 81.0–99.0)
MPV: 10.4 fL (ref 9.4–12.3)
Platelets: 253 10*3/uL (ref 130–400)
RBC: 5.5 M/uL — ABNORMAL HIGH (ref 4.20–5.40)
RDW: 13 % (ref 11.5–14.5)
WBC: 12 10*3/uL — ABNORMAL HIGH (ref 4.8–10.8)

## 2016-07-16 LAB — URINALYSIS
Bilirubin Urine: NEGATIVE
Blood, Urine: NEGATIVE
Glucose, Ur: NEGATIVE mg/dL
Ketones, Urine: NEGATIVE mg/dL
Nitrite, Urine: NEGATIVE
Protein, UA: NEGATIVE mg/dL
Specific Gravity, UA: 1.013 (ref 1.005–1.030)
Urobilinogen, Urine: 0.2 E.U./dL (ref <2.0)
pH, UA: 6 (ref 5.0–8.0)

## 2016-07-16 LAB — COMPREHENSIVE METABOLIC PANEL
ALT: 30 U/L (ref 5–33)
AST: 21 U/L (ref 5–32)
Albumin: 4.7 g/dL (ref 3.5–5.2)
Alkaline Phosphatase: 53 U/L (ref 35–104)
Anion Gap: 13 mmol/L (ref 7–19)
BUN: 22 mg/dL (ref 8–23)
CO2: 25 mmol/L (ref 22–29)
Calcium: 9.9 mg/dL (ref 8.8–10.2)
Chloride: 104 mmol/L (ref 98–111)
Creatinine: 1.5 mg/dL — ABNORMAL HIGH (ref 0.5–0.9)
GFR Non-African American: 35 — AB (ref >60)
Glucose: 151 mg/dL — ABNORMAL HIGH (ref 74–109)
Potassium: 4.1 mmol/L (ref 3.5–5.0)
Sodium: 142 mmol/L (ref 136–145)
Total Bilirubin: 0.3 mg/dL (ref 0.2–1.2)
Total Protein: 7.6 g/dL (ref 6.6–8.7)

## 2016-07-16 LAB — ETHANOL: Ethanol Lvl: <10 mg/dL

## 2016-07-16 LAB — MICROSCOPIC URINALYSIS

## 2016-07-16 LAB — URINE DRUG SCREEN
Amphetamine Screen, Urine: NEGATIVE (ref <1000)
Barbiturate Screen, Ur: NEGATIVE (ref <200)
Benzodiazepine Screen, Urine: NEGATIVE (ref <100)
Cannabinoid Scrn, Ur: NEGATIVE (ref <50)
Cocaine Metabolite Screen, Urine: NEGATIVE (ref <300)
Opiate Scrn, Ur: NEGATIVE (ref <300)

## 2016-07-16 MED ORDER — SODIUM CHLORIDE 0.9 % IV BOLUS
0.9 % | Freq: Once | INTRAVENOUS | Status: AC
Start: 2016-07-16 — End: 2016-07-16
  Administered 2016-07-16: 23:00:00 1000 mL via INTRAVENOUS
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
## 2016-07-16 NOTE — Behavioral Health Treatment Team (Addendum)
[facility]  Admission Note     Admission Type:   Admission Type: Voluntary    Reason for admission:  Reason for Admission: patient received steroids from PCP for a rash and became very manic.  patient tried to cut herself and husband took away knives.  patient then began biting and hitting herself.    PATIENT STRENGTHS:  Strengths: Communication, Positive Support, Medication Compliance    Patient Strengths and Limitations:  Limitations: External locus of control    Addictive Behavior:   Addictive Behavior  In the past 3 months, have you felt or has someone told you that you have a problem with:  : Other(Comments)  Do you have a history of Chemical Use?: No  Do you have a history of Alcohol Use?: No  Do you have a history of Street Drug Abuse?: No  Histroy of Prescripton Drug Abuse?: No    Medical Problems:   Past Medical History:   Diagnosis Date   . Abdominal pain, right lower quadrant    . 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 Arthritis    . Bipolar disorder (HCC)    . Blood circulation, collateral    . Cancer (HCC)     skin cancer   . Diabetes mellitus (HCC)    . Hearing aid worn     bilateral   . Hernia    . Hyperlipidemia    . MVP (mitral valve prolapse)    . Neuromuscular disorder (HCC) Inguinal Nerve pain   . Pleurisy without mention of effusion or current tuberculosis    . Right groin pain 02/28/2014   . Stomach ulcer     as a child   . Torn rotator cuff     right shoulder   . Unspecified asthma(493.90)    . Unspecified constipation    . Unspecified hemorrhoids without mention of complication        Status EXAM:  Status and Exam  Normal: Yes  Facial Expression: Worried  Affect: Appropriate  Level of Consciousness: Alert  Mood:Normal: Yes  Motor Activity:Normal: Yes  Interview Behavior: Cooperative  Preception: Orient to Person, Orient to Time, Orient to Place, Orient to Situation  Attention:Normal: Yes  Thought Processes: Circumstantial  Thought Content:Normal:  Yes  Hallucinations: Auditory (Comment) (patient thinks that she heard music during her manic episode )  Delusions: No  Memory:Normal: Yes  Insight and Judgment: No  Insight and Judgment: Poor Judgment  Present Suicidal Ideation: No  Present Homicidal Ideation: No    Tobacco Screening:  Practical Counseling, on admission, mark X, if applicable and completed (first 3 are required if patient doesn't refuse):            ( )  Recognizing danger situations (included triggers and roadblocks)                    ( )  Coping skills (new ways to manage stress, exercise, relaxation techniques, changing routine, distraction)                                                           ( )  Basic information about quitting (benefits of quitting, techniques in how to quit, available resources  ( ) Referral for counseling faxed to [facility]                                           ( ) Patient refused counseling  (X) Patient does not smoke    Metabolic Screening:    Lab Results   Component Value Date    LABA1C 7.0 (H) 02/15/2016       No results for input(s): CHOL, TRIG, HDL, LDLCALC, LABVLDL in the last 72 hours.    Body mass index is 34.99 kg/m.    BP Readings from Last 2 Encounters:   07/16/16 (!) 113/50   07/10/16 127/82           Pt admitted with followings belongings:  Dentures: None  Vision - Corrective Lenses: None  Hearing Aid: None  Jewelry: Other (Comment) (see belongings document)  Body Piercings Removed: N/A  Clothing: Other (Comment) (see belongings document)  Were All Patient Medications Collected?: Not Applicable  Other Valuables: Other (Comment) (see belongings document)     Patient arrived to unit via wheelchair, accompanied by a tech and security.  Patient dressed appropriately in paper scrubs.  All lines and tubes removed in the ER.  Patient and belongings searched per protocol.  No contraband noted.     Valuables sent home with Lina Sar, patient's husband. Patient's home medications were not  brought in to the hospital.  Patient oriented to surroundings and program expectations and copy of patient rights given. Received admission packet.  Consents reviewed, signed, and witnessed. Patient verbalize understanding of unit policies and procedures.  Patient education on fall and safety precautions.                 Michaele Offer, RN

## 2016-07-17 LAB — POCT GLUCOSE
POC Glucose: 114 mg/dl — ABNORMAL HIGH (ref 70–99)
POC Glucose: 96 mg/dl (ref 70–99)
POC Glucose: 97 mg/dl (ref 70–99)
POC Glucose: 107 mg/dl — ABNORMAL HIGH (ref 70–99)

## 2016-07-17 MED ORDER — FENOFIBRATE 160 MG PO TABS
160 | Freq: Every day | ORAL | Status: DC
Start: 2016-07-17 — End: 2016-07-18
  Administered 2016-07-18: 12:00:00 160 mg via ORAL

## 2016-07-17 MED ORDER — LUBIPROSTONE 24 MCG PO CAPS
24 MCG | Freq: Every day | ORAL | Status: DC | PRN
Start: 2016-07-17 — End: 2016-07-18

## 2016-07-17 MED ORDER — DICLOFENAC SODIUM 1 % TD GEL
1 % | Freq: Every day | TRANSDERMAL | Status: DC | PRN
Start: 2016-07-17 — End: 2016-07-18

## 2016-07-17 MED ORDER — INSULIN GLARGINE 100 UNIT/ML SC SOLN
100 UNIT/ML | Freq: Every day | SUBCUTANEOUS | Status: DC
Start: 2016-07-17 — End: 2016-07-18
  Administered 2016-07-18: 12:00:00 25 [IU] via SUBCUTANEOUS

## 2016-07-17 MED ORDER — BUPROPION HCL ER (XL) 150 MG PO TB24
150 MG | Freq: Every morning | ORAL | Status: DC
Start: 2016-07-17 — End: 2016-07-18
  Administered 2016-07-18: 12:00:00 150 mg via ORAL

## 2016-07-17 MED ORDER — INSULIN LISPRO 100 UNIT/ML SC SOLN
100 UNIT/ML | Freq: Four times a day (QID) | SUBCUTANEOUS | Status: DC
Start: 2016-07-17 — End: 2016-07-18
  Administered 2016-07-18: 01:00:00 1 [IU] via SUBCUTANEOUS

## 2016-07-17 MED ORDER — LAMOTRIGINE 100 MG PO TABS
100 MG | Freq: Two times a day (BID) | ORAL | Status: DC
Start: 2016-07-17 — End: 2016-07-18
  Administered 2016-07-18 (×2): 100 mg via ORAL

## 2016-07-17 MED ORDER — TRAZODONE HCL 100 MG PO TABS
100 MG | Freq: Once | ORAL | Status: AC
Start: 2016-07-17 — End: 2016-07-16
  Administered 2016-07-17: 03:00:00 100 mg via ORAL

## 2016-07-17 MED ORDER — FAMOTIDINE 20 MG PO TABS
20 MG | Freq: Once | ORAL | Status: AC
Start: 2016-07-17 — End: 2016-07-16
  Administered 2016-07-17: 03:00:00 20 mg via ORAL

## 2016-07-17 MED ORDER — MIRTAZAPINE 7.5 MG PO TABS
7.5 MG | Freq: Every evening | ORAL | Status: DC
Start: 2016-07-17 — End: 2016-07-18
  Administered 2016-07-18: 01:00:00 7.5 mg via ORAL

## 2016-07-17 MED ORDER — FENOFIBRATE MICRONIZED 200 MG PO CAPS
200 MG | Freq: Every day | ORAL | Status: DC
Start: 2016-07-17 — End: 2016-07-17

## 2016-07-17 MED ORDER — MONTELUKAST SODIUM 10 MG PO TABS
10 MG | Freq: Every evening | ORAL | Status: DC
Start: 2016-07-17 — End: 2016-07-18
  Administered 2016-07-18: 01:00:00 10 mg via ORAL

## 2016-07-17 MED ORDER — GLUCOSE 40 % PO GEL
40 | ORAL | Status: DC | PRN
Start: 2016-07-17 — End: 2016-07-18

## 2016-07-17 MED ORDER — FAMOTIDINE 20 MG PO TABS
20 MG | Freq: Two times a day (BID) | ORAL | Status: DC
Start: 2016-07-17 — End: 2016-07-18
  Administered 2016-07-18 (×2): 20 mg via ORAL

## 2016-07-17 MED ORDER — TRAMADOL HCL 50 MG PO TABS
50 MG | Freq: Two times a day (BID) | ORAL | Status: DC | PRN
Start: 2016-07-17 — End: 2016-07-18

## 2016-07-17 MED ORDER — OLANZAPINE 5 MG PO TABS
5 MG | Freq: Once | ORAL | Status: AC
Start: 2016-07-17 — End: 2016-07-16
  Administered 2016-07-17: 03:00:00 5 mg via ORAL

## 2016-07-17 MED ORDER — DEXTROSE 50 % IV SOLN
50 % | INTRAVENOUS | Status: DC | PRN
Start: 2016-07-17 — End: 2016-07-16

## 2016-07-17 MED ORDER — ALBUTEROL SULFATE HFA 108 (90 BASE) MCG/ACT IN AERS
108 (90 Base) MCG/ACT | RESPIRATORY_TRACT | Status: DC | PRN
Start: 2016-07-17 — End: 2016-07-18

## 2016-07-17 MED ORDER — CHOLECALCIFEROL 125 MCG (5000 UT) PO CAPS
125 MCG (5000 UT) | ORAL | Status: DC
Start: 2016-07-17 — End: 2016-07-18

## 2016-07-17 MED ORDER — DEXTROSE 5 % IV SOLN
5 % | INTRAVENOUS | Status: DC | PRN
Start: 2016-07-17 — End: 2016-07-16

## 2016-07-17 MED ORDER — LAMOTRIGINE 100 MG PO TABS
100 MG | Freq: Every evening | ORAL | Status: DC
Start: 2016-07-17 — End: 2016-07-17

## 2016-07-17 MED ORDER — ACETAMINOPHEN 325 MG PO TABS
325 MG | ORAL | Status: DC | PRN
Start: 2016-07-17 — End: 2016-07-18

## 2016-07-17 MED ORDER — GLUCAGON HCL RDNA (DIAGNOSTIC) 1 MG IJ SOLR
1 MG | INTRAMUSCULAR | Status: DC | PRN
Start: 2016-07-17 — End: 2016-07-18

## 2016-07-17 MED ORDER — INSULIN LISPRO 100 UNIT/ML SC SOLN
100 UNIT/ML | Freq: Three times a day (TID) | SUBCUTANEOUS | Status: DC
Start: 2016-07-17 — End: 2016-07-17

## 2016-07-17 MED ORDER — OLANZAPINE 10 MG PO TABS
10 MG | Freq: Every evening | ORAL | Status: DC
Start: 2016-07-17 — End: 2016-07-18
  Administered 2016-07-18: 01:00:00 10 mg via ORAL

## 2016-07-17 MED FILL — OLANZAPINE 5 MG PO TABS: 5 MG | ORAL | Qty: 1

## 2016-07-17 MED FILL — TRAZODONE HCL 100 MG PO TABS: 100 MG | ORAL | Qty: 1

## 2016-07-17 MED FILL — VOLTAREN 1 % TD GEL: 1 % | TRANSDERMAL | Qty: 100

## 2016-07-17 MED FILL — HUMALOG 100 UNIT/ML SC SOLN: 100 UNIT/ML | SUBCUTANEOUS | Qty: 0.01

## 2016-07-17 MED FILL — FAMOTIDINE 20 MG PO TABS: 20 MG | ORAL | Qty: 1

## 2016-07-17 MED FILL — LANTUS 100 UNIT/ML SC SOLN: 100 UNIT/ML | SUBCUTANEOUS | Qty: 0.25

## 2016-07-17 MED FILL — TRIGLIDE 160 MG PO TABS: 160 MG | ORAL | Qty: 1

## 2016-07-17 MED FILL — VENTOLIN HFA 108 (90 BASE) MCG/ACT IN AERS: 108 (90 Base) MCG/ACT | RESPIRATORY_TRACT | Qty: 8

## 2016-07-17 NOTE — Behavioral Health Treatment Team (Signed)
Group Therapy Note    Date: 07/17/16  Start Time: 0830  End Time:0900    Number of Participants: 5    Type of Group: Unit rules / Patient goals     Patient's Goal:  Go to groups     Notes:      Status After Intervention:  Unchanged    Participation Level: Minimal    Participation Quality: Resistant    Speech:  normal    Thought Process/Content: Logical    Affective Functioning: Flat    Mood: depressed    Level of consciousness:  Oriented x4    Response to Learning: Resistant    Modes of Intervention: Education and Support    Discipline Responsible: Registered Nurse    Signature:  Linden DolinPeggy S Casper, RN

## 2016-07-17 NOTE — Progress Notes (Signed)
Wrap up:    Patient effectively participated in evening "wrap up" group by practicing self-evaluation skills and communicating observations made regarding their own behavior throughout the day.

## 2016-07-17 NOTE — Progress Notes (Signed)
CSSR-S completed on this date.    Electronically signed by Brett FairyAmanda Antoria Lanza, CSW on 07/17/2016 at 1:31 PM

## 2016-07-17 NOTE — Plan of Care (Signed)
Problem: Altered Mood, Manic Behavior:  Goal: STG-Knowledge of positive coping patterns  Outcome: Ongoing                                                                      Group Therapy Note    Date: 07/17/2016  Start Time: 1100  End Time:  1200  Number of Participants: 2    Type of Group: Geophysicist/field seismologistCognitive Skills    Wellness Binder Information  Module Name:  Women's Issues  Session Number:  4    Patient's Goal:  To raise awareness of how thoughts influence feelings    Notes:  Pt demonstrated improved awareness of how thoughts influence feelings by actively participating in group discussion.    Status After Intervention:  Improved    Participation Level: Interactive    Participation Quality: Attentive      Speech:  normal      Thought Process/Content: Logical      Affective Functioning: Congruent      Mood: anxious and depressed      Level of consciousness:  Alert and Oriented x4      Response to Learning: Able to verbalize current knowledge/experience, Able to verbalize/acknowledge new learning and Progressing to goal      Endings: None Reported    Modes of Intervention: Education      Discipline Responsible: Psychoeducational Specialist      Signature:  Clydell HakimPatricia G Stark

## 2016-07-17 NOTE — Plan of Care (Signed)
Problem: Health Behavior:  Goal: Ability to verbalize adaptive coping strategies to use when suicidal feelings occur will improve  Ability to verbalize adaptive coping strategies to use when suicidal feelings occur will improve   Outcome: Ongoing    Goal: Ability to verbalize adaptive coping strategies to use when the urge to self-mutilate occurs will improve  Ability to verbalize adaptive coping strategies to use when the urge to self-mutilate occurs will improve   Outcome: Ongoing      Problem: Safety:  Goal: Ability to contract for his/her safety will improve  Ability to contract for his/her safety will improve   Outcome: Ongoing      Problem: Falls - Risk of:  Goal: Will remain free from falls  Will remain free from falls   Outcome: Ongoing    Goal: Absence of physical injury  Absence of physical injury   Outcome: Ongoing      Problem: Altered Mood, Manic Behavior:  Goal: Able to sleep  Able to sleep   Outcome: Ongoing    Goal: Able to verbalize decrease in frequency and intensity of racing thoughts  Able to verbalize decrease in frequency and intensity of racing thoughts   Outcome: Ongoing    Goal: Ability to disclose and discuss suicidal ideas will improve  Ability to disclose and discuss suicidal ideas will improve   Outcome: Ongoing    Goal: Absence of self-harm  Absence of self-harm   Outcome: Ongoing    Goal: Ability to achieve adequate nutritional intake will improve  Ability to achieve adequate nutritional intake will improve   Outcome: Ongoing    Goal: Ability to interact with others will improve  Ability to interact with others will improve   Outcome: Ongoing    Goal: Ability to demonstrate self-control will improve  Ability to demonstrate self-control will improve   Outcome: Ongoing    Goal: Mood stable  Mood stable   Outcome: Ongoing    Goal: STG-Knowledge of positive coping patterns  Outcome: Ongoing    Goal: LTG-Able to verbalize acceptance of life and situations over which he or she has no  control  Outcome: Ongoing

## 2016-07-17 NOTE — Progress Notes (Signed)
RT leisure assessment is complete.  Pt will be encouraged to attend scheduled group activities to address leisure and social deficits.
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
## 2016-07-17 NOTE — Plan of Care (Signed)
Problem: Altered Mood, Manic Behavior:  Goal: STG-Knowledge of positive coping patterns  Outcome: Ongoing                                                                      Group Therapy Note    Date: 07/17/2016  Start Time: 1000  End Time:  1100  Number of Participants: 3    Type of Group: Psychoeducation    Wellness Binder Information  Module Name:  Emotional Wellness  Session Number:  1    Patient's Goal:  To raise awareness of the importance of healthy expression of emotions    Notes:  Pt demonstrated improved awareness of the importance of healthy expression of emotions by actively participating in group activity.    Status After Intervention:  Improved    Participation Level: Interactive    Participation Quality: Attentive      Speech:  normal      Thought Process/Content: Logical      Affective Functioning: Congruent      Mood: anxious and depressed      Level of consciousness:  Alert and Oriented x4      Response to Learning: Able to verbalize current knowledge/experience, Able to verbalize/acknowledge new learning and Progressing to goal      Endings: None Reported    Modes of Intervention: Education      Discipline Responsible: Psychoeducational Specialist      Signature:  Clydell HakimPatricia G Stark

## 2016-07-17 NOTE — H&P (Signed)
History and Physical      CHIEF COMPLAINT:  Mania, SI    Reason for Admission:  Mania, SI    History Obtained From:  patient    HISTORY OF PRESENT ILLNESS:      The patient is a 61 y.o. female who was admitted to [facility] unit with worsening mood issues. She has no c/o CP or SOA. No abdominal pain or N/V. No dysuria. No recent fevers. No HA or dizziness. She has c/o low back pain, that is chronic.   Past Medical History:        Diagnosis Date   . Abdominal pain, right lower quadrant    . 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 Arthritis    . Bipolar disorder (HCC)    . Blood circulation, collateral    . Cancer (HCC)     skin cancer   . Diabetes mellitus (HCC)    . Hearing aid worn     bilateral   . Hernia    . Hyperlipidemia    . MVP (mitral valve prolapse)    . Neuromuscular disorder (HCC) Inguinal Nerve pain   . Pleurisy without mention of effusion or current tuberculosis    . Right groin pain 02/28/2014   . Stomach ulcer     as a child   . Torn rotator cuff     right shoulder   . Unspecified asthma(493.90)    . Unspecified constipation    . Unspecified hemorrhoids without mention of complication      Past Surgical History:        Procedure Laterality Date   . ABDOMEN SURGERY     . ABDOMINAL EXPLORATION SURGERY      times two due to adhesions   . APPENDECTOMY     . CHOLECYSTECTOMY, LAPAROSCOPIC N/A 02/24/2015    CHOLECYSTECTOMY LAPAROSCOPIC performed by Gabriel Rainwater, MD at [facility] OR   . CHOLECYSTECTOMY, LAPAROSCOPIC     . COLON SURGERY  04/2013    1 polyp removed   . COLONOSCOPY  05/19/11    Dr Renato Gails   . COLONOSCOPY  07/15/2012    Dr. Zollie Beckers   . COLONOSCOPY  2016   . HYSTERECTOMY     . ROTATOR CUFF REPAIR Right    . SKIN BIOPSY  L and R wrists   . TONSILLECTOMY     . TUBAL LIGATION     . WISDOM TOOTH EXTRACTION           Medications Prior to Admission:    Prescriptions Prior to Admission: hydrOXYzine (VISTARIL) 25 MG capsule, Take 25 mg by mouth 3 times daily as needed for Itching  buPROPion (WELLBUTRIN XL)  150 MG extended release tablet, Take 150 mg by mouth every morning  lubiprostone (AMITIZA) 24 MCG capsule, Take 24 mcg by mouth daily (with breakfast)  fenofibrate micronized (LOFIBRA) 200 MG capsule, Take 200 mg by mouth every morning (before breakfast)  mirtazapine (REMERON) 7.5 MG tablet, Take 1 tablet by mouth nightly  lamoTRIgine (LAMICTAL) 100 MG tablet, Take 1 tablet by mouth nightly (Patient taking differently: Take 100 mg by mouth 2 times daily )  OLANZapine (ZYPREXA) 10 MG tablet, Take 1 tablet by mouth nightly (Patient taking differently: Take 10 mg by mouth nightly )  traMADol (ULTRAM) 50 MG tablet, Take 50 mg by mouth 2 times daily as needed for Pain..  traZODone (DESYREL) 100 MG tablet, Take 100 mg by mouth nightly  insulin lispro (  HUMALOG) 100 UNIT/ML injection vial, Inject 0-6 Units into the skin 3 times daily (with meals)  insulin glargine (LANTUS) 100 UNIT/ML injection vial, Inject 25 Units into the skin daily (Patient taking differently: Inject 40 Units into the skin daily )  diclofenac sodium 1 % GEL, Apply 2 g topically daily as needed for Pain  Cholecalciferol (VITAMIN D3) 50000 units CAPS, Take 50,000 Units by mouth  acetaminophen (TYLENOL) 325 MG tablet, Take 2 tablets by mouth every 4 hours as needed for Pain  albuterol sulfate HFA (VENTOLIN HFA) 108 (90 Base) MCG/ACT inhaler, Inhale 2 puffs into the lungs every 4 hours as needed for Wheezing  famotidine (PEPCID) 20 MG tablet, Take 1 tablet by mouth 2 times daily  Lancets MISC,   NONFORMULARY, Arthritis hand ointment  montelukast (SINGULAIR) 10 MG tablet, Take 10 mg by mouth nightly  buPROPion SR (WELLBUTRIN SR) 150 MG SR tablet, Take 1 tablet by mouth daily    Allergies:  Tobacco [nicotiana tabacum]; Decongestant [pseudoephedrine hcl]; Motrin [ibuprofen]; Predicort [prednisolone]; and Cabbage    Social History:   TOBACCO:   reports that she has never smoked. She has never used smokeless tobacco.  ETOH:   reports that she does not drink  alcohol.  DRUGS:   reports that she does not use drugs.  MARITAL STATUS:  married  OCCUPATION:  Not working  Patient currently lives with family       Family History:       Problem Relation Age of Onset   . Alcohol Abuse Father    . Mental Illness Father    . Alcohol Abuse Mother    . Mental Illness Mother    . Hypertension Mother    . Alcohol Abuse Brother    . Cancer Paternal Grandfather    . Mental Illness Maternal Grandmother    . Hypertension Maternal Grandmother    . Mental Illness Daughter    . Colon Cancer Neg Hx    . Colon Polyps Neg Hx    . Liver Cancer Neg Hx    . Stomach Cancer Neg Hx    . Ulcerative Colitis Neg Hx    . Crohn's Disease Neg Hx    . Liver Disease Neg Hx    . Rectal Cancer Neg Hx      REVIEW OF SYSTEMS:  Constitutional: neg  CV: neg  Pulmonary: neg  GI: neg  GU: neg  Psych: mania, SI  Neuro: neg  Skin: neg  MusculoSkeletal: back pain  HEENT: neg  Joints: neg  Vitals:  BP (!) 118/59   Pulse 79   Temp 98.4 F (36.9 C) (Temporal)   Resp 16   Ht 5\' 5"  (1.651 m)   Wt 212 lb 9.6 oz (96.4 kg)   LMP  (LMP Unknown)   SpO2 96%   BMI 35.38 kg/m     PHYSICAL EXAM:  Gen: NAD, alert  Neck: no JVD or LAD  Chest: CTA bilat  CV: RRR  Abdomen: NT/ND  Extrem: no C/C/E  Neuro: non focal  Skin: no rashes  Joints: WNL  HEENT: WNL    DATA:  I have reviewed the admission labs and imaging tests.    ASSESSMENT AND PLAN:      Patient Active Hospital Problem List:   Bipolar disorder, SI---follow with Psych   DM2---follow with glucose   Chronic Back Pain--supportive care   Leukocytosis--no s/s of infection   Asthma--no resp issues  Danny Lawless, MD  10:18 PM 07/17/2016

## 2016-07-17 NOTE — Progress Notes (Signed)
Patient has rested quietly this shift.  No distress noted or voiced.

## 2016-07-17 NOTE — Telephone Encounter (Signed)
Pt called and stated she is having some problems she was put on new meds at last ov and she has been really jittery and very irritable and wants to talk to someone about this. Sending to NP for review.

## 2016-07-17 NOTE — Behavioral Health Treatment Team (Signed)
BHI Daily Shift Assessment  Nursing Progress Note    Room: 0619/619-01 Name: Joyce Cohen Age: 61 y.o.    Gender: female   Dx: Manic Behavior Precautions: suicide risk and fall risk  Target Symptoms:    Accu-Chek: Yes 96  Sleep: Yes,Sleep Quality Very good SI no plan AVH denies HI Negative for homicidal ideation    Hours Slept: 7 PRN Sleep Meds: Yes Other PRN Meds: No Med Compliant: Yes Appetite: good Percent Meals: 100% Social: No ADLs: Yes Speech: normal Depression: 0 Anxiety: 3   Participation LevelActive Listener  Visitation: No  Participation QualityAttentive    Complaints: pt denies any complaints at this time . Will continue to monitor     Notes:     Signature: Worthy RancherPeggy Casper, RN

## 2016-07-17 NOTE — Progress Notes (Signed)
BHI Daily Shift Assessment  Nursing Progress Note    Room: 0619/619-01 Name: Joyce Cohen Age: 61 y.o.   Ethnicity: Caucasian Gender: female   Dx: <principal problem not specified>  Precautions: suicide risk and fall risk  CPAP: No Accu-Chek: Yes 107/186  MSE:  Status and Exam  Normal: No  Facial Expression: Brightened  Affect: Appropriate  Level of Consciousness: Alert  Mood:Normal: No  Mood: Depressed, Anxious  Motor Activity:Normal: Yes  Interview Behavior: Cooperative  Preception: Orient to Person, Orient to Time, Orient to Place  Attention:Normal: Yes  Thought Processes: Circumstantial  Thought Content:Normal: Yes  Hallucinations: None  Delusions: No  Memory:Normal: Yes  Insight and Judgment: No  Insight and Judgment: Poor Judgment, Poor Insight  Present Suicidal Ideation: No  Present Homicidal Ideation: No  Sleep: Yes, Good, no sleep issues Hours Slept: 7 Sched Sleep Meds: Yes PRN Sleep Meds: No Other PRN Meds: No Med Compliant: Yes Appetite: good Percent Meals: 100% Social: Yes ADLs: Yes Speech: normal Depression: better Anxiety: better, pt denies SI,HI and AVH at this time, pt social with peers, pt had husband visit, pt participate in groups, pt affect brighter, and behavior is calm and cooperative. Will continue to monitor for safety    Hortencia ConradiLora A Klyde Banka, RN

## 2016-07-17 NOTE — Plan of Care (Signed)
Problem: Health Behavior:  Goal: Ability to verbalize adaptive coping strategies to use when suicidal feelings occur will improve  Ability to verbalize adaptive coping strategies to use when suicidal feelings occur will improve   Outcome: Ongoing    Goal: Ability to verbalize adaptive coping strategies to use when the urge to self-mutilate occurs will improve  Ability to verbalize adaptive coping strategies to use when the urge to self-mutilate occurs will improve   Outcome: Ongoing      Problem: Safety:  Goal: Ability to contract for his/her safety will improve  Ability to contract for his/her safety will improve   Outcome: Met This Shift      Problem: Altered Mood, Manic Behavior:  Goal: Able to sleep  Able to sleep   Outcome: Met This Shift    Goal: Able to verbalize decrease in frequency and intensity of racing thoughts  Able to verbalize decrease in frequency and intensity of racing thoughts   Outcome: Ongoing    Goal: Ability to disclose and discuss suicidal ideas will improve  Ability to disclose and discuss suicidal ideas will improve   Outcome: Ongoing    Goal: Absence of self-harm  Absence of self-harm   Outcome: Met This Shift    Goal: Ability to achieve adequate nutritional intake will improve  Ability to achieve adequate nutritional intake will improve   Outcome: Ongoing    Goal: Ability to interact with others will improve  Ability to interact with others will improve   Outcome: Ongoing    Goal: Ability to demonstrate self-control will improve  Ability to demonstrate self-control will improve   Outcome: Ongoing    Goal: Mood stable  Mood stable   Outcome: Met This Shift    Goal: STG-Knowledge of positive coping patterns  Outcome: Ongoing    Goal: LTG-Able to verbalize acceptance of life and situations over which he or she has no control  Outcome: Ongoing

## 2016-07-17 NOTE — Plan of Care (Signed)
Problem: Altered Mood, Manic Behavior:  Goal: STG-Knowledge of positive coping patterns  Outcome: Ongoing                                                                      Group Therapy Note    Date: 07/17/2016  Start Time: 1330  End Time:  1430  Number of Participants: 3    Type of Group: Recovery    Wellness Binder Information  Module Name:  Men's Issues  Session Number:  4    Patient's Goal:  To raise awareness of the effectiveness of diversionary coping skills    Notes:  Pt demonstrated improved awareness of the effectiveness of diversionary coping skills by actively participating in group discussion.    Status After Intervention:  Improved    Participation Level: Interactive    Participation Quality: Attentive      Speech:  normal      Thought Process/Content: Logical      Affective Functioning: Congruent      Mood: anxious and depressed      Level of consciousness:  Alert and Oriented x4      Response to Learning: Able to verbalize current knowledge/experience, Able to verbalize/acknowledge new learning and Progressing to goal      Endings: None Reported    Modes of Intervention: Education      Discipline Responsible: Psychoeducational Specialist      Signature:  Clydell HakimPatricia G Stark

## 2016-07-18 LAB — BASIC METABOLIC PANEL
Anion Gap: 14 mmol/L (ref 7–19)
BUN: 23 mg/dL (ref 8–23)
CO2: 22 mmol/L (ref 22–29)
Calcium: 9.7 mg/dL (ref 8.8–10.2)
Chloride: 106 mmol/L (ref 98–111)
Creatinine: 1.2 mg/dL — ABNORMAL HIGH (ref 0.5–0.9)
GFR Non-African American: 46 — AB (ref >60)
Glucose: 131 mg/dL — ABNORMAL HIGH (ref 74–109)
Potassium: 4.4 mmol/L (ref 3.5–5.0)
Sodium: 142 mmol/L (ref 136–145)

## 2016-07-18 LAB — POCT GLUCOSE
POC Glucose: 122 mg/dl — ABNORMAL HIGH (ref 70–99)
POC Glucose: 126 mg/dl — ABNORMAL HIGH (ref 70–99)
POC Glucose: 186 mg/dl — ABNORMAL HIGH (ref 70–99)

## 2016-07-18 LAB — VITAMIN B12: Vitamin B-12: 598 pg/mL (ref 211–946)

## 2016-07-18 LAB — TSH: TSH: 1.04 u[IU]/mL (ref 0.270–4.200)

## 2016-07-18 LAB — VITAMIN D 25 HYDROXY: Vit D, 25-Hydroxy: 39.1 ng/mL (ref >=30)

## 2016-07-18 MED FILL — FAMOTIDINE 20 MG PO TABS: 20 MG | ORAL | Qty: 1

## 2016-07-18 MED FILL — TRIGLIDE 160 MG PO TABS: 160 MG | ORAL | Qty: 1

## 2016-07-18 MED FILL — LAMOTRIGINE 100 MG PO TABS: 100 MG | ORAL | Qty: 1

## 2016-07-18 MED FILL — LANTUS 100 UNIT/ML SC SOLN: 100 UNIT/ML | SUBCUTANEOUS | Qty: 0.25

## 2016-07-18 MED FILL — MONTELUKAST SODIUM 10 MG PO TABS: 10 MG | ORAL | Qty: 1

## 2016-07-18 MED FILL — OLANZAPINE 10 MG PO TABS: 10 MG | ORAL | Qty: 1

## 2016-07-18 MED FILL — MIRTAZAPINE 7.5 MG PO TABS: 7.5 MG | ORAL | Qty: 1

## 2016-07-18 MED FILL — BUPROPION HCL ER (XL) 150 MG PO TB24: 150 MG | ORAL | Qty: 1

## 2016-07-18 NOTE — Progress Notes (Signed)
Discharge Note    Pt discharging on this date. Pt denies SI, HI, and AVH at this time. Pt reports improvement in behavior and is leaving unit in overall good condition. Pt reported she felt "much better" and that she was no longer having manic thoughts/feelings. Pt reported she felt her episode was due to medication change. Pt to follow up with Graciella FreerErin Coale [facility]16 for medication management at 12 PM. Pt also has an appointment PCP on 07/29/16 at 1:30 PM. Pt and CSW discussed pt [facility] [place], pt reported she had an upcoming appointment, CSW placed call to confirm or schedule, no answer, voicemail left requesting callback. CSW informed that if/when a callback is received from therapist, pt would be contacted and also encouraged pt to contact therapist for appointment. CSW and pt discussed pt's follow up appointments and importance of attending appointments as scheduled, pt voiced understanding and agreement. Pt able to verbally identify: warning signs, coping strategies, places and people that help make the pt feel better/distract negative thoughts, friends/family/agencies/professionals the pt can reach out to in a crisis, and something that is important to the pt/worth living for. Pt provided the national suicide prevention hotline number 704-472-4503(1-732-839-5315) as well as local community behavioral health [facility]) crisis number for emergencies [phone]).   Patient refused tobacco cessation counseling

## 2016-07-18 NOTE — Progress Notes (Signed)
SW met with treatment team to discuss pt's progress and setbacks.  SW 2 was present.  Pt reportedly slept well last night, appetite is good, attends scheduled group activities, social with peers/staff, performs ADL's, compliant with medications, affect is brighter, behavior has been calm and cooperative, reports depression/anxiety as "better", denies SI/HI/AVH, will be discharged today, follow-up appointments will be scheduled.

## 2016-07-18 NOTE — Progress Notes (Signed)
Patient has rested quietly this shift.  No distress noted or voiced.

## 2016-07-18 NOTE — Behavioral Health Treatment Team (Signed)
BHI Daily Shift Assessment  Nursing Progress Note    Room: 0619/619-01 Name: Joyce Cohen Age: 61 y.o.    Gender: female   FA:[id number]:Bipolar / Manic Precautions: suicide risk and fall risk  Target Symptoms:    Accu-Chek: Yes 122  Sleep: Yes,Sleep Quality Poor Restless SI no plan AVH denies HI Negative for homicidal ideation    Hours Slept: 8 PRN Sleep Meds: Yes Other PRN Meds: No Med Compliant: Yes Appetite: good Percent Meals: 100% Social: Yes ADLs: Yes Speech: normal Depression: 0 Anxiety: 0   Participation Teacher, adult educationLevelActive Listener and Interactive  Visitation: No  Participation QualityAppropriate, Attentive, Sharing and Supportive    Complaints: pt denies any complaints at this time will continue to monitor     Notes:     Signature:Peggy Smithlandasper, RCharity fundraiser

## 2016-07-18 NOTE — Behavioral Health Treatment Team (Signed)
[facility]  Discharge Note    Pt discharged with followings belongings:   Dentures: None  Vision - Corrective Lenses: None  Hearing Aid: None  Jewelry: None  Body Piercings Removed: N/A  Clothing: Footwear, Pants, Shirt, Socks, Undergarments (Comment), Pajamas, Other (Comment) (blue bag, brush, red robe , slippers, )  Were All Patient Medications Collected?: Not Applicable  Other Valuables: Other (Comment) (see belongings document)   Valuables sent home with patient. Valuables retrieved from safe, Security envelope number:  [phone] and returned to patient.  Patient left department with Departure Mode: With spouse via Mobility at Departure: Ambulatory, discharged to Discharged to: Private Residence. Patient education on aftercare instructions: patient given to patient Patient verbalize understanding of AVS:  Patient verbalize understanding.    Status EXAM upon discharge:  Status and Exam  Normal: No  Facial Expression: Brightened  Affect: Appropriate  Level of Consciousness: Alert  Mood:Normal: No  Mood: Depressed  Motor Activity:Normal: Yes  Interview Behavior: Cooperative  Preception: Orient to Person, Orient to Time, Orient to Place, Orient to Situation  Attention:Normal: Yes  Thought Processes: Circumstantial  Thought Content:Normal: Yes  Hallucinations: None  Delusions: No  Memory:Normal: Yes  Insight and Judgment: No  Insight and Judgment: Poor Insight  Present Suicidal Ideation: No  Present Homicidal Ideation: No    Linden DolinPeggy S Casper, RN

## 2016-07-18 NOTE — Plan of Care (Signed)
Problem: Altered Mood, Manic Behavior:  Goal: STG-Knowledge of positive coping patterns  Outcome: Ongoing                                                                      Group Therapy Note    Date: 07/18/2016  Start Time: 1000  End Time:  1100  Number of Participants: 4    Type of Group: Psychoeducation    Wellness Binder Information  Module Name:  Emotional Wellness  Session Number:  3    Patient's Goal:  To increase knowledge of strategies to enhance happiness    Notes:  Pt demonstrated increased knowledge of strategies to enhance happiness by actively participating in group activity.    Status After Intervention:  Improved    Participation Level: Active Listener    Participation Quality: Appropriate and Attentive      Speech:  normal      Thought Process/Content: Logical      Affective Functioning: Congruent      Mood: anxious and depressed      Level of consciousness:  Alert and Oriented x4      Response to Learning: Able to verbalize current knowledge/experience, Able to verbalize/acknowledge new learning and Progressing to goal      Endings: None Reported    Modes of Intervention: Education      Discipline Responsible: Psychoeducational Specialist      Signature:  Clydell HakimPatricia G Stark

## 2016-07-18 NOTE — Behavioral Health Treatment Team (Signed)
Group Therapy Note    Date: 07/18/16  Start Time:0830   End Time:0900    Number of Participants: 4    Type of Group: Unit rules / Patient goals     Patient's Goal:  Getting back home to my life     Notes:      Status After Intervention:  Improved    Participation Level: Active Listener    Participation Quality: Appropriate    Speech:  normal    Thought Process/Content: Logical    Affective Functioning: Congruent    Mood: elevated    Level of consciousness:  Oriented x4    Response to Learning: Able to verbalize current knowledge/experience, Able to verbalize/acknowledge new learning, Able to retain information and Capable of insight    Modes of Intervention: Education and Support    Discipline Responsible: Registered Nurse    Signature:  Linden DolinPeggy S Casper, RN

## 2016-07-18 NOTE — Discharge Instructions (Addendum)
BMP in 1 weeks---orders given to patient       Make f/u with her PCP in 1 week      Please do not stop taking any medication unless under direction of a doctor

## 2016-07-18 NOTE — Discharge Summary (Signed)
Discharge Summary     Patient ID:  Joyce Cohen  [id number]  61 y.o.  26-Aug-1955    Admit date: 07/16/2016  Discharge date: 07/18/2016    Admitting Physician: Laymond Purser, MD   Attending Physician: Laymond Purser, MD  Discharge Physician: Marisue Brooklyn     Admission Diagnoses: Bipolar I disorder with mania (HCC) [F31.10]  Bipolar disorder (HCC) [F31.9]  Bipolar disorder (HCC) [F31.9]    Discharge Diagnoses: Bipolar I disorder with mania (HCC) [F31.10]  Bipolar disorder (HCC) [F31.9]  Bipolar disorder (HCC) [F31.9]    Admission Condition: fair    Discharged Condition: good    Indication for Admission:   60 year old female comes to the emergency department due to manic episode. Patient has history of bipolar disorder. She was recently put on steroids for rash or arms. Rash is gone but over the past several days has not been sleeping. Patient states she is having suicidal ideation. She had a knife last night that her husband thankfully was able to get away from her. She has had similar issues before. Patient said she thinks that the steroids are probably the cause of her symptoms. She also has not been eating or drinking very much.  Pt seen today she denies any depression, anxiety no mood swings no paranoid or avh, no sihi. Pt admits that she believed that the steroid have a lot to do with her mental status, she is complaint with her meds and no behavior problems during the admission.     Hospital Course:   Patient was admitted to the adult behavioral health floor and was acclimated to the floor. Labs were reviewed and physical exam was completed by Dr. Daphine Deutscher and associates. Home medications were reconciled. KASPER was obtained and reviewed. Medication changes were made and patient tolerated well with no side effects. Patient attended and participated in groups. Patient was calm and cooperative with staff and peers. Patient was compliant with her medications. Patient was sleeping through the night. .This  patient is not suicidal, homicidal or psychotic at discharge. She does not present a danger to self or others.    Consults: Medical    Significant Diagnostic Studies: labs      MSE  Alert, Oriented X 4  Appearance:  Grooming and Hygiene attended to  Speech with Regular Rate and Rhythm  Eye Contact:  Good  No Psychomotor Agitation/Retardation Noted  Attitude:  Cooperative  Mood:  "Good"  Affective: Congruent, appropriate to the situation, with a normal range and intensity  Thought Processes:  Coherently communicated, logical and goal oriented  Thought Content:    Unremarkable  SI/HI: No Suicidal Ideation, No Homicidal Ideation,   Perceptions: No Auditory or Visual Hallucinations, No overt Delusions  Insight:  Present  Judgement:  Normal  Sleep Pattern: good  Appetite: good  Memory is intact for both remote and recent  Intellectual Functioning:  Within the Average [name]:  Adequate  Attention and Concentration:  Adequate        Discharge Exam:  No exam performed today,    Disposition: home    Patient Instructions:   Current Discharge Medication List      CONTINUE these medications which have NOT CHANGED    Details   hydrOXYzine (VISTARIL) 25 MG capsule Take 25 mg by mouth 3 times daily as needed for Itching      buPROPion (WELLBUTRIN XL) 150 MG extended release tablet Take 150 mg by mouth every morning  lubiprostone (AMITIZA) 24 MCG capsule Take 24 mcg by mouth daily (with breakfast)      fenofibrate micronized (LOFIBRA) 200 MG capsule Take 200 mg by mouth every morning (before breakfast)      mirtazapine (REMERON) 7.5 MG tablet Take 1 tablet by mouth nightly  Qty: 30 tablet, Refills: 3      lamoTRIgine (LAMICTAL) 100 MG tablet Take 1 tablet by mouth nightly  Qty: 30 tablet, Refills: 1      OLANZapine (ZYPREXA) 10 MG tablet Take 1 tablet by mouth nightly  Qty: 30 tablet, Refills: 3      traMADol (ULTRAM) 50 MG tablet Take 50 mg by mouth 2 times daily as needed for Pain..      insulin lispro  (HUMALOG) 100 UNIT/ML injection vial Inject 0-6 Units into the skin 3 times daily (with meals)  Qty: 1 vial, Refills: 3      insulin glargine (LANTUS) 100 UNIT/ML injection vial Inject 25 Units into the skin daily  Qty: 1 vial, Refills: 3      diclofenac sodium 1 % GEL Apply 2 g topically daily as needed for Pain      Cholecalciferol (VITAMIN D3) 50000 units CAPS Take 50,000 Units by mouth      acetaminophen (TYLENOL) 325 MG tablet Take 2 tablets by mouth every 4 hours as needed for Pain  Qty: 120 tablet, Refills: 3      albuterol sulfate HFA (VENTOLIN HFA) 108 (90 Base) MCG/ACT inhaler Inhale 2 puffs into the lungs every 4 hours as needed for Wheezing  Qty: 1 Inhaler, Refills: 3      famotidine (PEPCID) 20 MG tablet Take 1 tablet by mouth 2 times daily  Qty: 60 tablet, Refills: 3      Lancets MISC       NONFORMULARY Arthritis hand ointment      montelukast (SINGULAIR) 10 MG tablet Take 10 mg by mouth nightly         STOP taking these medications       traZODone (DESYREL) 100 MG tablet Comments:   Reason for Stopping:         buPROPion SR (WELLBUTRIN SR) 150 MG SR tablet Comments:   Reason for Stopping:             Activity: activity as tolerated  Diet: diabetic diet      Follow-up with Psych for med management    Time worked: More than 31 minutes    Participation:good    Electronically signed by Marisue BrooklynMarie-Addly Amarra Sawyer, MD on 07/18/2016 at 11:08 AM

## 2016-07-18 NOTE — Plan of Care (Signed)
Problem: Altered Mood, Manic Behavior:  Goal: STG-Knowledge of positive coping patterns  Outcome: Ongoing                                                                      Group Therapy Note    Date: 07/18/2016  Start Time: 1100  End Time:  1200  Number of Participants: 4    Type of Group: Recovery    Wellness Binder Information  Module Name:  Emotional Wellness  Session Number:  5    Patient's Goal:  Pt will be able to identify and learn about obstacles to emotional wellness    Notes:  Pt demonstrated improved knowledge of obstacles to emotional by actively participating in group activity. Pt able to identify obstacles that impact pt's overall emotional wellness such as; family, jobs, and health.    Status After Intervention:  Improved    Participation Level: Active Listener    Participation Quality: Appropriate and Attentive      Speech:  normal      Thought Process/Content: Logical      Affective Functioning: Congruent      Mood: depressed      Level of consciousness:  Alert and Attentive      Response to Learning: Able to verbalize current knowledge/experience and Able to verbalize/acknowledge new learning      Endings: None Reported    Modes of Intervention: Education, Socialization, Exploration, Problem-solving, Activity and Media      Discipline Responsible: Social Worker/Counselor      Signature:  Engineer, petroleumAmanda Marnie Fazzino, CSW

## 2016-07-18 NOTE — Progress Notes (Signed)
Progress Note  Joyce Cohen  07/18/2016 11:11 PM  Subjective:   Admit Date:   07/16/2016      CC/ADMIT DX:       Interval History:   Reviewed overnight events and nursing notes.  No new physical complaints.       I have reviewed all labs/diagnostics from the last 24hrs.       ROS:   I have done a 10 point ROS and all are negative, except what is mentioned in the HPI.         Medications:             Objective:   Vitals: BP 116/78   Pulse 78   Temp 96.8 F (36 C) (Temporal)   Resp 18   Ht 5\' 5"  (1.651 m)   Wt 212 lb 9.6 oz (96.4 kg)   LMP  (LMP Unknown)   SpO2 94%   BMI 35.38 kg/m  No intake or output data in the 24 hours ending 07/18/16 2311  General appearance: alert and cooperative with exam  Lungs: clear to auscultation bilaterally  Heart: regular rate and rhythm, S1, S2 normal, no murmur, click, rub or gallop  Abdomen: soft, non-tender; bowel sounds normal; no masses,  no organomegaly  Extremities: extremities normal, atraumatic, no cyanosis or edema  Neurologic:  No obvious focal neurologic deficits.    Assessment and Plan:   Active Problems:    Bipolar I disorder with mania (HCC)    Bipolar disorder (HCC)  Resolved Problems:    * No resolved hospital problems. *      Plan:  1.  Continue present medication(s)   2.  She is medically stable. I will monitor for any changes or concerns.   3.  Follow with Psych      Discharge planning:   her home     Reviewed treatment plans with the patient and/or family.             Electronically signed by Danny LawlessAribbe A Babetta Paterson, MD on 07/18/2016 at 11:11 PM

## 2016-07-18 NOTE — Behavioral Health Treatment Team (Signed)
Bridge Appointment completed: Reviewed Discharge Instructions with patient.    Patient verbalizes understanding and agreement with the discharge plan using the teachback method.     Referral for Outpatient Tobacco Cessation Counseling, upon discharge (mark X if applicable and completed):    ( )  Hospital staff assisted patient to call Quit Line or faxed referral                                   during hospitalization                  ( )  Recognizing danger situations (included triggers and roadblocks), if not completed on admission                    ( )  Coping skills (new ways to manage stress, exercise, relaxation techniques, changing routine, distraction), if not completed on admission                                                           ( )  Basic information about quitting (benefits of quitting, techniques in how to quit, available resources, if not completed on admission  ( ) [facility]   ( ) Patient refused referral  ( ) Patient refused counseling  (x ) Patient refused smoking cessation medication upon discharge    Vaccinations (mark X if applicable and completed):  ( ) Patient states already received influenza vaccine elsewhere  ( ) Patient received influenza vaccine during this hospitalization  ( x) Patient refused influenza vaccine at this time

## 2016-07-18 NOTE — Plan of Care (Signed)
Problem: Health Behavior:  Goal: Ability to verbalize adaptive coping strategies to use when suicidal feelings occur will improve  Ability to verbalize adaptive coping strategies to use when suicidal feelings occur will improve   Outcome: Ongoing    Goal: Ability to verbalize adaptive coping strategies to use when the urge to self-mutilate occurs will improve  Ability to verbalize adaptive coping strategies to use when the urge to self-mutilate occurs will improve   Outcome: Ongoing      Problem: Safety:  Goal: Ability to contract for his/her safety will improve  Ability to contract for his/her safety will improve   Outcome: Ongoing      Problem: Falls - Risk of:  Goal: Will remain free from falls  Will remain free from falls   Outcome: Ongoing    Goal: Absence of physical injury  Absence of physical injury   Outcome: Ongoing      Problem: Altered Mood, Manic Behavior:  Goal: Able to sleep  Able to sleep   Outcome: Ongoing    Goal: Able to verbalize decrease in frequency and intensity of racing thoughts  Able to verbalize decrease in frequency and intensity of racing thoughts   Outcome: Ongoing    Goal: Ability to disclose and discuss suicidal ideas will improve  Ability to disclose and discuss suicidal ideas will improve   Outcome: Ongoing    Goal: Absence of self-harm  Absence of self-harm   Outcome: Ongoing    Goal: Ability to achieve adequate nutritional intake will improve  Ability to achieve adequate nutritional intake will improve   Outcome: Ongoing    Goal: Ability to interact with others will improve  Ability to interact with others will improve   Outcome: Ongoing    Goal: Ability to demonstrate self-control will improve  Ability to demonstrate self-control will improve   Outcome: Ongoing    Goal: Mood stable  Mood stable   Outcome: Ongoing    Goal: STG-Knowledge of positive coping patterns  Outcome: Ongoing    Goal: LTG-Able to verbalize acceptance of life and situations over which he or she has no  control  Outcome: Ongoing

## 2016-07-19 NOTE — Telephone Encounter (Signed)
Pt states she is taking the Remeron with the Trazodone, last night pt was suppose to stop taking Trazodone but she had a hard time sleeping. Pt states she is not sure if it was the new medication or not, pt is wondering if she should increase the dose of her Remeron?

## 2016-07-23 NOTE — Telephone Encounter (Signed)
Pt was notified to increase remeron to 15 mg to see if that will help. Pt was notified to call back if anything changes.

## 2016-07-23 NOTE — Telephone Encounter (Signed)
For some reason my note closed, Sorry.

## 2016-07-24 ENCOUNTER — Inpatient Hospital Stay: Admit: 2016-07-24 | Discharge: 2016-07-24 | Payer: BLUE CROSS/BLUE SHIELD | Primary: Emergency Medicine

## 2016-07-24 ENCOUNTER — Inpatient Hospital Stay
Admit: 2016-07-24 | Discharge: 2016-07-24 | Payer: BLUE CROSS/BLUE SHIELD | Attending: Pain Medicine | Primary: Emergency Medicine

## 2016-07-24 ENCOUNTER — Encounter

## 2016-07-24 DIAGNOSIS — M5136 Other intervertebral disc degeneration, lumbar region: Secondary | ICD-10-CM

## 2016-07-24 DIAGNOSIS — M5116 Intervertebral disc disorders with radiculopathy, lumbar region: Secondary | ICD-10-CM

## 2016-07-24 MED ORDER — METHYLPREDNISOLONE ACETATE 80 MG/ML IJ SUSP
80 MG/ML | Freq: Once | INTRAMUSCULAR | Status: AC | PRN
Start: 2016-07-24 — End: 2016-07-24
  Administered 2016-07-24: 15:00:00 80 via EPIDURAL

## 2016-07-24 MED ORDER — METHYLPREDNISOLONE ACETATE 80 MG/ML IJ SUSP
80 | INTRAMUSCULAR | Status: AC
Start: 2016-07-24 — End: 2016-07-24

## 2016-07-24 MED ORDER — SODIUM CHLORIDE 0.9 % IJ SOLN
0.9 % | Freq: Once | INTRAMUSCULAR | Status: AC | PRN
Start: 2016-07-24 — End: 2016-07-24
  Administered 2016-07-24: 15:00:00 4

## 2016-07-24 MED ORDER — LIDOCAINE HCL (PF) 1 % IJ SOLN
1 % | Freq: Once | INTRAMUSCULAR | Status: AC | PRN
Start: 2016-07-24 — End: 2016-07-24
  Administered 2016-07-24: 14:00:00 5 via INTRADERMAL

## 2016-07-24 MED ORDER — BUPIVACAINE HCL (PF) 0.25 % IJ SOLN
0.25 | INTRAMUSCULAR | Status: AC
Start: 2016-07-24 — End: 2016-07-24

## 2016-07-24 MED ORDER — BUPIVACAINE HCL (PF) 0.25 % IJ SOLN
0.25 % | Freq: Once | INTRAMUSCULAR | Status: AC | PRN
Start: 2016-07-24 — End: 2016-07-24
  Administered 2016-07-24: 14:00:00 5 via EPIDURAL

## 2016-07-24 MED ORDER — IOHEXOL 240 MG/ML IJ SOLN
240 MG/ML | Freq: Once | INTRAMUSCULAR | Status: AC | PRN
Start: 2016-07-24 — End: 2016-07-24
  Administered 2016-07-24: 14:00:00 2 via EPIDURAL

## 2016-07-24 MED ORDER — SODIUM CHLORIDE 0.9 % IJ SOLN
0.9 | INTRAMUSCULAR | Status: AC
Start: 2016-07-24 — End: 2016-07-24

## 2016-07-24 MED FILL — SODIUM CHLORIDE 0.9 % IJ SOLN: 0.9 % | INTRAMUSCULAR | Qty: 20

## 2016-07-24 MED FILL — BUPIVACAINE HCL (PF) 0.25 % IJ SOLN: 0.25 % | INTRAMUSCULAR | Qty: 30

## 2016-07-24 MED FILL — DEPO-MEDROL 80 MG/ML IJ SUSP: 80 MG/ML | INTRAMUSCULAR | Qty: 1

## 2016-07-24 NOTE — Progress Notes (Signed)
Procedure:  Level of Consciousness: [x] Alert [x] Oriented [] Disoriented [] Lethargic  Anxiety Level: [x] Calm [] Anxious [] Depressed [] Other  Skin: [x] Warm [x] Dry [] Cool [] Moist [] Intact [] Other  Cardiovascular: [] Palpitations: [x] Never [] Occasionally [] Frequently  Chest Pain: [x] No [] Yes  Respiratory:  [x] Unlabored [] Labored [] Cough ([]  Productive [] Unproductive)  HCG Required: [x] No [] Yes   Results: [] Negative [] Positive  Knowledge Level:        [x] Patient/Other verbalized understanding of pre-procedure instructions.        [x] Assessment of post-op care needs (transportation, responsible caregiver)        [x] Able to discuss health care problems and how to deal with it.  Factors that Affect Teaching:        Language Barrier: [x] No [] Yes - why:        Hearing Loss:        [x] No [] Yes            Corrective Device:  [] Yes [] No        Vision Loss:           [] No [x] Yes            Corrective Device:  [x] Yes [] No        Memory Loss:       [x] No [] Yes            [] Short Term [] Long Term  Motivational Level:  [x] Asks Questions                  [] Extremely Anxious       [x] Seems Interested               [] Seems Uninterested                  [x] Denies need for Education  Risk for Injury:  [x] Patient oriented to person, place and time  [] History of frequent falls/loss of balance  Nutritional:  [] Change in appetite   [] Weight Gain   [] Weight Loss  Functional:  [] Requires assistance with ADL's      Nursing Admission Record    Current Issues / Falls / ER Visits:  No    Percentage of Pain Relief after Last Procedure:  100 %    How long lasted:  10 weeks    Radiology exams received during the last 12 months: No       When na                                              Where na       Imaging on chart: No         Imaging records requested: No  MRI exams received in the past 2 years:  No  Physical therapy during the last 6 months: No       When: na                                             Where na  Labs during the last 12 months:  Yes    Education Provided:  [x]  Review of [place]  []  Agreement Review  []  Compliance Issues Discussed    []  Cognitive Behavior Needs [x]  Exercise []  Review of Test []  Financial Issues  []  Tobacco/Alcohol Use [x]  Teaching []  New Patient []  Picture Obtained    Physician  Plan:  []  Outgoing Referral  []  Pharmacy Consult  []  Test Ordered   []  Obtained Test Results / Consult Notes  []  UDS due at next visit, verified per EPIC      []  Suspected Physical Abuse or Suicide Risk assessed - IF YES COMPLETE QUESTIONS BELOW    If any of the following questions are answered yes - contact attending physician for referral:    Has been considering harming self to escape stress, pain problems?  []  YES  [x]  NO  Has a suicide plan? []  YES  [x]  NO  Has attempted suicide in the past?   []  YES  [x]  NO  Has a close friend or family member who committed suicide?  []  YES  [x]  NO    Patient Referred To :     Additional Notes:    Assessment Completed by:  Electronically signed by Laneta Simmers, RN on 07/24/2016 at 10:12 AM

## 2016-07-24 NOTE — Procedures (Signed)
Joyce Cohen is a 61 y.o. female patient.  1. Lumbar radiculopathy    2. Lumbar disc disease with radiculopathy    3. Right inguinal pain    4. Ilioinguinal neuralgia of right side    5. Right groin pain      Past Medical History:   Diagnosis Date   . Abdominal pain, right lower quadrant    . 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 Arthritis    . Bipolar disorder (HCC)    . Blood circulation, collateral    . Cancer (HCC)     skin cancer   . Diabetes mellitus (HCC)    . Hearing aid worn     bilateral   . Hernia    . Hyperlipidemia    . MVP (mitral valve prolapse)    . Neuromuscular disorder (HCC) Inguinal Nerve pain   . Pleurisy without mention of effusion or current tuberculosis    . Right groin pain 02/28/2014   . Stomach ulcer     as a child   . Torn rotator cuff     right shoulder   . Unspecified asthma(493.90)    . Unspecified constipation    . Unspecified hemorrhoids without mention of complication      Blood pressure (!) 119/52, pulse 80, temperature 97.7 F (36.5 C), temperature source Temporal, resp. rate 18, height 5\' 5"  (1.651 m), weight 207 lb (93.9 kg), SpO2 97 %, not currently breastfeeding.    Procedures  DATE: 07/24/2016      REASON FOR VISIT: Principal Problem:    Lumbar disc disease with radiculopathy  Resolved Problems:    * No resolved hospital problems. *        PROCEDURE: Lumbar Epidural Steroid Injection.   []  Moderate Sedation    DESCRIPTION OF PROCEDURE:              After obtaining informed consent, the patient was taken to the procedure room, positioned prone, and sterilely prepped.  The procedure was performed under fluoroscopic guidance.  First 5 ml of 1% Xylocaine was used at L5 - S1 for local anesthesia.  A 19-gauge Hustead needle was advanced to the epidural space.  The was confirmed on the fluoroscope with an injection of [x]  2ml  []  ml Contrast.  Then, [x]  5ml of 0.25% Marcaine, [x]  4ml of Normal Saline, and [x]  80 mg of Depo Medrol was gently injected.         There were no complications.    DIAGNOSES:  []  Low Back Pain  [x]  *Lumbar Radiculopathy  [x]  *Lumbar Degenerative Disc Disease  []  *Lumbar Spinal Stenosis  []  *Lumbar Postlaminectomy Syndrome  []  Other    PLAN:  []  Will return to office in  3 month(s)for  [x]  Planned Procedure  []  Office Visit  []  Prescriptions were given today   []  No prescriptions needed today  []  Patient is to call with any questions or concerns which may arise prior to the next office visit.    COMMENTS:              Beatrix Fettersiley D Analee Montee, MD                []  Over 50% of today's appointment was given to discussion, evaluation and counseling.  Beatrix Fettersiley D Arik Husmann, MD  07/24/2016

## 2016-07-25 MED ORDER — MIRTAZAPINE 15 MG PO TABS
15 | ORAL_TABLET | ORAL | 2 refills | Status: DC
Start: 2016-07-25 — End: 2016-11-13

## 2016-07-25 NOTE — Telephone Encounter (Signed)
Pt called in, since you increased remeron to 15 mg from 7.5 because you had her double the dose. Pt will run out before refill is due.

## 2016-07-25 NOTE — Telephone Encounter (Signed)
Reflected dosage change in chart and at [facility].

## 2016-07-26 MED ORDER — TRAZODONE HCL 100 MG PO TABS
100 | ORAL_TABLET | Freq: Every evening | ORAL | 1 refills | Status: DC
Start: 2016-07-26 — End: 2016-09-18

## 2016-07-26 NOTE — Telephone Encounter (Signed)
Pt notified of this. Thanks.

## 2016-07-26 NOTE — Telephone Encounter (Signed)
Pt has called in stated she took 2 Remeron to try to help her sleep but it did not work. Pt is wanting to know if she can take 1 Remeron and 1 trazodone because when she did that it helped her sleep but pt does not have a prescription anymore for trazodone and is wanting to know if you can call that in.

## 2016-08-28 ENCOUNTER — Ambulatory Visit
Admit: 2016-08-28 | Discharge: 2016-08-28 | Payer: BLUE CROSS/BLUE SHIELD | Attending: Psychiatric/Mental Health | Primary: Emergency Medicine

## 2016-08-28 DIAGNOSIS — F319 Bipolar disorder, unspecified: Secondary | ICD-10-CM

## 2016-08-28 MED ORDER — LAMOTRIGINE 200 MG PO TABS
200 | ORAL_TABLET | Freq: Every evening | ORAL | 1 refills | Status: DC
Start: 2016-08-28 — End: 2016-11-13

## 2016-08-28 MED ORDER — BUPROPION HCL ER (XL) 150 MG PO TB24
150 | ORAL_TABLET | Freq: Every morning | ORAL | 3 refills | Status: DC
Start: 2016-08-28 — End: 2017-01-10
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
## 2016-09-18 MED ORDER — TRAZODONE HCL 100 MG PO TABS
100 MG | ORAL_TABLET | Freq: Every evening | ORAL | 3 refills | Status: DC
Start: 2016-09-18 — End: 2017-01-10

## 2016-09-18 NOTE — Telephone Encounter (Signed)
E-Prescribing Status: Receipt confirmed by pharmacy (09/18/2016 11:05 AM CDT)
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
## 2016-10-10 ENCOUNTER — Encounter: Attending: Psychiatric/Mental Health | Primary: Emergency Medicine

## 2016-11-13 MED ORDER — LAMOTRIGINE 200 MG PO TABS
200 MG | ORAL_TABLET | Freq: Every evening | ORAL | 3 refills | Status: DC
Start: 2016-11-13 — End: 2017-01-31

## 2016-11-13 MED ORDER — MIRTAZAPINE 15 MG PO TABS
15 MG | ORAL_TABLET | ORAL | 3 refills | Status: DC
Start: 2016-11-13 — End: 2017-01-31
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
## 2016-11-14 NOTE — Telephone Encounter (Signed)
E-scribed to pharmacy.

## 2016-11-21 ENCOUNTER — Ambulatory Visit
Admit: 2016-11-21 | Discharge: 2016-11-21 | Payer: BLUE CROSS/BLUE SHIELD | Attending: Psychiatric/Mental Health | Primary: Emergency Medicine

## 2016-11-21 DIAGNOSIS — F319 Bipolar disorder, unspecified: Secondary | ICD-10-CM

## 2016-11-21 NOTE — Progress Notes (Signed)
11/21/2016 10:31 AM   Progress Note        Joyce Cohen February 24, 1955  Psychotherapy Time Spent: 24 min      Psychotherapy Topics: family and health    Chief Complaint   Patient presents with   . Anxiety         Subjective:  Patient is a 61 yo CF diagnosed with Bipolar 1 D/O and history of depression, anxiety, and SI and presents today for follow-up.  Last seen in clinic on 08/28/16 and prior records were reviewed.    Today patient states, "I'm good." States "I'm feeling fine." Reports PTSD has been triggered more with the events surrounding [place]. States improvement since taking a break from [employer]. Patient says she is "not falling asleep as easy as I'd like." Denies mania and hypomania. Reports a depressive episode where she was grumpy, but relates it to epidural injection. 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 Appetite fluctuates. Blood sugars have been running "pretty good."  Patient's goal is to lose weight and would like to weigh 135 lbs again which she thinks "is very doable." No medication changes needed at this time.     Patient reports side effects as follows: none. No evidence of EPS, no cogwheeling or abnormal motor movements.  Absent  suicidal ideation.  Reports compliance with medications as good .     Review of Systems - 12 point review negative   History obtained via chart review and patient  PCP is Dr. Raynelle Highland      Current Meds:    Prior to Admission medications    Medication Sig Start Date End Date Taking? Authorizing Provider   lamoTRIgine (LAMICTAL) 200 MG tablet TAKE 1 TABLET BY MOUTH NIGHTLY 11/13/16   Talbert Cage, APRN   mirtazapine (REMERON) 15 MG tablet TAKE 1 TABLET BY MOUTH EVERY NIGHT FOR SLEEP 11/13/16   Talbert Cage, APRN   traZODone (DESYREL) 100 MG tablet TAKE 1 TABLET BY MOUTH NIGHTLY 09/18/16   Talbert Cage, APRN   buPROPion (WELLBUTRIN XL) 150 MG extended release tablet Take 1 tablet by  mouth every morning 08/28/16   Talbert Cage, APRN   lubiprostone (AMITIZA) 24 MCG capsule Take 24 mcg by mouth daily (with breakfast)    Historical Provider, MD   fenofibrate micronized (LOFIBRA) 200 MG capsule Take 200 mg by mouth every morning (before breakfast)    Historical Provider, MD   OLANZapine (ZYPREXA) 10 MG tablet Take 1 tablet by mouth nightly  Patient taking differently: Take 10 mg by mouth nightly  07/10/16   Talbert Cage, APRN   traMADol (ULTRAM) 50 MG tablet Take 50 mg by mouth 2 times daily as needed for Pain.Marland Kitchen    Historical Provider, MD   insulin lispro (HUMALOG) 100 UNIT/ML injection vial Inject 0-6 Units into the skin 3 times daily (with meals) 02/21/16   Georgia Lopes, MD   insulin glargine (LANTUS) 100 UNIT/ML injection vial Inject 25 Units into the skin daily  Patient taking differently: Inject 40 Units into the skin daily  02/22/16   Georgia Lopes, MD   diclofenac sodium 1 % GEL Apply 2 g topically daily as needed for Pain    Historical Provider, MD   Cholecalciferol (VITAMIN D3) 50000 units CAPS Take 50,000 Units by mouth    Historical Provider, MD   acetaminophen (TYLENOL) 325 MG tablet Take 2 tablets by mouth every 4 hours as  needed for Pain 10/11/15   Marcelina MorelLaurie Kay Ballew, DO   albuterol sulfate HFA (VENTOLIN HFA) 108 (90 Base) MCG/ACT inhaler Inhale 2 puffs into the lungs every 4 hours as needed for Wheezing 10/11/15   Jeanene ErbLaurie Kay Ballew, DO   famotidine (PEPCID) 20 MG tablet Take 1 tablet by mouth 2 times daily 10/11/15   Jeanene ErbLaurie Kay Ballew, DO   Lancets MISC  08/31/15   Historical Provider, MD   NONFORMULARY Arthritis hand ointment    Historical Provider, MD   montelukast (SINGULAIR) 10 MG tablet Take 10 mg by mouth nightly    Historical Provider, MD           MSE:  Patient is  A & O x3.  Appearance:  well-appearing and in chair appropriately dressed for season and age.  Cognition:  Recent memory intact , remote memory intact , good fund of knowledge, average  intelligence level.    Speech:  normal  Language: Naming: intact; Word Finding: intact  Conversation no evidence of delusions  Behavior:  Cooperative and Good eye contact  Mood: euthymic  Affect: congruent with mood and flat  Thought Content: no evidence of overt psychosis, delusional thought or suicidal /homicidal ideation or plan  Thought Process: linear, goal directed and coherent  Judgement Insight:  normal and appropriate  Gait and Station:normal gait and station and normal balance   Musculoskeletal: WNL      Assesment:   1. Bipolar 1 disorder (HCC)    2. Severe episode of recurrent major depressive disorder, without psychotic features (HCC)        Plan:  Continue medications as prescribed.   1. The risks, benefits, side effects, indications, contraindications, and adverse effects of the medications have been discussed. Yes.  2. The pt has verbalized understanding and has capacity to give informed consent.  3. The Zadie RhineKasper report has been reviewed according to [facility] regulations.  4. Supportive therapy offered.  5. Follow up: Return in about 10 weeks (around 01/30/2017).  6. The patient has been advised to call with any problems.  7. Controlled substance Treatment Plan.  8. The above listed medications have been continued, modifications in meds and other orders/labs as follows:    No orders of the defined types were placed in this encounter.     No orders of the defined types were placed in this encounter.      9. Additional comments:      Graciella FreerErin Marita Burnsed, PMHNP-BC

## 2016-11-28 ENCOUNTER — Encounter: Attending: Psychiatric/Mental Health | Primary: Emergency Medicine

## 2017-01-13 NOTE — Telephone Encounter (Signed)
Last ov 11/21/16  Next ov 01/31/17   Requested Prescriptions     Pending Prescriptions Disp Refills   . traZODone (DESYREL) 100 MG tablet [Pharmacy Med Name: TRAZODONE 100MG  TABLET] 30 tablet      Sig: TAKE 1 TABLET BY MOUTH NIGHTLY   . buPROPion (WELLBUTRIN XL) 150 MG extended release tablet [Pharmacy Med Name: BUPROPN HCL  TAB 150MG  XL TABLET] 30 tablet      Sig: TAKE 1 TABLET BY MOUTH EVERY MORNING     01/13/2017 8:47 AM   Progress Note        Joyce Cohen 01/31/1956  Psychotherapy Time Spent: 24 min      Psychotherapy Topics: family and health    Chief Complaint   Patient presents with   . Medication Refill         Subjective:  Patient is a 61 yo CF diagnosed with Bipolar 1 D/O and history of depression, anxiety, and SI and presents today for follow-up.  Last seen in clinic on 08/28/16 and prior records were reviewed.    Today patient states, "I'm good." States "I'm feeling fine." Reports PTSD has been triggered more with the events surrounding [place]. States improvement since taking a break from [employer]. Patient says she is "not falling asleep as easy as I'd like." Denies mania and hypomania. Reports a depressive episode where she was grumpy, but relates it to epidural injection. 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 Appetite fluctuates. Blood sugars have been running "pretty good."  Patient's goal is to lose weight and would like to weigh 135 lbs again which she thinks "is very doable." No medication changes needed at this time.     Patient reports side effects as follows: none. No evidence of EPS, no cogwheeling or abnormal motor movements.  Absent  suicidal ideation.  Reports compliance with medications as good .     Review of Systems - 12 point review negative   History obtained via chart review and patient  PCP is Dr. Raynelle HighlandJohn Brazzell      Current Meds:    Prior to Admission medications    Medication Sig Start Date End Date Taking?  Authorizing Provider   lamoTRIgine (LAMICTAL) 200 MG tablet TAKE 1 TABLET BY MOUTH NIGHTLY 11/13/16   Talbert CageKatharine Erin Coale, APRN   mirtazapine (REMERON) 15 MG tablet TAKE 1 TABLET BY MOUTH EVERY NIGHT FOR SLEEP 11/13/16   Talbert CageKatharine Erin Coale, APRN   traZODone (DESYREL) 100 MG tablet TAKE 1 TABLET BY MOUTH NIGHTLY 09/18/16   Talbert CageKatharine Erin Coale, APRN   buPROPion (WELLBUTRIN XL) 150 MG extended release tablet Take 1 tablet by mouth every morning 08/28/16   Talbert CageKatharine Erin Coale, APRN   lubiprostone (AMITIZA) 24 MCG capsule Take 24 mcg by mouth daily (with breakfast)    Historical Provider, MD   fenofibrate micronized (LOFIBRA) 200 MG capsule Take 200 mg by mouth every morning (before breakfast)    Historical Provider, MD   OLANZapine (ZYPREXA) 10 MG tablet Take 1 tablet by mouth nightly  Patient taking differently: Take 10 mg by mouth nightly  07/10/16   Talbert CageKatharine Erin Coale, APRN   traMADol (ULTRAM) 50 MG tablet Take 50 mg by mouth 2 times daily as needed for Pain.Marland Kitchen.    Historical Provider, MD   insulin lispro (HUMALOG) 100 UNIT/ML injection vial Inject 0-6 Units into the skin 3 times daily (with meals) 02/21/16   Georgia Lopesana McGaffee, MD   insulin  glargine (LANTUS) 100 UNIT/ML injection vial Inject 25 Units into the skin daily  Patient taking differently: Inject 40 Units into the skin daily  02/22/16   Georgia Lopesana McGaffee, MD   diclofenac sodium 1 % GEL Apply 2 g topically daily as needed for Pain    Historical Provider, MD   Cholecalciferol (VITAMIN D3) 50000 units CAPS Take 50,000 Units by mouth    Historical Provider, MD   acetaminophen (TYLENOL) 325 MG tablet Take 2 tablets by mouth every 4 hours as needed for Pain 10/11/15   Marcelina MorelLaurie Kay Ballew, DO   albuterol sulfate HFA (VENTOLIN HFA) 108 (90 Base) MCG/ACT inhaler Inhale 2 puffs into the lungs every 4 hours as needed for Wheezing 10/11/15   Jeanene ErbLaurie Kay Ballew, DO   famotidine (PEPCID) 20 MG tablet Take 1 tablet by mouth 2 times daily 10/11/15   Jeanene ErbLaurie Kay Ballew, DO   Lancets MISC   08/31/15   Historical Provider, MD   NONFORMULARY Arthritis hand ointment    Historical Provider, MD   montelukast (SINGULAIR) 10 MG tablet Take 10 mg by mouth nightly    Historical Provider, MD           MSE:  Patient is  A & O x3.  Appearance:  well-appearing and in chair appropriately dressed for season and age.  Cognition:  Recent memory intact , remote memory intact , good fund of knowledge, average  intelligence level.   Speech:  normal  Language: Naming: intact; Word Finding: intact  Conversation no evidence of delusions  Behavior:  Cooperative and Good eye contact  Mood: euthymic  Affect: congruent with mood and flat  Thought Content: no evidence of overt psychosis, delusional thought or suicidal /homicidal ideation or plan  Thought Process: linear, goal directed and coherent  Judgement Insight:  normal and appropriate  Gait and Station:normal gait and station and normal balance   Musculoskeletal: WNL      Assesment:   No diagnosis found.    Plan:  Continue medications as prescribed.   1. The risks, benefits, side effects, indications, contraindications, and adverse effects of the medications have been discussed. Yes.  2. The pt has verbalized understanding and has capacity to give informed consent.  3. The Zadie RhineKasper report has been reviewed according to [facility] regulations.  4. Supportive therapy offered.  5. Follow up: No Follow-up on file.  6. The patient has been advised to call with any problems.  7. Controlled substance Treatment Plan.  8. The above listed medications have been continued, modifications in meds and other orders/labs as follows:    No orders of the defined types were placed in this encounter.     No orders of the defined types were placed in this encounter.      9. Additional comments:      Graciella FreerErin Coale, PMHNP-BC

## 2017-01-14 MED ORDER — TRAZODONE HCL 100 MG PO TABS
100 MG | ORAL_TABLET | Freq: Every evening | ORAL | 5 refills | Status: DC
Start: 2017-01-14 — End: 2017-01-31

## 2017-01-14 MED ORDER — BUPROPION HCL ER (XL) 150 MG PO TB24
150 MG | ORAL_TABLET | Freq: Every morning | ORAL | 3 refills | Status: DC
Start: 2017-01-14 — End: 2017-05-13

## 2017-01-14 NOTE — Telephone Encounter (Signed)
E-scribed to pharmacy.

## 2017-01-31 ENCOUNTER — Ambulatory Visit
Admit: 2017-01-31 | Discharge: 2017-01-31 | Payer: BLUE CROSS/BLUE SHIELD | Attending: Psychiatric/Mental Health | Primary: Emergency Medicine

## 2017-01-31 DIAGNOSIS — F319 Bipolar disorder, unspecified: Secondary | ICD-10-CM

## 2017-01-31 MED ORDER — LAMOTRIGINE 200 MG PO TABS
200 | ORAL_TABLET | Freq: Every evening | ORAL | 3 refills | Status: DC
Start: 2017-01-31 — End: 2017-07-11

## 2017-01-31 MED ORDER — TRAZODONE HCL 100 MG PO TABS
100 MG | ORAL_TABLET | ORAL | 3 refills | Status: DC
Start: 2017-01-31 — End: 2017-06-16

## 2017-01-31 MED ORDER — MIRTAZAPINE 15 MG PO TABS
15 | ORAL_TABLET | ORAL | 3 refills | Status: DC
Start: 2017-01-31 — End: 2017-08-13

## 2017-01-31 NOTE — Progress Notes (Signed)
01/31/2017 12:10 PM   Progress Note        Joyce Cohen [birth year]  Psychotherapy Time Spent: 18 min      Psychotherapy Topics: family, financial, health and marital    Chief Complaint   Patient presents with   . Insomnia   . Mood Swings         Subjective:  Patient is 61 yo CF diagnosed with Bipolar 1 D/O, Insomnia, PTSD, and history of depression, anxiety, and SI and presents today for follow-up.  Last seen in clinic on 11/21/16 and prior records were reviewed.      Today patient states, "I can't sleep. I've been doubling up on Trazodone." Reports it has helped. Says she is able to fall asleep with the double dose in about 20-40 minutes. States sleep has been restful in quality. She was averaging about 4 hours of sleep with 100 mg of Trazodone. Reports 8-9 hours with 200 mg. She says she has mild grogginess with the increased dose. Mood is fair. States "I don't feel depressed, but I notice I'm isolating and not taking care of myself. I mean, I take a bath with baby wipes." Reports anergia, lack of motivation, and poor hygiene. Hair is greasy but brushed. States "actually I feel pretty hopeful this year." Correlates hope with decreased financial stress. Patient's husband has started a job driving a bus for [employer]. States she and her husband are actually getting presents for each other. They are also getting new windows in the bedroom, got their dryer fixed, and got tires on their car. Patient says she has struggled with SAD in the past. Reports her oldest daughter was diagnosed with ovarian cancer. Denies symptoms of mania. No reports of episodes of mania or hypomania since last ov. She has been baking and enjoys it. She continues to do Step Study every Monday night. Appetite is stable.     Patient reports side effects as follows: insomnia. No evidence of EPS, no cogwheeling or abnormal motor movements.  Patient has history of SI but is absent suicidal ideation today.  Reports  compliance with medications as good .     Review of Systems - 14 point review negative today except insomnia, chronic pain due to DDD  History obtained via chart review and patient  PCP is Dr. Raynelle HighlandJohn Brazzell  BP is 126/89  Weight is 214  Pulse is 93      Current Meds:    Prior to Admission medications    Medication Sig Start Date End Date Taking? Authorizing Provider   traZODone (DESYREL) 100 MG tablet Take 2 tablets nightly 01/31/17  Yes Talbert CageKatharine Erin Amer Alcindor, APRN   mirtazapine (REMERON) 15 MG tablet TAKE 1 TABLET BY MOUTH EVERY NIGHT FOR SLEEP 01/31/17  Yes Talbert CageKatharine Erin Akeia Perot, APRN   lamoTRIgine (LAMICTAL) 200 MG tablet Take 1 tablet by mouth nightly 01/31/17  Yes Talbert CageKatharine Erin Cynthya Yam, APRN   buPROPion (WELLBUTRIN XL) 150 MG extended release tablet TAKE 1 TABLET BY MOUTH EVERY MORNING 01/14/17  Yes Talbert CageKatharine Erin Sailor Hevia, APRN   lubiprostone (AMITIZA) 24 MCG capsule Take 24 mcg by mouth daily (with breakfast)   Yes Historical Provider, MD   fenofibrate micronized (LOFIBRA) 200 MG capsule Take 200 mg by mouth every morning (before breakfast)   Yes Historical Provider, MD   OLANZapine (ZYPREXA) 10 MG tablet Take 1 tablet by mouth nightly  Patient taking differently: Take 10 mg by mouth nightly  07/10/16  Yes Talbert CageKatharine Erin Nasim Garofano, APRN  traMADol (ULTRAM) 50 MG tablet Take 50 mg by mouth 2 times daily as needed for Pain..   Yes Historical Provider, MD   insulin lispro (HUMALOG) 100 UNIT/ML injection vial Inject 0-6 Units into the skin 3 times daily (with meals) 02/21/16  Yes Georgia Lopesana McGaffee, MD   insulin glargine (LANTUS) 100 UNIT/ML injection vial Inject 25 Units into the skin daily  Patient taking differently: Inject 40 Units into the skin daily  02/22/16  Yes Georgia Lopesana McGaffee, MD   diclofenac sodium 1 % GEL Apply 2 g topically daily as needed for Pain   Yes Historical Provider, MD   Cholecalciferol (VITAMIN D3) 50000 units CAPS Take 50,000 Units by mouth   Yes Historical Provider, MD   acetaminophen (TYLENOL) 325 MG tablet  Take 2 tablets by mouth every 4 hours as needed for Pain 10/11/15  Yes Marcelina MorelLaurie Kay Ballew, DO   albuterol sulfate HFA (VENTOLIN HFA) 108 (90 Base) MCG/ACT inhaler Inhale 2 puffs into the lungs every 4 hours as needed for Wheezing 10/11/15  Yes Marcelina MorelLaurie Kay Ballew, DO   famotidine (PEPCID) 20 MG tablet Take 1 tablet by mouth 2 times daily 10/11/15  Yes Jeanene ErbLaurie Kay Ballew, DO   Lancets MISC  08/31/15  Yes Historical Provider, MD   NONFORMULARY Arthritis hand ointment   Yes Historical Provider, MD   montelukast (SINGULAIR) 10 MG tablet Take 10 mg by mouth nightly   Yes Historical Provider, MD        MSE:  Patient is  A & O x3.  Appearance:  street clothes, in chair, fair grooming and fair hygiene appropriately dressed for season and age.  Cognition:  Recent memory intact , remote memory intact , good fund of knowledge, average  intelligence level.   Speech:  normal  Language: Naming: intact; Word Finding: intact  Conversation no evidence of delusions  Behavior:  Cooperative and Good eye contact  Mood: euthymic  Affect: congruent with mood and blunted  Thought Content: no evidence of overt psychosis, delusional thought or suicidal /homicidal ideation or plan  Thought Process: linear, goal directed and coherent and associations appropriate  Judgement Insight:  normal and appropriate  Gait and Station:normal gait and station and normal balance   Musculoskeletal: WNL      Assesment:   1. Bipolar 1 disorder (HCC)    2. Insomnia due to other mental disorder    3. History of suicidal ideation    4. Posttraumatic stress disorder        Plan:  Increase Trazodone 100 mg PO take 2 qhs  Continue Remeron 15 mg PO qhs - patient reports better sleep on both Trazodone and Remeron  Continue Lamictal 200 mg PO qhs  Continue Wellbutrin XL 150 mg PO qam  Continue Zyprexa 10 mg PO qhs  1. The risks, benefits, side effects, indications, contraindications, and adverse effects of the medications have been discussed. Yes.  2. The pt has verbalized  understanding and has capacity to give informed consent.  3. The Zadie RhineKasper report has [facility].  4. Supportive therapy offered.  5. Follow up: Return in about 2 months (around 04/03/2017).  6. The patient has been advised to call with any problems.  7. Controlled substance Treatment Plan.  8. The above listed medications have been continued, modifications in meds and other orders/labs as follows:      Orders Placed This Encounter   Medications   . traZODone (DESYREL) 100 MG tablet     Sig:  Take 2 tablets nightly     Dispense:  60 tablet     Refill:  3   . mirtazapine (REMERON) 15 MG tablet     Sig: TAKE 1 TABLET BY MOUTH EVERY NIGHT FOR SLEEP     Dispense:  30 tablet     Refill:  3   . lamoTRIgine (LAMICTAL) 200 MG tablet     Sig: Take 1 tablet by mouth nightly     Dispense:  30 tablet     Refill:  3      No orders of the defined types were placed in this encounter.      9. Additional comments:      Graciella Freer, PMHNP-BC

## 2017-03-28 NOTE — Telephone Encounter (Signed)
Scheduled diagnostic imaging

## 2017-04-03 ENCOUNTER — Encounter: Primary: Emergency Medicine

## 2017-04-03 ENCOUNTER — Encounter: Attending: Psychiatric/Mental Health | Primary: Emergency Medicine

## 2017-04-18 ENCOUNTER — Encounter: Payer: BLUE CROSS/BLUE SHIELD | Attending: Psychiatric/Mental Health | Primary: Emergency Medicine

## 2017-05-13 MED ORDER — OLANZAPINE 10 MG PO TABS
10 MG | ORAL_TABLET | Freq: Every evening | ORAL | 1 refills | Status: DC
Start: 2017-05-13 — End: 2017-08-13

## 2017-05-13 MED ORDER — BUPROPION HCL ER (XL) 150 MG PO TB24
150 MG | ORAL_TABLET | Freq: Every morning | ORAL | 3 refills | Status: DC
Start: 2017-05-13 — End: 2017-09-18

## 2017-05-13 NOTE — Telephone Encounter (Signed)
 Last ov 01/31/17  Next ov 06/06/17   Requested Prescriptions     Pending Prescriptions Disp Refills   . buPROPion (WELLBUTRIN XL) 150 MG extended release tablet [Pharmacy Med Name: BUPROPN HCL  TAB 150MG  XL TABLET] 30 tablet 3     Sig: TAKE 1 TABLET BY MOUTH EVERY MORNING   . OLANZapine (ZYPREXA) 10 MG tablet [Pharmacy Med Name: OLANZAPINE 10MG  TABLET] 90 tablet      Sig: TAKE 1/2 TABLET BY MOUTH EVERY NIGHT AT BEDTIME     05/13/2017 11:09 AM   Progress Note        Joyce Cohen Jul 31, 1955  Psychotherapy Time Spent: 18 min      Psychotherapy Topics: family, financial, health and marital    Chief Complaint   Patient presents with   . Medication Refill         Subjective:  Patient is 62 yo CF diagnosed with Bipolar 1 D/O, Insomnia, PTSD, and history of depression, anxiety, and SI and presents today for follow-up.  Last seen in clinic on 11/21/16 and prior records were reviewed.      Today patient states, "I can't sleep. I've been doubling up on Trazodone." Reports it has helped. Says she is able to fall asleep with the double dose in about 20-40 minutes. States sleep has been restful in quality. She was averaging about 4 hours of sleep with 100 mg of Trazodone. Reports 8-9 hours with 200 mg. She says she has mild grogginess with the increased dose. Mood is fair. States "I don't feel depressed, but I notice I'm isolating and not taking care of myself. I mean, I take a bath with baby wipes." Reports anergia, lack of motivation, and poor hygiene. Hair is greasy but brushed. States "actually I feel pretty hopeful this year." Correlates hope with decreased financial stress. Patient's husband has started a job driving a bus for [employer]. States she and her husband are actually getting presents for each other. They are also getting new windows in the bedroom, got their dryer fixed, and got tires on their car. Patient says she has struggled with SAD in the past. Reports her oldest daughter was  diagnosed with ovarian cancer. Denies symptoms of mania. No reports of episodes of mania or hypomania since last ov. She has been baking and enjoys it. She continues to do Step Study every Monday night. Appetite is stable.     Patient reports side effects as follows: insomnia. No evidence of EPS, no cogwheeling or abnormal motor movements.  Patient has history of SI but is absent suicidal ideation today.  Reports compliance with medications as good .     Review of Systems - 14 point review negative today except insomnia, chronic pain due to DDD  History obtained via chart review and patient  PCP is Dr. Raynelle Highland  BP is 126/89  Weight is 214  Pulse is 93      Current Meds:    Prior to Admission medications    Medication Sig Start Date End Date Taking? Authorizing Provider   traZODone (DESYREL) 100 MG tablet Take 2 tablets nightly 01/31/17   Talbert Cage, APRN   mirtazapine (REMERON) 15 MG tablet TAKE 1 TABLET BY MOUTH EVERY NIGHT FOR SLEEP 01/31/17   Talbert Cage, APRN   lamoTRIgine (LAMICTAL) 200 MG tablet Take 1 tablet by mouth nightly 01/31/17   Talbert Cage, APRN   buPROPion (WELLBUTRIN XL) 150 MG extended release tablet TAKE 1 TABLET  BY MOUTH EVERY MORNING 01/14/17   Talbert Cage, APRN   lubiprostone (AMITIZA) 24 MCG capsule Take 24 mcg by mouth daily (with breakfast)    Historical Provider, MD   fenofibrate micronized (LOFIBRA) 200 MG capsule Take 200 mg by mouth every morning (before breakfast)    Historical Provider, MD   OLANZapine (ZYPREXA) 10 MG tablet Take 1 tablet by mouth nightly  Patient taking differently: Take 10 mg by mouth nightly  07/10/16   Talbert Cage, APRN   traMADol (ULTRAM) 50 MG tablet Take 50 mg by mouth 2 times daily as needed for Pain.Marland Kitchen    Historical Provider, MD   insulin lispro (HUMALOG) 100 UNIT/ML injection vial Inject 0-6 Units into the skin 3 times daily (with meals) 02/21/16   Georgia Lopes, MD   insulin glargine (LANTUS) 100 UNIT/ML  injection vial Inject 25 Units into the skin daily  Patient taking differently: Inject 40 Units into the skin daily  02/22/16   Georgia Lopes, MD   diclofenac sodium 1 % GEL Apply 2 g topically daily as needed for Pain    Historical Provider, MD   Cholecalciferol (VITAMIN D3) 50000 units CAPS Take 50,000 Units by mouth    Historical Provider, MD   acetaminophen (TYLENOL) 325 MG tablet Take 2 tablets by mouth every 4 hours as needed for Pain 10/11/15   Marcelina Morel Ballew, DO   albuterol sulfate HFA (VENTOLIN HFA) 108 (90 Base) MCG/ACT inhaler Inhale 2 puffs into the lungs every 4 hours as needed for Wheezing 10/11/15   Jeanene Erb, DO   famotidine (PEPCID) 20 MG tablet Take 1 tablet by mouth 2 times daily 10/11/15   Jeanene Erb, DO   Lancets MISC  08/31/15   Historical Provider, MD   NONFORMULARY Arthritis hand ointment    Historical Provider, MD   montelukast (SINGULAIR) 10 MG tablet Take 10 mg by mouth nightly    Historical Provider, MD        MSE:  Patient is  A & O x3.  Appearance:  street clothes, in chair, fair grooming and fair hygiene appropriately dressed for season and age.  Cognition:  Recent memory intact , remote memory intact , good fund of knowledge, average  intelligence level.   Speech:  normal  Language: Naming: intact; Word Finding: intact  Conversation no evidence of delusions  Behavior:  Cooperative and Good eye contact  Mood: euthymic  Affect: congruent with mood and blunted  Thought Content: no evidence of overt psychosis, delusional thought or suicidal /homicidal ideation or plan  Thought Process: linear, goal directed and coherent and associations appropriate  Judgement Insight:  normal and appropriate  Gait and Station:normal gait and station and normal balance   Musculoskeletal: WNL      Assesment:   No diagnosis found.    Plan:  Increase Trazodone 100 mg PO take 2 qhs  Continue Remeron 15 mg PO qhs - patient reports better sleep on both Trazodone and Remeron  Continue Lamictal 200 mg  PO qhs  Continue Wellbutrin XL 150 mg PO qam  Continue Zyprexa 10 mg PO qhs  1. The risks, benefits, side effects, indications, contraindications, and adverse effects of the medications have been discussed. Yes.  2. The pt has verbalized understanding and has capacity to give informed consent.  3. The Zadie Rhine report has been reviewed according to [facility] regulations.  4. Supportive therapy offered.  5. Follow up: No follow-ups on file.  6. The patient has been  advised to call with any problems.  7. Controlled substance Treatment Plan.  8. The above listed medications have been continued, modifications in meds and other orders/labs as follows:      No orders of the defined types were placed in this encounter.     No orders of the defined types were placed in this encounter.      9. Additional comments:      Graciella Freer, PMHNP-BC

## 2017-05-14 NOTE — Telephone Encounter (Signed)
 E-scribed to pharmacy

## 2017-06-06 ENCOUNTER — Encounter: Attending: Psychiatric/Mental Health | Primary: Emergency Medicine

## 2017-06-17 NOTE — Telephone Encounter (Signed)
Last ov 01/31/17  Next ov pt does not currently have follow up   06/17/2017 8:54 AM   Progress Note        Joyce Cohen [birth year]  Psychotherapy Time Spent: 18 min      Psychotherapy Topics: family, financial, health and marital    Chief Complaint   Patient presents with   . Medication Refill         Subjective:  Patient is 62 yo CF diagnosed with Bipolar 1 D/O, Insomnia, PTSD, and history of depression, anxiety, and SI and presents today for follow-up.  Last seen in clinic on 11/21/16 and prior records were reviewed.      Today patient states, "I can't sleep. I've been doubling up on Trazodone." Reports it has helped. Says she is able to fall asleep with the double dose in about 20-40 minutes. States sleep has been restful in quality. She was averaging about 4 hours of sleep with 100 mg of Trazodone. Reports 8-9 hours with 200 mg. She says she has mild grogginess with the increased dose. Mood is fair. States "I don't feel depressed, but I notice I'm isolating and not taking care of myself. I mean, I take a bath with baby wipes." Reports anergia, lack of motivation, and poor hygiene. Hair is greasy but brushed. States "actually I feel pretty hopeful this year." Correlates hope with decreased financial stress. Patient's husband has started a job driving a bus for [employer]. States she and her husband are actually getting presents for each other. They are also getting new windows in the bedroom, got their dryer fixed, and got tires on their car. Patient says she has struggled with SAD in the past. Reports her oldest daughter was diagnosed with ovarian cancer. Denies symptoms of mania. No reports of episodes of mania or hypomania since last ov. She has been baking and enjoys it. She continues to do Step Study every Monday night. Appetite is stable.     Patient reports side effects as follows: insomnia. No evidence of EPS, no cogwheeling or abnormal motor movements.  Patient has history of SI  but is absent suicidal ideation today.  Reports compliance with medications as good .     Review of Systems - 14 point review negative today except insomnia, chronic pain due to DDD  History obtained via chart review and patient  PCP is Dr. Raynelle Highland  BP is 126/89  Weight is 214  Pulse is 93      Current Meds:    Prior to Admission medications    Medication Sig Start Date End Date Taking? Authorizing Provider   buPROPion (WELLBUTRIN XL) 150 MG extended release tablet TAKE 1 TABLET BY MOUTH EVERY MORNING 05/13/17   Talbert Cage, APRN   OLANZapine (ZYPREXA) 10 MG tablet Take 1 tablet by mouth nightly Indications: 1/2 tablet by mouth every night at bedtime 05/13/17   Talbert Cage, APRN   traZODone (DESYREL) 100 MG tablet Take 2 tablets nightly 01/31/17   Talbert Cage, APRN   mirtazapine (REMERON) 15 MG tablet TAKE 1 TABLET BY MOUTH EVERY NIGHT FOR SLEEP 01/31/17   Talbert Cage, APRN   lamoTRIgine (LAMICTAL) 200 MG tablet Take 1 tablet by mouth nightly 01/31/17   Talbert Cage, APRN   lubiprostone (AMITIZA) 24 MCG capsule Take 24 mcg by mouth daily (with breakfast)    Historical Provider, MD   fenofibrate micronized (LOFIBRA) 200 MG capsule Take 200 mg by mouth every  morning (before breakfast)    Historical Provider, MD   traMADol (ULTRAM) 50 MG tablet Take 50 mg by mouth 2 times daily as needed for Pain.Marland Kitchen    Historical Provider, MD   insulin lispro (HUMALOG) 100 UNIT/ML injection vial Inject 0-6 Units into the skin 3 times daily (with meals) 02/21/16   Georgia Lopes, MD   insulin glargine (LANTUS) 100 UNIT/ML injection vial Inject 25 Units into the skin daily  Patient taking differently: Inject 40 Units into the skin daily  02/22/16   Georgia Lopes, MD   diclofenac sodium 1 % GEL Apply 2 g topically daily as needed for Pain    Historical Provider, MD   Cholecalciferol (VITAMIN D3) 50000 units CAPS Take 50,000 Units by mouth    Historical Provider, MD   acetaminophen (TYLENOL) 325 MG  tablet Take 2 tablets by mouth every 4 hours as needed for Pain 10/11/15   Marcelina Morel Ballew, DO   albuterol sulfate HFA (VENTOLIN HFA) 108 (90 Base) MCG/ACT inhaler Inhale 2 puffs into the lungs every 4 hours as needed for Wheezing 10/11/15   Jeanene Erb, DO   famotidine (PEPCID) 20 MG tablet Take 1 tablet by mouth 2 times daily 10/11/15   Jeanene Erb, DO   Lancets MISC  08/31/15   Historical Provider, MD   NONFORMULARY Arthritis hand ointment    Historical Provider, MD   montelukast (SINGULAIR) 10 MG tablet Take 10 mg by mouth nightly    Historical Provider, MD        MSE:  Patient is  A & O x3.  Appearance:  street clothes, in chair, fair grooming and fair hygiene appropriately dressed for season and age.  Cognition:  Recent memory intact , remote memory intact , good fund of knowledge, average  intelligence level.   Speech:  normal  Language: Naming: intact; Word Finding: intact  Conversation no evidence of delusions  Behavior:  Cooperative and Good eye contact  Mood: euthymic  Affect: congruent with mood and blunted  Thought Content: no evidence of overt psychosis, delusional thought or suicidal /homicidal ideation or plan  Thought Process: linear, goal directed and coherent and associations appropriate  Judgement Insight:  normal and appropriate  Gait and Station:normal gait and station and normal balance   Musculoskeletal: WNL      Assesment:   No diagnosis found.    Plan:  Increase Trazodone 100 mg PO take 2 qhs  Continue Remeron 15 mg PO qhs - patient reports better sleep on both Trazodone and Remeron  Continue Lamictal 200 mg PO qhs  Continue Wellbutrin XL 150 mg PO qam  Continue Zyprexa 10 mg PO qhs  1. The risks, benefits, side effects, indications, contraindications, and adverse effects of the medications have been discussed. Yes.  2. The pt has verbalized understanding and has capacity to give informed consent.  3. The Zadie Rhine report has been reviewed according to [facility] regulations.  4. Supportive  therapy offered.  5. Follow up: No follow-ups on file.  6. The patient has been advised to call with any problems.  7. Controlled substance Treatment Plan.  8. The above listed medications have been continued, modifications in meds and other orders/labs as follows:      No orders of the defined types were placed in this encounter.     No orders of the defined types were placed in this encounter.      9. Additional comments:      Graciella Freer, PMHNP-BC

## 2017-06-18 MED ORDER — TRAZODONE HCL 100 MG PO TABS
100 MG | ORAL_TABLET | ORAL | 3 refills | Status: DC
Start: 2017-06-18 — End: 2017-10-16

## 2017-06-18 NOTE — Telephone Encounter (Signed)
 Pt husband came into office 06/17/17 & 06/18/17 regarding refill on this medication. Let him know it has been turned into provider.

## 2017-06-18 NOTE — Telephone Encounter (Signed)
E-Prescribing Status: Receipt confirmed by pharmacy (06/18/2017 11:57 AM CDT)

## 2017-07-11 NOTE — Telephone Encounter (Signed)
:Last OV:   12.14.19    Next OV:  NO SHOW on 04-18-17. No other appointment scheduled...    Requested Prescriptions     Pending Prescriptions Disp Refills   . lamoTRIgine (LAMICTAL) 200 MG tablet [Pharmacy Med Name: LAMOTRIGINE 200MG  TABLET] 30 tablet 3     Sig: TAKE 1 TABLET BY MOUTH NIGHTLY     07/11/2017 1:06 PM   Progress Note        Joyce Cohen 01-06-1956  Psychotherapy Time Spent: 18 min      Psychotherapy Topics: family, financial, health and marital    Chief Complaint   Patient presents with   . Medication Refill         Subjective:  Patient is 62 yo CF diagnosed with Bipolar 1 D/O, Insomnia, PTSD, and history of depression, anxiety, and SI and presents today for follow-up.  Last seen in clinic on 11/21/16 and prior records were reviewed.      Today patient states, "I can't sleep. I've been doubling up on Trazodone." Reports it has helped. Says she is able to fall asleep with the double dose in about 20-40 minutes. States sleep has been restful in quality. She was averaging about 4 hours of sleep with 100 mg of Trazodone. Reports 8-9 hours with 200 mg. She says she has mild grogginess with the increased dose. Mood is fair. States "I don't feel depressed, but I notice I'm isolating and not taking care of myself. I mean, I take a bath with baby wipes." Reports anergia, lack of motivation, and poor hygiene. Hair is greasy but brushed. States "actually I feel pretty hopeful this year." Correlates hope with decreased financial stress. Patient's husband has started a job driving a bus for [employer]. [employer]. States she and her husband are actually getting presents for each other. They are also getting new windows in the bedroom, got their dryer fixed, and got tires on their car. Patient says she has struggled with SAD in the past. Reports her oldest daughter was diagnosed with ovarian cancer. Denies symptoms of mania. No reports of episodes of mania or hypomania since last ov. She has been baking  and enjoys it. She continues to do Step Study every Monday night. Appetite is stable.     Patient reports side effects as follows: insomnia. No evidence of EPS, no cogwheeling or abnormal motor movements.  Patient has history of SI but is absent suicidal ideation today.  Reports compliance with medications as good .     Review of Systems - 14 point review negative today except insomnia, chronic pain due to DDD  History obtained via chart review and patient  PCP is Dr. Raynelle Highland  BP is 126/89  Weight is 214  Pulse is 93      Current Meds:    Prior to Admission medications    Medication Sig Start Date End Date Taking? Authorizing Provider   traZODone (DESYREL) 100 MG tablet TAKE 2 TABLETS BY MOUTH NIGHTLY 06/18/17   Talbert Cage, APRN   buPROPion (WELLBUTRIN XL) 150 MG extended release tablet TAKE 1 TABLET BY MOUTH EVERY MORNING 05/13/17   Talbert Cage, APRN   OLANZapine (ZYPREXA) 10 MG tablet Take 1 tablet by mouth nightly Indications: 1/2 tablet by mouth every night at bedtime 05/13/17   Talbert Cage, APRN   mirtazapine (REMERON) 15 MG tablet TAKE 1 TABLET BY MOUTH EVERY NIGHT FOR SLEEP 01/31/17   Talbert Cage, APRN   lamoTRIgine (LAMICTAL)  200 MG tablet Take 1 tablet by mouth nightly 01/31/17   Talbert Cage, APRN   lubiprostone (AMITIZA) 24 MCG capsule Take 24 mcg by mouth daily (with breakfast)    Historical Provider, MD   fenofibrate micronized (LOFIBRA) 200 MG capsule Take 200 mg by mouth every morning (before breakfast)    Historical Provider, MD   traMADol (ULTRAM) 50 MG tablet Take 50 mg by mouth 2 times daily as needed for Pain.Marland Kitchen    Historical Provider, MD   insulin lispro (HUMALOG) 100 UNIT/ML injection vial Inject 0-6 Units into the skin 3 times daily (with meals) 02/21/16   Georgia Lopes, MD   insulin glargine (LANTUS) 100 UNIT/ML injection vial Inject 25 Units into the skin daily  Patient taking differently: Inject 40 Units into the skin daily  02/22/16   Georgia Lopes, MD   diclofenac sodium 1 % GEL Apply 2 g topically daily as needed for Pain    Historical Provider, MD   Cholecalciferol (VITAMIN D3) 50000 units CAPS Take 50,000 Units by mouth    Historical Provider, MD   acetaminophen (TYLENOL) 325 MG tablet Take 2 tablets by mouth every 4 hours as needed for Pain 10/11/15   Marcelina Morel Ballew, DO   albuterol sulfate HFA (VENTOLIN HFA) 108 (90 Base) MCG/ACT inhaler Inhale 2 puffs into the lungs every 4 hours as needed for Wheezing 10/11/15   Jeanene Erb, DO   famotidine (PEPCID) 20 MG tablet Take 1 tablet by mouth 2 times daily 10/11/15   Jeanene Erb, DO   Lancets MISC  08/31/15   Historical Provider, MD   NONFORMULARY Arthritis hand ointment    Historical Provider, MD   montelukast (SINGULAIR) 10 MG tablet Take 10 mg by mouth nightly    Historical Provider, MD        MSE:  Patient is  A & O x3.  Appearance:  street clothes, in chair, fair grooming and fair hygiene appropriately dressed for season and age.  Cognition:  Recent memory intact , remote memory intact , good fund of knowledge, average  intelligence level.   Speech:  normal  Language: Naming: intact; Word Finding: intact  Conversation no evidence of delusions  Behavior:  Cooperative and Good eye contact  Mood: euthymic  Affect: congruent with mood and blunted  Thought Content: no evidence of overt psychosis, delusional thought or suicidal /homicidal ideation or plan  Thought Process: linear, goal directed and coherent and associations appropriate  Judgement Insight:  normal and appropriate  Gait and Station:normal gait and station and normal balance   Musculoskeletal: WNL      Assesment:   No diagnosis found.    Plan:  Increase Trazodone 100 mg PO take 2 qhs  Continue Remeron 15 mg PO qhs - patient reports better sleep on both Trazodone and Remeron  Continue Lamictal 200 mg PO qhs  Continue Wellbutrin XL 150 mg PO qam  Continue Zyprexa 10 mg PO qhs  1. The risks, benefits, side effects, indications,  contraindications, and adverse effects of the medications have been discussed. Yes.  2. The pt has verbalized understanding and has capacity to give informed consent.  3. The Zadie Rhine report has been reviewed according to [facility] regulations.  4. Supportive therapy offered.  5. Follow up: No follow-ups on file.  6. The patient has been advised to call with any problems.  7. Controlled substance Treatment Plan.  8. The above listed medications have been continued, modifications in meds and other  orders/labs as follows:      No orders of the defined types were placed in this encounter.     No orders of the defined types were placed in this encounter.      9. Additional comments:      Graciella Freer, PMHNP-BC

## 2017-07-12 MED ORDER — LAMOTRIGINE 200 MG PO TABS
200 MG | ORAL_TABLET | Freq: Every evening | ORAL | 0 refills | Status: DC
Start: 2017-07-12 — End: 2017-08-13

## 2017-07-12 NOTE — Telephone Encounter (Signed)
 Will only fill 30 days of script. Patient needs to have a visit scheduled.

## 2017-07-15 NOTE — Telephone Encounter (Signed)
Sent to pharmacy as: lamoTRIgine 200 MG Oral Tablet    E-Prescribing Status: Receipt confirmed by pharmacy (07/12/2017 2:45 PM CDT)

## 2017-08-12 NOTE — Telephone Encounter (Signed)
 Last OV:  12.14.19    Next OV:  08.01.19        Requested Prescriptions     Pending Prescriptions Disp Refills   . OLANZapine (ZYPREXA) 10 MG tablet 90 tablet 1     Sig: Take 1 tablet by mouth nightly Indications: 1/2 tablet by mouth every night at bedtime   . lamoTRIgine (LAMICTAL) 200 MG tablet 30 tablet 0     Sig: Take 1 tablet by mouth nightly   . mirtazapine (REMERON) 15 MG tablet 30 tablet 3     Sig: TAKE 1 TABLET BY MOUTH EVERY NIGHT FOR SLEEP     08/12/2017 9:09 AM   Progress Note        Joyce Cohen [birth year]  Psychotherapy Time Spent: 18 min      Psychotherapy Topics: family, financial, health and marital    Chief Complaint   Patient presents with   . Medication Refill         Subjective:  Patient is 62 yo CF diagnosed with Bipolar 1 D/O, Insomnia, PTSD, and history of depression, anxiety, and SI and presents today for follow-up.  Last seen in clinic on 11/21/16 and prior records were reviewed.      Today patient states, "I can't sleep. I've been doubling up on Trazodone." Reports it has helped. Says she is able to fall asleep with the double dose in about 20-40 minutes. States sleep has been restful in quality. She was averaging about 4 hours of sleep with 100 mg of Trazodone. Reports 8-9 hours with 200 mg. She says she has mild grogginess with the increased dose. Mood is fair. States "I don't feel depressed, but I notice I'm isolating and not taking care of myself. I mean, I take a bath with baby wipes." Reports anergia, lack of motivation, and poor hygiene. Hair is greasy but brushed. States "actually I feel pretty hopeful this year." Correlates hope with decreased financial stress. Patient's husband has started a job driving a bus for [employer]. [employer]. States she and her husband are actually getting presents for each other. They are also getting new windows in the bedroom, got their dryer fixed, and got tires on their car. Patient says she has struggled with SAD in the past. Reports  her oldest daughter was diagnosed with ovarian cancer. Denies symptoms of mania. No reports of episodes of mania or hypomania since last ov. She has been baking and enjoys it. She continues to do Step Study every Monday night. Appetite is stable.     Patient reports side effects as follows: insomnia. No evidence of EPS, no cogwheeling or abnormal motor movements.  Patient has history of SI but is absent suicidal ideation today.  Reports compliance with medications as good .     Review of Systems - 14 point review negative today except insomnia, chronic pain due to DDD  History obtained via chart review and patient  PCP is Dr. Raynelle Highland  BP is 126/89  Weight is 214  Pulse is 93      Current Meds:    Prior to Admission medications    Medication Sig Start Date End Date Taking? Authorizing Provider   lamoTRIgine (LAMICTAL) 200 MG tablet TAKE 1 TABLET BY MOUTH NIGHTLY 07/12/17   Redge Gainer, APRN - NP   traZODone (DESYREL) 100 MG tablet TAKE 2 TABLETS BY MOUTH NIGHTLY 06/18/17   Talbert Cage, APRN   buPROPion (WELLBUTRIN XL) 150 MG extended release tablet TAKE 1 TABLET  BY MOUTH EVERY MORNING 05/13/17   Talbert Cage, APRN   OLANZapine (ZYPREXA) 10 MG tablet Take 1 tablet by mouth nightly Indications: 1/2 tablet by mouth every night at bedtime 05/13/17   Talbert Cage, APRN   mirtazapine (REMERON) 15 MG tablet TAKE 1 TABLET BY MOUTH EVERY NIGHT FOR SLEEP 01/31/17   Talbert Cage, APRN   lubiprostone (AMITIZA) 24 MCG capsule Take 24 mcg by mouth daily (with breakfast)    Historical Provider, MD   fenofibrate micronized (LOFIBRA) 200 MG capsule Take 200 mg by mouth every morning (before breakfast)    Historical Provider, MD   traMADol (ULTRAM) 50 MG tablet Take 50 mg by mouth 2 times daily as needed for Pain.Marland Kitchen    Historical Provider, MD   insulin lispro (HUMALOG) 100 UNIT/ML injection vial Inject 0-6 Units into the skin 3 times daily (with meals) 02/21/16   Georgia Lopes, MD   insulin glargine  (LANTUS) 100 UNIT/ML injection vial Inject 25 Units into the skin daily  Patient taking differently: Inject 40 Units into the skin daily  02/22/16   Georgia Lopes, MD   diclofenac sodium 1 % GEL Apply 2 g topically daily as needed for Pain    Historical Provider, MD   Cholecalciferol (VITAMIN D3) 50000 units CAPS Take 50,000 Units by mouth    Historical Provider, MD   acetaminophen (TYLENOL) 325 MG tablet Take 2 tablets by mouth every 4 hours as needed for Pain 10/11/15   Marcelina Morel Ballew, DO   albuterol sulfate HFA (VENTOLIN HFA) 108 (90 Base) MCG/ACT inhaler Inhale 2 puffs into the lungs every 4 hours as needed for Wheezing 10/11/15   Jeanene Erb, DO   famotidine (PEPCID) 20 MG tablet Take 1 tablet by mouth 2 times daily 10/11/15   Jeanene Erb, DO   Lancets MISC  08/31/15   Historical Provider, MD   NONFORMULARY Arthritis hand ointment    Historical Provider, MD   montelukast (SINGULAIR) 10 MG tablet Take 10 mg by mouth nightly    Historical Provider, MD        MSE:  Patient is  A & O x3.  Appearance:  street clothes, in chair, fair grooming and fair hygiene appropriately dressed for season and age.  Cognition:  Recent memory intact , remote memory intact , good fund of knowledge, average  intelligence level.   Speech:  normal  Language: Naming: intact; Word Finding: intact  Conversation no evidence of delusions  Behavior:  Cooperative and Good eye contact  Mood: euthymic  Affect: congruent with mood and blunted  Thought Content: no evidence of overt psychosis, delusional thought or suicidal /homicidal ideation or plan  Thought Process: linear, goal directed and coherent and associations appropriate  Judgement Insight:  normal and appropriate  Gait and Station:normal gait and station and normal balance   Musculoskeletal: WNL      Assesment:   No diagnosis found.    Plan:  Increase Trazodone 100 mg PO take 2 qhs  Continue Remeron 15 mg PO qhs - patient reports better sleep on both Trazodone and  Remeron  Continue Lamictal 200 mg PO qhs  Continue Wellbutrin XL 150 mg PO qam  Continue Zyprexa 10 mg PO qhs  1. The risks, benefits, side effects, indications, contraindications, and adverse effects of the medications have been discussed. Yes.  2. The pt has verbalized understanding and has capacity to give informed consent.  3. The Zadie Rhine report has been reviewed according to  HB1 regulations.  4. Supportive therapy offered.  5. Follow up: No follow-ups on file.  6. The patient has been advised to call with any problems.  7. Controlled substance Treatment Plan.  8. The above listed medications have been continued, modifications in meds and other orders/labs as follows:      No orders of the defined types were placed in this encounter.     No orders of the defined types were placed in this encounter.      9. Additional comments:      Graciella Freer, PMHNP-BC

## 2017-08-13 MED ORDER — OLANZAPINE 10 MG PO TABS
10 MG | ORAL_TABLET | Freq: Every evening | ORAL | 2 refills | Status: AC
Start: 2017-08-13 — End: ?

## 2017-08-13 MED ORDER — MIRTAZAPINE 15 MG PO TABS
15 MG | ORAL_TABLET | ORAL | 2 refills | Status: DC
Start: 2017-08-13 — End: 2017-09-18

## 2017-08-13 MED ORDER — LAMOTRIGINE 200 MG PO TABS
200 MG | ORAL_TABLET | Freq: Every evening | ORAL | 2 refills | Status: DC
Start: 2017-08-13 — End: 2017-11-11

## 2017-08-13 NOTE — Telephone Encounter (Signed)
 This patient needs to be seen. Will request that office staff schedule her for an appointment.

## 2017-08-13 NOTE — Telephone Encounter (Signed)
 Rx refill request states that patient has an appointment on 09-18-17.  E-Prescribing Status: Receipt confirmed by pharmacy (08/13/2017 1:22 PM CDT)

## 2017-09-18 ENCOUNTER — Ambulatory Visit
Admit: 2017-09-18 | Discharge: 2017-09-18 | Payer: BLUE CROSS/BLUE SHIELD | Attending: Psychiatric/Mental Health | Primary: Emergency Medicine

## 2017-09-18 DIAGNOSIS — F319 Bipolar disorder, unspecified: Secondary | ICD-10-CM

## 2017-09-18 MED ORDER — BUPROPION HCL ER (XL) 150 MG PO TB24
150 MG | ORAL_TABLET | Freq: Every morning | ORAL | 3 refills | Status: DC
Start: 2017-09-18 — End: 2018-02-06

## 2017-09-18 MED ORDER — MIRTAZAPINE 15 MG PO TABS
15 MG | ORAL_TABLET | Freq: Every evening | ORAL | 2 refills | Status: DC
Start: 2017-09-18 — End: 2017-11-11

## 2017-09-18 NOTE — Patient Instructions (Signed)
Plan:  Continue:  Trazodone, 100mg , take 2 tablets as needed for sleep at night  Zyprexa, 10mg , nightly  Mirtazapine, 15mg , 1/2 tab nightly for sleep  Lamotrigine, 200mg , nightly  Wellbutrin XL, 150mg , daily    Follow up: Return in about 3 months (around 12/19/2017).

## 2017-09-18 NOTE — Progress Notes (Signed)
 09/18/2017 3:31 PM   Progress Note    IN:  1500  OUT: 1525      Joyce Cohen May 05, 1955      Chief Complaint   Patient presents with   . Other     mild depressive symptoms         Subjective:  Patient is a 62 y.o. female diagnosed with Bipolar 1 Disorder, Insomnia, PTSD and presents today for follow-up.  Last seen in clinic on 01/31/17 with Beverley Fiedler, APRN, and prior records were reviewed.    Today patient states, "I am doing ok." She reports that her medications are working fine. She reports that she started taking CBD oil. She reports a little problem with self care and getting up to shower. She reports that it is mild, never bad.    Patient reports side effects as follows: none. No evidence of EPS, no cogwheeling or abnormal motor movements.  Absent  suicidal ideation.  Reports compliance with medications as good .     Current Substance Use:  See history    BP: BP 131/71   Pulse 87   Temp 99.2 F (37.3 C)   Wt 208 lb (94.3 kg)   LMP  (LMP Unknown)   SpO2 95%   BMI 34.61 kg/m       Review of Systems - 14 point review:  Negative except for: T2DM, Mitral Valve Prolapse, Degenerative Disc Disease, Obesity, sciatic nerve pain, environmental allergies    Constitutional: (fevers, chills, night sweats, wt loss/gain, change in appetite, fatigue, somnolence)    HEENT: (ear pain or discharge, hearing loss, ear ringing, sinus pressure, nosebleed, nasal discharge, sore throat, oral sores, tooth pain, bleeding gums, hoarse voice, neck pain)      Cardiovascular: (HTN, chest pain, elevated cholesterol/lipids, palpitations, leg swelling, leg pain with walking)    Respiratory: (cough, wheezing, snoring, SOB with activity (dyspnea), SOB while lying flat (orthopnea), awakening with severe SOB (paroxysmal nocturnal dyspnea))    Gastrointestinal: (NVD, constipation, abdominal pain, bright red stools, black tarry stools, stool incontinence)     Genitourinary:  (pelvic pain, burning or frequency of urination, urinary  urgency, blood in urine incomplete bladder emptying, urinary incontinence, STD; MEN: testicular pain or swelling, erectile dysfunction; WOMEN: LMP, heavy menstrual bleeding (menorrhagia), irregular periods, postmenopausal bleeding, menstrual pain (dymenorrhea, vaginal discharge)    Musculoskeletal: (bone pain/fracture, joint pain or swelling, musle pain)    Integumentary: (rashes, acne, non-healing sores, itching, breast lumps, breast pain, nipple discharge, hair loss)    Neurologic: (HA, muscle weakness, paresthesias (numbness, coldness, crawling or prickling), memory loss, seizure, dizziness)    Psychiatric:  (anxiety, sadness, irritability/anger, insomnia, suicidality)    Endocrine: (heat or cold intolerance, excessive thirst (polydipsia), excessive hunger (polyphagia))    Immune/Allergic: (hives, seasonal or environmental allergies, HIV exposure)    Hematologic/Lymphatic: (lymph node enlargement, easy bleeding or bruising)    History obtained via chart review and patient    PCP is Sharmon Leyden, MD       Current Meds:    Prior to Admission medications    Medication Sig Start Date End Date Taking? Authorizing Provider   mirtazapine (REMERON) 15 MG tablet Take 0.5 tablets by mouth nightly 09/18/17  Yes Redge Gainer, APRN - NP   buPROPion (WELLBUTRIN XL) 150 MG extended release tablet Take 1 tablet by mouth every morning 09/18/17  Yes Redge Gainer, APRN - NP   OLANZapine (ZYPREXA) 10 MG tablet Take 1 tablet by mouth nightly Indications: 1/2 tablet by  mouth every night at bedtime 08/13/17   Redge Gainer, APRN - NP   lamoTRIgine (LAMICTAL) 200 MG tablet Take 1 tablet by mouth nightly 08/13/17   Redge Gainer, APRN - NP   traZODone (DESYREL) 100 MG tablet TAKE 2 TABLETS BY MOUTH NIGHTLY 06/18/17   Talbert Cage, APRN   fenofibrate micronized (LOFIBRA) 200 MG capsule Take 200 mg by mouth every morning (before breakfast)    Historical Provider, MD   insulin lispro (HUMALOG) 100 UNIT/ML injection vial Inject 0-6  Units into the skin 3 times daily (with meals) 02/21/16   Georgia Lopes, MD   insulin glargine (LANTUS) 100 UNIT/ML injection vial Inject 25 Units into the skin daily  Patient taking differently: Inject 40 Units into the skin daily  02/22/16   Georgia Lopes, MD   Cholecalciferol (VITAMIN D3) 50000 units CAPS Take 50,000 Units by mouth    Historical Provider, MD   acetaminophen (TYLENOL) 325 MG tablet Take 2 tablets by mouth every 4 hours as needed for Pain 10/11/15   Marcelina Morel Ballew, DO   albuterol sulfate HFA (VENTOLIN HFA) 108 (90 Base) MCG/ACT inhaler Inhale 2 puffs into the lungs every 4 hours as needed for Wheezing 10/11/15   Jeanene Erb, DO   famotidine (PEPCID) 20 MG tablet Take 1 tablet by mouth 2 times daily 10/11/15   Jeanene Erb, DO   Lancets MISC  08/31/15   Historical Provider, MD   NONFORMULARY Arthritis hand ointment    Historical Provider, MD   montelukast (SINGULAIR) 10 MG tablet Take 10 mg by mouth nightly    Historical Provider, MD     Social History     Socioeconomic History   . Marital status: Married     Spouse name: Johnnie   . Number of children: 2   . Years of education: 63   . Highest education level: None   Occupational History   . None   Social Needs   . Financial resource strain: None   . Food insecurity:     Worry: None     Inability: None   . Transportation needs:     Medical: None     Non-medical: None   Tobacco Use   . Smoking status: Never Smoker   . Smokeless tobacco: Never Used   . Tobacco comment: Disabled due bipolar/Asperger's   Substance and Sexual Activity   . Alcohol use: No   . Drug use: No   . Sexual activity: Not Currently     Partners: Male   Lifestyle   . Physical activity:     Days per week: None     Minutes per session: None   . Stress: None   Relationships   . Social connections:     Talks on phone: None     Gets together: None     Attends religious service: None     Active member of club or organization: None     Attends meetings of clubs or organizations:  None     Relationship status: None   . Intimate partner violence:     Fear of current or ex partner: None     Emotionally abused: None     Physically abused: None     Forced sexual activity: None   Other Topics Concern   . None   Social History Narrative   . None       MSE:  Patient is  A & O x 4.  Appearance:  street clothes appropriately dressed for season and age.  Cognition:  Recent memory intact , remote memory intact , good fund of knowledge, average  intelligence level.   Speech:  normal  Language: Naming: intact; Word Finding: intact  Conversation no evidence of delusions  Behavior:  Cooperative and Good eye contact  Mood: euthymic  Affect: congruent with mood  Thought Content: negative delusions, negative hallucinations, negative obsessions,  negativehomicidal and negative suicidal   Thought Process: linear, goal directed and coherent  Associations: logical connections  Attention Span and concentration: Normal   Judgement Insight:  normal and appropriate  Gait and Station:normal gait and station   Sleep: avg 9-10 hrs   Appetite: ok    Cognition: Can spell "world" backwards: No                    Can do serial 7's: No    Lab Results   Component Value Date    NA 142 07/18/2016    K 4.4 07/18/2016    CL 106 07/18/2016    CO2 22 07/18/2016    BUN 23 07/18/2016    CREATININE 1.2 (H) 07/18/2016    GLUCOSE 131 (H) 07/18/2016    CALCIUM 9.7 07/18/2016    PROT 7.6 07/16/2016    LABALBU 4.7 07/16/2016    BILITOT 0.3 07/16/2016    ALKPHOS 53 07/16/2016    AST 21 07/16/2016    ALT 30 07/16/2016    LABGLOM 46 (A) 07/18/2016    GLOB 2.6 01/26/2015     Lab Results   Component Value Date    NA 142 07/18/2016    NA 145 07/15/2010    K 4.4 07/18/2016    K 4.1 07/15/2010    CL 106 07/18/2016    CL 111 07/15/2010    CO2 22 07/18/2016    BUN 23 07/18/2016    CREATININE 1.2 07/18/2016    CREATININE 1.3 07/15/2010    GLUCOSE 131 07/18/2016    CALCIUM 9.7 07/18/2016      Lab Results   Component Value Date    CHOL 205 (H)  02/15/2016     Lab Results   Component Value Date    TRIG 243 (H) 02/15/2016     Lab Results   Component Value Date    HDL 39 (L) 02/15/2016     Lab Results   Component Value Date    LDLCALC 117 02/15/2016     No results found for: LABVLDL, VLDL  No results found for: CHOLHDLRATIO  Lab Results   Component Value Date    LABA1C 7.0 (H) 02/15/2016     No results found for: EAG  Lab Results   Component Value Date    TSH 1.040 07/18/2016     Lab Results   Component Value Date    VITD25 39.1 07/18/2016       Assessments Administered:    PHQ9: 4, normal  HAM-A: 2, normal    Assessment:   1. Bipolar 1 disorder (HCC)    2. Insomnia due to other mental disorder        Plan:  Continue:  Trazodone, 100mg , take 2 tablets as needed for sleep at night  Zyprexa, 10mg , nightly  Mirtazapine, 15mg , 1/2 tab nightly for sleep  Lamotrigine, 200mg , nightly  Wellbutrin XL, 150mg , daily    Follow up: Return in about 3 months (around 12/19/2017).    1. The risks, benefits, side effects, indications, contraindications, and adverse effects of the medications have  been discussed. Yes.  2. The pt has verbalized understanding and has capacity to give informed consent.  3. The Zadie Rhine report has been reviewed according to [facility] regulations.  4. Supportive therapy offered.  5. Follow up: Return in about 3 months (around 12/19/2017).  6. The patient has been advised to call with any problems.  7. Controlled substance Treatment Plan: none.  8. The above listed medications have been continued, modifications in meds and other orders/labs as follows:      Orders Placed This Encounter   Medications   . mirtazapine (REMERON) 15 MG tablet     Sig: Take 0.5 tablets by mouth nightly     Dispense:  15 tablet     Refill:  2   . buPROPion (WELLBUTRIN XL) 150 MG extended release tablet     Sig: Take 1 tablet by mouth every morning     Dispense:  30 tablet     Refill:  3      No orders of the defined types were placed in this encounter.      9. Additional comments:  She says her medication is working well and this is reflected in her PHQ9 and HAMA scores. Although she is taking 3 different sleep medications, she does not want to make a change. Will make no medication changes at this visit and will follow up in about 3 months.     10.Over 50% of the total visit time of   30  minutes was spent on counseling and/or coordination of care of:                        1. Bipolar 1 disorder (HCC)    2. Insomnia due to other mental disorder                      Psychotherapy Topics: mood/medication effectiveness       Redge Gainer PMHNP-BC

## 2017-10-16 MED ORDER — TRAZODONE HCL 100 MG PO TABS
100 MG | ORAL_TABLET | ORAL | 3 refills | Status: DC
Start: 2017-10-16 — End: 2018-02-06

## 2017-10-16 MED ORDER — TRAZODONE HCL 100 MG PO TABS
100 MG | ORAL_TABLET | ORAL | 3 refills | Status: DC
Start: 2017-10-16 — End: 2017-11-11

## 2017-10-16 NOTE — Telephone Encounter (Signed)
 09/18/2017 3:31 PM                               Progress Note    IN:  1500  OUT: 1525      Joyce Cohen 07-17-1955                            Chief Complaint   Patient presents with   . Other     mild depressive symptoms         Subjective:  Patient is a 62 y.o. female diagnosed with Bipolar 1 Disorder, Insomnia, PTSD and presents today for follow-up.  Last seen in clinic on 01/31/17 with Beverley Fiedler, APRN, and prior records were reviewed.    Today patient states, "I am doing ok." She reports that her medications are working fine. She reports that she started taking CBD oil. She reports a little problem with self care and getting up to shower. She reports that it is mild, never bad.    Patient reports side effects as follows: none. No evidence of EPS, no cogwheeling or abnormal motor movements.  Absent  suicidal ideation.  Reports compliance with medications as good .     Current Substance Use:  See history    BP: BP 131/71   Pulse 87   Temp 99.2 F (37.3 C)   Wt 208 lb (94.3 kg)   LMP  (LMP Unknown)   SpO2 95%   BMI 34.61 kg/m       Review of Systems - 14 point review:  Negative except for: T2DM, Mitral Valve Prolapse, Degenerative Disc Disease, Obesity, sciatic nerve pain, environmental allergies    Constitutional: (fevers, chills, night sweats, wt loss/gain, change in appetite, fatigue, somnolence)    HEENT: (ear pain or discharge, hearing loss, ear ringing, sinus pressure, nosebleed, nasal discharge, sore throat, oral sores, tooth pain, bleeding gums, hoarse voice, neck pain)      Cardiovascular: (HTN, chest pain, elevated cholesterol/lipids, palpitations, leg swelling, leg pain with walking)    Respiratory: (cough, wheezing, snoring, SOB with activity (dyspnea), SOB while lying flat (orthopnea), awakening with severe SOB (paroxysmal nocturnal dyspnea))    Gastrointestinal: (NVD, constipation, abdominal pain, bright red stools, black tarry stools, stool incontinence)      Genitourinary: (pelvic pain, burning or frequency of urination, urinary urgency, blood in urine incomplete bladder emptying, urinary incontinence, STD; MEN: testicular pain or swelling, erectile dysfunction; WOMEN: LMP, heavy menstrual bleeding (menorrhagia), irregular periods, postmenopausal bleeding, menstrual pain (dymenorrhea, vaginal discharge)    Musculoskeletal: (bone pain/fracture, joint pain or swelling, musle pain)    Integumentary: (rashes, acne, non-healing sores, itching, breast lumps, breast pain, nipple discharge, hair loss)    Neurologic: (HA, muscle weakness, paresthesias (numbness, coldness, crawling or prickling), memory loss, seizure, dizziness)    Psychiatric:  (anxiety, sadness, irritability/anger, insomnia, suicidality)    Endocrine: (heat or cold intolerance, excessive thirst (polydipsia), excessive hunger (polyphagia))    Immune/Allergic: (hives, seasonal or environmental allergies, HIV exposure)    Hematologic/Lymphatic: (lymph node enlargement, easy bleeding or bruising)    History obtained via chart review and patient    PCP is Sharmon Leyden, MD       Current Meds:    HomeMedications           Prior to Admission medications    Medication Sig Start Date End Date Taking? Authorizing Provider   mirtazapine (  REMERON) 15 MG tablet Take 0.5 tablets by mouth nightly 09/18/17  Yes Redge Gainer, APRN - NP   buPROPion (WELLBUTRIN XL) 150 MG extended release tablet Take 1 tablet by mouth every morning 09/18/17  Yes Redge Gainer, APRN - NP   OLANZapine (ZYPREXA) 10 MG tablet Take 1 tablet by mouth nightly Indications: 1/2 tablet by mouth every night at bedtime 08/13/17   Redge Gainer, APRN - NP   lamoTRIgine (LAMICTAL) 200 MG tablet Take 1 tablet by mouth nightly 08/13/17   Redge Gainer, APRN - NP   traZODone (DESYREL) 100 MG tablet TAKE 2 TABLETS BY MOUTH NIGHTLY 06/18/17   Talbert Cage, APRN   fenofibrate micronized (LOFIBRA) 200 MG capsule Take 200 mg by mouth every  morning (before breakfast)    Historical Provider, MD   insulin lispro (HUMALOG) 100 UNIT/ML injection vial Inject 0-6 Units into the skin 3 times daily (with meals) 02/21/16   Georgia Lopes, MD   insulin glargine (LANTUS) 100 UNIT/ML injection vial Inject 25 Units into the skin daily  Patient taking differently: Inject 40 Units into the skin daily  02/22/16   Georgia Lopes, MD   Cholecalciferol (VITAMIN D3) 50000 units CAPS Take 50,000 Units by mouth    Historical Provider, MD   acetaminophen (TYLENOL) 325 MG tablet Take 2 tablets by mouth every 4 hours as needed for Pain 10/11/15   Marcelina Morel Ballew, DO   albuterol sulfate HFA (VENTOLIN HFA) 108 (90 Base) MCG/ACT inhaler Inhale 2 puffs into the lungs every 4 hours as needed for Wheezing 10/11/15   Jeanene Erb, DO   famotidine (PEPCID) 20 MG tablet Take 1 tablet by mouth 2 times daily 10/11/15   Jeanene Erb, DO   Lancets MISC  08/31/15   Historical Provider, MD   NONFORMULARY Arthritis hand ointment    Historical Provider, MD   montelukast (SINGULAIR) 10 MG tablet Take 10 mg by mouth nightly    Historical Provider, MD        SocialHistory   Social History           Socioeconomic History   . Marital status: Married     Spouse name: Johnnie   . Number of children: 2   . Years of education: 105   . Highest education level: None   Occupational History   . None   Social Needs   . Financial resource strain: None   . Food insecurity:     Worry: None     Inability: None   . Transportation needs:     Medical: None     Non-medical: None   Tobacco Use   . Smoking status: Never Smoker   . Smokeless tobacco: Never Used   . Tobacco comment: Disabled due bipolar/Asperger's   Substance and Sexual Activity   . Alcohol use: No   . Drug use: No   . Sexual activity: Not Currently     Partners: Male   Lifestyle   . Physical activity:     Days per week: None     Minutes per session: None   . Stress: None   Relationships   . Social  connections:     Talks on phone: None     Gets together: None     Attends religious service: None     Active member of club or organization: None     Attends meetings of clubs or organizations: None     Relationship status: None   .  Intimate partner violence:     Fear of current or ex partner: None     Emotionally abused: None     Physically abused: None     Forced sexual activity: None   Other Topics Concern   . None   Social History Narrative   . None          MSE:  Patient is  A & O x 4.  Appearance:  street clothes appropriately dressed for season and age.  Cognition:  Recent memory intact , remote memory intact , good fund of knowledge, average  intelligence level.   Speech:  normal  Language: Naming: intact; Word Finding: intact  Conversation no evidence of delusions  Behavior:  Cooperative and Good eye contact  Mood: euthymic  Affect: congruent with mood  Thought Content: negative delusions, negative hallucinations, negative obsessions,  negativehomicidal and negative suicidal   Thought Process: linear, goal directed and coherent  Associations: logical connections  Attention Span and concentration: Normal   Judgement Insight:  normal and appropriate  Gait and Station:normal gait and station   Sleep: avg 9-10 hrs   Appetite: ok    Cognition: Can spell "world" backwards: No                    Can do serial 7's: No          Lab Results   Component Value Date    NA 142 07/18/2016    K 4.4 07/18/2016    CL 106 07/18/2016    CO2 22 07/18/2016    BUN 23 07/18/2016    CREATININE 1.2 (H) 07/18/2016    GLUCOSE 131 (H) 07/18/2016    CALCIUM 9.7 07/18/2016    PROT 7.6 07/16/2016    LABALBU 4.7 07/16/2016    BILITOT 0.3 07/16/2016    ALKPHOS 53 07/16/2016    AST 21 07/16/2016    ALT 30 07/16/2016    LABGLOM 46 (A) 07/18/2016    GLOB 2.6 01/26/2015           Lab Results   Component Value Date    NA 142 07/18/2016    NA 145 07/15/2010    K 4.4 07/18/2016    K 4.1 07/15/2010     CL 106 07/18/2016    CL 111 07/15/2010    CO2 22 07/18/2016    BUN 23 07/18/2016    CREATININE 1.2 07/18/2016    CREATININE 1.3 07/15/2010    GLUCOSE 131 07/18/2016    CALCIUM 9.7 07/18/2016            Lab Results   Component Value Date    CHOL 205 (H) 02/15/2016           Lab Results   Component Value Date    TRIG 243 (H) 02/15/2016           Lab Results   Component Value Date    HDL 39 (L) 02/15/2016           Lab Results   Component Value Date    LDLCALC 117 02/15/2016     No results found for: LABVLDL, VLDL  No results found for: [facility]        Lab Results   Component Value Date    LABA1C 7.0 (H) 02/15/2016     No results found for: EAG        Lab Results   Component Value Date    TSH 1.040 07/18/2016  Lab Results   Component Value Date    VITD25 39.1 07/18/2016       Assessments Administered:    PHQ9: 4, normal  HAM-A: 2, normal    Assessment:    1. Bipolar 1 disorder (HCC)    2. Insomnia due to other mental disorder        Plan:  Continue:  Trazodone, 100mg , take 2 tablets as needed for sleep at night  Zyprexa, 10mg , nightly  Mirtazapine, 15mg , 1/2 tab nightly for sleep  Lamotrigine, 200mg , nightly  Wellbutrin XL, 150mg , daily    Follow up: Return in about 3 months (around 12/19/2017).    1. The risks, benefits, side effects, indications, contraindications, and adverse effects of the medications have been discussed. Yes.  2. The pt has verbalized understanding and has capacity to give informed consent.  3. The Zadie Rhine report has been reviewed according to [facility] regulations.  4. Supportive therapy offered.  5. Follow up:    Return in about 3 months (around 12/19/2017).  6. The patient has been advised to call with any problems.  7. Controlled substance Treatment Plan: none.  8. The above listed medications have been continued, modifications in meds and other orders/labs as follows:                 EncounterMedications        Orders Placed This Encounter   Medications   .  mirtazapine (REMERON) 15 MG tablet     Sig: Take 0.5 tablets by mouth nightly     Dispense:  15 tablet     Refill:  2   . buPROPion (WELLBUTRIN XL) 150 MG extended release tablet     Sig: Take 1 tablet by mouth every morning     Dispense:  30 tablet     Refill:  3                    No orders of the defined types were placed in this encounter.      9. Additional comments: She says her medication is working well and this is reflected in her PHQ9 and HAMA scores. Although she is taking 3 different sleep medications, she does not want to make a change. Will make no medication changes at this visit and will follow up in about 3 months.     10.Over 50% of the total visit time of   30  minutes was spent on counseling and/or coordination of care of:                        1. Bipolar 1 disorder (HCC)    2. Insomnia due to other mental disorder                      Psychotherapy Topics: mood/medication effectiveness       Redge Gainer PMHNP-BC

## 2017-11-11 MED ORDER — LAMOTRIGINE 200 MG PO TABS
200 MG | ORAL_TABLET | Freq: Every evening | ORAL | 2 refills | Status: DC
Start: 2017-11-11 — End: 2018-02-06

## 2017-11-11 MED ORDER — MIRTAZAPINE 15 MG PO TABS
15 MG | ORAL_TABLET | Freq: Every evening | ORAL | 0 refills | Status: AC
Start: 2017-11-11 — End: ?

## 2017-11-12 NOTE — Telephone Encounter (Signed)
 Patient has been notified that RX has ben sent to the pharmacy.

## 2017-12-19 ENCOUNTER — Encounter: Attending: Psychiatric/Mental Health | Primary: Emergency Medicine

## 2018-02-05 NOTE — Telephone Encounter (Signed)
Received request for med refill of Bupropion and Lamictal.  Contacted pt and she stated she waiting to hear from her medical doctor to see if PCP will prescribe these meds where she is living now in [place]--till she gets a [facility] provider once she has insurance in Jan.  Pt will call back if she cannot get the RX filled and will discuss with Deniece PortelaP Vance APRN.

## 2018-02-05 NOTE — Telephone Encounter (Signed)
Received fax for request for med refill of Trazodone from [facility] in [place].  Contacted pt and she reports that she now live in [place].  She reports that she has seen a medical APRN who has prescribed meds for her.  She says she will make an appt with [facility] provider after she has insurance in Jan.  Pt will see if the APRN there will prescribe her med as she as done a 30 day supply in the past.  [facility] now asking for a [facility] day supply.  Pt encouraged to call back if has difficulty getting the RX.

## 2018-02-06 MED ORDER — TRAZODONE HCL 100 MG PO TABS
100 MG | ORAL_TABLET | ORAL | 0 refills | Status: AC
Start: 2018-02-06 — End: ?

## 2018-02-06 MED ORDER — BUPROPION HCL ER (XL) 150 MG PO TB24
150 MG | ORAL_TABLET | Freq: Every morning | ORAL | 0 refills | Status: AC
Start: 2018-02-06 — End: ?

## 2018-02-06 MED ORDER — LAMOTRIGINE 200 MG PO TABS
200 MG | ORAL_TABLET | Freq: Every evening | ORAL | 1 refills | Status: AC
Start: 2018-02-06 — End: ?

## 2018-02-06 NOTE — Telephone Encounter (Signed)
 Joyce Cohen called to request a refill on her medication.      Last office visit : 09/18/2017   Next office visit : Visit date not found   Has recently moved to [facility].  Requested Prescriptions     Pending Prescriptions Disp Refills   . lamoTRIgine  (LAMICTAL ) 200 MG tablet [Pharmacy Med Name: LAMOTRIGINE  200 MG TABLET] 30 tablet 1     Sig: TAKE 1 TABLET BY MOUTH NIGHTLY            Joyce Sida G Joyce Apuzzo, RN        Office Visit     09/18/2017  Joyce [facility]  [facility]   Joyce Cohen   Nurse Practitioner Psychiatric/Mental Health   Bipolar 1 disorder [facility]) +1 more   Dx   Other     Reason for Visit    Progress Notes     Expand All Collapse All    09/18/2017 3:31 PM                               Progress Note    IN:  1500  OUT: 1525      Joyce Cohen Jul 15, 1955                            Chief Complaint   Patient presents with   . Other     mild depressive symptoms         Subjective:  Patient is a 62 y.o. female diagnosed with Bipolar 1 Disorder, Insomnia, PTSD and presents today for follow-up.  Last seen in clinic on 01/31/17 with Joyce Server, Cohen, and prior records were reviewed.    Today patient states, "I am doing ok." She reports that her medications are working fine. She reports that she started taking CBD oil. She reports a little problem with self care and getting up to shower. She reports that it is mild, never bad.    Patient reports side effects as follows: none. No evidence of EPS, no cogwheeling or abnormal motor movements.  Absent  suicidal ideation.  Reports compliance with medications as good .     Current Substance Use:  See history    BP: BP 131/71   Pulse 87   Temp 99.2 F (37.3 C)   Wt 208 lb (94.3 kg)   LMP  (LMP Unknown)   SpO2 95%   BMI 34.61 kg/m       Review of Systems - 14 point review:  Negative except for: T2DM, Mitral Valve Prolapse, Degenerative Disc Disease, Obesity, sciatic nerve pain, environmental allergies    Constitutional: (fevers, chills,  night sweats, wt loss/gain, change in appetite, fatigue, somnolence)    HEENT: (ear pain or discharge, hearing loss, ear ringing, sinus pressure, nosebleed, nasal discharge, sore throat, oral sores, tooth pain, bleeding gums, hoarse voice, neck pain)      Cardiovascular: (HTN, chest pain, elevated cholesterol/lipids, palpitations, leg swelling, leg pain with walking)    Respiratory: (cough, wheezing, snoring, SOB with activity (dyspnea), SOB while lying flat (orthopnea), awakening with severe SOB (paroxysmal nocturnal dyspnea))    Gastrointestinal: (NVD, constipation, abdominal pain, bright red stools, black tarry stools, stool incontinence)     Genitourinary: (pelvic pain, burning or frequency of urination, urinary urgency, blood in urine incomplete bladder emptying, urinary incontinence, STD; MEN: testicular pain or swelling, erectile dysfunction; WOMEN: LMP, heavy menstrual bleeding (  menorrhagia), irregular periods, postmenopausal bleeding, menstrual pain (dymenorrhea, vaginal discharge)    Musculoskeletal: (bone pain/fracture, joint pain or swelling, musle pain)    Integumentary: (rashes, acne, non-healing sores, itching, breast lumps, breast pain, nipple discharge, hair loss)    Neurologic: (HA, muscle weakness, paresthesias (numbness, coldness, crawling or prickling), memory loss, seizure, dizziness)    Psychiatric:  (anxiety, sadness, irritability/anger, insomnia, suicidality)    Endocrine: (heat or cold intolerance, excessive thirst (polydipsia), excessive hunger (polyphagia))    Immune/Allergic: (hives, seasonal or environmental allergies, HIV exposure)    Hematologic/Lymphatic: (lymph node enlargement, easy bleeding or bruising)    History obtained via chart review and patient    Joyce Cohen       Current Meds:    HomeMedications           Prior to Admission medications    Medication Sig Start Date End Date Taking? Authorizing Provider   mirtazapine  (REMERON ) 15 MG tablet  Take 0.5 tablets by mouth nightly 09/18/17  Yes Joyce Cohen   buPROPion  (WELLBUTRIN  XL) 150 MG extended release tablet Take 1 tablet by mouth every morning 09/18/17  Yes Joyce Cohen   OLANZapine  (ZYPREXA ) 10 MG tablet Take 1 tablet by mouth nightly Indications: 1/2 tablet by mouth every night at bedtime 08/13/17   Joyce Cohen   lamoTRIgine  (LAMICTAL ) 200 MG tablet Take 1 tablet by mouth nightly 08/13/17   Joyce Cohen   traZODone  (DESYREL ) 100 MG tablet TAKE 2 TABLETS BY MOUTH NIGHTLY 06/18/17   Joyce Cohen   fenofibrate  micronized (LOFIBRA) 200 MG capsule Take 200 mg by mouth every morning (before breakfast)    Historical Provider, Cohen   insulin  lispro (HUMALOG ) 100 UNIT/ML injection vial Inject 0-6 Units into the skin 3 times daily (with meals) 02/21/16   Joyce Bruch, Cohen   insulin  glargine (LANTUS ) 100 UNIT/ML injection vial Inject 25 Units into the skin daily  Patient taking differently: Inject 40 Units into the skin daily  02/22/16   Joyce McGaffee, Cohen   Cholecalciferol  (VITAMIN D3) 50000 units CAPS Take 50,000 Units by mouth    Historical Provider, Cohen   acetaminophen  (TYLENOL ) 325 MG tablet Take 2 tablets by mouth every 4 hours as needed for Pain 10/11/15   Joyce Dart, DO   albuterol  sulfate HFA (VENTOLIN  HFA) 108 (90 Base) MCG/ACT inhaler Inhale 2 puffs into the lungs every 4 hours as needed for Wheezing 10/11/15   Joyce Dart, DO   famotidine  (PEPCID ) 20 MG tablet Take 1 tablet by mouth 2 times daily 10/11/15   Joyce Dart, DO   Lancets MISC  08/31/15   Historical Provider, Cohen   NONFORMULARY Arthritis hand ointment    Historical Provider, Cohen   montelukast  (SINGULAIR ) 10 MG tablet Take 10 mg by mouth nightly    Historical Provider, Cohen        SocialHistory   Social History           Socioeconomic History   . Marital status: Married     Spouse name: Joyce Cohen   . Number of children: 2   . Years of education:  77   . Highest education level: None   Occupational History   . None   Social Needs   . Financial resource strain: None   . Food insecurity:     Worry: None     Inability: None   . Transportation  needs:     Medical: None     Non-medical: None   Tobacco Use   . Smoking status: Never Smoker   . Smokeless tobacco: Never Used   . Tobacco comment: Disabled due bipolar/Asperger's   Substance and Sexual Activity   . Alcohol use: No   . Drug use: No   . Sexual activity: Not Currently     Partners: Male   Lifestyle   . Physical activity:     Days per week: None     Minutes per session: None   . Stress: None   Relationships   . Social connections:     Talks on phone: None     Gets together: None     Attends religious service: None     Active member of club or organization: None     Attends meetings of clubs or organizations: None     Relationship status: None   . Intimate partner violence:     Fear of current or ex partner: None     Emotionally abused: None     Physically abused: None     Forced sexual activity: None   Other Topics Concern   . None   Social History Narrative   . None          MSE:  Patient is  A & O x 4.  Appearance:  street clothes appropriately dressed for season and age.  Cognition:  Recent memory intact , remote memory intact , good fund of knowledge, average  intelligence level.   Speech:  normal  Language: Naming: intact; Word Finding: intact  Conversation no evidence of delusions  Behavior:  Cooperative and Good eye contact  Mood: euthymic  Affect: congruent with mood  Thought Content: negative delusions, negative hallucinations, negative obsessions,  negativehomicidal and negative suicidal   Thought Process: linear, goal directed and coherent  Associations: logical connections  Attention Span and concentration: Normal   Judgement Insight:  normal and appropriate  Gait and Station:normal gait and station   Sleep: avg 9-10 hrs   Appetite: ok    Cognition: Can spell  "world" backwards: No                    Can do serial 7's: No          Lab Results   Component Value Date    NA 142 07/18/2016    K 4.4 07/18/2016    CL 106 07/18/2016    CO2 22 07/18/2016    BUN 23 07/18/2016    CREATININE 1.2 (H) 07/18/2016    GLUCOSE 131 (H) 07/18/2016    CALCIUM 9.7 07/18/2016    PROT 7.6 07/16/2016    LABALBU 4.7 07/16/2016    BILITOT 0.3 07/16/2016    ALKPHOS 53 07/16/2016    AST 21 07/16/2016    ALT 30 07/16/2016    LABGLOM 46 (A) 07/18/2016    GLOB 2.6 01/26/2015           Lab Results   Component Value Date    NA 142 07/18/2016    NA 145 07/15/2010    K 4.4 07/18/2016    K 4.1 07/15/2010    CL 106 07/18/2016    CL 111 07/15/2010    CO2 22 07/18/2016    BUN 23 07/18/2016    CREATININE 1.2 07/18/2016    CREATININE 1.3 07/15/2010    GLUCOSE 131 07/18/2016    CALCIUM 9.7 07/18/2016  Lab Results   Component Value Date    CHOL 205 (H) 02/15/2016           Lab Results   Component Value Date    TRIG 243 (H) 02/15/2016           Lab Results   Component Value Date    HDL 39 (L) 02/15/2016           Lab Results   Component Value Date    LDLCALC 117 02/15/2016     No results found for: LABVLDL, VLDL  No results found for: CHOLHDLRATIO        Lab Results   Component Value Date    LABA1C 7.0 (H) 02/15/2016     No results found for: EAG        Lab Results   Component Value Date    TSH 1.040 07/18/2016           Lab Results   Component Value Date    VITD25 39.1 07/18/2016       Assessments Administered:    PHQ9: 4, normal  HAM-A: 2, normal    Assessment:    1. Bipolar 1 disorder (HCC)    2. Insomnia due to other mental disorder        Plan:  Continue:  Trazodone , 100mg , take 2 tablets as needed for sleep at night  Zyprexa , 10mg , nightly  Mirtazapine , 15mg , 1/2 tab nightly for sleep  Lamotrigine , 200mg , nightly  Wellbutrin  XL, 150mg , daily    Follow up: Return in about 3 months (around 12/19/2017).    1. The risks, benefits, side effects,  indications, contraindications, and adverse effects of the medications have been discussed. Yes.  2. The pt has verbalized understanding and has capacity to give informed consent.  3. The Reid Capuchin report has been reviewed according to [facility] regulations.  4. Supportive therapy offered.  5. Follow up:    Return in about 3 months (around 12/19/2017).  6. The patient has been advised to call with any problems.  7. Controlled substance Treatment Plan: none.  8. The above listed medications have been continued, modifications in meds and other orders/labs as follows:                 EncounterMedications        Orders Placed This Encounter   Medications   . mirtazapine  (REMERON ) 15 MG tablet     Sig: Take 0.5 tablets by mouth nightly     Dispense:  15 tablet     Refill:  2   . buPROPion  (WELLBUTRIN  XL) 150 MG extended release tablet     Sig: Take 1 tablet by mouth every morning     Dispense:  30 tablet     Refill:  3                    No orders of the defined types were placed in this encounter.      9. Additional comments: She says her medication is working well and this is reflected in her PHQ9 and HAMA scores. Although she is taking 3 different sleep medications, she does not want to make a change. Will make no medication changes at this visit and will follow up in about 3 months.     10.Over 50% of the total visit time of   30  minutes was spent on counseling and/or coordination of care of:  1. Bipolar 1 disorder (HCC)    2. Insomnia due to other mental disorder                      Psychotherapy Topics: mood/medication effectiveness       Joyce Cohen PMHNP-BC

## 2018-02-06 NOTE — Telephone Encounter (Signed)
Attempted to contact the pt--spoke with her husband who states the pharmacy there in [place] was sending in med refill request to her PCP there.  Pt will call if unable to obtain these RXs.

## 2018-02-06 NOTE — Telephone Encounter (Signed)
Pt made aware that since she had not heard from her PCP yet regarding med refills that P Joyce MillionVance APRN has sent in refills for the Lamotrigine, Wellbutrin and Trazodone to [facility] [place] [place].  Pt says she is no longer taking Remeron.  Pt was thankful.

## 2018-02-06 NOTE — Telephone Encounter (Signed)
 Joyce Cohen A Kritikos called to request a refill on her medication.      Last office visit : 09/18/2017   Next office visit : Visit date not found     Requested Prescriptions     Pending Prescriptions Disp Refills   . traZODone  (DESYREL ) 100 MG tablet 60 tablet 0     Sig: TAKE 2 TABLETS BY MOUTH EVERY NIGHT   . buPROPion  (WELLBUTRIN  XL) 150 MG extended release tablet 30 tablet 0     Sig: Take 1 tablet by mouth every morning            Sneha Willig G Dyami Umbach, RN      Office Visit     09/18/2017  [facility]   Cydne Doyne, APRN - NP   Nurse Practitioner Psychiatric/Mental Health   Bipolar 1 disorder [facility]) +1 more   Dx   Other     Reason for Visit    Progress Notes     Expand All Collapse All    09/18/2017 3:31 PM                               Progress Note    IN:  1500  OUT: 1525      Joyce Cohen [birth year]                            Chief Complaint   Patient presents with   . Other     mild depressive symptoms         Subjective:  Patient is a 62 y.o. female diagnosed with Bipolar 1 Disorder, Insomnia, PTSD and presents today for follow-up.  Last seen in clinic on 01/31/17 with Marcey Server, APRN, and prior records were reviewed.    Today patient states, "I am doing ok." She reports that her medications are working fine. She reports that she started taking CBD oil. She reports a little problem with self care and getting up to shower. She reports that it is mild, never bad.    Patient reports side effects as follows: none. No evidence of EPS, no cogwheeling or abnormal motor movements.  Absent  suicidal ideation.  Reports compliance with medications as good .     Current Substance Use:  See history    BP: BP 131/71   Pulse 87   Temp 99.2 F (37.3 C)   Wt 208 lb (94.3 kg)   LMP  (LMP Unknown)   SpO2 95%   BMI 34.61 kg/m       Review of Systems - 14 point review:  Negative except for: T2DM, Mitral Valve Prolapse, Degenerative Disc Disease, Obesity, sciatic nerve pain, environmental  allergies    Constitutional: (fevers, chills, night sweats, wt loss/gain, change in appetite, fatigue, somnolence)    HEENT: (ear pain or discharge, hearing loss, ear ringing, sinus pressure, nosebleed, nasal discharge, sore throat, oral sores, tooth pain, bleeding gums, hoarse voice, neck pain)      Cardiovascular: (HTN, chest pain, elevated cholesterol/lipids, palpitations, leg swelling, leg pain with walking)    Respiratory: (cough, wheezing, snoring, SOB with activity (dyspnea), SOB while lying flat (orthopnea), awakening with severe SOB (paroxysmal nocturnal dyspnea))    Gastrointestinal: (NVD, constipation, abdominal pain, bright red stools, black tarry stools, stool incontinence)     Genitourinary: (pelvic pain, burning or frequency of urination, urinary urgency, blood in urine incomplete bladder emptying, urinary  incontinence, STD; MEN: testicular pain or swelling, erectile dysfunction; WOMEN: LMP, heavy menstrual bleeding (menorrhagia), irregular periods, postmenopausal bleeding, menstrual pain (dymenorrhea, vaginal discharge)    Musculoskeletal: (bone pain/fracture, joint pain or swelling, musle pain)    Integumentary: (rashes, acne, non-healing sores, itching, breast lumps, breast pain, nipple discharge, hair loss)    Neurologic: (HA, muscle weakness, paresthesias (numbness, coldness, crawling or prickling), memory loss, seizure, dizziness)    Psychiatric:  (anxiety, sadness, irritability/anger, insomnia, suicidality)    Endocrine: (heat or cold intolerance, excessive thirst (polydipsia), excessive hunger (polyphagia))    Immune/Allergic: (hives, seasonal or environmental allergies, HIV exposure)    Hematologic/Lymphatic: (lymph node enlargement, easy bleeding or bruising)    History obtained via chart review and patient    PCP is Aliene Ip, MD       Current Meds:    HomeMedications           Prior to Admission medications    Medication Sig Start Date End Date Taking? Authorizing  Provider   mirtazapine  (REMERON ) 15 MG tablet Take 0.5 tablets by mouth nightly 09/18/17  Yes Cydne Doyne, APRN - NP   buPROPion  (WELLBUTRIN  XL) 150 MG extended release tablet Take 1 tablet by mouth every morning 09/18/17  Yes Cydne Doyne, APRN - NP   OLANZapine  (ZYPREXA ) 10 MG tablet Take 1 tablet by mouth nightly Indications: 1/2 tablet by mouth every night at bedtime 08/13/17   Cydne Doyne, APRN - NP   lamoTRIgine  (LAMICTAL ) 200 MG tablet Take 1 tablet by mouth nightly 08/13/17   Cydne Doyne, APRN - NP   traZODone  (DESYREL ) 100 MG tablet TAKE 2 TABLETS BY MOUTH NIGHTLY 06/18/17   Joellen Muscat, APRN   fenofibrate  micronized (LOFIBRA) 200 MG capsule Take 200 mg by mouth every morning (before breakfast)    Historical Provider, MD   insulin  lispro (HUMALOG ) 100 UNIT/ML injection vial Inject 0-6 Units into the skin 3 times daily (with meals) 02/21/16   Bettina Bruch, MD   insulin  glargine (LANTUS ) 100 UNIT/ML injection vial Inject 25 Units into the skin daily  Patient taking differently: Inject 40 Units into the skin daily  02/22/16   Dana McGaffee, MD   Cholecalciferol  (VITAMIN D3) 50000 units CAPS Take 50,000 Units by mouth    Historical Provider, MD   acetaminophen  (TYLENOL ) 325 MG tablet Take 2 tablets by mouth every 4 hours as needed for Pain 10/11/15   Clydia Dart, DO   albuterol  sulfate HFA (VENTOLIN  HFA) 108 (90 Base) MCG/ACT inhaler Inhale 2 puffs into the lungs every 4 hours as needed for Wheezing 10/11/15   Clydia Dart, DO   famotidine  (PEPCID ) 20 MG tablet Take 1 tablet by mouth 2 times daily 10/11/15   Clydia Dart, DO   Lancets MISC  08/31/15   Historical Provider, MD   NONFORMULARY Arthritis hand ointment    Historical Provider, MD   montelukast  (SINGULAIR ) 10 MG tablet Take 10 mg by mouth nightly    Historical Provider, MD        SocialHistory   Social History           Socioeconomic History   . Marital status: Married     Spouse name: Johnnie   .  Number of children: 2   . Years of education: 98   . Highest education level: None   Occupational History   . None   Social Needs   . Financial resource strain: None   . Food insecurity:  Worry: None     Inability: None   . Transportation needs:     Medical: None     Non-medical: None   Tobacco Use   . Smoking status: Never Smoker   . Smokeless tobacco: Never Used   . Tobacco comment: Disabled due bipolar/Asperger's   Substance and Sexual Activity   . Alcohol use: No   . Drug use: No   . Sexual activity: Not Currently     Partners: Male   Lifestyle   . Physical activity:     Days per week: None     Minutes per session: None   . Stress: None   Relationships   . Social connections:     Talks on phone: None     Gets together: None     Attends religious service: None     Active member of club or organization: None     Attends meetings of clubs or organizations: None     Relationship status: None   . Intimate partner violence:     Fear of current or ex partner: None     Emotionally abused: None     Physically abused: None     Forced sexual activity: None   Other Topics Concern   . None   Social History Narrative   . None          MSE:  Patient is  A & O x 4.  Appearance:  street clothes appropriately dressed for season and age.  Cognition:  Recent memory intact , remote memory intact , good fund of knowledge, average  intelligence level.   Speech:  normal  Language: Naming: intact; Word Finding: intact  Conversation no evidence of delusions  Behavior:  Cooperative and Good eye contact  Mood: euthymic  Affect: congruent with mood  Thought Content: negative delusions, negative hallucinations, negative obsessions,  negativehomicidal and negative suicidal   Thought Process: linear, goal directed and coherent  Associations: logical connections  Attention Span and concentration: Normal   Judgement Insight:  normal and appropriate  Gait and Station:normal gait and station   Sleep: avg 9-10  hrs   Appetite: ok    Cognition: Can spell "world" backwards: No                    Can do serial 7's: No          Lab Results   Component Value Date    NA 142 07/18/2016    K 4.4 07/18/2016    CL 106 07/18/2016    CO2 22 07/18/2016    BUN 23 07/18/2016    CREATININE 1.2 (H) 07/18/2016    GLUCOSE 131 (H) 07/18/2016    CALCIUM 9.7 07/18/2016    PROT 7.6 07/16/2016    LABALBU 4.7 07/16/2016    BILITOT 0.3 07/16/2016    ALKPHOS 53 07/16/2016    AST 21 07/16/2016    ALT 30 07/16/2016    LABGLOM 46 (A) 07/18/2016    GLOB 2.6 01/26/2015           Lab Results   Component Value Date    NA 142 07/18/2016    NA 145 07/15/2010    K 4.4 07/18/2016    K 4.1 07/15/2010    CL 106 07/18/2016    CL 111 07/15/2010    CO2 22 07/18/2016    BUN 23 07/18/2016    CREATININE 1.2 07/18/2016    CREATININE 1.3 07/15/2010    GLUCOSE 131  07/18/2016    CALCIUM 9.7 07/18/2016            Lab Results   Component Value Date    CHOL 205 (H) 02/15/2016           Lab Results   Component Value Date    TRIG 243 (H) 02/15/2016           Lab Results   Component Value Date    HDL 39 (L) 02/15/2016           Lab Results   Component Value Date    LDLCALC 117 02/15/2016     No results found for: LABVLDL, VLDL  No results found for: CHOLHDLRATIO        Lab Results   Component Value Date    LABA1C 7.0 (H) 02/15/2016     No results found for: EAG        Lab Results   Component Value Date    TSH 1.040 07/18/2016           Lab Results   Component Value Date    VITD25 39.1 07/18/2016       Assessments Administered:    PHQ9: 4, normal  HAM-A: 2, normal    Assessment:    1. Bipolar 1 disorder (HCC)    2. Insomnia due to other mental disorder        Plan:  Continue:  Trazodone , 100mg , take 2 tablets as needed for sleep at night  Zyprexa , 10mg , nightly  Mirtazapine , 15mg , 1/2 tab nightly for sleep  Lamotrigine , 200mg , nightly  Wellbutrin  XL, 150mg , daily    Follow up: Return in about 3 months (around 12/19/2017).    1.  The risks, benefits, side effects, indications, contraindications, and adverse effects of the medications have been discussed. Yes.  2. The pt has verbalized understanding and has capacity to give informed consent.  3. The Reid Capuchin report has been reviewed according to [facility] regulations.  4. Supportive therapy offered.  5. Follow up:    Return in about 3 months (around 12/19/2017).  6. The patient has been advised to call with any problems.  7. Controlled substance Treatment Plan: none.  8. The above listed medications have been continued, modifications in meds and other orders/labs as follows:                 EncounterMedications        Orders Placed This Encounter   Medications   . mirtazapine  (REMERON ) 15 MG tablet     Sig: Take 0.5 tablets by mouth nightly     Dispense:  15 tablet     Refill:  2   . buPROPion  (WELLBUTRIN  XL) 150 MG extended release tablet     Sig: Take 1 tablet by mouth every morning     Dispense:  30 tablet     Refill:  3                    No orders of the defined types were placed in this encounter.      9. Additional comments: She says her medication is working well and this is reflected in her PHQ9 and HAMA scores. Although she is taking 3 different sleep medications, she does not want to make a change. Will make no medication changes at this visit and will follow up in about 3 months.     10.Over 50% of the total visit time of   30  minutes was spent on  counseling and/or coordination of care of:                        1. Bipolar 1 disorder (HCC)    2. Insomnia due to other mental disorder                      Psychotherapy Topics: mood/medication effectiveness       Cydne Doyne PMHNP-BC

## 2018-05-15 DIAGNOSIS — Z79899 Other long term (current) drug therapy: Secondary | ICD-10-CM | POA: Insufficient documentation

## 2018-10-16 DIAGNOSIS — N183 Chronic kidney disease, stage 3 unspecified: Secondary | ICD-10-CM | POA: Insufficient documentation

## 2019-03-13 ENCOUNTER — Emergency Department
Admission: EM | Admit: 2019-03-13 | Discharge: 2019-03-13 | Disposition: A | Discharge status: Other Acute Inpatient Hospital | Payer: Self-pay | Attending: Emergency Medicine | Admitting: Emergency Medicine

## 2019-03-13 ENCOUNTER — Encounter: Payer: Self-pay | Admitting: Emergency Medicine

## 2019-03-13 ENCOUNTER — Other Ambulatory Visit: Payer: Self-pay

## 2019-03-13 DIAGNOSIS — F315 Bipolar disorder, current episode depressed, severe, with psychotic features: Secondary | ICD-10-CM | POA: Insufficient documentation

## 2019-03-13 DIAGNOSIS — F319 Bipolar disorder, unspecified: Secondary | ICD-10-CM

## 2019-03-13 DIAGNOSIS — R45851 Suicidal ideations: Secondary | ICD-10-CM | POA: Insufficient documentation

## 2019-03-13 DIAGNOSIS — Z20822 Contact with and (suspected) exposure to covid-19: Secondary | ICD-10-CM | POA: Insufficient documentation

## 2019-03-13 DIAGNOSIS — E119 Type 2 diabetes mellitus without complications: Secondary | ICD-10-CM | POA: Insufficient documentation

## 2019-03-13 HISTORY — DX: Type 2 diabetes mellitus without complications: E11.9

## 2019-03-13 HISTORY — DX: Bipolar disorder, unspecified: F31.9

## 2019-03-13 HISTORY — DX: Sciatica, unspecified side: M54.30

## 2019-03-13 LAB — URINE DRUG SCREEN, QUALITATIVE (ARMC ONLY)
Amphetamines, Ur Screen: NOT DETECTED
Barbiturates, Ur Screen: NOT DETECTED
Benzodiazepine, Ur Scrn: NOT DETECTED
Cannabinoid 50 Ng, Ur ~~LOC~~: NOT DETECTED
Cocaine Metabolite,Ur ~~LOC~~: NOT DETECTED
MDMA (Ecstasy)Ur Screen: NOT DETECTED
Methadone Scn, Ur: NOT DETECTED
Opiate, Ur Screen: NOT DETECTED
Phencyclidine (PCP) Ur S: NOT DETECTED
Tricyclic, Ur Screen: NOT DETECTED

## 2019-03-13 LAB — COMPREHENSIVE METABOLIC PANEL
ALT: 34 U/L (ref 0–44)
AST: 19 U/L (ref 15–41)
Albumin: 4.2 g/dL (ref 3.5–5.0)
Alkaline Phosphatase: 62 U/L (ref 38–126)
Anion gap: 7 (ref 5–15)
BUN: 13 mg/dL (ref 8–23)
CO2: 24 mmol/L (ref 22–32)
Calcium: 9.5 mg/dL (ref 8.9–10.3)
Chloride: 107 mmol/L (ref 98–111)
Creatinine, Ser: 1.18 mg/dL — ABNORMAL HIGH (ref 0.44–1.00)
GFR calc Af Amer: 57 mL/min — ABNORMAL LOW (ref >60)
GFR calc non Af Amer: 49 mL/min — ABNORMAL LOW (ref >60)
Glucose, Bld: 169 mg/dL — ABNORMAL HIGH (ref 70–99)
Potassium: 3.8 mmol/L (ref 3.5–5.1)
Sodium: 138 mmol/L (ref 135–145)
Total Bilirubin: 0.6 mg/dL (ref 0.3–1.2)
Total Protein: 7 g/dL (ref 6.5–8.1)

## 2019-03-13 LAB — RESPIRATORY PANEL BY RT PCR (FLU A&B, COVID)
Influenza A by PCR: NEGATIVE
Influenza B by PCR: NEGATIVE
SARS Coronavirus 2 by RT PCR: NEGATIVE

## 2019-03-13 LAB — TSH: TSH: 1.178 u[IU]/mL (ref 0.350–4.500)

## 2019-03-13 LAB — CBC
HCT: 45.1 % (ref 36.0–46.0)
Hemoglobin: 15 g/dL (ref 12.0–15.0)
MCH: 28.4 pg (ref 26.0–34.0)
MCHC: 33.3 g/dL (ref 30.0–36.0)
MCV: 85.3 fL (ref 80.0–100.0)
Platelets: 232 10*3/uL (ref 150–400)
RBC: 5.29 MIL/uL — ABNORMAL HIGH (ref 3.87–5.11)
RDW: 12.3 % (ref 11.5–15.5)
WBC: 10.2 10*3/uL (ref 4.0–10.5)
nRBC: 0 % (ref 0.0–0.2)

## 2019-03-13 LAB — ACETAMINOPHEN LEVEL: Acetaminophen (Tylenol), Serum: <10 ug/mL — ABNORMAL LOW (ref 10–30)

## 2019-03-13 LAB — ETHANOL: Alcohol, Ethyl (B): <10 mg/dL (ref <10)

## 2019-03-13 LAB — T4, FREE: Free T4: 0.85 ng/dL (ref 0.61–1.12)

## 2019-03-13 LAB — SALICYLATE LEVEL: Salicylate Lvl: <7 mg/dL — ABNORMAL LOW (ref 7.0–30.0)

## 2019-03-13 MED ORDER — DIAZEPAM 5 MG PO TABS
10.0000 mg | ORAL_TABLET | Freq: Once | ORAL | Status: AC
  Administered 2019-03-13: 10 mg via ORAL
  Filled 2019-03-13: qty 2

## 2019-03-13 NOTE — BH Assessment (Signed)
Pt has been accepted to [facility] and can arrive at any time. This information was provided to pt's nurse, Matt RN, at 2321.  Room: 303 Accepting: Rashaun Davis, NP Attending: Dr. John Clapacs Call to Report: [phone] 

## 2019-03-13 NOTE — ED Notes (Signed)
Pt brought into ED BHU via sally port and wand with metal detector for safety by ODS officer. Patient oriented to unit/care area: Pt informed of unit policies and procedures.  Informed that, for their safety, care areas are designed for safety and monitored by security cameras at all times; and visiting hours explained to patient. Patient verbalizes understanding, and verbal contract for safety obtained.Pt shown to their room.  

## 2019-03-13 NOTE — ED Triage Notes (Signed)
Pt here for acute manic episode. Admits to SI yesterday and currently reports thoughts but does not want to act on them.  Alert and oriented. NAD.  VSS   Dressed out by [employer] NT and this RN  1 shirt 1 pair pants 1 grey blanket 1 pair shoes 1 pair underwear 1 bra 1 black mask

## 2019-03-13 NOTE — ED Notes (Signed)
Pt has been accepted to [facility] BMU and can arrive at any time. This information was provided to pt's nurse, Susy Frizzle RN, at [phone].  Room: 303 Accepting: Pedro Earls, NP Attending: Dr. Mordecai Rasmussen Call to Report: [phone]

## 2019-03-13 NOTE — ED Notes (Signed)
Pt. Moved to interview room to talk to TTS and NP from [facility] via video feed.
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
## 2019-03-13 NOTE — ED Provider Notes (Signed)
[facility] Emergency Department Provider Note       Time seen: ----------------------------------------- 5:27 PM on 03/13/2019 -----------------------------------------   I have reviewed the triage vital signs and the nursing notes. HISTORY   Chief Complaint Psychiatric Evaluation   HPI Marilyn Davis is a 64 y.o. female with a history of bipolar disorder, diabetes, sciatica who presents to the ED for acute mania.  Patient feels like she is probably having at least a hypomanic episode, has been diagnosed with bipolar disorder.  She is having some suicidal thoughts but not currently wanting to act on them.  She reports increased stress due to financial reasons.  She denies fevers, chills or other complaints.  Past Medical History:  Diagnosis Date  . Bipolar disorder (HCC)   . Diabetes mellitus without complication (HCC)   . Sciatica     There are no problems to display for this patient.   Past Surgical History:  Procedure Laterality Date  . ABDOMINAL HYSTERECTOMY    . EXPLORATORY LAPAROTOMY    . SHOULDER SURGERY    . TONSILLECTOMY      Allergies Sudafed [pseudoephedrine hcl]  Social History Social History   Tobacco Use  . Smoking status: Never Smoker  . Smokeless tobacco: Never Used  Substance Use Topics  . Alcohol use: Never  . Drug use: Never    Review of Systems Constitutional: Negative for fever. Cardiovascular: Negative for chest pain. Respiratory: Negative for shortness of breath. Gastrointestinal: Negative for abdominal pain, vomiting and diarrhea. Musculoskeletal: Negative for back pain. Skin: Negative for rash. Neurological: Negative for headaches, focal weakness or numbness. Psychiatric: Positive for elevated mood, suicidal thoughts  All systems negative/normal/unremarkable except as stated in the HPI  ____________________________________________   PHYSICAL EXAM:  VITAL SIGNS: ED Triage Vitals  Enc Vitals  Group     BP 03/13/19 1653 (!) 133/93     Pulse Rate 03/13/19 1653 100     Resp 03/13/19 1653 (!) 24     Temp 03/13/19 1653 98 F (36.7 C)     Temp Source 03/13/19 1653 Oral     SpO2 03/13/19 1653 97 %     Weight 03/13/19 1654 210 lb (95.3 kg)     Height 03/13/19 1654 5\' 5"  (1.651 m)     Head Circumference --      Peak Flow --      Pain Score 03/13/19 1654 0     Pain Loc --      Pain Edu? --      Excl. in GC? --     Constitutional: Alert and oriented.  Mildly agitated, no distress Eyes: Conjunctivae are normal. Normal extraocular movements. ENT      Head: Normocephalic and atraumatic.      Nose: No congestion/rhinnorhea.      Mouth/Throat: Mucous membranes are moist.      Neck: No stridor. Cardiovascular: Normal rate, regular rhythm. No murmurs, rubs, or gallops. Respiratory: Normal respiratory effort without tachypnea nor retractions. Breath sounds are clear and equal bilaterally. No wheezes/rales/rhonchi. Gastrointestinal: Soft and nontender. Normal bowel sounds Musculoskeletal: Nontender with normal range of motion in extremities. No lower extremity tenderness nor edema. Neurologic:  Normal speech and language. No gross focal neurologic deficits are appreciated.  Skin:  Skin is warm, dry and intact. No rash noted. Psychiatric: Depressed mood and affect, mildly agitated ____________________________________________  ED COURSE:  As part of my medical decision making, I reviewed the following data within the electronic MEDICAL RECORD NUMBER History obtained from  family if available, nursing notes, old chart and ekg, as well as notes from prior ED visits. Patient presented for hypomania and suicidal thoughts, we will assess with labs and imaging as indicated at this time.   Procedures  Marilyn Davis was evaluated in Emergency Department on 03/13/2019 for the symptoms described in the history of present illness. She was evaluated in the context of the global COVID-19 pandemic,  which necessitated consideration that the patient might be at risk for infection with the SARS-CoV-2 virus that causes COVID-19. Institutional protocols and algorithms that pertain to the evaluation of patients at risk for COVID-19 are in a state of rapid change based on information released by regulatory bodies including the CDC and federal and state organizations. These policies and algorithms were followed during the patient's care in the ED.  ____________________________________________   LABS (pertinent positives/negatives)  Labs Reviewed  COMPREHENSIVE METABOLIC PANEL - Abnormal; Notable for the following components:      Result Value   Glucose, Bld 169 (*)    Creatinine, Ser 1.18 (*)    GFR calc non Af Amer 49 (*)    GFR calc Af Amer 57 (*)    All other components within normal limits  SALICYLATE LEVEL - Abnormal; Notable for the following components:   Salicylate Lvl <6.3 (*)    All other components within normal limits  ACETAMINOPHEN LEVEL - Abnormal; Notable for the following components:   Acetaminophen (Tylenol), Serum <10 (*)    All other components within normal limits  CBC - Abnormal; Notable for the following components:   RBC 5.29 (*)    All other components within normal limits  ETHANOL  URINE DRUG SCREEN, QUALITATIVE ([facility] ONLY)  TSH  T4, FREE   ____________________________________________   DIFFERENTIAL DIAGNOSIS   Bipolar disorder, suicidal ideation, depression, anxiety  FINAL ASSESSMENT AND PLAN  Bipolar disorder, suicidal ideation   Plan: The patient had presented for suicidal thoughts with a history of bipolar disorder.  Patient seems somewhat anxious and agitated on arrival and was given oral Valium.  Patient's labs are unremarkable, she appears medically clear for psychiatric evaluation and disposition.Laurence Aly, MD    Note: This note was generated in part or whole with voice recognition software. Voice recognition is usually quite  accurate but there are transcription errors that can and very often do occur. I apologize for any typographical errors that were not detected and corrected.     Earleen Newport, MD 03/13/19 [phone]

## 2019-03-13 NOTE — ED Notes (Signed)
Pt. Currently in [facility] bed awaiting to talk to psychiatric team.  Pt. Calm and cooperative at this time.

## 2019-03-14 ENCOUNTER — Inpatient Hospital Stay
Admission: RE | Admit: 2019-03-14 | Discharge: 2019-03-16 | DRG: 885 | Disposition: A | Discharge status: Home / Self Care | Payer: No Typology Code available for payment source | Source: Intra-hospital | Attending: Psychiatry | Admitting: Psychiatry

## 2019-03-14 ENCOUNTER — Encounter: Payer: Self-pay | Admitting: Psychiatric/Mental Health

## 2019-03-14 ENCOUNTER — Other Ambulatory Visit: Payer: Self-pay

## 2019-03-14 DIAGNOSIS — F314 Bipolar disorder, current episode depressed, severe, without psychotic features: Principal | ICD-10-CM | POA: Diagnosis present

## 2019-03-14 DIAGNOSIS — R45851 Suicidal ideations: Secondary | ICD-10-CM | POA: Diagnosis present

## 2019-03-14 DIAGNOSIS — G4700 Insomnia, unspecified: Secondary | ICD-10-CM | POA: Diagnosis present

## 2019-03-14 DIAGNOSIS — Z794 Long term (current) use of insulin: Secondary | ICD-10-CM

## 2019-03-14 DIAGNOSIS — K219 Gastro-esophageal reflux disease without esophagitis: Secondary | ICD-10-CM | POA: Diagnosis present

## 2019-03-14 DIAGNOSIS — Z915 Personal history of self-harm: Secondary | ICD-10-CM | POA: Diagnosis not present

## 2019-03-14 DIAGNOSIS — K589 Irritable bowel syndrome without diarrhea: Secondary | ICD-10-CM | POA: Diagnosis present

## 2019-03-14 DIAGNOSIS — G894 Chronic pain syndrome: Secondary | ICD-10-CM | POA: Diagnosis present

## 2019-03-14 DIAGNOSIS — E119 Type 2 diabetes mellitus without complications: Secondary | ICD-10-CM | POA: Diagnosis present

## 2019-03-14 DIAGNOSIS — E785 Hyperlipidemia, unspecified: Secondary | ICD-10-CM | POA: Diagnosis present

## 2019-03-14 LAB — HEMOGLOBIN A1C
Hgb A1c MFr Bld: 6.2 % — ABNORMAL HIGH (ref 4.8–5.6)
Mean Plasma Glucose: 131.24 mg/dL

## 2019-03-14 LAB — GLUCOSE, CAPILLARY
Glucose-Capillary: 138 mg/dL — ABNORMAL HIGH (ref 70–99)
Glucose-Capillary: 123 mg/dL — ABNORMAL HIGH (ref 70–99)
Glucose-Capillary: 136 mg/dL — ABNORMAL HIGH (ref 70–99)

## 2019-03-14 MED ORDER — OLANZAPINE 5 MG PO TABS
5.0000 mg | ORAL_TABLET | Freq: Every day | ORAL | Status: DC
  Administered 2019-03-14 – 2019-03-15 (×2): 5 mg via ORAL
  Filled 2019-03-14 (×2): qty 1

## 2019-03-14 MED ORDER — INSULIN ASPART 100 UNIT/ML ~~LOC~~ SOLN
4.0000 [IU] | Freq: Three times a day (TID) | SUBCUTANEOUS | Status: DC
  Administered 2019-03-14 – 2019-03-16 (×7): 4 [IU] via SUBCUTANEOUS
  Filled 2019-03-14 (×4): qty 1

## 2019-03-14 MED ORDER — FAMOTIDINE 20 MG PO TABS
20.0000 mg | ORAL_TABLET | Freq: Two times a day (BID) | ORAL | Status: DC
  Administered 2019-03-14 – 2019-03-16 (×5): 20 mg via ORAL
  Filled 2019-03-14 (×5): qty 1

## 2019-03-14 MED ORDER — TRAMADOL HCL 50 MG PO TABS: 50.0000 mg | ORAL_TABLET | Freq: Four times a day (QID) | ORAL | Status: DC | PRN

## 2019-03-14 MED ORDER — TRAZODONE HCL 50 MG PO TABS: 50.0000 mg | ORAL_TABLET | Freq: Every evening | ORAL | Status: DC | PRN

## 2019-03-14 MED ORDER — TRAZODONE HCL 50 MG PO TABS
150.0000 mg | ORAL_TABLET | Freq: Every day | ORAL | Status: DC
  Administered 2019-03-14: 150 mg via ORAL
  Filled 2019-03-14: qty 3

## 2019-03-14 MED ORDER — LAMOTRIGINE 100 MG PO TABS
200.0000 mg | ORAL_TABLET | Freq: Every day | ORAL | Status: DC
  Administered 2019-03-14 – 2019-03-15 (×2): 200 mg via ORAL
  Filled 2019-03-14 (×2): qty 2

## 2019-03-14 MED ORDER — ATORVASTATIN CALCIUM 10 MG PO TABS
10.0000 mg | ORAL_TABLET | Freq: Every day | ORAL | Status: DC
  Administered 2019-03-14 – 2019-03-15 (×2): 10 mg via ORAL
  Filled 2019-03-14 (×3): qty 1

## 2019-03-14 MED ORDER — ALUM & MAG HYDROXIDE-SIMETH 200-200-20 MG/5ML PO SUSP: 30.0000 mL | ORAL | Status: DC | PRN

## 2019-03-14 MED ORDER — ERGOCALCIFEROL 200 MCG/ML PO SOLN
1250.0000 [IU] | ORAL | Status: DC
  Filled 2019-03-14: qty 0.16

## 2019-03-14 MED ORDER — INSULIN ASPART 100 UNIT/ML ~~LOC~~ SOLN
0.0000 [IU] | Freq: Three times a day (TID) | SUBCUTANEOUS | Status: DC
  Administered 2019-03-14 (×2): 2 [IU] via SUBCUTANEOUS
  Administered 2019-03-15 – 2019-03-16 (×5): 3 [IU] via SUBCUTANEOUS
  Filled 2019-03-14 (×3): qty 1

## 2019-03-14 MED ORDER — ZOLPIDEM TARTRATE 5 MG PO TABS: 5.0000 mg | ORAL_TABLET | Freq: Every evening | ORAL | Status: DC | PRN

## 2019-03-14 MED ORDER — FENOFIBRATE 160 MG PO TABS
160.0000 mg | ORAL_TABLET | Freq: Every day | ORAL | Status: DC
  Administered 2019-03-14 – 2019-03-16 (×3): 160 mg via ORAL
  Filled 2019-03-14 (×3): qty 1

## 2019-03-14 MED ORDER — PNEUMOCOCCAL VAC POLYVALENT 25 MCG/0.5ML IJ INJ
0.5000 mL | INJECTION | INTRAMUSCULAR | Status: DC
  Filled 2019-03-14: qty 0.5

## 2019-03-14 MED ORDER — MAGNESIUM HYDROXIDE 400 MG/5ML PO SUSP: 30.0000 mL | Freq: Every day | ORAL | Status: DC | PRN

## 2019-03-14 MED ORDER — TRAZODONE HCL 100 MG PO TABS: 100.0000 mg | ORAL_TABLET | Freq: Every day | ORAL | Status: DC

## 2019-03-14 MED ORDER — MONTELUKAST SODIUM 10 MG PO TABS
10.0000 mg | ORAL_TABLET | Freq: Every day | ORAL | Status: DC
  Administered 2019-03-14 – 2019-03-15 (×2): 10 mg via ORAL
  Filled 2019-03-14 (×2): qty 1

## 2019-03-14 MED ORDER — INFLUENZA VAC SPLIT QUAD 0.5 ML IM SUSY: 0.5000 mL | PREFILLED_SYRINGE | INTRAMUSCULAR | Status: DC

## 2019-03-14 MED ORDER — INSULIN GLARGINE 100 UNIT/ML ~~LOC~~ SOLN
36.0000 [IU] | Freq: Every day | SUBCUTANEOUS | Status: DC
  Administered 2019-03-14: 36 [IU] via SUBCUTANEOUS
  Filled 2019-03-14 (×2): qty 0.36

## 2019-03-14 MED ORDER — ACETAMINOPHEN 325 MG PO TABS: 650.0000 mg | ORAL_TABLET | Freq: Four times a day (QID) | ORAL | Status: DC | PRN

## 2019-03-14 MED ORDER — QUETIAPINE FUMARATE 25 MG PO TABS
25.0000 mg | ORAL_TABLET | Freq: Three times a day (TID) | ORAL | Status: DC
  Administered 2019-03-14 – 2019-03-16 (×7): 25 mg via ORAL
  Filled 2019-03-14 (×7): qty 1

## 2019-03-14 MED ORDER — BUPROPION HCL ER (XL) 150 MG PO TB24
150.0000 mg | ORAL_TABLET | Freq: Every day | ORAL | Status: DC
  Administered 2019-03-14 – 2019-03-16 (×3): 150 mg via ORAL
  Filled 2019-03-14 (×3): qty 1

## 2019-03-14 MED ORDER — LUBIPROSTONE 8 MCG PO CAPS
8.0000 ug | ORAL_CAPSULE | Freq: Every day | ORAL | Status: DC
  Administered 2019-03-14 – 2019-03-16 (×3): 8 ug via ORAL
  Filled 2019-03-14 (×3): qty 1

## 2019-03-14 MED ORDER — HYDROXYZINE HCL 25 MG PO TABS: 25.0000 mg | ORAL_TABLET | Freq: Three times a day (TID) | ORAL | Status: DC | PRN

## 2019-03-14 MED ORDER — MIRTAZAPINE 15 MG PO TABS
15.0000 mg | ORAL_TABLET | Freq: Every day | ORAL | Status: DC
  Administered 2019-03-14 – 2019-03-15 (×2): 15 mg via ORAL
  Filled 2019-03-14 (×2): qty 1

## 2019-03-14 NOTE — [facility] Group Notes (Signed)
LCSW Group Therapy Note  03/14/2019 1:15pm  Type of Therapy and Topic:  Group Therapy:  Cognitive Distortions  Participation Level:  Active   Description of Group:    Patients in this group will be introduced to the topic of cognitive distortions.  Patients will identify and describe cognitive distortions, describe the feelings these distortions create for them.  Patients will identify one or more situations in their personal life where they have cognitively distorted thinking and will verbalize challenging this cognitive distortion through positive thinking skills.  Patients will practice the skill of using positive affirmations to challenge cognitive distortions using affirmation cards.    Therapeutic Goals:  1. Patient will identify two or more cognitive distortions they have used 2. Patient will identify one or more emotions that stem from use of a cognitive distortion 3. Patient will demonstrate use of a positive affirmation to counter a cognitive distortion through discussion and/or role play. 4. Patient will describe one way cognitive distortions can be detrimental to wellness   Summary of Patient Progress: The patient scored her mood at a 5 (10 best). Patients were introduced to the topic of cognitive distortions. The patient was able to identify and describe cognitive distortions, described the feelings these distortions create for her.  The patient shared that she uses "personalization" often. Patient identified a situation in her personal life where she has cognitively distorted thinking. The patient verbalized and challenged this cognitive distortion through positive thinking skills. Patient was able to provide support and validation to other group members.      Therapeutic Modalities:   Cognitive Behavioral Therapy Motivational Interviewing   Romeo Zielinski  CUEBAS-COLON, LCSW 03/14/2019 12:51 PM

## 2019-03-14 NOTE — [facility] Suicide Risk Assessment (Addendum)
[facility] INPATIENT:  Family/Significant Other Suicide Prevention Education  Suicide Prevention Education:  Education Completed; Savilla Turbyfill, husband,  (name of family member/significant other) has been identified by the patient as the family member/significant other with whom the patient will be residing, and identified as the person(s) who will aid the patient in the event of a mental health crisis (suicidal ideations/suicide attempt).  With written consent from the patient, the family member/significant other has been provided the following suicide prevention education, prior to the and/or following the discharge of the patient.  The suicide prevention education provided includes the following:  Suicide risk factors  Suicide prevention and interventions  National Suicide Hotline telephone number  [facility] assessment telephone number  [facility] Emergency Assistance 911  [facility] and/or Residential Mobile Crisis Unit telephone number  Request made of family/significant other to:  Remove weapons (e.g., guns, rifles, knives), all items previously/currently identified as safety concern.    Remove drugs/medications (over-the-counter, prescriptions, illicit drugs), all items previously/currently identified as a safety concern.  The family member/significant other verbalizes understanding of the suicide prevention education information provided.  The family member/significant other agrees to remove the items of safety concern listed above.   CSW spoke with husband. Husband reports no barriers to patient obtaining needed medication, after care or transportation. Husband will be the one transporting patient home upon discharge. CSW asked husband if he has any questions, husband declined.  Marilyn Davis, LCSWA 03/14/2019, 3:44 PM
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
## 2019-03-14 NOTE — Progress Notes (Addendum)
Patient presents with sad, flat affect. Denies SI, HI, AVH. Endorses depression. Isolative to self but does spend time in milieu. Minimal interaction with staff and peers. Patient with no complaints. Eating meals well. Patient reports slept well with no sleep aid. Rates depression and hopelessness at 3 and anxiety at 7 Encouragement and support offered. Safety checks maintained. Medications given as prescribed. Pt receptive and remains safe on unit with q 15 min checks.

## 2019-03-14 NOTE — Progress Notes (Signed)
D: Patient admitted for suicidal thoughts without plan and some reported hypomania symptoms observed by her husband. She reports trigger as being under a lot of stress because her husband had to retire from his job as a Naval architect due to heart problems and they are having financial difficulties. She is type 2 diabetic and unable to afford her medication and has been trying to control it with diet. She has a history of cutting but has not cut in several years. Skin assessment done with Delorse Limber, RN and skin is intact, with one tattoo on left shoulder and one on left wrist. Mood is sad and anxious, affect is appropriate to circumstance. Denies current SI but has thoughts of cutting. Contracts for safety. Denies HI or AVH. She reports hypomanic symptoms of increased appetite, racing thoughts. And diminished sleep.  A: Continue to monitor for safety R: Safety maintained.

## 2019-03-14 NOTE — Plan of Care (Signed)
  Problem: Education: Goal: Knowledge of [facility] Education information/materials will improve Outcome: Progressing Goal: Verbalization of understanding the information provided will improve Outcome: Progressing   Problem: Activity: Goal: Sleeping patterns will improve Outcome: Progressing   Problem: Coping: Goal: Ability to verbalize frustrations and anger appropriately will improve Outcome: Progressing Goal: Ability to demonstrate self-control will improve Outcome: Progressing   Problem: Education: Goal: Emotional status will improve Outcome: Not Progressing Goal: Mental status will improve Outcome: Not Progressing   

## 2019-03-14 NOTE — Plan of Care (Signed)
  Problem: Education: Goal: Knowledge of [facility] Education information/materials will improve Outcome: Progressing Goal: Emotional status will improve Outcome: Progressing Goal: Mental status will improve Outcome: Progressing Goal: Verbalization of understanding the information provided will improve Outcome: Progressing  D: Patient admitted for suicidal thoughts without plan and some reported hypomania symptoms observed by her husband. She reports trigger as being under a lot of stress because her husband had to retire from his job as a Naval architect due to heart problems and they are having financial difficulties. She is type 2 diabetic and unable to afford her medication and has been trying to control it with diet. She has a history of cutting but has not cut in several years. Skin assessment done with Delorse Limber, RN and skin is intact, with one tattoo on left shoulder and one on left wrist. Mood is sad and anxious, affect is appropriate to circumstance. Denies current SI but has thoughts of cutting. Contracts for safety. Denies HI or AVH. She reports hypomanic symptoms of increased appetite, racing thoughts. And diminished sleep.  A: Continue to monitor for safety R: Safety maintained.
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
## 2019-03-14 NOTE — Tx Team (Signed)
Initial Treatment Plan 03/14/2019 1:04 AM [name] TIW:[id number]    PATIENT STRESSORS: Financial difficulties   PATIENT STRENGTHS: Ability for insight Average or above average intelligence Capable of independent living Communication skills General fund of knowledge Motivation for treatment/growth   PATIENT IDENTIFIED PROBLEMS:       anxiety  Suicidal ideation  hypomania           DISCHARGE CRITERIA:  Ability to meet basic life and health needs Adequate post-discharge living arrangements Improved stabilization in mood, thinking, and/or behavior Medical problems require only outpatient monitoring Motivation to continue treatment in a less acute level of care Need for constant or close observation no longer present Reduction of life-threatening or endangering symptoms to within safe limits Safe-care adequate arrangements made Verbal commitment to aftercare and medication compliance  PRELIMINARY DISCHARGE PLAN: Outpatient therapy Return to previous living arrangement  PATIENT/FAMILY INVOLVEMENT: This treatment plan has been presented to and reviewed with the patient, Marilyn Davis, and/or family member.  The patient and family have been given the opportunity to ask questions and make suggestions.  Billy Coast, RN 03/14/2019, 1:04 AM

## 2019-03-14 NOTE — H&P (Signed)
Psychiatric Admission Assessment Adult  Patient Identification: Marilyn KungBreandan Davis MRN:  [id number] Date of Evaluation:  03/14/2019 Chief Complaint:  Bipolar 1 disorder, depressed, severe (HCC) [F31.4] Principal Diagnosis: <principal problem not specified> Diagnosis:  Active Problems:   Bipolar 1 disorder, depressed, severe (HCC)  History of Present Illness: Patient is seen and examined.  Patient is a 64 year old female with a past psychiatric history significant for reported bipolar disorder as well as probable borderline personality disorder who presented to the [facility] emergency department on 03/13/2019 with feeling "hypomanic".  The patient stated that she and her husband relocated from [place] last year, and recently moved to the [place] area around June of this year.  She stated that the financial issues had caused problems with her being able to obtain her medicines.  She is on a complex regimen of medicines.  She is seeing a Publishing rights managernurse practitioner in [place] who has continued these medicines that she had been prescribed most recently at a hospitalization in [place], [place].  She stated that her practitioner had told her that if she was unable to afford medicines that the one that she could potentially get rid of first would be Wellbutrin.  She did not have that refilled.  She stated since that point things have gotten worse.  She stated that she had been doing more goal-directed activity, more agitated and hypomanic.  Most recently she had begun to hit herself.  She has a reported history of cutting herself in the past.  She stated that she has had approximately 1 hospitalization every 2 to 3 years.  She stated that most recently her husband had told her that "you need to go to the hospital".  She then presented to the [facility] emergency department.  She was signed in on a voluntary admission.  She also complained of poor frustration tolerance,  increased anxiety, increased irritability and social withdrawal.  She has attempted suicide at least twice in the past.  Again she had admitted to cutting, but none within the last 10 years.  She did admit to hitting her self.  She was admitted to the hospital for evaluation and stabilization.  Associated Signs/Symptoms: Depression Symptoms:  depressed mood, anhedonia, insomnia, psychomotor agitation, fatigue, feelings of worthlessness/guilt, difficulty concentrating, hopelessness, suicidal thoughts without plan, anxiety, loss of energy/fatigue, disturbed sleep, weight loss, (Hypo) Manic Symptoms:  Elevated Mood, Impulsivity, Irritable Mood, Anxiety Symptoms:  Excessive Worry, Psychotic Symptoms:  Denied PTSD Symptoms: Negative Total Time spent with patient: 30 minutes  Past Psychiatric History: Patient has had several psychiatric hospitalizations in her lifetime.  [place], [place] approximately 2 to 3 years ago.  She has been on the same medications since that hospitalization.  She has been diagnosed with bipolar disorder.  Is the patient at risk to self? Yes.    Has the patient been a risk to self in the past 6 months? Yes.    Has the patient been a risk to self within the distant past? Yes.    Is the patient a risk to others? No.  Has the patient been a risk to others in the past 6 months? No.  Has the patient been a risk to others within the distant past? No.   Prior Inpatient Therapy:   Prior Outpatient Therapy:    Alcohol Screening: 1. How often do you have a drink containing alcohol?: Never 2. How many drinks containing alcohol do you have on a typical day when you  are drinking?: 1 or 2 3. How often do you have six or more drinks on one occasion?: Never AUDIT-C Score: 0 4. How often during the last year have you found that you were not able to stop drinking once you had started?: Never 5. How often during the last year have you failed  to do what was normally expected from you becasue of drinking?: Never 6. How often during the last year have you needed a first drink in the morning to get yourself going after a heavy drinking session?: Never 7. How often during the last year have you had a feeling of guilt of remorse after drinking?: Never 8. How often during the last year have you been unable to remember what happened the night before because you had been drinking?: Never 9. Have you or someone else been injured as a result of your drinking?: No 10. Has a relative or friend or a doctor or another health worker been concerned about your drinking or suggested you cut down?: No Alcohol Use Disorder Identification Test Final Score (AUDIT): 0 Alcohol Brief Interventions/Follow-up: Brief Advice Substance Abuse History in the last 12 months:  No. Consequences of Substance Abuse: Negative Previous Psychotropic Medications: Yes  Psychological Evaluations: Yes  Past Medical History:  Past Medical History:  Diagnosis Date  . Bipolar disorder (HCC)   . Diabetes mellitus without complication (HCC)   . Sciatica     Past Surgical History:  Procedure Laterality Date  . ABDOMINAL HYSTERECTOMY    . EXPLORATORY LAPAROTOMY    . SHOULDER SURGERY    . TONSILLECTOMY     Family History: History reviewed. No pertinent family history. Family Psychiatric  History: Patient stated that her mother had borderline personality disorder and that her father suffered from bipolar disorder. Tobacco Screening: Have you used any form of tobacco in the last 30 days? (Cigarettes, Smokeless Tobacco, Cigars, and/or Pipes): No Social History:  Social History   Substance and Sexual Activity  Alcohol Use Never     Social History   Substance and Sexual Activity  Drug Use Never    Additional Social History:                           Allergies:   Allergies  Allergen Reactions  . Sudafed [Pseudoephedrine Hcl] Other (See Comments)     manic   Lab Results:  Results for orders placed or performed during the hospital encounter of 03/14/19 (from the past 48 hour(s))  Glucose, capillary     Status: Abnormal   Collection Time: 03/14/19 11:34 AM  Result Value Ref Range   Glucose-Capillary 138 (H) 70 - 99 mg/dL   Comment 1 Notify RN     Blood Alcohol level:  Lab Results  Component Value Date   ETH <10 03/13/2019    Metabolic Disorder Labs:  No results found for: HGBA1C, MPG No results found for: PROLACTIN No results found for: CHOL, TRIG, HDL, CHOLHDL, VLDL, LDLCALC  Current Medications: Current Facility-Administered Medications  Medication Dose Route Frequency Provider Last Rate Last Admin  . acetaminophen (TYLENOL) tablet 650 mg  650 mg Oral Q6H PRN Dixon, Rashaun M, NP      . alum & mag hydroxide-simeth (MAALOX/MYLANTA) 200-200-20 MG/5ML suspension 30 mL  30 mL Oral Q4H PRN Dixon, Rashaun M, NP      . atorvastatin (LIPITOR) tablet 10 mg  10 mg Oral q1800 Antonieta Pert, MD      .  buPROPion (WELLBUTRIN XL) 24 hr tablet 150 mg  150 mg Oral Daily Antonieta Pert, MD   150 mg at 03/14/19 5409  . ergocalciferol (DRISDOL) 200 MCG/ML drops 1,280 Units  1,280 Units Oral Weekly Antonieta Pert, MD      . famotidine (PEPCID) tablet 20 mg  20 mg Oral BID Antonieta Pert, MD   20 mg at 03/14/19 8119  . fenofibrate tablet 160 mg  160 mg Oral Daily Antonieta Pert, MD   160 mg at 03/14/19 1478  . hydrOXYzine (ATARAX/VISTARIL) tablet 25 mg  25 mg Oral TID PRN Jearld Lesch, NP      . Melene Muller ON 03/15/2019] influenza vac split quadrivalent PF (FLUARIX) injection 0.5 mL  0.5 mL Intramuscular Tomorrow-1000 Clapacs, John T, MD      . insulin aspart (novoLOG) injection 0-15 Units  0-15 Units Subcutaneous TID WC Antonieta Pert, MD   2 Units at 03/14/19 1201  . insulin aspart (novoLOG) injection 4 Units  4 Units Subcutaneous TID WC Antonieta Pert, MD   4 Units at 03/14/19 1201  . insulin glargine (LANTUS)  injection 36 Units  36 Units Subcutaneous QHS Antonieta Pert, MD      . lamoTRIgine (LAMICTAL) tablet 200 mg  200 mg Oral QHS Antonieta Pert, MD      . lubiprostone (AMITIZA) capsule 8 mcg  8 mcg Oral Q breakfast Antonieta Pert, MD   8 mcg at 03/14/19 [id number]  . magnesium hydroxide (MILK OF MAGNESIA) suspension 30 mL  30 mL Oral Daily PRN Dixon, Rashaun M, NP      . mirtazapine (REMERON) tablet 15 mg  15 mg Oral QHS Antonieta Pert, MD      . montelukast (SINGULAIR) tablet 10 mg  10 mg Oral QHS Antonieta Pert, MD      . OLANZapine [facility]) tablet 5 mg  5 mg Oral QHS Antonieta Pert, MD      . Melene Muller ON 03/15/2019] pneumococcal 23 valent vaccine (PNEUMOVAX-23) injection 0.5 mL  0.5 mL Intramuscular Tomorrow-1000 Clapacs, John T, MD      . QUEtiapine (SEROQUEL) tablet 25 mg  25 mg Oral TID Antonieta Pert, MD   25 mg at 03/14/19 1201  . traMADol (ULTRAM) tablet 50 mg  50 mg Oral Q6H PRN Antonieta Pert, MD      . traZODone (DESYREL) tablet 150 mg  150 mg Oral QHS Antonieta Pert, MD      . zolpidem [facility]) tablet 5 mg  5 mg Oral QHS PRN Antonieta Pert, MD       PTA Medications: Medications Prior to Admission  Medication Sig Dispense Refill Last Dose  . atorvastatin (LIPITOR) 10 MG tablet Take 10 mg by mouth at bedtime.     Marland Kitchen buPROPion (WELLBUTRIN XL) 150 MG 24 hr tablet Take 150 mg by mouth daily.     . ergocalciferol (VITAMIN D2) 1.25 MG (50000 UT) capsule Take 50,000 Units by mouth once a week.     . famotidine (PEPCID) 20 MG tablet Take 20 mg by mouth 2 (two) times daily.     . fenofibrate micronized (LOFIBRA) 200 MG capsule Take 200 mg by mouth daily.     . Insulin Glargine (LANTUS SOLOSTAR) 100 UNIT/ML Solostar Pen Inject 32 Units into the skin daily.     . insulin lispro (HUMALOG) 100 UNIT/ML injection Inject 4 Units into the skin 4 (four) times daily -  before meals  and at bedtime. (as needed)     . lamoTRIgine (LAMICTAL) 200 MG tablet Take 200 mg by mouth  at bedtime.     Marland Kitchen lubiprostone (AMITIZA) 8 MCG capsule Take 8 mcg by mouth daily.     . mirtazapine (REMERON) 15 MG tablet Take 15 mg by mouth at bedtime.     . montelukast (SINGULAIR) 10 MG tablet Take 10 mg by mouth daily.     Marland Kitchen OLANZapine (ZYPREXA) 10 MG tablet Take 5 mg by mouth at bedtime.     Marland Kitchen QUEtiapine (SEROQUEL) 25 MG tablet Take 25 mg by mouth 3 (three) times daily.     . traMADol (ULTRAM) 50 MG tablet Take 50-100 mg by mouth every 4 (four) hours as needed for moderate pain or severe pain.      . traZODone (DESYREL) 100 MG tablet Take 100 mg by mouth at bedtime. (take with 150mg  dose to equal 250mg )     . traZODone (DESYREL) 150 MG tablet Take 150 mg by mouth at bedtime. (take with 100mg  dose to equal 250mg )     . zolpidem (AMBIEN) 5 MG tablet Take 5 mg by mouth at bedtime as needed for sleep.       Musculoskeletal: Strength & Muscle Tone: within normal limits Gait & Station: normal Patient leans: N/A  Psychiatric Specialty Exam: Physical Exam  Nursing note and vitals reviewed. Constitutional: She is oriented to person, place, and time. She appears well-developed and well-nourished.  HENT:  Head: Normocephalic and atraumatic.  Respiratory: Effort normal.  Neurological: She is alert and oriented to person, place, and time.    Review of Systems  Blood pressure (!) 135/103, pulse 86, temperature (!) 97.5 F (36.4 C), temperature source Oral, resp. rate 18, height 5\' 5"  (1.651 m), weight 92.1 kg, SpO2 94 %.Body mass index is 33.78 kg/m.  General Appearance: Disheveled  Eye Contact:  Fair  Speech:  Normal Rate  Volume:  Decreased  Mood:  Anxious, Depressed and Dysphoric  Affect:  Congruent  Thought Process:  Coherent and Descriptions of Associations: Intact  Orientation:  Full (Time, Place, and Person)  Thought Content:  Logical  Suicidal Thoughts:  Yes.  without intent/plan  Homicidal Thoughts:  No  Memory:  Immediate;   Fair Recent;   Fair Remote;   Fair   Judgement:  Impaired  Insight:  Fair  Psychomotor Activity:  Psychomotor Retardation  Concentration:  Concentration: Fair and Attention Span: Fair  Recall:  of Knowledge:  Fair  Language:  Good  Akathisia:  Negative  Handed:  Right  AIMS (if indicated):     Assets:  Desire for Improvement Resilience Social Support  ADL's:  Intact  Cognition:  WNL  Sleep:  Number of Hours: 4.25    Treatment Plan Summary: Daily contact with patient to assess and evaluate symptoms and progress in treatment, Medication management and Plan : Patient is seen and examined.  Patient is a 64 year old female with the above-stated past psychiatric history who was admitted secondary to worsening bipolar symptoms.  She will be admitted to the hospital.  She will be integrated into the milieu.  She will be encouraged to attend groups.  Given the complexity of her medications I think at least in the short run we should continue her medications as they are at home, but clearly decreasing her medication burden if possible would probably be a good thing.  She is highly linked to her medications and resistant to any changes at least  at this point.  When asked about being able to afford them after her discharge she stated that her husband has arranged to have their insurance reinstated, next month she should be able to afford all of her medicines.  She will be continued on the Seroquel and Zyprexa despite my concerns about side effects including her diabetic condition which is poorly controlled.  Her Wellbutrin will be restarted, and her Remeron will be continued.  She will also be continued on her Lamictal.  We will reinstate her diabetic medication as she is previously had written by her family practice physician at the [facility].  We will put her on a sliding scale as well.  Her Lipitor and fenofibrate will also be continued.  Her Amitiza will be continued.  Review of her laboratories revealed an  elevated blood sugar at 169, and a mildly elevated creatinine at 1.18.  Otherwise her electrolytes were essentially normal.  Her CBC was essentially normal.  TSH was normal at 1.170.  Her acetaminophen and salicylate were negative.  Drug screen was completely negative.  Her blood pressure is mildly elevated at 135/103.  Her rate is 86.  She is afebrile.  Observation Level/Precautions:  15 minute checks  Laboratory:  Chemistry Profile  Psychotherapy:    Medications:    Consultations:    Discharge Concerns:    Estimated LOS:  Other:     Physician Treatment Plan for Primary Diagnosis: <principal problem not specified> Long Term Goal(s): Improvement in symptoms so as ready for discharge  Short Term Goals: Ability to identify changes in lifestyle to reduce recurrence of condition will improve, Ability to verbalize feelings will improve, Ability to disclose and discuss suicidal ideas, Ability to demonstrate self-control will improve, Ability to identify and develop effective coping behaviors will improve and Ability to maintain clinical measurements within normal limits will improve  Physician Treatment Plan for Secondary Diagnosis: Active Problems:   Bipolar 1 disorder, depressed, severe ([place])  Long Term Goal(s): Improvement in symptoms so as ready for discharge  Short Term Goals: Ability to identify changes in lifestyle to reduce recurrence of condition will improve, Ability to verbalize feelings will improve, Ability to disclose and discuss suicidal ideas, Ability to demonstrate self-control will improve, Ability to identify and develop effective coping behaviors will improve and Ability to maintain clinical measurements within normal limits will improve  I certify that inpatient services furnished can reasonably be expected to improve the patient's condition.    Sharma Covert, MD 1/24/202112:03 PM

## 2019-03-15 DIAGNOSIS — E785 Hyperlipidemia, unspecified: Secondary | ICD-10-CM

## 2019-03-15 DIAGNOSIS — G894 Chronic pain syndrome: Secondary | ICD-10-CM

## 2019-03-15 DIAGNOSIS — E119 Type 2 diabetes mellitus without complications: Secondary | ICD-10-CM

## 2019-03-15 DIAGNOSIS — F314 Bipolar disorder, current episode depressed, severe, without psychotic features: Principal | ICD-10-CM

## 2019-03-15 DIAGNOSIS — Z794 Long term (current) use of insulin: Secondary | ICD-10-CM

## 2019-03-15 LAB — GLUCOSE, CAPILLARY
Glucose-Capillary: 182 mg/dL — ABNORMAL HIGH (ref 70–99)
Glucose-Capillary: 152 mg/dL — ABNORMAL HIGH (ref 70–99)
Glucose-Capillary: 182 mg/dL — ABNORMAL HIGH (ref 70–99)
Glucose-Capillary: 134 mg/dL — ABNORMAL HIGH (ref 70–99)

## 2019-03-15 MED ORDER — INSULIN GLARGINE 100 UNIT/ML ~~LOC~~ SOLN: 32.0000 [IU] | Freq: Every day | SUBCUTANEOUS | 0 refills | Status: DC

## 2019-03-15 MED ORDER — LAMOTRIGINE 200 MG PO TABS: 200.0000 mg | ORAL_TABLET | Freq: Every day | ORAL | 0 refills | Status: DC

## 2019-03-15 MED ORDER — BUPROPION HCL ER (XL) 150 MG PO TB24: 150.0000 mg | ORAL_TABLET | Freq: Every day | ORAL | 0 refills | Status: DC

## 2019-03-15 MED ORDER — FENOFIBRATE 160 MG PO TABS: 160.0000 mg | ORAL_TABLET | Freq: Every day | ORAL | 0 refills | Status: DC

## 2019-03-15 MED ORDER — INSULIN ASPART 100 UNIT/ML ~~LOC~~ SOLN: 4.0000 [IU] | Freq: Three times a day (TID) | SUBCUTANEOUS | 0 refills | Status: DC

## 2019-03-15 MED ORDER — INSULIN GLARGINE 100 UNIT/ML ~~LOC~~ SOLN
32.0000 [IU] | Freq: Every day | SUBCUTANEOUS | Status: DC
  Administered 2019-03-15: 32 [IU] via SUBCUTANEOUS
  Filled 2019-03-15 (×2): qty 0.32

## 2019-03-15 MED ORDER — TRAZODONE HCL 100 MG PO TABS: 250.0000 mg | ORAL_TABLET | Freq: Every day | ORAL | 0 refills | Status: DC

## 2019-03-15 MED ORDER — LUBIPROSTONE 8 MCG PO CAPS: 8.0000 ug | ORAL_CAPSULE | Freq: Every day | ORAL | 0 refills | Status: DC

## 2019-03-15 MED ORDER — QUETIAPINE FUMARATE 25 MG PO TABS: 25.0000 mg | ORAL_TABLET | Freq: Three times a day (TID) | ORAL | 0 refills | Status: DC

## 2019-03-15 MED ORDER — OLANZAPINE 10 MG PO TABS: 5.0000 mg | ORAL_TABLET | Freq: Every day | ORAL | 0 refills | Status: DC

## 2019-03-15 MED ORDER — TRAZODONE HCL 100 MG PO TABS
250.0000 mg | ORAL_TABLET | Freq: Every day | ORAL | Status: DC
  Administered 2019-03-15: 21:00:00 250 mg via ORAL
  Filled 2019-03-15: qty 3

## 2019-03-15 MED ORDER — ATORVASTATIN CALCIUM 10 MG PO TABS: 10.0000 mg | ORAL_TABLET | Freq: Every day | ORAL | 0 refills | Status: DC

## 2019-03-15 MED ORDER — MIRTAZAPINE 15 MG PO TABS: 15.0000 mg | ORAL_TABLET | Freq: Every day | ORAL | 0 refills | Status: DC

## 2019-03-15 MED ORDER — MONTELUKAST SODIUM 10 MG PO TABS: 10.0000 mg | ORAL_TABLET | Freq: Every day | ORAL | 0 refills | Status: DC

## 2019-03-15 NOTE — Plan of Care (Signed)
  Problem: Education: Goal: Knowledge of [facility] Education information/materials will improve Outcome: Progressing Goal: Emotional status will improve Outcome: Progressing Goal: Mental status will improve Outcome: Progressing Goal: Verbalization of understanding the information provided will improve Outcome: Progressing  D: Patient has been somewhat needy. Passive SI but contracts for safety. Denies SI HI and AVH.  A: Continue to monitor for safety R: Safety maintained

## 2019-03-15 NOTE — Progress Notes (Signed)
Recreation Therapy Notes  Date: 03/15/2019  Time: 9:30 am  Location: Craft room  Behavioral response: Appropriate  Intervention Topic: Goals   Discussion/Intervention:  Group content on today was focused on goals. Patients described what goals are and how they define goals. Individuals expressed how they go about setting goals and reaching them. The group identified how important goals are and if they make short term goals to reach long term goals. Patients described how many goals they work on at a time and what affects them not reaching their goal. Individuals described how much time they put into planning and obtaining their goals. The group participated in the intervention "My Goal Board" and made personal goal boards to help them achieve their goal. Clinical Observations/Feedback:  Patient came to group and was focused on what peers and staff had to say about goals. Individual was social with peers and staff while participating in the intervention during group.  Marilyn Davis LRT/CTRS         Marilyn Davis 03/15/2019 12:35 PM
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
## 2019-03-15 NOTE — Progress Notes (Signed)
D: Patient has been somewhat needy. Passive SI but contracts for safety. Denies SI HI and AVH.  A: Continue to monitor for safety R: Safety maintained

## 2019-03-15 NOTE — Plan of Care (Signed)
Patient appears with a flat affect,cooperative on approach.Stated that she feels better today.Denies SI,HI and AVH.Minimal interactions with peers.Compliant with medications.Appetite and energy level good.Attended groups.Support and encouragement given.

## 2019-03-15 NOTE — Plan of Care (Signed)
Patient stayed in the milieu until bedtime. Pleasant upon approach. Alert and oriented and denying SI/HI. Denying hallucinations. Patient was active in the milieu and had no concern. Received bedtime medications and had a snack. Currently in bed resting. Safety monitored as recommended.

## 2019-03-15 NOTE — [facility] Group Notes (Signed)
Overcoming Obstacles  03/15/2019 1PM  Type of Therapy and Topic:  Group Therapy:  Overcoming Obstacles  Participation Level:  Active    Description of Group:    In this group patients will be encouraged to explore what they see as obstacles to their own wellness and recovery. They will be guided to discuss their thoughts, feelings, and behaviors related to these obstacles. The group will process together ways to cope with barriers, with attention given to specific choices patients can make. Each patient will be challenged to identify changes they are motivated to make in order to overcome their obstacles. This group will be process-oriented, with patients participating in exploration of their own experiences as well as giving and receiving support and challenge from other group members.   Therapeutic Goals: 1. Patient will identify personal and current obstacles as they relate to admission. 2. Patient will identify barriers that currently interfere with their wellness or overcoming obstacles.  3. Patient will identify feelings, thought process and behaviors related to these barriers. 4. Patient will identify two changes they are willing to make to overcome these obstacles:      Summary of Patient Progress Actively and appropriately participated in todays session. Pt discussed with group when she is experiencing depression she loses the motivation to take care of her hygiene. Nevertheless, pt says she will be more mindful of the changes in her behavior and mood to avoid making negative decisions that will make it difficult to overcome the obstacle.    Therapeutic Modalities:   Cognitive Behavioral Therapy Solution Focused Therapy Motivational Interviewing Relapse Prevention Therapy    [name], MSW, LCSW 03/15/2019 1:46 PM

## 2019-03-16 LAB — GLUCOSE, CAPILLARY
Glucose-Capillary: 161 mg/dL — ABNORMAL HIGH (ref 70–99)
Glucose-Capillary: 190 mg/dL — ABNORMAL HIGH (ref 70–99)

## 2019-03-16 MED ORDER — FENOFIBRATE 160 MG PO TABS: 160.0000 mg | ORAL_TABLET | Freq: Every day | ORAL | 1 refills | Status: DC

## 2019-03-16 MED ORDER — LAMOTRIGINE 200 MG PO TABS: 200.0000 mg | ORAL_TABLET | Freq: Every day | ORAL | 1 refills | Status: DC

## 2019-03-16 MED ORDER — TRAZODONE HCL 100 MG PO TABS: 250.0000 mg | ORAL_TABLET | Freq: Every day | ORAL | 1 refills | Status: DC

## 2019-03-16 MED ORDER — FAMOTIDINE 20 MG PO TABS: 20.0000 mg | ORAL_TABLET | Freq: Two times a day (BID) | ORAL | 1 refills | Status: DC

## 2019-03-16 MED ORDER — MONTELUKAST SODIUM 10 MG PO TABS: 10.0000 mg | ORAL_TABLET | Freq: Every day | ORAL | 1 refills | Status: AC

## 2019-03-16 MED ORDER — QUETIAPINE FUMARATE 25 MG PO TABS: 25.0000 mg | ORAL_TABLET | Freq: Three times a day (TID) | ORAL | 1 refills | Status: DC

## 2019-03-16 MED ORDER — BUPROPION HCL ER (XL) 150 MG PO TB24: 150.0000 mg | ORAL_TABLET | Freq: Every day | ORAL | 1 refills | Status: DC

## 2019-03-16 MED ORDER — MIRTAZAPINE 15 MG PO TABS: 15.0000 mg | ORAL_TABLET | Freq: Every day | ORAL | 1 refills | Status: DC

## 2019-03-16 MED ORDER — OLANZAPINE 10 MG PO TABS: 5.0000 mg | ORAL_TABLET | Freq: Every day | ORAL | 1 refills | Status: DC

## 2019-03-16 MED ORDER — ATORVASTATIN CALCIUM 10 MG PO TABS: 10.0000 mg | ORAL_TABLET | Freq: Every day | ORAL | 1 refills | Status: AC

## 2019-03-16 MED ORDER — LUBIPROSTONE 8 MCG PO CAPS: 8.0000 ug | ORAL_CAPSULE | Freq: Every day | ORAL | 1 refills | Status: DC

## 2019-03-16 NOTE — Discharge Summary (Signed)
Physician Discharge Summary Note  Patient:  Marilyn Davis is an 64 y.o., female MRN:  [id number] DOB:  1955-10-23 Patient phone:  [phone] (home)  Patient address:   [address] [place] [address],  Total Time spent with patient: 30 minutes  Date of Admission:  03/14/2019 Date of Discharge: 03/16/19  Reason for Admission:  64 year old female with a past psychiatric history significant for reported bipolar disorder as well as probable borderline personality disorder who presented to the [facility] emergency department on 03/13/2019 with feeling "hypomanic".   Principal Problem: Bipolar 1 disorder, depressed, severe (HCC) Discharge Diagnoses: Principal Problem:   Bipolar 1 disorder, depressed, severe (HCC) Active Problems:   Diabetes (HCC)   Chronic pain syndrome   Dyslipidemia   Past Psychiatric History: Patient has had several psychiatric hospitalizations in her lifetime.  Her last hospitalization was in [place], [place] approximately 2 to 3 years ago.  She has been on the same medications since that hospitalization.  She has been diagnosed with bipolar disorder.  Past Medical History:  Past Medical History:  Diagnosis Date  . Bipolar disorder (HCC)   . Diabetes mellitus without complication (HCC)   . Sciatica     Past Surgical History:  Procedure Laterality Date  . ABDOMINAL HYSTERECTOMY    . EXPLORATORY LAPAROTOMY    . SHOULDER SURGERY    . TONSILLECTOMY     Family History: History reviewed. No pertinent family history. Family Psychiatric  History: Patient stated that her mother had borderline personality disorder and that her father suffered from bipolar disorder. Social History:  Social History   Substance and Sexual Activity  Alcohol Use Never     Social History   Substance and Sexual Activity  Drug Use Never    Social History   Socioeconomic History  . Marital status: Married    Spouse name: Not on file  .  Number of children: Not on file  . Years of education: Not on file  . Highest education level: Not on file  Occupational History  . Not on file  Tobacco Use  . Smoking status: Never Smoker  . Smokeless tobacco: Never Used  Substance and Sexual Activity  . Alcohol use: Never  . Drug use: Never  . Sexual activity: Not on file  Other Topics Concern  . Not on file  Social History Narrative  . Not on file   Social Determinants of Health   Financial Resource Strain:   . Difficulty of Paying Living Expenses: Not on file  Food Insecurity:   . Worried About Programme researcher, broadcasting/film/video in the Last Year: Not on file  . Ran Out of Food in the Last Year: Not on file  Transportation Needs:   . Lack of Transportation (Medical): Not on file  . Lack of Transportation (Non-Medical): Not on file  Physical Activity:   . Days of Exercise per Week: Not on file  . Minutes of Exercise per Session: Not on file  Stress:   . Feeling of Stress : Not on file  Social Connections:   . Frequency of Communication with Friends and Family: Not on file  . Frequency of Social Gatherings with Friends and Family: Not on file  . Attends Religious Services: Not on file  . Active Member of Clubs or Organizations: Not on file  . Attends Banker Meetings: Not on file  . Marital Status: Not on file    Hospital Course:  Patient remained on the [facility]  unit for 2 days. The patient stabilized on medication and therapy. Patient was discharged on Lipitor 10 mg p.o. daily, Wellbutrin XL 150 mg p.o. daily, Drisdol 1280 units weekly, Pepcid 20 mg p.o. twice daily, fenofibrate 160 mg p.o. daily, Vistaril 25 mg p.o. 3 times daily as needed, Lantus 32 units subcu daily at bedtime, Lamictal 200 mg p.o. nightly, Amitiza 8 mcg p.o. daily with breakfast, Remeron 15 mg p.o. nightly, Humalog sliding scale, Seroquel 25 mg p.o. 3 times daily, Zyprexa 5 mg p.o. nightly, Singulair 10 mg p.o. nightly, trazodone 250 mg p.o. nightly,  instructed patient to continue her home prescriptions of tramadol 50 mg p.o. every 6 hours as needed, and Ambien 5 mg p.o. nightly as needed for insomnia. Patient has shown improvement with improved mood, affect, sleep, appetite, and interaction. Patient has attended group and participated. Patient has been seen in the day room interacting with peers and staff appropriately. Patient denies any SI/HI/AVH and contracts for safety. Patient agrees to follow up at [facility]. Patient is provided with prescriptions for their medications upon discharge.   Physical Findings: AIMS: Facial and Oral Movements Muscles of Facial Expression: None, normal Lips and Perioral Area: None, normal Jaw: None, normal Tongue: None, normal,Extremity Movements Upper (arms, wrists, hands, fingers): None, normal Lower (legs, knees, ankles, toes): None, normal, Trunk Movements Neck, shoulders, hips: None, normal, Overall Severity Severity of abnormal movements (highest score from questions above): None, normal Incapacitation due to abnormal movements: None, normal Patient's awareness of abnormal movements (rate only patient's report): No Awareness, Dental Status Current problems with teeth and/or dentures?: No Does patient usually wear dentures?: No  CIWA:    COWS:     Musculoskeletal: Strength & Muscle Tone: within normal limits Gait & Station: normal Patient leans: N/A  Psychiatric Specialty Exam: Physical Exam  Nursing note and vitals reviewed. Constitutional: She is oriented to person, place, and time. She appears well-developed and well-nourished.  Cardiovascular: Normal rate.  Respiratory: Effort normal.  Musculoskeletal:        General: Normal range of motion.  Neurological: She is alert and oriented to person, place, and time.  Skin: Skin is warm.    Review of Systems  Constitutional: Negative.   HENT: Negative.   Eyes: Negative.   Respiratory: Negative.   Cardiovascular: Negative.    Gastrointestinal: Negative.   Genitourinary: Negative.   Musculoskeletal: Negative.   Skin: Negative.   Neurological: Negative.   Psychiatric/Behavioral: Negative.     Blood pressure (!) 97/48, pulse 93, temperature 98.5 F (36.9 C), temperature source Oral, resp. rate 18, height 5\' 5"  (1.651 m), weight 92.1 kg, SpO2 97 %.Body mass index is 33.78 kg/m.   General Appearance: Casual  Eye Contact::  Good  Speech:  Clear and Coherent409  Volume:  Normal  Mood:  Euthymic  Affect:  Congruent  Thought Process:  Goal Directed  Orientation:  Full (Time, Place, and Person)  Thought Content:  Logical  Suicidal Thoughts:  No  Homicidal Thoughts:  No  Memory:  Immediate;   Fair Recent;   Fair Remote;   Fair  Judgement:  Fair  Insight:  Fair  Psychomotor Activity:  Normal  Concentration:  Fair  Recall:  [employer] of Knowledge:Fair  Language: Fair  Akathisia:  No  Handed:  Right  AIMS (if indicated):     Assets:  Desire for Improvement Physical Health Resilience Social Support  Sleep:  Number of Hours: 7  Cognition: WNL  ADL's:  Intact  Have you used any form of tobacco in the last 30 days? (Cigarettes, Smokeless Tobacco, Cigars, and/or Pipes): No  Has this patient used any form of tobacco in the last 30 days? (Cigarettes, Smokeless Tobacco, Cigars, and/or Pipes) Yes, No  Blood Alcohol level:  Lab Results  Component Value Date   ETH <10 03/13/2019    Metabolic Disorder Labs:  Lab Results  Component Value Date   HGBA1C 6.2 (H) 03/13/2019   MPG 131.24 03/13/2019   No results found for: PROLACTIN No results found for: CHOL, TRIG, HDL, CHOLHDL, VLDL, LDLCALC  See Psychiatric Specialty Exam and Suicide Risk Assessment completed by Attending Physician prior to discharge.  Discharge destination:  Home  Is patient on multiple antipsychotic therapies at discharge:  No   Has Patient had three or more failed trials of antipsychotic monotherapy by history:   No  Recommended Plan for Multiple Antipsychotic Therapies: NA  Discharge Instructions    Diet - low sodium heart healthy   Complete by: As directed    Increase activity slowly   Complete by: As directed      Allergies as of 03/16/2019      Reactions   Sudafed [pseudoephedrine Hcl] Other (See Comments)   manic      Medication List    STOP taking these medications   ergocalciferol 1.25 MG (50000 UT) capsule Commonly known as: VITAMIN D2   fenofibrate micronized 200 MG capsule Commonly known as: LOFIBRA   insulin lispro 100 UNIT/ML injection Commonly known as: HUMALOG   Lantus SoloStar 100 UNIT/ML Solostar Pen Generic drug: Insulin Glargine Replaced by: insulin glargine 100 UNIT/ML injection     TAKE these medications     Indication  atorvastatin 10 MG tablet Commonly known as: LIPITOR Take 1 tablet (10 mg total) by mouth daily at 6 PM. What changed: when to take this  Indication: High Amount of Fats in the Blood   buPROPion 150 MG 24 hr tablet Commonly known as: WELLBUTRIN XL Take 1 tablet (150 mg total) by mouth daily.  Indication: Major Depressive Disorder   famotidine 20 MG tablet Commonly known as: PEPCID Take 1 tablet (20 mg total) by mouth 2 (two) times daily.  Indication: Gastroesophageal Reflux Disease   fenofibrate 160 MG tablet Take 1 tablet (160 mg total) by mouth daily.  Indication: High Amount of Triglycerides in the Blood   insulin aspart 100 UNIT/ML injection Commonly known as: novoLOG Inject 4 Units into the skin 3 (three) times daily with meals.  Indication: Type 2 Diabetes   insulin glargine 100 UNIT/ML injection Commonly known as: LANTUS Inject 0.32 mLs (32 Units total) into the skin at bedtime. Replaces: Lantus SoloStar 100 UNIT/ML Solostar Pen  Indication: Type 2 Diabetes   lamoTRIgine 200 MG tablet Commonly known as: LAMICTAL Take 1 tablet (200 mg total) by mouth at bedtime.  Indication: Depressive Phase of Manic-Depression    lubiprostone 8 MCG capsule Commonly known as: AMITIZA Take 1 capsule (8 mcg total) by mouth daily with breakfast. What changed: when to take this  Indication: Constipation caused by Irritable Bowel Syndrome   mirtazapine 15 MG tablet Commonly known as: REMERON Take 1 tablet (15 mg total) by mouth at bedtime.  Indication: Major Depressive Disorder   montelukast 10 MG tablet Commonly known as: SINGULAIR Take 1 tablet (10 mg total) by mouth at bedtime. What changed: when to take this  Indication: Perennial Allergic Rhinitis   OLANZapine 10 MG tablet Commonly known as: ZYPREXA Take 0.5 tablets (5  mg total) by mouth at bedtime.  Indication: Major Depressive Disorder   QUEtiapine 25 MG tablet Commonly known as: SEROQUEL Take 1 tablet (25 mg total) by mouth 3 (three) times daily.  Indication: Depressive Phase of Manic-Depression   traMADol 50 MG tablet Commonly known as: ULTRAM Take 50-100 mg by mouth every 4 (four) hours as needed for moderate pain or severe pain.  Indication: Pain   traZODone 100 MG tablet Commonly known as: DESYREL Take 2.5 tablets (250 mg total) by mouth at bedtime. What changed:   medication strength  how much to take  additional instructions  Another medication with the same name was removed. Continue taking this medication, and follow the directions you see here.  Indication: Trouble Sleeping   zolpidem 5 MG tablet Commonly known as: AMBIEN Take 5 mg by mouth at bedtime as needed for sleep.  Indication: Trouble Sleeping      Follow-up Information    [facility]. Call on 04/12/2019.   Why: The next available appointment for Quillian Quince is Apr 12, 2019 at 8:30AM.  The appointment will be a telehealth you should receive a call/text with the log in instructions. Contact information: [address] [place], [place] [address] Phone: [phone] Fax: [phone]          Follow-up recommendations:  Continue activity as  tolerated. Continue diet as recommended by your PCP. Ensure to keep all appointments with outpatient providers.  Comments:  Patient is instructed prior to discharge to: Take all medications as prescribed by his/her mental healthcare provider. Report any adverse effects and or reactions from the medicines to his/her outpatient provider promptly. Patient has been instructed & cautioned: To not engage in alcohol and or illegal drug use while on prescription medicines. In the event of worsening symptoms, patient is instructed to call the crisis hotline, 911 and or go to the nearest ED for appropriate evaluation and treatment of symptoms. To follow-up with his/her primary care provider for your other medical issues, concerns and or health care needs.    Signed: Gerlene Burdock Derrall Hicks, FNP 03/16/2019, 9:37 AM

## 2019-03-16 NOTE — Progress Notes (Signed)
D: Patient is aware of  Discharge this shift .  A:Patient denies suicidal /homicidal ideations. Patient received all belongings brought in   No Storage medications. Writer reviewed Discharge Summary, Suicide Risk Assessment, and Transitional Record. Patient also received Prescriptions   from  MD. A 7 day supply of medications given to patient . Aware  Of follow up appointment .  R: Patient left unit with no questions  Or concerns  With  family

## 2019-03-16 NOTE — Progress Notes (Signed)
Recreation Therapy Notes  Date: 03/16/2019  Time: 9:30 am  Location: Craft room  Behavioral response: Appropriate  Intervention Topic: Coping Skills   Discussion/Intervention:  Group content on today was focused on coping skills. The group defined what coping skills are and when they can be used. Individuals described how they normally cope with thing and the coping skills they normally use. Patients expressed why it is important to cope with things and how not coping with things can affect you. The group participated in the intervention "My coping box" and made coping boxes while adding coping skills they could use in the future to the box. Clinical Observations/Feedback:  Patient came to group and was focused on what peers and staff had to say about coping skills. Individual was social with peers and staff while participating in the intervention during group.  Marilyn Davis LRT/CTRS         Lleyton Byers 03/16/2019 12:20 PM

## 2019-03-16 NOTE — Progress Notes (Signed)
  [facility] Adult Case Management Discharge Plan :  Will you be returning to the same living situation after discharge:  Yes,  home At discharge, do you have transportation home?: Yes,  will call family to pick her up Do you have the ability to pay for your medications: Yes,  mental health  Release of information consent forms completed and in the chart;    Patient to Follow up at: Follow-up Information    [facility] [address]. Call on 04/12/2019.   Why: The next available appointment for Quillian Quince is Apr 12, 2019 at 8:30AM.  The appointment will be a telehealth you should receive a call/text with the log in instructions. Contact information: [address] [place], [place] [address] Phone: [phone] Fax: [phone]          Next level of care provider has access to [facility] Link:no  Safety Planning and Suicide Prevention discussed: Yes,  CPS completed with pts husband  Have you used any form of tobacco in the last 30 days? (Cigarettes, Smokeless Tobacco, Cigars, and/or Pipes): No  Has patient been referred to the Quitline?: N/A patient is not a smoker  Patient has been referred for addiction treatment: N/A  Mechele Dawley, LCSW 03/16/2019, 9:12 AM

## 2019-03-16 NOTE — Tx Team (Signed)
Interdisciplinary Treatment and Diagnostic Plan Update  03/16/2019 Time of Session: 900am Marilyn Davis MRN: [id number]  Principal Diagnosis: Bipolar 1 disorder, depressed, severe (HCC)  Secondary Diagnoses: Principal Problem:   Bipolar 1 disorder, depressed, severe (HCC) Active Problems:   Diabetes (HCC)   Chronic pain syndrome   Dyslipidemia   Current Medications:  Current Facility-Administered Medications  Medication Dose Route Frequency Provider Last Rate Last Admin  . acetaminophen (TYLENOL) tablet 650 mg  650 mg Oral Q6H PRN Dixon, Rashaun M, NP      . alum & mag hydroxide-simeth (MAALOX/MYLANTA) 200-200-20 MG/5ML suspension 30 mL  30 mL Oral Q4H PRN Dixon, Rashaun M, NP      . atorvastatin (LIPITOR) tablet 10 mg  10 mg Oral q1800 Antonieta Pert, MD   10 mg at 03/15/19 1635  . buPROPion (WELLBUTRIN XL) 24 hr tablet 150 mg  150 mg Oral Daily Antonieta Pert, MD   150 mg at 03/16/19 0757  . ergocalciferol (DRISDOL) 200 MCG/ML drops 1,280 Units  1,280 Units Oral Weekly Antonieta Pert, MD      . famotidine (PEPCID) tablet 20 mg  20 mg Oral BID Antonieta Pert, MD   20 mg at 03/16/19 0757  . fenofibrate tablet 160 mg  160 mg Oral Daily Antonieta Pert, MD   160 mg at 03/16/19 0757  . hydrOXYzine (ATARAX/VISTARIL) tablet 25 mg  25 mg Oral TID PRN Jearld Lesch, NP      . influenza vac split quadrivalent PF (FLUARIX) injection 0.5 mL  0.5 mL Intramuscular Tomorrow-1000 Clapacs, John T, MD      . insulin aspart (novoLOG) injection 0-15 Units  0-15 Units Subcutaneous TID WC Antonieta Pert, MD   3 Units at 03/16/19 0801  . insulin aspart (novoLOG) injection 4 Units  4 Units Subcutaneous TID WC Antonieta Pert, MD   4 Units at 03/16/19 0759  . insulin glargine (LANTUS) injection 32 Units  32 Units Subcutaneous QHS Clapacs, Jackquline Denmark, MD   32 Units at 03/15/19 2112  . lamoTRIgine (LAMICTAL) tablet 200 mg  200 mg Oral QHS Antonieta Pert, MD   200 mg at 03/15/19  2112  . lubiprostone (AMITIZA) capsule 8 mcg  8 mcg Oral Q breakfast Antonieta Pert, MD   8 mcg at 03/16/19 0757  . magnesium hydroxide (MILK OF MAGNESIA) suspension 30 mL  30 mL Oral Daily PRN Dixon, Rashaun M, NP      . mirtazapine (REMERON) tablet 15 mg  15 mg Oral QHS Antonieta Pert, MD   15 mg at 03/15/19 2112  . montelukast (SINGULAIR) tablet 10 mg  10 mg Oral QHS Antonieta Pert, MD   10 mg at 03/15/19 2112  . OLANZapine (ZYPREXA) tablet 5 mg  5 mg Oral QHS Antonieta Pert, MD   5 mg at 03/15/19 2112  . pneumococcal 23 valent vaccine (PNEUMOVAX-23) injection 0.5 mL  0.5 mL Intramuscular Tomorrow-1000 Clapacs, John T, MD      . QUEtiapine (SEROQUEL) tablet 25 mg  25 mg Oral TID Antonieta Pert, MD   25 mg at 03/16/19 0757  . traMADol (ULTRAM) tablet 50 mg  50 mg Oral Q6H PRN Antonieta Pert, MD      . traZODone (DESYREL) tablet 250 mg  250 mg Oral QHS Clapacs, Jackquline Denmark, MD   250 mg at 03/15/19 2112  . zolpidem (AMBIEN) tablet 5 mg  5 mg Oral QHS PRN Antonieta Pert, MD  PTA Medications: Medications Prior to Admission  Medication Sig Dispense Refill Last Dose  . atorvastatin (LIPITOR) 10 MG tablet Take 10 mg by mouth at bedtime.     . ergocalciferol (VITAMIN D2) 1.25 MG (50000 UT) capsule Take 50,000 Units by mouth once a week.     . fenofibrate micronized (LOFIBRA) 200 MG capsule Take 200 mg by mouth daily.     . Insulin Glargine (LANTUS SOLOSTAR) 100 UNIT/ML Solostar Pen Inject 32 Units into the skin daily.     . insulin lispro (HUMALOG) 100 UNIT/ML injection Inject 4 Units into the skin 4 (four) times daily -  before meals and at bedtime. (as needed)     . lubiprostone (AMITIZA) 8 MCG capsule Take 8 mcg by mouth daily.     . montelukast (SINGULAIR) 10 MG tablet Take 10 mg by mouth daily.     . traMADol (ULTRAM) 50 MG tablet Take 50-100 mg by mouth every 4 (four) hours as needed for moderate pain or severe pain.      . traZODone (DESYREL) 100 MG tablet Take 100  mg by mouth at bedtime. (take with 150mg  dose to equal 250mg )     . traZODone (DESYREL) 150 MG tablet Take 150 mg by mouth at bedtime. (take with 100mg  dose to equal 250mg )     . zolpidem (AMBIEN) 5 MG tablet Take 5 mg by mouth at bedtime as needed for sleep.     . [DISCONTINUED] buPROPion (WELLBUTRIN XL) 150 MG 24 hr tablet Take 150 mg by mouth daily.     . [DISCONTINUED] famotidine (PEPCID) 20 MG tablet Take 20 mg by mouth 2 (two) times daily.     . [DISCONTINUED] lamoTRIgine (LAMICTAL) 200 MG tablet Take 200 mg by mouth at bedtime.     . [DISCONTINUED] mirtazapine (REMERON) 15 MG tablet Take 15 mg by mouth at bedtime.     . [DISCONTINUED] OLANZapine (ZYPREXA) 10 MG tablet Take 5 mg by mouth at bedtime.     . [DISCONTINUED] QUEtiapine (SEROQUEL) 25 MG tablet Take 25 mg by mouth 3 (three) times daily.       Patient Stressors: Financial difficulties  Patient Strengths: Ability for insight Average or above average intelligence Capable of independent living fund of knowledge Motivation for treatment/growth  Treatment Modalities: Medication Management, Group therapy, Case management,  1 to 1 session with clinician, Psychoeducation, Recreational therapy.   Physician Treatment Plan for Primary Diagnosis: Bipolar 1 disorder, depressed, severe (HCC) Long Term Goal(s): Improvement in symptoms so as ready for discharge Improvement in symptoms so as ready for discharge   Short Term Goals: Ability to identify changes in lifestyle to reduce recurrence of condition will improve Ability to verbalize feelings will improve Ability to disclose and discuss suicidal ideas Ability to demonstrate self-control will improve Ability to identify and develop effective coping behaviors will improve Ability to maintain clinical measurements within normal limits will improve Ability to identify changes in lifestyle to reduce recurrence of condition will improve Ability to verbalize  feelings will improve Ability to disclose and discuss suicidal ideas Ability to demonstrate self-control will improve Ability to identify and develop effective coping behaviors will improve Ability to maintain clinical measurements within normal limits will improve  Medication Management: Evaluate patient's response, side effects, and tolerance of medication regimen.  Therapeutic Interventions: 1 to 1 sessions, Unit Group sessions and Medication administration.  Evaluation of Outcomes: Adequate for Discharge  Physician Treatment Plan for Secondary Diagnosis: Principal Problem:   Bipolar  1 disorder, depressed, severe ([place]) Active Problems:   Diabetes ([place])   Chronic pain syndrome   Dyslipidemia  Long Term Goal(s): Improvement in symptoms so as ready for discharge Improvement in symptoms so as ready for discharge   Short Term Goals: Ability to identify changes in lifestyle to reduce recurrence of condition will improve Ability to verbalize feelings will improve Ability to disclose and discuss suicidal ideas Ability to demonstrate self-control will improve Ability to identify and develop effective coping behaviors will improve Ability to maintain clinical measurements within normal limits will improve Ability to identify changes in lifestyle to reduce recurrence of condition will improve Ability to verbalize feelings will improve Ability to disclose and discuss suicidal ideas Ability to demonstrate self-control will improve Ability to identify and develop effective coping behaviors will improve Ability to maintain clinical measurements within normal limits will improve     Medication Management: Evaluate patient's response, side effects, and tolerance of medication regimen.  Therapeutic Interventions: 1 to 1 sessions, Unit Group sessions and Medication administration.  Evaluation of Outcomes: Adequate for Discharge   RN Treatment Plan for Primary Diagnosis: Bipolar 1 disorder,  depressed, severe ([place]) Long Term Goal(s): Knowledge of disease and therapeutic regimen to maintain health will improve  Short Term Goals: Ability to demonstrate self-control, Ability to participate in decision making will improve, Ability to verbalize feelings will improve and Ability to identify and develop effective coping behaviors will improve  Medication Management: RN will administer medications as ordered by provider, will assess and evaluate patient's response and provide education to patient for prescribed medication. RN will report any adverse and/or side effects to prescribing provider.  Therapeutic Interventions: 1 on 1 counseling sessions, Psychoeducation, Medication administration, Evaluate responses to treatment, Monitor vital signs and CBGs as ordered, Perform/monitor CIWA, COWS, AIMS and Fall Risk screenings as ordered, Perform wound care treatments as ordered.  Evaluation of Outcomes: Adequate for Discharge   LCSW Treatment Plan for Primary Diagnosis: Bipolar 1 disorder, depressed, severe ([place]) Long Term Goal(s): Safe transition to appropriate next level of care at discharge, Engage patient in therapeutic group addressing interpersonal concerns.  Short Term Goals: Engage patient in aftercare planning with referrals and resources, Increase emotional regulation and Increase skills for wellness and recovery  Therapeutic Interventions: Assess for all discharge needs, 1 to 1 time with Social worker, Explore available resources and support systems, Assess for adequacy in community support network, Educate family and significant other(s) on suicide prevention, Complete Psychosocial Assessment, Interpersonal group therapy.  Evaluation of Outcomes: Adequate for Discharge   Progress in Treatment: Attending groups: Yes. Participating in groups: Yes. Taking medication as prescribed: Yes. Toleration medication: Yes. Family/Significant other contact made: Yes, individual(s) contacted:   pts husband Patient understands diagnosis: Yes. Discussing patient identified problems/goals with staff: Yes. Medical problems stabilized or resolved: Yes. Denies suicidal/homicidal ideation: Yes. Issues/concerns per patient self-inventory: No. Other: N/A  New problem(s) identified: No, Describe:  none  New Short Term/Long Term Goal(s): Detox, elimination of AVH/symptoms of psychosis, medication management for mood stabilization; elimination of SI thoughts; development of comprehensive mental wellness/sobriety plan.   Patient Goals:  "Going to Groups"  Discharge Plan or Barriers: SPE pamphlet, Mobile Crisis information, and AA/NA information provided to patient for additional community support and resources.  Reason for Continuation of Hospitalization: none  Estimated Length of Stay: Today 03/16/19  Attendees: Patient: Marilyn Davis 03/16/2019 10:00 AM  Physician: Dr Weber Cooks MD 03/16/2019 10:00 AM  Nursing:  03/16/2019 10:00 AM  RN Care Manager: 03/16/2019 10:00  AM  Social Worker: Iris Pert LCSW 03/16/2019 10:00 AM  Recreational Therapist: Garret Reddish CTRS LRT 03/16/2019 10:00 AM  Other: [name] LCSW  03/16/2019 10:00 AM  Other: Penni Homans LCSW 03/16/2019 10:00 AM  Other: 03/16/2019 10:00 AM    Scribe for Treatment Team: Charlann Lange Dianna Ewald, LCSW 03/16/2019 10:00 AM

## 2019-03-16 NOTE — Progress Notes (Signed)
Patient slept throughout the night and did not have any sign of discomfort. Woke up on time and completed morning routine activities. Patient performed hygiene and vital signs checked. CBG 163 at 0700.

## 2019-03-16 NOTE — BHH Suicide Risk Assessment (Signed)
[facility] Discharge Suicide Risk Assessment   Principal Problem: Bipolar 1 disorder, depressed, severe (HCC) Discharge Diagnoses: Principal Problem:   Bipolar 1 disorder, depressed, severe (HCC) Active Problems:   Diabetes (HCC)   Chronic pain syndrome   Dyslipidemia   Total Time spent with patient: 30 minutes  Musculoskeletal: Strength & Muscle Tone: within normal limits Gait & Station: normal Patient leans: N/A  Psychiatric Specialty Exam: Review of Systems  Constitutional: Negative.   HENT: Negative.   Eyes: Negative.   Respiratory: Negative.   Cardiovascular: Negative.   Gastrointestinal: Negative.   Musculoskeletal: Negative.   Skin: Negative.   Neurological: Negative.   Psychiatric/Behavioral: Negative.     Blood pressure (!) 97/48, pulse 93, temperature 98.5 F (36.9 C), temperature source Oral, resp. rate 18, height 5\' 5"  (1.651 m), weight 92.1 kg, SpO2 97 %.Body mass index is 33.78 kg/m.  General Appearance: Casual  Eye Contact::  Good  Speech:  Clear and Coherent409  Volume:  Normal  Mood:  Euthymic  Affect:  Congruent  Thought Process:  Goal Directed  Orientation:  Full (Time, Place, and Person)  Thought Content:  Logical  Suicidal Thoughts:  No  Homicidal Thoughts:  No  Memory:  Immediate;   Fair Recent;   Fair Remote;   Fair  Judgement:  Fair  Insight:  Fair  Psychomotor Activity:  Normal  Concentration:  Fair  Recall:  [id number] of Knowledge:Fair  Language: Fair  Akathisia:  No  Handed:  Right  AIMS (if indicated):     Assets:  Desire for Improvement Physical Health Resilience Social Support  Sleep:  Number of Hours: 7  Cognition: WNL  ADL's:  Intact   Mental Status Per Nursing Assessment::   On Admission:  Suicidal ideation indicated by patient, Suicidal ideation indicated by others, Self-harm thoughts  Demographic Factors:  Caucasian and Low socioeconomic status  Loss Factors: Financial problems/change in socioeconomic  status  Historical Factors: NA  Risk Reduction Factors:   Living with another person, especially a relative and Positive social support  Continued Clinical Symptoms:  Depression:   Anhedonia  Cognitive Features That Contribute To Risk:  None    Suicide Risk:  Minimal: No identifiable suicidal ideation.  Patients presenting with no risk factors but with morbid ruminations; may be classified as minimal risk based on the severity of the depressive symptoms  Follow-up Information    [facility]. Call on 04/12/2019.   Why: The next available appointment for 04/14/2019 is Apr 12, 2019 at 8:30AM.  The appointment will be a telehealth you should receive a call/text with the log in instructions. Contact information: [address] [place], [place] Phone: [phone] Fax: [phone]          Plan Of Care/Follow-up recommendations:  Activity:  Activity as tolerated Diet:  Regular diet Other:  Follow-up as recommended with outpatient provider while continuing current medication.  [phone], MD 03/16/2019, 9:27 AM

## 2019-03-16 NOTE — Plan of Care (Signed)
  Problem: Activity: Goal: Interest or engagement in activities will improve Outcome: Adequate for Discharge Goal: Sleeping patterns will improve Outcome: Adequate for Discharge   Problem: Coping: Goal: Ability to verbalize frustrations and anger appropriately will improve Outcome: Adequate for Discharge Goal: Ability to demonstrate self-control will improve Outcome: Adequate for Discharge   Problem: Physical Regulation: Goal: Ability to maintain clinical measurements within normal limits will improve Outcome: Adequate for Discharge   Problem: Safety: Goal: Periods of time without injury will increase Outcome: Adequate for Discharge   Problem: Education: Goal: Ability to make informed decisions regarding treatment will improve Outcome: Adequate for Discharge   Problem: Coping: Goal: Coping ability will improve Outcome: Adequate for Discharge   Problem: Health Behavior/Discharge Planning: Goal: Identification of resources available to assist in meeting health care needs will improve Outcome: Adequate for Discharge   Problem: Self-Concept: Goal: Ability to disclose and discuss suicidal ideas will improve Outcome: Adequate for Discharge Goal: Will verbalize positive feelings about self Outcome: Adequate for Discharge   Problem: Medication: Goal: Compliance with prescribed medication regimen will improve Outcome: Adequate for Discharge

## 2019-05-17 ENCOUNTER — Ambulatory Visit: Payer: Self-pay | Attending: Internal Medicine

## 2019-05-17 ENCOUNTER — Other Ambulatory Visit: Payer: Self-pay

## 2019-05-17 DIAGNOSIS — Z23 Encounter for immunization: Secondary | ICD-10-CM

## 2019-05-17 NOTE — Progress Notes (Signed)
Covid-19 Vaccination Clinic  Name:  Marilyn Davis    MRN: [id number] DOB: 04/18/1955  05/17/2019  Marilyn Davis was observed post Covid-19 immunization for 15 minutes without incident. She was provided with Vaccine Information Sheet and instruction to access the V-Safe system.   Marilyn Davis was instructed to call 911 with any severe reactions post vaccine: Marland Kitchen Difficulty breathing  . Swelling of face and throat  . A fast heartbeat  . A bad rash all over body  . Dizziness and weakness   Immunizations Administered    Name Date Dose VIS Date Route   Pfizer COVID-19 Vaccine 05/17/2019  1:36 PM 0.3 mL 01/29/2019 Intramuscular   Manufacturer: [employer], [employer]   Lot: [id number]   NDC: [address]-4

## 2019-06-09 ENCOUNTER — Ambulatory Visit: Payer: Self-pay | Attending: Internal Medicine

## 2019-06-09 DIAGNOSIS — Z23 Encounter for immunization: Secondary | ICD-10-CM

## 2019-06-09 NOTE — Progress Notes (Signed)
Covid-19 Vaccination Clinic  Name:  Marilyn Davis    MRN: [id number] DOB: [birth year]  06/09/2019  [name] was observed post Covid-19 immunization for 15 minutes without incident. She was provided with Vaccine Information Sheet and instruction to access the V-Safe system.   [name] was instructed to call 911 with any severe reactions post vaccine: Marland Kitchen Difficulty breathing  . Swelling of face and throat  . A fast heartbeat  . A bad rash all over body  . Dizziness and weakness   Immunizations Administered    Name Date Dose VIS Date Route   Pfizer COVID-19 Vaccine 06/09/2019  2:10 PM 0.3 mL 04/14/2018 Intramuscular   Manufacturer: [employer], [employer]   Lot: [id number]   NDC: [address]-4

## 2019-06-25 ENCOUNTER — Emergency Department
Admission: EM | Admit: 2019-06-25 | Discharge: 2019-06-25 | Disposition: A | Discharge status: Home / Self Care | Payer: Self-pay | Attending: Emergency Medicine | Admitting: Emergency Medicine

## 2019-06-25 ENCOUNTER — Other Ambulatory Visit: Payer: Self-pay

## 2019-06-25 ENCOUNTER — Encounter: Payer: Self-pay | Admitting: Emergency Medicine

## 2019-06-25 DIAGNOSIS — Z794 Long term (current) use of insulin: Secondary | ICD-10-CM | POA: Insufficient documentation

## 2019-06-25 DIAGNOSIS — E1165 Type 2 diabetes mellitus with hyperglycemia: Secondary | ICD-10-CM | POA: Insufficient documentation

## 2019-06-25 LAB — URINALYSIS, COMPLETE (UACMP) WITH MICROSCOPIC
Bacteria, UA: NONE SEEN
Bilirubin Urine: NEGATIVE
Glucose, UA: >=500 mg/dL — AB
Hgb urine dipstick: NEGATIVE
Ketones, ur: NEGATIVE mg/dL
Leukocytes,Ua: NEGATIVE
Nitrite: NEGATIVE
Protein, ur: NEGATIVE mg/dL
Specific Gravity, Urine: 1.008 (ref 1.005–1.030)
pH: 6 (ref 5.0–8.0)

## 2019-06-25 LAB — CBC
HCT: 45.6 % (ref 36.0–46.0)
Hemoglobin: 15.1 g/dL — ABNORMAL HIGH (ref 12.0–15.0)
MCH: 27.7 pg (ref 26.0–34.0)
MCHC: 33.1 g/dL (ref 30.0–36.0)
MCV: 83.5 fL (ref 80.0–100.0)
Platelets: 197 10*3/uL (ref 150–400)
RBC: 5.46 MIL/uL — ABNORMAL HIGH (ref 3.87–5.11)
RDW: 12.5 % (ref 11.5–15.5)
WBC: 8.6 10*3/uL (ref 4.0–10.5)
nRBC: 0 % (ref 0.0–0.2)

## 2019-06-25 LAB — GLUCOSE, CAPILLARY
Glucose-Capillary: 458 mg/dL — ABNORMAL HIGH (ref 70–99)
Glucose-Capillary: 322 mg/dL — ABNORMAL HIGH (ref 70–99)
Glucose-Capillary: 326 mg/dL — ABNORMAL HIGH (ref 70–99)

## 2019-06-25 LAB — BASIC METABOLIC PANEL
Anion gap: 9 (ref 5–15)
BUN: 15 mg/dL (ref 8–23)
CO2: 21 mmol/L — ABNORMAL LOW (ref 22–32)
Calcium: 9.2 mg/dL (ref 8.9–10.3)
Chloride: 104 mmol/L (ref 98–111)
Creatinine, Ser: 1.16 mg/dL — ABNORMAL HIGH (ref 0.44–1.00)
GFR calc Af Amer: 58 mL/min — ABNORMAL LOW (ref >60)
GFR calc non Af Amer: 50 mL/min — ABNORMAL LOW (ref >60)
Glucose, Bld: 494 mg/dL — ABNORMAL HIGH (ref 70–99)
Potassium: 4.4 mmol/L (ref 3.5–5.1)
Sodium: 134 mmol/L — ABNORMAL LOW (ref 135–145)

## 2019-06-25 MED ORDER — SODIUM CHLORIDE 0.9 % IV BOLUS
1000.0000 mL | Freq: Once | INTRAVENOUS | Status: AC
  Administered 2019-06-25: 1000 mL via INTRAVENOUS

## 2019-06-25 MED ORDER — INSULIN GLARGINE 100 UNIT/ML ~~LOC~~ SOLN
10.0000 [IU] | Freq: Once | SUBCUTANEOUS | Status: AC
  Administered 2019-06-25: 16:00:00 10 [IU] via SUBCUTANEOUS
  Filled 2019-06-25: qty 0.1

## 2019-06-25 MED ORDER — INSULIN GLARGINE 100 UNIT/ML SOLOSTAR PEN: 32.0000 [IU] | PEN_INJECTOR | Freq: Every day | SUBCUTANEOUS | 6 refills | Status: DC

## 2019-06-25 NOTE — ED Provider Notes (Signed)
[facility] Emergency Department Provider Note  ____________________________________________  Time seen: Approximately 2:33 PM  I have reviewed the triage vital signs and the nursing notes.   HISTORY  Chief Complaint Hyperglycemia    HPI Marilyn Davis is a 64 y.o. female who presents the emergency department complaining of high blood sugar.  Patient states that she is a type II diabetic but is on insulin exclusively to manage her diabetes.  Patient states that she received the wrong prescription for insulin and it is not been able to get a correct short acting insulin prescription in the past month.  She still using her long-acting insulin but states that her blood sugar has been running higher without the appropriate medications.  Patient states that today she ate a blueberry muffin, checked her blood sugar and noticed that it read over 500.  She has been advised to present to the emergency department anytime her blood sugar runs greater than 500.  Patient denies any other complaints or symptoms.  She states that her blood sugar typically stays between the 150s and 250s.  She has never had DKA.  Patient states that other than seeing a high reading on her monitor she has no other complaints.         Past Medical History:  Diagnosis Date  . Bipolar disorder (HCC)   . Diabetes mellitus without complication (HCC)   . Sciatica     Patient Active Problem List   Diagnosis Date Noted  . Diabetes (HCC) 03/15/2019  . Chronic pain syndrome 03/15/2019  . Dyslipidemia 03/15/2019  . Bipolar 1 disorder, depressed, severe (HCC) 03/14/2019    Past Surgical History:  Procedure Laterality Date  . ABDOMINAL HYSTERECTOMY    . EXPLORATORY LAPAROTOMY    . SHOULDER SURGERY    . TONSILLECTOMY      Prior to Admission medications   Medication Sig Start Date End Date Taking? Authorizing Provider  atorvastatin (LIPITOR) 10 MG tablet Take 1 tablet (10 mg total) by  mouth daily at 6 PM. 03/16/19   Money, Gerlene Burdock, FNP  buPROPion (WELLBUTRIN XL) 150 MG 24 hr tablet Take 1 tablet (150 mg total) by mouth daily. 03/16/19   Money, Gerlene Burdock, FNP  famotidine (PEPCID) 20 MG tablet Take 1 tablet (20 mg total) by mouth 2 (two) times daily. 03/16/19   Money, Gerlene Burdock, FNP  fenofibrate 160 MG tablet Take 1 tablet (160 mg total) by mouth daily. 03/16/19   Money, Gerlene Burdock, FNP  insulin aspart (NOVOLOG) 100 UNIT/ML injection Inject 4 Units into the skin 3 (three) times daily with meals. 03/15/19   Clapacs, Jackquline Denmark, MD  insulin glargine (LANTUS) 100 UNIT/ML Solostar Pen Inject 32 Units into the skin at bedtime. 06/25/19   Leiah Giannotti, Delorise Royals, PA-C  lamoTRIgine (LAMICTAL) 200 MG tablet Take 1 tablet (200 mg total) by mouth at bedtime. 03/16/19   Money, Gerlene Burdock, FNP  lubiprostone (AMITIZA) 8 MCG capsule Take 1 capsule (8 mcg total) by mouth daily with breakfast. 03/16/19   Money, Gerlene Burdock, FNP  mirtazapine (REMERON) 15 MG tablet Take 1 tablet (15 mg total) by mouth at bedtime. 03/16/19   Money, Gerlene Burdock, FNP  montelukast (SINGULAIR) 10 MG tablet Take 1 tablet (10 mg total) by mouth at bedtime. 03/16/19   Money, Gerlene Burdock, FNP  OLANZapine (ZYPREXA) 10 MG tablet Take 0.5 tablets (5 mg total) by mouth at bedtime. 03/16/19   Money, Gerlene Burdock, FNP  QUEtiapine (SEROQUEL) 25 MG tablet Take  1 tablet (25 mg total) by mouth 3 (three) times daily. 03/16/19   Money, Gerlene Burdock, FNP  traMADol (ULTRAM) 50 MG tablet Take 50-100 mg by mouth every 4 (four) hours as needed for moderate pain or severe pain.  02/26/19   [provider]  traZODone (DESYREL) 100 MG tablet Take 2.5 tablets (250 mg total) by mouth at bedtime. 03/16/19   Money, Gerlene Burdock, FNP  zolpidem (AMBIEN) 5 MG tablet Take 5 mg by mouth at bedtime as needed for sleep.    [provider]    Allergies Sudafed [pseudoephedrine hcl]  History reviewed. No pertinent family history.  Social History Social History   Tobacco Use  .  Smoking status: Never Smoker  . Smokeless tobacco: Never Used  Substance Use Topics  . Alcohol use: Never  . Drug use: Never     Review of Systems  Constitutional: No fever/chills Eyes: No visual changes. No discharge ENT: No upper respiratory complaints. Cardiovascular: no chest pain. Respiratory: no cough. No SOB. Gastrointestinal: No abdominal pain.  No nausea, no vomiting.  No diarrhea.  No constipation. Genitourinary: Negative for dysuria. No hematuria Musculoskeletal: Negative for musculoskeletal pain. Skin: Negative for rash, abrasions, lacerations, ecchymosis. Neurological: Negative for headaches, focal weakness or numbness. 10-point ROS otherwise negative.  ____________________________________________   PHYSICAL EXAM:  VITAL SIGNS: ED Triage Vitals  Enc Vitals Group     BP 06/25/19 1328 123/70     Pulse Rate 06/25/19 1328 99     Resp 06/25/19 1328 18     Temp 06/25/19 1328 98.2 F (36.8 C)     Temp Source 06/25/19 1328 Oral     SpO2 06/25/19 1328 94 %     Weight 06/25/19 1320 200 lb (90.7 kg)     Height 06/25/19 1320 5\' 5"  (1.651 m)     Head Circumference --      Peak Flow --      Pain Score 06/25/19 1320 0     Pain Loc --      Pain Edu? --      Excl. in GC? --      Constitutional: Alert and oriented. Well appearing and in no acute distress. Eyes: Conjunctivae are normal. PERRL. EOMI. Head: Atraumatic. ENT:      Ears:       Nose: No congestion/rhinnorhea.      Mouth/Throat: Mucous membranes are moist.  Neck: No stridor.    Cardiovascular: Normal rate, regular rhythm. Normal S1 and S2.  Good peripheral circulation. Respiratory: Normal respiratory effort without tachypnea or retractions. Lungs CTAB. Good air entry to the bases with no decreased or absent breath sounds. Musculoskeletal: Full range of motion to all extremities. No gross deformities appreciated. Neurologic:  Normal speech and language. No gross focal neurologic deficits are appreciated.   Skin:  Skin is warm, dry and intact. No rash noted. Psychiatric: Mood and affect are normal. Speech and behavior are normal. Patient exhibits appropriate insight and judgement.   ____________________________________________   LABS (all labs ordered are listed, but only abnormal results are displayed)  Labs Reviewed  BASIC METABOLIC PANEL - Abnormal; Notable for the following components:      Result Value   Sodium 134 (*)    CO2 21 (*)    Glucose, Bld 494 (*)    Creatinine, Ser 1.16 (*)    GFR calc non Af Amer 50 (*)    GFR calc Af Amer 58 (*)    All other components within normal limits  CBC - Abnormal; Notable for the following components:   RBC 5.46 (*)    Hemoglobin 15.1 (*)    All other components within normal limits  URINALYSIS, COMPLETE (UACMP) WITH MICROSCOPIC - Abnormal; Notable for the following components:   Color, Urine STRAW (*)    APPearance CLEAR (*)    Glucose, UA >=500 (*)    All other components within normal limits  GLUCOSE, CAPILLARY - Abnormal; Notable for the following components:   Glucose-Capillary 458 (*)    All other components within normal limits  GLUCOSE, CAPILLARY - Abnormal; Notable for the following components:   Glucose-Capillary 322 (*)    All other components within normal limits  GLUCOSE, CAPILLARY - Abnormal; Notable for the following components:   Glucose-Capillary 326 (*)    All other components within normal limits  CBG MONITORING, ED  CBG MONITORING, ED  CBG MONITORING, ED   ____________________________________________  EKG   ____________________________________________  RADIOLOGY   No results found.  ____________________________________________    PROCEDURES  Procedure(s) performed:    Procedures    Medications  sodium chloride 0.9 % bolus 1,000 mL (0 mLs Intravenous Stopped 06/25/19 1603)  insulin glargine (LANTUS) injection 10 Units (10 Units Subcutaneous Given 06/25/19 1600)      ____________________________________________   INITIAL IMPRESSION / ASSESSMENT AND PLAN / ED COURSE  Pertinent labs & imaging results that were available during my care of the patient were reviewed by me and considered in my medical decision making (see chart for details).  Review of the [place] CSRS was performed in accordance of the [place] prior to dispensing any controlled drugs.           Patient's diagnosis is consistent with hyperglycemia due to diabetes.  Patient presented to emergency department with high reading on her at home glucose monitor.  She states that she is supposed to be on Lantus and NovoLog.  She states that her Lantus prescription has been messed up and she needs a new prescription for the Lantus pens.  Patient has no complaints.  Labs revealed hyperglycemia with no evidence of DKA.  Patient is given fluids, insulin here in the emergency department.  Patient's blood glucose improved while in the emergency department and she is stable for discharge.  Return cautions discussed with the patient at length.  Patient is given prescription for her Lantus pens for use at home..  Patient is given ED precautions to return to the ED for any worsening or new symptoms.     ____________________________________________  FINAL CLINICAL IMPRESSION(S) / ED DIAGNOSES  Final diagnoses:  Hyperglycemia due to diabetes mellitus ([place])      NEW MEDICATIONS STARTED DURING THIS VISIT:  ED Discharge Orders         Ordered    insulin glargine (LANTUS) 100 UNIT/ML Solostar Pen  Daily at bedtime     06/25/19 1604              This chart was dictated using voice recognition software/Dragon. Despite best efforts to proofread, errors can occur which can change the meaning. Any change was purely unintentional.    Darletta Moll, PA-C 06/25/19 1804    Harvest Dark, MD 06/26/19 2026

## 2019-06-25 NOTE — ED Triage Notes (Signed)
First nurse note- reports blood sugar at home was over 500. Ambulatory. NAD

## 2019-06-25 NOTE — ED Triage Notes (Addendum)
Pt reports checked blood sugar and was > 500. Meter does not read over 500.  Takes her insulin.  Took this AM.  Did not take her lunch dose.  Ate bagel earlier.    Pt ambulatory, NAD. On phone. Oriented.    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  discussed with dr Lenard Lance. Orders placed.

## 2019-06-25 NOTE — ED Notes (Signed)
See triage note  Presents with elevated blood sugar   States she noticed that her sugar was over 500 PTA

## 2019-07-11 DIAGNOSIS — E782 Mixed hyperlipidemia: Secondary | ICD-10-CM | POA: Insufficient documentation

## 2019-08-03 DIAGNOSIS — K219 Gastro-esophageal reflux disease without esophagitis: Secondary | ICD-10-CM | POA: Insufficient documentation

## 2019-08-03 DIAGNOSIS — F319 Bipolar disorder, unspecified: Secondary | ICD-10-CM | POA: Insufficient documentation

## 2019-08-24 ENCOUNTER — Other Ambulatory Visit: Payer: Self-pay | Admitting: Internal Medicine

## 2019-08-24 DIAGNOSIS — Z1231 Encounter for screening mammogram for malignant neoplasm of breast: Secondary | ICD-10-CM

## 2019-08-24 DIAGNOSIS — K589 Irritable bowel syndrome without diarrhea: Secondary | ICD-10-CM | POA: Insufficient documentation

## 2019-09-07 ENCOUNTER — Ambulatory Visit
Admission: RE | Admit: 2019-09-07 | Discharge: 2019-09-07 | Disposition: A | Discharge status: Home / Self Care | Payer: BLUE CROSS/BLUE SHIELD | Source: Ambulatory Visit | Attending: Internal Medicine | Admitting: Internal Medicine

## 2019-09-07 DIAGNOSIS — Z1231 Encounter for screening mammogram for malignant neoplasm of breast: Secondary | ICD-10-CM | POA: Diagnosis present

## 2019-09-23 ENCOUNTER — Ambulatory Visit
Admission: RE | Admit: 2019-09-23 | Discharge: 2019-09-23 | Disposition: A | Discharge status: Home / Self Care | Payer: BLUE CROSS/BLUE SHIELD | Source: Ambulatory Visit | Attending: Family | Admitting: Family

## 2019-09-23 ENCOUNTER — Other Ambulatory Visit: Payer: Self-pay | Admitting: Family

## 2019-09-23 ENCOUNTER — Other Ambulatory Visit: Payer: Self-pay

## 2019-09-23 DIAGNOSIS — R928 Other abnormal and inconclusive findings on diagnostic imaging of breast: Secondary | ICD-10-CM

## 2019-09-23 DIAGNOSIS — N6310 Unspecified lump in the right breast, unspecified quadrant: Secondary | ICD-10-CM | POA: Diagnosis present

## 2019-09-23 DIAGNOSIS — N6489 Other specified disorders of breast: Secondary | ICD-10-CM

## 2019-09-28 ENCOUNTER — Other Ambulatory Visit: Payer: Self-pay | Admitting: Family

## 2019-09-28 DIAGNOSIS — R928 Other abnormal and inconclusive findings on diagnostic imaging of breast: Secondary | ICD-10-CM

## 2019-09-28 DIAGNOSIS — N6310 Unspecified lump in the right breast, unspecified quadrant: Secondary | ICD-10-CM

## 2019-09-30 ENCOUNTER — Ambulatory Visit
Admission: RE | Admit: 2019-09-30 | Discharge: 2019-09-30 | Disposition: A | Discharge status: Home / Self Care | Payer: BLUE CROSS/BLUE SHIELD | Source: Ambulatory Visit | Attending: Family | Admitting: Family

## 2019-09-30 ENCOUNTER — Other Ambulatory Visit: Payer: Self-pay

## 2019-09-30 DIAGNOSIS — R928 Other abnormal and inconclusive findings on diagnostic imaging of breast: Secondary | ICD-10-CM | POA: Diagnosis present

## 2019-09-30 DIAGNOSIS — N6310 Unspecified lump in the right breast, unspecified quadrant: Secondary | ICD-10-CM

## 2019-09-30 HISTORY — PX: BREAST BIOPSY: SHX20

## 2019-10-01 LAB — SURGICAL PATHOLOGY

## 2019-10-26 DIAGNOSIS — M810 Age-related osteoporosis without current pathological fracture: Secondary | ICD-10-CM | POA: Insufficient documentation

## 2019-11-11 ENCOUNTER — Emergency Department
Admission: EM | Admit: 2019-11-11 | Discharge: 2019-11-11 | Disposition: A | Discharge status: Home / Self Care | Payer: BLUE CROSS/BLUE SHIELD | Attending: Emergency Medicine | Admitting: Emergency Medicine

## 2019-11-11 ENCOUNTER — Other Ambulatory Visit: Payer: Self-pay

## 2019-11-11 DIAGNOSIS — R739 Hyperglycemia, unspecified: Secondary | ICD-10-CM | POA: Diagnosis present

## 2019-11-11 DIAGNOSIS — Z794 Long term (current) use of insulin: Secondary | ICD-10-CM | POA: Insufficient documentation

## 2019-11-11 DIAGNOSIS — E1165 Type 2 diabetes mellitus with hyperglycemia: Secondary | ICD-10-CM | POA: Insufficient documentation

## 2019-11-11 DIAGNOSIS — Z79899 Other long term (current) drug therapy: Secondary | ICD-10-CM | POA: Insufficient documentation

## 2019-11-11 LAB — CBC
HCT: 42.9 % (ref 36.0–46.0)
Hemoglobin: 14.8 g/dL (ref 12.0–15.0)
MCH: 28.2 pg (ref 26.0–34.0)
MCHC: 34.5 g/dL (ref 30.0–36.0)
MCV: 81.9 fL (ref 80.0–100.0)
Platelets: 202 10*3/uL (ref 150–400)
RBC: 5.24 MIL/uL — ABNORMAL HIGH (ref 3.87–5.11)
RDW: 12.6 % (ref 11.5–15.5)
WBC: 8.9 10*3/uL (ref 4.0–10.5)
nRBC: 0 % (ref 0.0–0.2)

## 2019-11-11 LAB — GLUCOSE, CAPILLARY
Glucose-Capillary: 406 mg/dL — ABNORMAL HIGH (ref 70–99)
Glucose-Capillary: 254 mg/dL — ABNORMAL HIGH (ref 70–99)

## 2019-11-11 LAB — URINALYSIS, COMPLETE (UACMP) WITH MICROSCOPIC
Bacteria, UA: NONE SEEN
Bilirubin Urine: NEGATIVE
Glucose, UA: >=500 mg/dL — AB
Hgb urine dipstick: NEGATIVE
Ketones, ur: NEGATIVE mg/dL
Leukocytes,Ua: NEGATIVE
Nitrite: NEGATIVE
Protein, ur: NEGATIVE mg/dL
Specific Gravity, Urine: 1.008 (ref 1.005–1.030)
pH: 6 (ref 5.0–8.0)

## 2019-11-11 LAB — BASIC METABOLIC PANEL
Anion gap: 9 (ref 5–15)
BUN: 16 mg/dL (ref 8–23)
CO2: 22 mmol/L (ref 22–32)
Calcium: 9.3 mg/dL (ref 8.9–10.3)
Chloride: 103 mmol/L (ref 98–111)
Creatinine, Ser: 1.29 mg/dL — ABNORMAL HIGH (ref 0.44–1.00)
GFR calc Af Amer: 51 mL/min — ABNORMAL LOW (ref >60)
GFR calc non Af Amer: 44 mL/min — ABNORMAL LOW (ref >60)
Glucose, Bld: 408 mg/dL — ABNORMAL HIGH (ref 70–99)
Potassium: 4.8 mmol/L (ref 3.5–5.1)
Sodium: 134 mmol/L — ABNORMAL LOW (ref 135–145)

## 2019-11-11 MED ORDER — SODIUM CHLORIDE 0.9 % IV BOLUS
1000.0000 mL | Freq: Once | INTRAVENOUS | Status: AC
  Administered 2019-11-11: 1000 mL via INTRAVENOUS

## 2019-11-11 MED ORDER — INSULIN ASPART 100 UNIT/ML ~~LOC~~ SOLN
0.0000 [IU] | SUBCUTANEOUS | Status: DC
  Administered 2019-11-11: 8 [IU] via SUBCUTANEOUS
  Filled 2019-11-11: qty 1

## 2019-11-11 NOTE — ED Provider Notes (Signed)
[facility] Emergency Department Provider Note  ____________________________________________   First MD Initiated Contact with Patient 11/11/19 1722     (approximate)  I have reviewed the triage vital signs and the nursing notes.   HISTORY  Chief Complaint Hyperglycemia   HPI Marilyn Davis is a 64 y.o. female with a past medical history of bipolar disorder, sciatica, and insulin-dependent DM who presents for assessment of some dizziness she developed after eating sushi for lunch in the setting of elevated blood sugars.  Patient states her sugars are normally never in the 400s but that they were in the 400s after she gave herself her usual dose of insulin but her sugars continue to rise.  She states she currently feels much better than she did and denies any current dizziness, headache, nausea, shortness of breath, chest pain, back pain, earache, sore throat, burning with urination, or any other acute symptoms.  No rashes or recent injuries.  No clear alleviating or rating factors.  No prior similar episodes.  Patient denies EtOH use, illegal drug use, or tobacco abuse.         Past Medical History:  Diagnosis Date  . Bipolar disorder (HCC)   . Diabetes mellitus without complication (HCC)   . Sciatica     Patient Active Problem List   Diagnosis Date Noted  . Diabetes (HCC) 03/15/2019  . Chronic pain syndrome 03/15/2019  . Dyslipidemia 03/15/2019  . Bipolar 1 disorder, depressed, severe (HCC) 03/14/2019    Past Surgical History:  Procedure Laterality Date  . ABDOMINAL HYSTERECTOMY    . BREAST BIOPSY Right 09/30/2019   9:00, 5cmfn, Q shape, path pending  . BREAST BIOPSY Right 09/30/2019   9:00, 9cmfn, vision, path pending  . EXPLORATORY LAPAROTOMY    . SHOULDER SURGERY    . TONSILLECTOMY      Prior to Admission medications   Medication Sig Start Date End Date Taking? Authorizing Provider  atorvastatin (LIPITOR) 10 MG tablet Take 1  tablet (10 mg total) by mouth daily at 6 PM. 03/16/19   Money, Gerlene Burdock, FNP  buPROPion (WELLBUTRIN XL) 150 MG 24 hr tablet Take 1 tablet (150 mg total) by mouth daily. 03/16/19   Money, Gerlene Burdock, FNP  famotidine (PEPCID) 20 MG tablet Take 1 tablet (20 mg total) by mouth 2 (two) times daily. 03/16/19   Money, Gerlene Burdock, FNP  fenofibrate 160 MG tablet Take 1 tablet (160 mg total) by mouth daily. 03/16/19   Money, Gerlene Burdock, FNP  insulin aspart (NOVOLOG) 100 UNIT/ML injection Inject 4 Units into the skin 3 (three) times daily with meals. 03/15/19   Clapacs, Jackquline Denmark, MD  insulin glargine (LANTUS) 100 UNIT/ML Solostar Pen Inject 32 Units into the skin at bedtime. 06/25/19   Cuthriell, Delorise Royals, PA-C  lamoTRIgine (LAMICTAL) 200 MG tablet Take 1 tablet (200 mg total) by mouth at bedtime. 03/16/19   Money, Gerlene Burdock, FNP  lubiprostone (AMITIZA) 8 MCG capsule Take 1 capsule (8 mcg total) by mouth daily with breakfast. 03/16/19   Money, Gerlene Burdock, FNP  mirtazapine (REMERON) 15 MG tablet Take 1 tablet (15 mg total) by mouth at bedtime. 03/16/19   Money, Gerlene Burdock, FNP  montelukast (SINGULAIR) 10 MG tablet Take 1 tablet (10 mg total) by mouth at bedtime. 03/16/19   Money, Gerlene Burdock, FNP  OLANZapine (ZYPREXA) 10 MG tablet Take 0.5 tablets (5 mg total) by mouth at bedtime. 03/16/19   Money, Gerlene Burdock, FNP  QUEtiapine (SEROQUEL) 25 MG  tablet Take 1 tablet (25 mg total) by mouth 3 (three) times daily. 03/16/19   Money, Gerlene Burdockravis B, FNP  traMADol (ULTRAM) 50 MG tablet Take 50-100 mg by mouth every 4 (four) hours as needed for moderate pain or severe pain.  02/26/19   [provider]  traZODone (DESYREL) 100 MG tablet Take 2.5 tablets (250 mg total) by mouth at bedtime. 03/16/19   Money, Gerlene Burdockravis B, FNP  zolpidem (AMBIEN) 5 MG tablet Take 5 mg by mouth at bedtime as needed for sleep.    [provider]    Allergies Sudafed [pseudoephedrine hcl]  Family History  Problem Relation Age of Onset  . Breast cancer Neg Hx      Social History Social History   Tobacco Use  . Smoking status: Never Smoker  . Smokeless tobacco: Never Used  Vaping Use  . Vaping Use: Never used  Substance Use Topics  . Alcohol use: Never  . Drug use: Never    Review of Systems  Review of Systems  Constitutional: Negative for chills and fever.  HENT: Negative for sore throat.   Eyes: Negative for pain.  Respiratory: Negative for cough and stridor.   Cardiovascular: Negative for chest pain.  Gastrointestinal: Negative for vomiting.  Genitourinary: Negative for dysuria.  Musculoskeletal: Negative for myalgias.  Skin: Negative for rash.  Neurological: Positive for dizziness. Negative for seizures, loss of consciousness and headaches.  Psychiatric/Behavioral: Negative for suicidal ideas.  All other systems reviewed and are negative.     ____________________________________________   PHYSICAL EXAM:  VITAL SIGNS: ED Triage Vitals [11/11/19 1355]  Enc Vitals Group     BP 134/60     Pulse Rate 97     Resp 18     Temp 98.5 F (36.9 C)     Temp Source Oral     SpO2 98 %     Weight 214 lb (97.1 kg)     Height 5\' 5"  (1.651 m)     Head Circumference      Peak Flow      Pain Score 0     Pain Loc      Pain Edu?      Excl. in GC?    Vitals:   11/11/19 1355 11/11/19 1552  BP: 134/60 132/61  Pulse: 97 87  Resp: 18 16  Temp: 98.5 F (36.9 C)   SpO2: 98% 97%   Physical Exam Vitals and nursing note reviewed.  Constitutional:      General: She is not in acute distress.    Appearance: She is well-developed.  HENT:     Head: Normocephalic and atraumatic.     Right Ear: External ear normal.     Left Ear: External ear normal.     Nose: Nose normal.     Mouth/Throat:     Mouth: Mucous membranes are dry.  Eyes:     Conjunctiva/sclera: Conjunctivae normal.  Cardiovascular:     Rate and Rhythm: Normal rate and regular rhythm.     Heart sounds: No murmur heard.   Pulmonary:     Effort: Pulmonary effort is  normal. No respiratory distress.     Breath sounds: Normal breath sounds.  Abdominal:     Palpations: Abdomen is soft.     Tenderness: There is no abdominal tenderness.  Musculoskeletal:     Cervical back: Neck supple. No rigidity.  Skin:    General: Skin is warm and dry.  Neurological:     Mental Status: She  is alert and oriented to person, place, and time.     Cranial Nerves: No cranial nerve deficit.     Sensory: No sensory deficit.     Motor: No weakness.  Psychiatric:        Mood and Affect: Mood normal.      ____________________________________________   LABS (all labs ordered are listed, but only abnormal results are displayed)  Labs Reviewed  GLUCOSE, CAPILLARY - Abnormal; Notable for the following components:      Result Value   Glucose-Capillary 406 (*)    All other components within normal limits  BASIC METABOLIC PANEL - Abnormal; Notable for the following components:   Sodium 134 (*)    Glucose, Bld 408 (*)    Creatinine, Ser 1.29 (*)    GFR calc non Af Amer 44 (*)    GFR calc Af Amer 51 (*)    All other components within normal limits  CBC - Abnormal; Notable for the following components:   RBC 5.24 (*)    All other components within normal limits  URINALYSIS, COMPLETE (UACMP) WITH MICROSCOPIC - Abnormal; Notable for the following components:   Color, Urine STRAW (*)    APPearance CLEAR (*)    Glucose, UA >=500 (*)    All other components within normal limits  GLUCOSE, CAPILLARY - Abnormal; Notable for the following components:   Glucose-Capillary 254 (*)    All other components within normal limits  CBG MONITORING, ED  CBG MONITORING, ED   ____________________________________________  EKG  Sinus rhythm with a ventricular rate of 75, normal axis, unremarkable intervals, nonspecific T wave change in the anterior leads with no other clear evidence of acute ischemia or other significant underlying  arrhythmia. ____________________________________________  ____________________________________________   PROCEDURES  Procedure(s) performed (including Critical Care):  Procedures   ____________________________________________   INITIAL IMPRESSION / ASSESSMENT AND PLAN / ED COURSE        Patient presents with [place] to history exam for assessment of hyperglycemia that did not seem to respond as expected after taking her usual insulin after lunch today.  Patient is afebrile hemodynamically stable.  She reports was initially dizzy but this resolved when she got IV fluids in the lobby.  Exam as above.  Given absence of any chest pain, shortness of breath, and resolution of dizziness with no clear ischemia on ECG low suspicion for ACS.  No historical or exam factors to suggest CVA.  BMP obtained shows hyperglycemia with glucose of 408 with no evidence of acidosis and otherwise unremarkable electrolytes.  CBC is unremarkable.  UA shows glucose but no evidence of ketones or infection.  Overall impression is likely mild dehydration in the setting of hyperglycemia not associated with DKA or HHS.  Low suspicion for sepsis, toxic ingestion, or other immediate life-threatening pathology.  Patient given sliding scale glucose on recheck it was noted to be 254.  She also received 1 L of IV fluids.  Given stable vital signs with reassuring work-up and improvement in glucose I wishes a for discharge with plan for outpatient follow-up.  Discharge stable condition.  Strict return precautions advised and discussed.         ____________________________________________   FINAL CLINICAL IMPRESSION(S) / ED DIAGNOSES  Final diagnoses:  Hyperglycemia    Medications  insulin aspart (novoLOG) injection 0-15 Units (8 Units Subcutaneous Given 11/11/19 1747)  sodium chloride 0.9 % bolus 1,000 mL (0 mLs Intravenous Stopped 11/11/19 1741)     ED Discharge Orders  None       Note:  This document was  prepared using Dragon voice recognition software and may include unintentional dictation errors.   Gilles Chiquito, MD 11/11/19 [phone]

## 2019-11-11 NOTE — ED Triage Notes (Signed)
Pt comes POV with hyperglycemia. Pt ate lunch and her cbg jumped up to 406. Is insulin dependent but did not give her self any insulin to correct.

## 2020-01-08 ENCOUNTER — Encounter: Payer: Self-pay | Admitting: Emergency Medicine

## 2020-01-08 ENCOUNTER — Emergency Department: Payer: BLUE CROSS/BLUE SHIELD

## 2020-01-08 ENCOUNTER — Other Ambulatory Visit: Payer: Self-pay

## 2020-01-08 ENCOUNTER — Emergency Department
Admission: EM | Admit: 2020-01-08 | Discharge: 2020-01-08 | Disposition: A | Discharge status: Home / Self Care | Payer: BLUE CROSS/BLUE SHIELD | Attending: Emergency Medicine | Admitting: Emergency Medicine

## 2020-01-08 DIAGNOSIS — Z794 Long term (current) use of insulin: Secondary | ICD-10-CM | POA: Diagnosis not present

## 2020-01-08 DIAGNOSIS — E119 Type 2 diabetes mellitus without complications: Secondary | ICD-10-CM | POA: Insufficient documentation

## 2020-01-08 DIAGNOSIS — R079 Chest pain, unspecified: Secondary | ICD-10-CM

## 2020-01-08 LAB — CBC
HCT: 45.7 % (ref 36.0–46.0)
Hemoglobin: 15.1 g/dL — ABNORMAL HIGH (ref 12.0–15.0)
MCH: 27.5 pg (ref 26.0–34.0)
MCHC: 33 g/dL (ref 30.0–36.0)
MCV: 83.2 fL (ref 80.0–100.0)
Platelets: 221 10*3/uL (ref 150–400)
RBC: 5.49 MIL/uL — ABNORMAL HIGH (ref 3.87–5.11)
RDW: 12.8 % (ref 11.5–15.5)
WBC: 8.6 10*3/uL (ref 4.0–10.5)
nRBC: 0 % (ref 0.0–0.2)

## 2020-01-08 LAB — TROPONIN I (HIGH SENSITIVITY)
Troponin I (High Sensitivity): 3 ng/L (ref <18)
Troponin I (High Sensitivity): 3 ng/L (ref <18)

## 2020-01-08 LAB — BASIC METABOLIC PANEL
Anion gap: 9 (ref 5–15)
BUN: 19 mg/dL (ref 8–23)
CO2: 20 mmol/L — ABNORMAL LOW (ref 22–32)
Calcium: 9.6 mg/dL (ref 8.9–10.3)
Chloride: 105 mmol/L (ref 98–111)
Creatinine, Ser: 1.04 mg/dL — ABNORMAL HIGH (ref 0.44–1.00)
GFR, Estimated: >60 mL/min (ref >60)
Glucose, Bld: 244 mg/dL — ABNORMAL HIGH (ref 70–99)
Potassium: 4.6 mmol/L (ref 3.5–5.1)
Sodium: 134 mmol/L — ABNORMAL LOW (ref 135–145)

## 2020-01-08 NOTE — ED Notes (Signed)
Patient to ED for chest pain and back pain. She thinks it's Pleurisy but her "husband wanted her to be checked".

## 2020-01-08 NOTE — ED Notes (Signed)
ED Provider at bedside. 

## 2020-01-08 NOTE — ED Provider Notes (Signed)
[facility] Emergency Department Provider Note  ____________________________________________  Time seen: Approximately 12:20 PM  I have reviewed the triage vital signs and the nursing notes.   HISTORY  Chief Complaint Chest Pain    HPI Marilyn Davis is a 64 y.o. female who presents to the emergency department complaining of centralized chest pain radiating to her back.  Patient states that she has a history of pleurisy, states that the symptoms were consistent in regards to location and pain type, just worse than normal.  Patient states that at first it was somewhat "strong."  She states that she was sitting on the couch watching TV, felt the pain.  She mentioned it to her husband who wanted her to come to "be checked out."  Patient states that the pain has continued, but is much more mild than when it started.  She has having no associated shortness of breath.  Pain does radiate from the center of the chest to the back but does not radiate into the jaw or down the left arm.  She states that she has a history of mitral valve prolapse, but has never had a heart attack or other heart complications.  No history of surgical procedures with her heart.  Patient denies any recent URI symptoms.  No cough.  No shortness of breath.  No peripheral edema.  Patient with a history of bipolar disorder, diabetes, sciatica.  No complaints of chronic medical issues.  No medications for this complaint prior to arrival.         Past Medical History:  Diagnosis Date  . Bipolar disorder (HCC)   . Diabetes mellitus without complication (HCC)   . Sciatica     Patient Active Problem List   Diagnosis Date Noted  . Diabetes (HCC) 03/15/2019  . Chronic pain syndrome 03/15/2019  . Dyslipidemia 03/15/2019  . Bipolar 1 disorder, depressed, severe (HCC) 03/14/2019    Past Surgical History:  Procedure Laterality Date  . ABDOMINAL HYSTERECTOMY    . BREAST BIOPSY Right 09/30/2019    9:00, 5cmfn, Q shape, path pending  . BREAST BIOPSY Right 09/30/2019   9:00, 9cmfn, vision, path pending  . EXPLORATORY LAPAROTOMY    . SHOULDER SURGERY    . TONSILLECTOMY      Prior to Admission medications   Medication Sig Start Date End Date Taking? Authorizing Provider  atorvastatin (LIPITOR) 10 MG tablet Take 1 tablet (10 mg total) by mouth daily at 6 PM. 03/16/19   Money, Gerlene Burdockravis B, FNP  buPROPion (WELLBUTRIN XL) 150 MG 24 hr tablet Take 1 tablet (150 mg total) by mouth daily. 03/16/19   Money, Gerlene Burdockravis B, FNP  famotidine (PEPCID) 20 MG tablet Take 1 tablet (20 mg total) by mouth 2 (two) times daily. 03/16/19   Money, Gerlene Burdockravis B, FNP  fenofibrate 160 MG tablet Take 1 tablet (160 mg total) by mouth daily. 03/16/19   Money, Gerlene Burdockravis B, FNP  insulin aspart (NOVOLOG) 100 UNIT/ML injection Inject 4 Units into the skin 3 (three) times daily with meals. 03/15/19   Clapacs, Jackquline DenmarkJohn T, MD  insulin glargine (LANTUS) 100 UNIT/ML Solostar Pen Inject 32 Units into the skin at bedtime. 06/25/19   Tashawn Greff, Delorise RoyalsJonathan D, PA-C  lamoTRIgine (LAMICTAL) 200 MG tablet Take 1 tablet (200 mg total) by mouth at bedtime. 03/16/19   Money, Gerlene Burdockravis B, FNP  lubiprostone (AMITIZA) 8 MCG capsule Take 1 capsule (8 mcg total) by mouth daily with breakfast. 03/16/19   Money, Gerlene Burdockravis B, FNP  mirtazapine (  REMERON) 15 MG tablet Take 1 tablet (15 mg total) by mouth at bedtime. 03/16/19   Money, Gerlene Burdock, FNP  montelukast (SINGULAIR) 10 MG tablet Take 1 tablet (10 mg total) by mouth at bedtime. 03/16/19   Money, Gerlene Burdock, FNP  OLANZapine (ZYPREXA) 10 MG tablet Take 0.5 tablets (5 mg total) by mouth at bedtime. 03/16/19   Money, Gerlene Burdock, FNP  QUEtiapine (SEROQUEL) 25 MG tablet Take 1 tablet (25 mg total) by mouth 3 (three) times daily. 03/16/19   Money, Gerlene Burdock, FNP  traMADol (ULTRAM) 50 MG tablet Take 50-100 mg by mouth every 4 (four) hours as needed for moderate pain or severe pain.  02/26/19   [provider]  traZODone (DESYREL) 100  MG tablet Take 2.5 tablets (250 mg total) by mouth at bedtime. 03/16/19   Money, Gerlene Burdock, FNP  zolpidem (AMBIEN) 5 MG tablet Take 5 mg by mouth at bedtime as needed for sleep.    [provider]    Allergies Pseudoephedrine hcl, Cabbage, and Ibuprofen  Family History  Problem Relation Age of Onset  . Breast cancer Neg Hx     Social History Social History   Tobacco Use  . Smoking status: Never Smoker  . Smokeless tobacco: Never Used  Vaping Use  . Vaping Use: Never used  Substance Use Topics  . Alcohol use: Never  . Drug use: Never     Review of Systems  Constitutional: No fever/chills Eyes: No visual changes. No discharge ENT: No upper respiratory complaints. Cardiovascular: Chest pain in the center of the chest to the back.  No radiation into the jaw or arm. Respiratory: no cough. No SOB. Gastrointestinal: No abdominal pain.  No nausea, no vomiting.  No diarrhea.  No constipation. Genitourinary: Negative for dysuria. No hematuria Musculoskeletal: Negative for musculoskeletal pain. Skin: Negative for rash, abrasions, lacerations, ecchymosis. Neurological: Negative for headaches, focal weakness or numbness.  10 System ROS otherwise negative.  ____________________________________________   PHYSICAL EXAM:  VITAL SIGNS: ED Triage Vitals  Enc Vitals Group     BP 01/08/20 1044 136/77     Pulse Rate 01/08/20 1044 (!) 103     Resp 01/08/20 1044 17     Temp 01/08/20 1044 98.2 F (36.8 C)     Temp Source 01/08/20 1044 Oral     SpO2 01/08/20 1044 96 %     Weight 01/08/20 1054 208 lb (94.3 kg)     Height 01/08/20 1054 5\' 5"  (1.651 m)     Head Circumference --      Peak Flow --      Pain Score 01/08/20 1053 4     Pain Loc --      Pain Edu? --      Excl. in GC? --      Constitutional: Alert and oriented. Well appearing and in no acute distress. Eyes: Conjunctivae are normal. PERRL. EOMI. Head: Atraumatic. ENT:      Ears:       Nose: No  congestion/rhinnorhea.      Mouth/Throat: Mucous membranes are moist.  Neck: No stridor.   Hematological/Lymphatic/Immunilogical: No cervical lymphadenopathy. Cardiovascular: Normal rate, regular rhythm. Normal S1 and S2.  No appreciable murmur, rub, gallop.  Good peripheral circulation. Respiratory: Normal respiratory effort without tachypnea or retractions. Lungs CTAB. Good air entry to the bases with no decreased or absent breath sounds. Gastrointestinal: Bowel sounds 4 quadrants. Soft and nontender to palpation. No guarding or rigidity. No palpable masses. No distention. Musculoskeletal: Full  range of motion to all extremities. No gross deformities appreciated. Neurologic:  Normal speech and language. No gross focal neurologic deficits are appreciated.  Skin:  Skin is warm, dry and intact. No rash noted. Psychiatric: Mood and affect are normal. Speech and behavior are normal. Patient exhibits appropriate insight and judgement.   ____________________________________________   LABS (all labs ordered are listed, but only abnormal results are displayed)  Labs Reviewed  BASIC METABOLIC PANEL - Abnormal; Notable for the following components:      Result Value   Sodium 134 (*)    CO2 20 (*)    Glucose, Bld 244 (*)    Creatinine, Ser 1.04 (*)    All other components within normal limits  CBC - Abnormal; Notable for the following components:   RBC 5.49 (*)    Hemoglobin 15.1 (*)    All other components within normal limits  TROPONIN I (HIGH SENSITIVITY)  TROPONIN I (HIGH SENSITIVITY)   ____________________________________________  EKG  ED ECG REPORT I, Delorise Royals Jahden Schara,  personally viewed and interpreted this ECG.   Date: 01/08/2020  EKG Time: 1058 hrs.  Rate: 104 bpm  Rhythm: unchanged from previous tracings, sinus tachycardia, nonspecific T wave changes  Axis: Normal axis  Intervals:none  ST&T Change: No ST elevation or depression noted  Sinus tachycardia.  No STEMI.   Nonspecific T wave changes.  Consistent with previous EKG from 11/11/2019  ____________________________________________  RADIOLOGY I personally viewed and evaluated these images as part of my medical decision making, as well as reviewing the written report by the radiologist.  ED Provider Interpretation:   DG Chest 2 View  Result Date: 01/08/2020 CLINICAL DATA:  Chest pain EXAM: CHEST - 2 VIEW COMPARISON:  None. FINDINGS: Normal heart size and vascularity. Minor streaky basilar densities favored to be atelectasis. No acute pneumonia, collapse or consolidation. Negative for edema, effusion or pneumothorax. Trachea midline. Degenerative changes of the spine. IMPRESSION: Bibasilar atelectasis.  No other acute process by plain radiography. Electronically Signed   By: Judie Petit.  Shick M.D.   On: 01/08/2020 12:03    ____________________________________________    PROCEDURES  Procedure(s) performed:    Procedures    Medications - No data to display   ____________________________________________   INITIAL IMPRESSION / ASSESSMENT AND PLAN / ED COURSE  Pertinent labs & imaging results that were available during my care of the patient were reviewed by me and considered in my medical decision making (see chart for details).  Review of the [place] CSRS was performed in accordance of the [facility] prior to dispensing any controlled drugs.  Clinical Course as of Jan 07 1350  Sat Jan 08, 2020  1231 Patient presented to emergency department complaining of chest pain radiating to her back.  She states that it started off somewhat strong and has improved.  It is still currently there.  There is no aggravating or alleviating factors with movement, inspiration, palpation.  Patient denies any cardiac history other than mitral valve prolapse which has not been surgically repaired.  Patient states that she does have a history of pleurisy and feels that symptoms are consistent with same though it did start stronger  than normal.  Overall exam is reassuring.  Patient does have a reported heart murmur with her mitral valve prolapse but I am unable to appreciate that on exam.  No adventitious lung sounds.  Pain is not pleuritic in nature.  Patient did have sinus tachycardia at 104 on EKG but in the patient's room she is  in normal sinus rhythm with a resting heart rate of 84 on my exam.  Labs at this time are reassuring.  Initial troponin is 3.  EKG reveals nonspecific T wave changes which is consistent with previous EKG from 2 months ago.  Will repeat troponin and wait for results.  Chest x-ray with some bibasilar atelectasis with no acute cardiopulmonary abnormality.   [JC]    Clinical Course User Index [JC] Trey Bebee, Delorise Royals, PA-C          Patient's diagnosis is consistent with nonspecific chest pain.  Patient presented to emergency department complaining of pain to the center of her chest.  She has a history of pleurisy.  States that symptoms were similar, just slightly worse than normal.  Patient states that she did not have any alleviating or provoking factors.  This did not appear to be pleuritic.  Was not reproducible with palpation.  No recent trauma.  Patient states that the pain did begin worse than normal, is now at its normal baseline of a 1 or 2 out of 10.  Overall exam is reassuring.  Patient had reassuring EKG, troponin, chest x-ray and other labs.  Patient is unable to be PERC criteria cleared, however this is just given her age.  Patient's EKG showed sinus tachycardia at 104.  She states that she was nervous and essentially was in a room, she states that she felt much calmer and the pain had improved.  Patient was in normal sinus rhythm at a rate of 84 on my exam. At this time I have a very low suspicion for PE.  Differential included ACS/STEMI, PE, pneumonia, bronchitis, pleuritis..  This time exam is reassuring, patient is stable for discharge.  Follow-up with cardiology if symptoms persist.  If  symptoms change or worsen she can always return to the emergency department for further evaluation.  Patient is given ED precautions to return to the ED for any worsening or new symptoms.     ____________________________________________  FINAL CLINICAL IMPRESSION(S) / ED DIAGNOSES  Final diagnoses:  Nonspecific chest pain      NEW MEDICATIONS STARTED DURING THIS VISIT:  ED Discharge Orders    None          This chart was dictated using voice recognition software/Dragon. Despite best efforts to proofread, errors can occur which can change the meaning. Any change was purely unintentional.    Racheal Patches, PA-C 01/08/20 1351    Chesley Noon, MD 01/08/20 1357

## 2020-01-08 NOTE — ED Triage Notes (Signed)
Pt arrived via POV with reports of chest pain that started about 10-15 mins prior to arrival, pt states she was sitting down watching TV when sxs started.  PT c/o back pain also.  Pt denies cough.

## 2020-01-25 DIAGNOSIS — G43909 Migraine, unspecified, not intractable, without status migrainosus: Secondary | ICD-10-CM | POA: Insufficient documentation

## 2020-06-08 ENCOUNTER — Other Ambulatory Visit: Payer: Self-pay | Admitting: Family

## 2020-06-08 DIAGNOSIS — R413 Other amnesia: Secondary | ICD-10-CM

## 2020-06-08 DIAGNOSIS — R42 Dizziness and giddiness: Secondary | ICD-10-CM

## 2020-06-08 DIAGNOSIS — R5383 Other fatigue: Secondary | ICD-10-CM

## 2020-06-08 DIAGNOSIS — R531 Weakness: Secondary | ICD-10-CM

## 2020-06-16 ENCOUNTER — Ambulatory Visit
Admission: RE | Admit: 2020-06-16 | Discharge: 2020-06-16 | Disposition: A | Discharge status: Home / Self Care | Payer: 59 | Source: Ambulatory Visit | Attending: Family | Admitting: Family

## 2020-06-16 ENCOUNTER — Other Ambulatory Visit: Payer: Self-pay

## 2020-06-16 DIAGNOSIS — R413 Other amnesia: Secondary | ICD-10-CM | POA: Insufficient documentation

## 2020-06-16 DIAGNOSIS — R42 Dizziness and giddiness: Secondary | ICD-10-CM | POA: Diagnosis not present

## 2020-06-16 DIAGNOSIS — R5383 Other fatigue: Secondary | ICD-10-CM | POA: Diagnosis present

## 2020-06-16 DIAGNOSIS — R531 Weakness: Secondary | ICD-10-CM | POA: Diagnosis present

## 2020-09-13 DIAGNOSIS — R413 Other amnesia: Secondary | ICD-10-CM | POA: Insufficient documentation

## 2020-09-13 DIAGNOSIS — R251 Tremor, unspecified: Secondary | ICD-10-CM | POA: Insufficient documentation

## 2020-09-13 DIAGNOSIS — R42 Dizziness and giddiness: Secondary | ICD-10-CM | POA: Insufficient documentation

## 2020-10-24 ENCOUNTER — Ambulatory Visit: Payer: 59 | Attending: Neurology | Admitting: Physical Therapy

## 2020-11-08 ENCOUNTER — Encounter: Payer: 59 | Admitting: Physical Therapy

## 2020-11-14 ENCOUNTER — Encounter: Payer: 59 | Admitting: Physical Therapy

## 2020-11-21 ENCOUNTER — Encounter: Payer: 59 | Admitting: Physical Therapy

## 2020-11-28 ENCOUNTER — Encounter: Payer: 59 | Admitting: Physical Therapy

## 2020-12-05 ENCOUNTER — Encounter: Payer: 59 | Admitting: Physical Therapy

## 2020-12-12 ENCOUNTER — Encounter: Payer: 59 | Admitting: Physical Therapy

## 2020-12-19 ENCOUNTER — Encounter: Payer: 59 | Admitting: Physical Therapy

## 2021-03-02 DIAGNOSIS — K219 Gastro-esophageal reflux disease without esophagitis: Secondary | ICD-10-CM | POA: Diagnosis not present

## 2021-03-02 DIAGNOSIS — I10 Essential (primary) hypertension: Secondary | ICD-10-CM | POA: Diagnosis not present

## 2021-03-02 DIAGNOSIS — G43009 Migraine without aura, not intractable, without status migrainosus: Secondary | ICD-10-CM | POA: Diagnosis not present

## 2021-03-02 DIAGNOSIS — E669 Obesity, unspecified: Secondary | ICD-10-CM | POA: Diagnosis not present

## 2021-03-02 DIAGNOSIS — D519 Vitamin B12 deficiency anemia, unspecified: Secondary | ICD-10-CM | POA: Diagnosis not present

## 2021-03-02 DIAGNOSIS — K588 Other irritable bowel syndrome: Secondary | ICD-10-CM | POA: Diagnosis not present

## 2021-03-02 DIAGNOSIS — E1121 Type 2 diabetes mellitus with diabetic nephropathy: Secondary | ICD-10-CM | POA: Diagnosis not present

## 2021-03-02 DIAGNOSIS — E782 Mixed hyperlipidemia: Secondary | ICD-10-CM | POA: Diagnosis not present

## 2021-03-26 DIAGNOSIS — R251 Tremor, unspecified: Secondary | ICD-10-CM | POA: Diagnosis not present

## 2021-03-26 DIAGNOSIS — R413 Other amnesia: Secondary | ICD-10-CM | POA: Diagnosis not present

## 2021-03-26 DIAGNOSIS — I10 Essential (primary) hypertension: Secondary | ICD-10-CM | POA: Diagnosis not present

## 2021-03-26 DIAGNOSIS — D519 Vitamin B12 deficiency anemia, unspecified: Secondary | ICD-10-CM | POA: Diagnosis not present

## 2021-03-26 DIAGNOSIS — E782 Mixed hyperlipidemia: Secondary | ICD-10-CM | POA: Diagnosis not present

## 2021-03-26 DIAGNOSIS — R42 Dizziness and giddiness: Secondary | ICD-10-CM | POA: Diagnosis not present

## 2021-03-26 DIAGNOSIS — K219 Gastro-esophageal reflux disease without esophagitis: Secondary | ICD-10-CM | POA: Diagnosis not present

## 2021-03-26 DIAGNOSIS — E1121 Type 2 diabetes mellitus with diabetic nephropathy: Secondary | ICD-10-CM | POA: Diagnosis not present

## 2021-04-24 ENCOUNTER — Ambulatory Visit: Payer: Self-pay | Admitting: Podiatry

## 2021-04-24 ENCOUNTER — Other Ambulatory Visit: Payer: Self-pay

## 2021-04-24 ENCOUNTER — Ambulatory Visit: Payer: Medicare PPO | Admitting: Podiatry

## 2021-04-24 ENCOUNTER — Encounter: Payer: Self-pay | Admitting: Podiatry

## 2021-04-24 DIAGNOSIS — L600 Ingrowing nail: Secondary | ICD-10-CM | POA: Diagnosis not present

## 2021-04-24 NOTE — Progress Notes (Signed)
?Subjective:  ?Patient ID: Marilyn Davis, female    DOB: January 04, 1956,  MRN: [id number] ? ?Chief Complaint  ?Patient presents with  ? Nail Problem  ?  "I have an ingrown toenail on my right foot.  The left one is starting to get one too."  ? ? ?66 y.o. female presents with the above complaint.  Patient presents with bilateral medial border ingrown.  Patient states painful to touch.  She is diabetic with A1c of 5.7.  She states that painful to touch has progressive gotten worse right one is greater than left 1.  She would like to have them both removed.  She has not seen MRIs prior to seeing me.  Pain scale 7 out of 10 no infection noted ? ? ?Review of Systems: Negative except as noted in the HPI. Denies N/V/F/Ch. ? ?Past Medical History:  ?Diagnosis Date  ? Bipolar disorder (HCC)   ? Diabetes mellitus without complication (HCC)   ? Sciatica   ? ? ?Current Outpatient Medications:  ?  atorvastatin (LIPITOR) 10 MG tablet, Take 1 tablet (10 mg total) by mouth daily at 6 PM., Disp: 30 tablet, Rfl: 1 ?  buPROPion (WELLBUTRIN XL) 150 MG 24 hr tablet, Take 1 tablet (150 mg total) by mouth daily., Disp: 30 tablet, Rfl: 1 ?  famotidine (PEPCID) 20 MG tablet, Take 1 tablet (20 mg total) by mouth 2 (two) times daily., Disp: 60 tablet, Rfl: 1 ?  fenofibrate 160 MG tablet, Take 1 tablet (160 mg total) by mouth daily., Disp: 30 tablet, Rfl: 1 ?  insulin aspart (NOVOLOG) 100 UNIT/ML injection, Inject 4 Units into the skin 3 (three) times daily with meals., Disp: 10 mL, Rfl: 0 ?  insulin glargine (LANTUS) 100 UNIT/ML Solostar Pen, Inject 32 Units into the skin at bedtime., Disp: 15 mL, Rfl: 6 ?  lamoTRIgine (LAMICTAL) 200 MG tablet, Take 1 tablet (200 mg total) by mouth at bedtime., Disp: 30 tablet, Rfl: 1 ?  lubiprostone (AMITIZA) 8 MCG capsule, Take 1 capsule (8 mcg total) by mouth daily with breakfast., Disp: 30 capsule, Rfl: 1 ?  mirtazapine (REMERON) 15 MG tablet, Take 1 tablet (15 mg total) by mouth at bedtime., Disp: 30  tablet, Rfl: 1 ?  montelukast (SINGULAIR) 10 MG tablet, Take 1 tablet (10 mg total) by mouth at bedtime., Disp: 30 tablet, Rfl: 1 ?  OLANZapine (ZYPREXA) 10 MG tablet, Take 0.5 tablets (5 mg total) by mouth at bedtime., Disp: 30 tablet, Rfl: 1 ?  QUEtiapine (SEROQUEL) 25 MG tablet, Take 1 tablet (25 mg total) by mouth 3 (three) times daily., Disp: 90 tablet, Rfl: 1 ?  traMADol (ULTRAM) 50 MG tablet, Take 50-100 mg by mouth every 4 (four) hours as needed for moderate pain or severe pain. , Disp: , Rfl:  ?  traZODone (DESYREL) 100 MG tablet, Take 2.5 tablets (250 mg total) by mouth at bedtime., Disp: 75 tablet, Rfl: 1 ?  zolpidem (AMBIEN) 5 MG tablet, Take 5 mg by mouth at bedtime as needed for sleep., Disp: , Rfl:  ? ?Social History  ? ?Tobacco Use  ?Smoking Status Never  ?Smokeless Tobacco Never  ? ? ?Allergies  ?Allergen Reactions  ? Pseudoephedrine Hcl Other (See Comments)  ?  manic ?Other reaction(s): Hallucinations  ? Cabbage Nausea And Vomiting  ?  Cooked   ? Ibuprofen Itching  ? ?Objective:  ?There were no vitals filed for this visit. ?There is no height or weight on file to calculate BMI. ?Constitutional Well developed. ?  Well nourished.  ?Vascular Dorsalis pedis pulses palpable bilaterally. ?Posterior tibial pulses palpable bilaterally. ?Capillary refill normal to all digits.  ?No cyanosis or clubbing noted. ?Pedal hair growth normal.  ?Neurologic Normal speech. ?Oriented to person, place, and time. ?Epicritic sensation to light touch grossly present bilaterally.  ?Dermatologic Painful ingrowing nail at medial nail borders of the hallux nail bilaterally. ?No other open wounds. ?No skin lesions.  ?Orthopedic: Normal joint ROM without pain or crepitus bilaterally. ?No visible deformities. ?No bony tenderness.  ? ?Radiographs: None ?Assessment:  ? ?1. Ingrown toenail of right foot   ?2. Ingrown left big toenail   ? ?Plan:  ?Patient was evaluated and treated and all questions answered. ? ?Ingrown Nail,  bilaterally ?-Patient elects to proceed with minor surgery to remove ingrown toenail removal today. Consent reviewed and signed by patient. ?-Ingrown nail excised. See procedure note. ?-Educated on post-procedure care including soaking. Written instructions provided and reviewed. ?-Patient to follow up in 2 weeks for nail check. ? ?Procedure: Excision of Ingrown Toenail ?Location: Bilateral 1st toe medial nail borders. ?Anesthesia: Lidocaine 1% plain; 1.5 mL and Marcaine 0.5% plain; 1.5 mL, digital block. ?Skin Prep: Betadine. ?Dressing: Silvadene; telfa; dry, sterile, compression dressing. ?Technique: Following skin prep, the toe was exsanguinated and a tourniquet was secured at the base of the toe. The affected nail border was freed, split with a nail splitter, and excised. Chemical matrixectomy was then performed with phenol and irrigated out with alcohol. The tourniquet was then removed and sterile dressing applied. ?Disposition: Patient tolerated procedure well. Patient to return in 2 weeks for follow-up.  ? ?No follow-ups on file. ?

## 2021-04-30 DIAGNOSIS — G894 Chronic pain syndrome: Secondary | ICD-10-CM | POA: Diagnosis not present

## 2021-04-30 DIAGNOSIS — M5431 Sciatica, right side: Secondary | ICD-10-CM | POA: Diagnosis not present

## 2021-04-30 DIAGNOSIS — E089 Diabetes mellitus due to underlying condition without complications: Secondary | ICD-10-CM | POA: Diagnosis not present

## 2021-04-30 DIAGNOSIS — I10 Essential (primary) hypertension: Secondary | ICD-10-CM | POA: Diagnosis not present

## 2021-04-30 DIAGNOSIS — F1120 Opioid dependence, uncomplicated: Secondary | ICD-10-CM | POA: Diagnosis not present

## 2021-04-30 DIAGNOSIS — G8929 Other chronic pain: Secondary | ICD-10-CM | POA: Diagnosis not present

## 2021-04-30 DIAGNOSIS — Z79891 Long term (current) use of opiate analgesic: Secondary | ICD-10-CM | POA: Diagnosis not present

## 2021-04-30 DIAGNOSIS — M545 Low back pain, unspecified: Secondary | ICD-10-CM | POA: Diagnosis not present

## 2021-05-31 DIAGNOSIS — G43009 Migraine without aura, not intractable, without status migrainosus: Secondary | ICD-10-CM | POA: Diagnosis not present

## 2021-05-31 DIAGNOSIS — I10 Essential (primary) hypertension: Secondary | ICD-10-CM | POA: Diagnosis not present

## 2021-05-31 DIAGNOSIS — E1121 Type 2 diabetes mellitus with diabetic nephropathy: Secondary | ICD-10-CM | POA: Diagnosis not present

## 2021-05-31 DIAGNOSIS — E782 Mixed hyperlipidemia: Secondary | ICD-10-CM | POA: Diagnosis not present

## 2021-05-31 DIAGNOSIS — K219 Gastro-esophageal reflux disease without esophagitis: Secondary | ICD-10-CM | POA: Diagnosis not present

## 2021-06-25 DIAGNOSIS — M25551 Pain in right hip: Secondary | ICD-10-CM | POA: Diagnosis not present

## 2021-06-25 DIAGNOSIS — G894 Chronic pain syndrome: Secondary | ICD-10-CM | POA: Diagnosis not present

## 2021-06-25 DIAGNOSIS — F1120 Opioid dependence, uncomplicated: Secondary | ICD-10-CM | POA: Diagnosis not present

## 2021-06-25 DIAGNOSIS — F3131 Bipolar disorder, current episode depressed, mild: Secondary | ICD-10-CM | POA: Diagnosis not present

## 2021-06-25 DIAGNOSIS — I10 Essential (primary) hypertension: Secondary | ICD-10-CM | POA: Diagnosis not present

## 2021-06-25 DIAGNOSIS — M5441 Lumbago with sciatica, right side: Secondary | ICD-10-CM | POA: Diagnosis not present

## 2021-06-25 DIAGNOSIS — M5431 Sciatica, right side: Secondary | ICD-10-CM | POA: Diagnosis not present

## 2021-06-25 DIAGNOSIS — M5416 Radiculopathy, lumbar region: Secondary | ICD-10-CM | POA: Diagnosis not present

## 2021-06-25 DIAGNOSIS — E089 Diabetes mellitus due to underlying condition without complications: Secondary | ICD-10-CM | POA: Diagnosis not present

## 2021-06-29 DIAGNOSIS — E1121 Type 2 diabetes mellitus with diabetic nephropathy: Secondary | ICD-10-CM | POA: Diagnosis not present

## 2021-06-29 DIAGNOSIS — I10 Essential (primary) hypertension: Secondary | ICD-10-CM | POA: Diagnosis not present

## 2021-06-29 DIAGNOSIS — E782 Mixed hyperlipidemia: Secondary | ICD-10-CM | POA: Diagnosis not present

## 2021-07-27 ENCOUNTER — Emergency Department: Payer: Medicare PPO

## 2021-07-27 ENCOUNTER — Observation Stay
Admission: EM | Admit: 2021-07-27 | Discharge: 2021-07-28 | Disposition: A | Discharge status: Home Health | Payer: Medicare PPO | Attending: Internal Medicine | Admitting: Internal Medicine

## 2021-07-27 ENCOUNTER — Observation Stay: Payer: Medicare PPO

## 2021-07-27 ENCOUNTER — Other Ambulatory Visit: Payer: Self-pay

## 2021-07-27 DIAGNOSIS — E785 Hyperlipidemia, unspecified: Secondary | ICD-10-CM | POA: Diagnosis present

## 2021-07-27 DIAGNOSIS — E1065 Type 1 diabetes mellitus with hyperglycemia: Secondary | ICD-10-CM

## 2021-07-27 DIAGNOSIS — R42 Dizziness and giddiness: Secondary | ICD-10-CM | POA: Diagnosis not present

## 2021-07-27 DIAGNOSIS — N179 Acute kidney failure, unspecified: Secondary | ICD-10-CM | POA: Diagnosis not present

## 2021-07-27 DIAGNOSIS — Z79899 Other long term (current) drug therapy: Secondary | ICD-10-CM | POA: Diagnosis not present

## 2021-07-27 DIAGNOSIS — E860 Dehydration: Secondary | ICD-10-CM | POA: Diagnosis not present

## 2021-07-27 DIAGNOSIS — G894 Chronic pain syndrome: Secondary | ICD-10-CM

## 2021-07-27 DIAGNOSIS — N3000 Acute cystitis without hematuria: Secondary | ICD-10-CM | POA: Diagnosis not present

## 2021-07-27 DIAGNOSIS — N390 Urinary tract infection, site not specified: Secondary | ICD-10-CM

## 2021-07-27 DIAGNOSIS — F314 Bipolar disorder, current episode depressed, severe, without psychotic features: Secondary | ICD-10-CM | POA: Diagnosis present

## 2021-07-27 DIAGNOSIS — E1129 Type 2 diabetes mellitus with other diabetic kidney complication: Secondary | ICD-10-CM

## 2021-07-27 DIAGNOSIS — E119 Type 2 diabetes mellitus without complications: Secondary | ICD-10-CM

## 2021-07-27 DIAGNOSIS — Z794 Long term (current) use of insulin: Secondary | ICD-10-CM | POA: Insufficient documentation

## 2021-07-27 LAB — BASIC METABOLIC PANEL
Anion gap: 5 (ref 5–15)
BUN: 17 mg/dL (ref 8–23)
CO2: 22 mmol/L (ref 22–32)
Calcium: 9.8 mg/dL (ref 8.9–10.3)
Chloride: 109 mmol/L (ref 98–111)
Creatinine, Ser: 1.35 mg/dL — ABNORMAL HIGH (ref 0.44–1.00)
GFR, Estimated: 44 mL/min — ABNORMAL LOW (ref >60)
Glucose, Bld: 161 mg/dL — ABNORMAL HIGH (ref 70–99)
Potassium: 4.8 mmol/L (ref 3.5–5.1)
Sodium: 136 mmol/L (ref 135–145)

## 2021-07-27 LAB — URINALYSIS, ROUTINE W REFLEX MICROSCOPIC
Bilirubin Urine: NEGATIVE
Glucose, UA: NEGATIVE mg/dL
Hgb urine dipstick: NEGATIVE
Ketones, ur: 5 mg/dL — AB
Nitrite: NEGATIVE
Protein, ur: 100 mg/dL — AB
Specific Gravity, Urine: 1.017 (ref 1.005–1.030)
pH: 5 (ref 5.0–8.0)

## 2021-07-27 LAB — CBG MONITORING, ED
Glucose-Capillary: 219 mg/dL — ABNORMAL HIGH (ref 70–99)
Glucose-Capillary: 131 mg/dL — ABNORMAL HIGH (ref 70–99)

## 2021-07-27 LAB — CBC
HCT: 43.6 % (ref 36.0–46.0)
Hemoglobin: 14.1 g/dL (ref 12.0–15.0)
MCH: 28.3 pg (ref 26.0–34.0)
MCHC: 32.3 g/dL (ref 30.0–36.0)
MCV: 87.6 fL (ref 80.0–100.0)
Platelets: 249 10*3/uL (ref 150–400)
RBC: 4.98 MIL/uL (ref 3.87–5.11)
RDW: 12 % (ref 11.5–15.5)
WBC: 9.3 10*3/uL (ref 4.0–10.5)
nRBC: 0 % (ref 0.0–0.2)

## 2021-07-27 LAB — HIV ANTIBODY (ROUTINE TESTING W REFLEX): HIV Screen 4th Generation wRfx: NONREACTIVE

## 2021-07-27 MED ORDER — ACETAMINOPHEN 500 MG PO TABS
1000.0000 mg | ORAL_TABLET | Freq: Four times a day (QID) | ORAL | Status: DC | PRN
  Filled 2021-07-27: qty 2

## 2021-07-27 MED ORDER — ENOXAPARIN SODIUM 40 MG/0.4ML IJ SOSY: 40.0000 mg | PREFILLED_SYRINGE | INTRAMUSCULAR | Status: DC

## 2021-07-27 MED ORDER — SODIUM CHLORIDE 0.9 % IV SOLN
1.0000 g | INTRAVENOUS | Status: DC
  Administered 2021-07-28: 1 g via INTRAVENOUS
  Filled 2021-07-27: qty 1

## 2021-07-27 MED ORDER — ENOXAPARIN SODIUM 60 MG/0.6ML IJ SOSY
0.5000 mg/kg | PREFILLED_SYRINGE | INTRAMUSCULAR | Status: DC
  Administered 2021-07-27: 47.5 mg via SUBCUTANEOUS
  Filled 2021-07-27: qty 0.6

## 2021-07-27 MED ORDER — OLANZAPINE 5 MG PO TABS
5.0000 mg | ORAL_TABLET | Freq: Every day | ORAL | Status: DC
  Administered 2021-07-27: 5 mg via ORAL
  Filled 2021-07-27: qty 1

## 2021-07-27 MED ORDER — TRAZODONE HCL 50 MG PO TABS
150.0000 mg | ORAL_TABLET | Freq: Every day | ORAL | Status: DC
  Administered 2021-07-27: 150 mg via ORAL
  Filled 2021-07-27: qty 3

## 2021-07-27 MED ORDER — DIAZEPAM 2 MG PO TABS
2.0000 mg | ORAL_TABLET | Freq: Once | ORAL | Status: AC
  Administered 2021-07-27: 2 mg via ORAL
  Filled 2021-07-27: qty 1

## 2021-07-27 MED ORDER — SODIUM CHLORIDE 0.9% FLUSH
3.0000 mL | Freq: Two times a day (BID) | INTRAVENOUS | Status: DC
  Administered 2021-07-27: 3 mL via INTRAVENOUS

## 2021-07-27 MED ORDER — MECLIZINE HCL 25 MG PO TABS
25.0000 mg | ORAL_TABLET | Freq: Once | ORAL | Status: AC
  Administered 2021-07-27: 25 mg via ORAL
  Filled 2021-07-27: qty 1

## 2021-07-27 MED ORDER — INSULIN ASPART 100 UNIT/ML IJ SOLN
0.0000 [IU] | INTRAMUSCULAR | Status: DC
  Administered 2021-07-27: 5 [IU] via SUBCUTANEOUS
  Administered 2021-07-28: 2 [IU] via SUBCUTANEOUS
  Administered 2021-07-28: 3 [IU] via SUBCUTANEOUS
  Filled 2021-07-27 (×3): qty 1

## 2021-07-27 MED ORDER — FAMOTIDINE 20 MG PO TABS
20.0000 mg | ORAL_TABLET | Freq: Two times a day (BID) | ORAL | Status: DC
  Administered 2021-07-28: 20 mg via ORAL
  Filled 2021-07-27: qty 1

## 2021-07-27 MED ORDER — POLYETHYLENE GLYCOL 3350 17 G PO PACK
17.0000 g | PACK | Freq: Every day | ORAL | Status: DC | PRN
Start: 2021-07-27 — End: 2021-07-28

## 2021-07-27 MED ORDER — SODIUM CHLORIDE 0.9 % IV BOLUS
1000.0000 mL | Freq: Once | INTRAVENOUS | Status: AC
  Administered 2021-07-27: 1000 mL via INTRAVENOUS

## 2021-07-27 MED ORDER — SODIUM CHLORIDE 0.9 % IV SOLN
2.0000 g | Freq: Once | INTRAVENOUS | Status: AC
  Administered 2021-07-27: 2 g via INTRAVENOUS
  Filled 2021-07-27: qty 20

## 2021-07-27 MED ORDER — ADULT MULTIVITAMIN W/MINERALS CH
1.0000 | ORAL_TABLET | Freq: Every day | ORAL | Status: DC
  Administered 2021-07-27 – 2021-07-28 (×2): 1 via ORAL
  Filled 2021-07-27 (×2): qty 1

## 2021-07-27 MED ORDER — SODIUM CHLORIDE 0.9 % IV SOLN: INTRAVENOUS | Status: DC

## 2021-07-27 NOTE — H&P (Addendum)
History and Physical    Marilyn Davis:[id number] DOB: 16-Mar-1955 DOA: 07/27/2021  PCP: Miki Kins, FNP  Patient coming from: Home   Chief Complaint: Dizziness  HPI: Marilyn Davis is a 66 y.o. female with medical history for bipolar disorder, diabetes mellitus on insulin, sciatica, chronic pain syndrome, dyslipidemia and chronic dizziness presented with 1 day of worsening dizziness with inability to stand or walk.  She saw neurology last year for dizziness/vertigo/tremor and forgetfulness.  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  She was given Valium for dizziness. Today she presents complaining that her dizziness was worse.  She normally walks with a walker and was unable to even stand up without having gait instability.  Husband states last 3 days she has become more forgetful about what she wants to say. It appears she cant find the word, or forgets what she was going to say. No facial drooping, or other neurologic symptoms.  Today, she did not take much po intake except for 3 caffeinated cups of coffee.  Normally she reports she drinks 1 L of water.  She also states that at times her blood pressure does drop on standing and that she has to wait for a little bit while standing before she can walk.  But today her dizziness was so bad she could not even tolerate that. Does report if moving too fast or moves head too fast also gets dizzy.   ED Course: Initial vitals temperature 98.1, respiratory rate 18, heart rate 81, blood pressure 117/69.  O2 sat 93 to 97% room air. Urinalysis positive for leukocytes and bacteria.  Creatinine 1.35.  GFR 44.  Glucose 161.  WBC unremarkable.    CT of the head personally reviewed with no acute intracranial pathology. See full report plz. MRI brain pending.  EKG personally reviewed,NSR, Rbbb, no acute st/t change.  Orthostatics done in ED: Blood pressure lying 108/65, pulse 74 Blood pressure sitting  115/75, pulse 76 Blood pressure standing 98/46, pulse 80  Review of Systems: All systems reviewed and otherwise negative.    Past Medical History:  Diagnosis Date   Bipolar disorder (HCC)    Diabetes mellitus without complication (HCC)    Sciatica     Past Surgical History:  Procedure Laterality Date   ABDOMINAL HYSTERECTOMY     BREAST BIOPSY Right 09/30/2019   9:00, 5cmfn, Q shape, path pending   BREAST BIOPSY Right 09/30/2019   9:00, 9cmfn, vision, path pending   EXPLORATORY LAPAROTOMY     SHOULDER SURGERY     TONSILLECTOMY       reports that she has never smoked. She has never used smokeless tobacco. She reports that she does not drink alcohol and does not use drugs.  Allergies  Allergen Reactions   Pseudoephedrine Hcl Other (See Comments)    manic Other reaction(s): Hallucinations   Cabbage Nausea And Vomiting    Cooked    Ibuprofen Itching    Family History  Problem Relation Age of Onset   Breast cancer Neg Hx      Prior to Admission medications   Medication Sig Start Date End Date Taking? Authorizing Provider  atorvastatin (LIPITOR) 10 MG tablet Take 1 tablet (10 mg total) by mouth daily at 6 PM. 03/16/19   Money, Gerlene Burdock, FNP  buPROPion (WELLBUTRIN XL) 150 MG 24 hr tablet Take 1 tablet (150 mg total) by mouth daily. 03/16/19   Money, Gerlene Burdock, FNP  famotidine (PEPCID) 20 MG  tablet Take 1 tablet (20 mg total) by mouth 2 (two) times daily. 03/16/19   Money, Gerlene Burdock, FNP  fenofibrate 160 MG tablet Take 1 tablet (160 mg total) by mouth daily. 03/16/19   Money, Gerlene Burdock, FNP  insulin aspart (NOVOLOG) 100 UNIT/ML injection Inject 4 Units into the skin 3 (three) times daily with meals. 03/15/19   Clapacs, Jackquline Denmark, MD  insulin glargine (LANTUS) 100 UNIT/ML Solostar Pen Inject 32 Units into the skin at bedtime. 06/25/19   Cuthriell, Delorise Royals, PA-C  lamoTRIgine (LAMICTAL) 200 MG tablet Take 1 tablet (200 mg total) by mouth at bedtime. 03/16/19   Money, Gerlene Burdock, FNP   lubiprostone (AMITIZA) 8 MCG capsule Take 1 capsule (8 mcg total) by mouth daily with breakfast. 03/16/19   Money, Gerlene Burdock, FNP  mirtazapine (REMERON) 15 MG tablet Take 1 tablet (15 mg total) by mouth at bedtime. 03/16/19   Money, Gerlene Burdock, FNP  montelukast (SINGULAIR) 10 MG tablet Take 1 tablet (10 mg total) by mouth at bedtime. 03/16/19   Money, Gerlene Burdock, FNP  OLANZapine (ZYPREXA) 10 MG tablet Take 0.5 tablets (5 mg total) by mouth at bedtime. 03/16/19   Money, Gerlene Burdock, FNP  QUEtiapine (SEROQUEL) 25 MG tablet Take 1 tablet (25 mg total) by mouth 3 (three) times daily. 03/16/19   Money, Gerlene Burdock, FNP  traMADol (ULTRAM) 50 MG tablet Take 50-100 mg by mouth every 4 (four) hours as needed for moderate pain or severe pain.  02/26/19   [provider]  traZODone (DESYREL) 100 MG tablet Take 2.5 tablets (250 mg total) by mouth at bedtime. 03/16/19   Money, Gerlene Burdock, FNP  zolpidem (AMBIEN) 5 MG tablet Take 5 mg by mouth at bedtime as needed for sleep.    [provider]    Physical Exam: Vitals:   07/27/21 1302 07/27/21 1305 07/27/21 1643  BP:  117/69 105/73  Pulse:  81 74  Resp:  18 18  Temp:  98.1 F (36.7 C) 98.1 F (36.7 C)  TempSrc:  Oral Oral  SpO2:  93% 97%  Weight: 94.3 kg    Height: 5\' 4"  (1.626 m)      Constitutional: NAD, calm, comfortable Vitals:   07/27/21 1302 07/27/21 1305 07/27/21 1643  BP:  117/69 105/73  Pulse:  81 74  Resp:  18 18  Temp:  98.1 F (36.7 C) 98.1 F (36.7 C)  TempSrc:  Oral Oral  SpO2:  93% 97%  Weight: 94.3 kg    Height: 5\' 4"  (1.626 m)     Eyes: PERRL, lids and conjunctivae normal ENMT: Mucous membranes mildy dry Posterior pharynx clear of any exudate or lesions.Normal dentition.  Neck: normal, supple Respiratory: clear to auscultation bilaterally, no wheezing, no crackles. Normal respiratory effort. No accessory muscle use.  Cardiovascular: Regular rate and rhythm, no murmurs / rubs / gallops. No extremity edema. 2+ pedal pulses.  No carotid bruits.  Abdomen: no tenderness, no masses palpated. No hepatosplenomegaly. Bowel sounds positive.  Musculoskeletal: no clubbing / cyanosis. No joint deformity upper and lower extremities. Good ROM, no contractures. Normal muscle tone.  Skin:warm dry,no rashes Neurologic: CN 2-12 grossly intact. Sensation intact, Strength 5/5 in all 4. No facial droop. Finger to nose test nml Psychiatric: Normal judgment and insight. Alert and oriented x 3. Normal mood.    Labs on Admission: I have personally reviewed following labs and imaging studies  CBC: Recent Labs  Lab 07/27/21 1304  WBC 9.3  HGB 14.1  HCT 43.6  MCV 87.6  PLT 249   Basic Metabolic Panel: Recent Labs  Lab 07/27/21 1304  NA 136  K 4.8  CL 109  CO2 22  GLUCOSE 161*  BUN 17  CREATININE 1.35*  CALCIUM 9.8   GFR: Estimated Creatinine Clearance: 46.2 mL/min (A) (by C-G formula based on SCr of 1.35 mg/dL (H)). Liver Function Tests: No results for input(s): "AST", "ALT", "ALKPHOS", "BILITOT", "PROT", "ALBUMIN" in the last 168 hours. No results for input(s): "LIPASE", "AMYLASE" in the last 168 hours. No results for input(s): "AMMONIA" in the last 168 hours. Coagulation Profile: No results for input(s): "INR", "PROTIME" in the last 168 hours. Cardiac Enzymes: No results for input(s): "CKTOTAL", "CKMB", "CKMBINDEX", "TROPONINI" in the last 168 hours. BNP (last 3 results) No results for input(s): "PROBNP" in the last 8760 hours. HbA1C: No results for input(s): "HGBA1C" in the last 72 hours. CBG: No results for input(s): "GLUCAP" in the last 168 hours. Lipid Profile: No results for input(s): "CHOL", "HDL", "LDLCALC", "TRIG", "CHOLHDL", "LDLDIRECT" in the last 72 hours. Thyroid Function Tests: No results for input(s): "TSH", "T4TOTAL", "FREET4", "T3FREE", "THYROIDAB" in the last 72 hours. Anemia Panel: No results for input(s): "VITAMINB12", "FOLATE", "FERRITIN", "TIBC", "IRON", "RETICCTPCT" in the last 72  hours. Urine analysis:    Component Value Date/Time   COLORURINE YELLOW (A) 07/27/2021 1305   APPEARANCEUR HAZY (A) 07/27/2021 1305   LABSPEC 1.017 07/27/2021 1305   PHURINE 5.0 07/27/2021 1305   GLUCOSEU NEGATIVE 07/27/2021 1305   HGBUR NEGATIVE 07/27/2021 1305   BILIRUBINUR NEGATIVE 07/27/2021 1305   KETONESUR 5 (A) 07/27/2021 1305   PROTEINUR 100 (A) 07/27/2021 1305   NITRITE NEGATIVE 07/27/2021 1305   LEUKOCYTESUR SMALL (A) 07/27/2021 1305    Radiological Exams on Admission: CT HEAD WO CONTRAST ( )  Result Date: 07/27/2021 CLINICAL DATA:  Dizziness.  Status post fall. EXAM: CT HEAD WITHOUT CONTRAST TECHNIQUE: Contiguous axial images were obtained from the base of the skull through the vertex without intravenous contrast. RADIATION DOSE REDUCTION: This exam was performed according to the departmental dose-optimization program which includes automated exposure control, adjustment of the mA and/or kV according to patient size and/or use of iterative reconstruction technique. COMPARISON:  None Available. FINDINGS: Brain: No evidence of acute infarction, hemorrhage, extra-axial collection, ventriculomegaly, or mass effect. Generalized cerebral atrophy. Periventricular white matter low attenuation likely secondary to microangiopathy. Vascular: Cerebrovascular atherosclerotic calcifications are noted. Skull: Negative for fracture or focal lesion. Sinuses/Orbits: Visualized portions of the orbits are unremarkable. Complete opacification of the right maxillary sinus. Visualized portions of the mastoid air cells are unremarkable. Other: None. IMPRESSION: 1. No acute intracranial pathology. 2. Chronic microangiopathy and cerebral atrophy. 3. Chronic right maxillary sinusitis. Electronically Signed   By: Elige Ko M.D.   On: 07/27/2021 15:59      Assessment/Plan Active Problems:   Bipolar 1 disorder, depressed, severe (HCC)   Diabetes (HCC)   Chronic pain syndrome   Dyslipidemia    Uncontrolled type 1 diabetes mellitus with hyperglycemia, with long-term current use of insulin (HCC)   AKI (acute kidney injury) (HCC)   UTI (urinary tract infection)    Acute on chronic dizziness -Worsening likely due to low blood pressure on standing and dehydration/AKI Renal function worse from baseline Not much p.o. intake and has been drinking more coffee consider drinking water for hydration Does have a component of vertigo With some speech issues last 3 days ER ordered MRI of the brain, currently pending Recommend IV fluids for hydration We  will add meclizine standing dose for the vertigo We will need to check orthostatics daily We will need SCDs 20 to 30 mmHg for while standing to prevent blood pressure drop Consult PT OT   2.Marland KitchenAKI Due to dehydration/decrease po intake 1L fluid given in ER, will continue at 174ml/hr. Avoid nephrotoxic meds Monitor labs  Diabetes with hyperglycemia on insulin  Start RISS Once taking more po intake, recommend start home dose insulin after pharmacy does med rec. Reconciliation  3. DL On statin, will resume when pharmacy completes med rec  4.  Chronic pain syndrome on Toradol as outpatient chronically.  Will resume once med rec reconciliate it  5.  Bipolar disorder type I/depressed Continue olanzapine Continue outpatient meds once med rec. Reconciled by pharmacy Consulted pharmacy for med rec. Reconcilliation.  6.UTI +UA Was started on Rocephin in ER we will continue Follow-up urine culture  DVT prophylaxis: Lovenox Code Status: Full Family Communication: Husband at bedside Disposition Plan: Back home Consults called: None Admission status: Observation as patient requires less than 2 midnight stays.   Lynn Ito MD [facility]  If 7PM-7AM, please contact night-coverage [email or web address] Password [facility]  07/27/2021, 5:56 PM

## 2021-07-27 NOTE — ED Notes (Signed)
First Nurse Note: Pt to ED via POV for dizziness for "a while" but states that yesterday it started getting worse and pt is having trouble standing because of the dizziness. Pt is in NAD.

## 2021-07-27 NOTE — ED Provider Notes (Signed)
[facility] Provider Note    Event Date/Time   First MD Initiated Contact with Patient 07/27/21 1407     (approximate)   History   Dizziness   HPI  Marilyn Davis is a 66 y.o. female here with dizziness.  The patient has a fairly complex history including previous episodes of vertigo.  She states that over the last week or so, she has had increasing dizziness and generalized weakness.  Over the last day, she has had profound dizziness, loss of balance, and has had difficulty ambulating due to this.  She also feels generally very weak and has not really been able to support herself today.  She has not really eaten or drinking very much over the last 24 hours.  She had some mild urinary urgency.  Denies any recent head trauma.  She does note that her speech has been slightly slurred or delayed today, which is also new for her.  She states that she does have a diagnosis of vertigo but felt that her current symptoms feel different in terms of their severity as well as the generalized weakness associated with it.  No recent medication changes.     Physical Exam   Triage Vital Signs: ED Triage Vitals  Enc Vitals Group     BP 07/27/21 1305 117/69     Pulse Rate 07/27/21 1305 81     Resp 07/27/21 1305 18     Temp 07/27/21 1305 98.1 F (36.7 C)     Temp Source 07/27/21 1305 Oral     SpO2 07/27/21 1305 93 %     Weight 07/27/21 1302 207 lb 14.3 oz (94.3 kg)     Height 07/27/21 1302 5\' 4"  (1.626 m)     Head Circumference --      Peak Flow --      Pain Score 07/27/21 1302 0     Pain Loc --      Pain Edu? --      Excl. in [place]? --     Most recent vital signs: Vitals:   07/27/21 1955 07/27/21 2055  BP: (!) 126/49 119/82  Pulse: 76 72  Resp: 18 16  Temp: 98.1 F (36.7 C) 98.1 F (36.7 C)  SpO2: 97% 93%     General: Awake, no distress.  CV:  Good peripheral perfusion.  Regular rate and rhythm. Resp:  Normal effort.  Lungs clear  bilaterally. Abd:  No distention.  No tenderness.  No CVA tenderness. Other:  Mild horizontal nystagmus noted bilaterally.  Mild tremor noted with finger-to-nose testing.  Strength out of 5 bilateral upper and lower extremity.  Normal sensation to light touch.  Mildly dry mucous membranes noted.   ED Results / Procedures / Treatments   Labs (all labs ordered are listed, but only abnormal results are displayed) Labs Reviewed  BASIC METABOLIC PANEL - Abnormal; Notable for the following components:      Result Value   Glucose, Bld 161 (*)    Creatinine, Ser 1.35 (*)    GFR, Estimated 44 (*)    All other components within normal limits  URINALYSIS, ROUTINE W REFLEX MICROSCOPIC - Abnormal; Notable for the following components:   Color, Urine YELLOW (*)    APPearance HAZY (*)    Ketones, ur 5 (*)    Protein, ur 100 (*)    Leukocytes,Ua SMALL (*)    Bacteria, UA RARE (*)    All other components within normal limits  CBG MONITORING,  ED - Abnormal; Notable for the following components:   Glucose-Capillary 219 (*)    All other components within normal limits  CBG MONITORING, ED - Abnormal; Notable for the following components:   Glucose-Capillary 131 (*)    All other components within normal limits  URINE CULTURE  CBC  HIV ANTIBODY (ROUTINE TESTING W REFLEX)     EKG Normal sinus rhythm, ventricular rate 83.  PR 182, QRS 110, QTc 453.  No acute ST elevations or depressions.  No EKG evidence of acute ischemia or infarct.   RADIOLOGY CT head: No acute intracranial abnormality   I also independently reviewed and agree with radiologist interpretations.   PROCEDURES:  Critical Care performed: No  .1-3 Lead EKG Interpretation  Performed by: Duffy Bruce, MD Authorized by: Duffy Bruce, MD     Interpretation: normal     ECG rate:  70-90   ECG rate assessment: normal     Rhythm: sinus rhythm     Ectopy: none     Conduction: normal   Comments:     Indication:  Generalized weakness     MEDICATIONS ORDERED IN ED: Medications  0.9 %  sodium chloride infusion ( Intravenous New Bag/Given 07/27/21 2223)  OLANZapine (ZYPREXA) tablet 5 mg (5 mg Oral Given 07/27/21 2213)  famotidine (PEPCID) tablet 20 mg (20 mg Oral Patient Refused/Not Given 07/27/21 2216)  insulin aspart (novoLOG) injection 0-15 Units ( Subcutaneous Not Given 07/27/21 2027)  sodium chloride flush (NS) 0.9 % injection 3 mL (3 mLs Intravenous Given 07/27/21 2225)  polyethylene glycol (MIRALAX / GLYCOLAX) packet 17 g (has no administration in time range)  multivitamin with minerals tablet 1 tablet (1 tablet Oral Given 07/27/21 1855)  enoxaparin (LOVENOX) injection 47.5 mg (47.5 mg Subcutaneous Given 07/27/21 2216)  cefTRIAXone (ROCEPHIN) 1 g in sodium chloride 0.9 % 100 mL IVPB (has no administration in time range)  traZODone (DESYREL) tablet 150 mg (150 mg Oral Given 07/27/21 2213)  acetaminophen (TYLENOL) tablet 1,000 mg (has no administration in time range)  sodium chloride 0.9 % bolus 1,000 mL (0 mLs Intravenous Stopping previously hung infusion 07/27/21 1501)  cefTRIAXone (ROCEPHIN) 2 g in sodium chloride 0.9 % 100 mL IVPB (0 g Intravenous Stopped 07/27/21 1532)  meclizine (ANTIVERT) tablet 25 mg (25 mg Oral Given 07/27/21 1503)  diazepam (VALIUM) tablet 2 mg (2 mg Oral Given 07/27/21 1633)     IMPRESSION / MDM / ASSESSMENT AND PLAN / ED COURSE  I reviewed the triage vital signs and the nursing notes.                               The patient is on the cardiac monitor to evaluate for evidence of arrhythmia and/or significant heart rate changes.   Ddx:  Differential includes the following, with pertinent life- or limb-threatening emergencies considered:  Recurrent peripheral vertigo, TIA, stroke, generalized medical condition causing recrudescence of her symptoms such as dehydration, UTI, polypharmacy  Patient's presentation is most consistent with acute presentation with potential threat to life  or bodily function.  MDM:  66 yo F here with dizziness, weakness, inability to ambulate. Suspect acute on chronic vertigo in setting of dehydration, possible UTI. CT head is unremarkable. Labs show UA c/f UTI. CMP is consistent with mild dehydration. Pt given meclizine, fluids, rocephin here but remains vertiginous and unable to ambulate. Will admit for MRI, hydration, and further work-up.   MEDICATIONS GIVEN IN ED: Medications  0.9 %  sodium chloride infusion ( Intravenous New Bag/Given 07/27/21 2223)  OLANZapine (ZYPREXA) tablet 5 mg (5 mg Oral Given 07/27/21 2213)  famotidine (PEPCID) tablet 20 mg (20 mg Oral Patient Refused/Not Given 07/27/21 2216)  insulin aspart (novoLOG) injection 0-15 Units ( Subcutaneous Not Given 07/27/21 2027)  sodium chloride flush (NS) 0.9 % injection 3 mL (3 mLs Intravenous Given 07/27/21 2225)  polyethylene glycol (MIRALAX / GLYCOLAX) packet 17 g (has no administration in time range)  multivitamin with minerals tablet 1 tablet (1 tablet Oral Given 07/27/21 1855)  enoxaparin (LOVENOX) injection 47.5 mg (47.5 mg Subcutaneous Given 07/27/21 2216)  cefTRIAXone (ROCEPHIN) 1 g in sodium chloride 0.9 % 100 mL IVPB (has no administration in time range)  traZODone (DESYREL) tablet 150 mg (150 mg Oral Given 07/27/21 2213)  acetaminophen (TYLENOL) tablet 1,000 mg (has no administration in time range)  sodium chloride 0.9 % bolus 1,000 mL (0 mLs Intravenous Stopping previously hung infusion 07/27/21 1501)  cefTRIAXone (ROCEPHIN) 2 g in sodium chloride 0.9 % 100 mL IVPB (0 g Intravenous Stopped 07/27/21 1532)  meclizine (ANTIVERT) tablet 25 mg (25 mg Oral Given 07/27/21 1503)  diazepam (VALIUM) tablet 2 mg (2 mg Oral Given 07/27/21 1633)     Consults:  Hospitalist   EMR reviewed  Reviewed prior Neurology notes      FINAL CLINICAL IMPRESSION(S) / ED DIAGNOSES   Final diagnoses:  Vertigo  Dehydration  Acute cystitis without hematuria     Rx / DC Orders   ED Discharge Orders      None        Note:  This document was prepared using Dragon voice recognition software and may include unintentional dictation errors.   Duffy Bruce, MD 07/27/21 2242

## 2021-07-27 NOTE — ED Triage Notes (Signed)
Pt states yesterday when she got up she fell because her right leg gave out. Pt states the dizziness has continued to get worse. Pt denies blood thinners, LOC or head trauma.   Pt sees a neurologist at [facility] for dizziness.

## 2021-07-27 NOTE — Progress Notes (Signed)
Cross Cover Ordered trazodone for sleep per home meds. Patient was also requesting seroquel and ambien. Those were not ordered

## 2021-07-27 NOTE — ED Notes (Signed)
See triage note  presents with dizziness for the past 2 days   states she has had these sx's in the past  having some nausea

## 2021-07-27 NOTE — Progress Notes (Signed)
PHARMACIST - PHYSICIAN COMMUNICATION  CONCERNING:  Enoxaparin (Lovenox) for DVT Prophylaxis    RECOMMENDATION: Patient was prescribed enoxaprin 40mg  q24 hours for VTE prophylaxis.   Filed Weights   07/27/21 1302  Weight: 94.3 kg (207 lb 14.3 oz)    Body mass index is 35.68 kg/m.  Estimated Creatinine Clearance: 46.2 mL/min (A) (by C-G formula based on SCr of 1.35 mg/dL (H)).   Based on [facility] policy patient is candidate for enoxaparin 0.5mg /kg TBW SQ every 24 hours based on BMI being >30.  DESCRIPTION: Pharmacy has adjusted enoxaparin dose per [facility] policy.  Patient is now receiving enoxaparin 47.5 mg every 24 hours    [facility], PharmD Clinical Pharmacist  07/27/2021 5:36 PM

## 2021-07-28 DIAGNOSIS — N179 Acute kidney failure, unspecified: Secondary | ICD-10-CM | POA: Diagnosis not present

## 2021-07-28 DIAGNOSIS — E785 Hyperlipidemia, unspecified: Secondary | ICD-10-CM | POA: Diagnosis not present

## 2021-07-28 DIAGNOSIS — G894 Chronic pain syndrome: Secondary | ICD-10-CM | POA: Diagnosis not present

## 2021-07-28 DIAGNOSIS — F314 Bipolar disorder, current episode depressed, severe, without psychotic features: Secondary | ICD-10-CM | POA: Diagnosis not present

## 2021-07-28 DIAGNOSIS — E1065 Type 1 diabetes mellitus with hyperglycemia: Secondary | ICD-10-CM | POA: Diagnosis not present

## 2021-07-28 LAB — CREATININE, SERUM
Creatinine, Ser: 1.19 mg/dL — ABNORMAL HIGH (ref 0.44–1.00)
GFR, Estimated: 51 mL/min — ABNORMAL LOW (ref >60)

## 2021-07-28 LAB — GLUCOSE, CAPILLARY
Glucose-Capillary: 119 mg/dL — ABNORMAL HIGH (ref 70–99)
Glucose-Capillary: 126 mg/dL — ABNORMAL HIGH (ref 70–99)
Glucose-Capillary: 138 mg/dL — ABNORMAL HIGH (ref 70–99)
Glucose-Capillary: 156 mg/dL — ABNORMAL HIGH (ref 70–99)

## 2021-07-28 MED ORDER — MECLIZINE HCL 25 MG PO TABS: 25.0000 mg | ORAL_TABLET | Freq: Three times a day (TID) | ORAL | 0 refills | Status: AC

## 2021-07-28 MED ORDER — PANTOPRAZOLE SODIUM 40 MG PO TBEC
40.0000 mg | DELAYED_RELEASE_TABLET | Freq: Every day | ORAL | Status: DC
  Administered 2021-07-28: 40 mg via ORAL
  Filled 2021-07-28: qty 1

## 2021-07-28 MED ORDER — MECLIZINE HCL 25 MG PO TABS
25.0000 mg | ORAL_TABLET | Freq: Three times a day (TID) | ORAL | Status: DC
  Administered 2021-07-28: 25 mg via ORAL
  Filled 2021-07-28 (×2): qty 1

## 2021-07-28 MED ORDER — SEMAGLUTIDE 3 MG PO TABS: 3.0000 mg | ORAL_TABLET | Freq: Every day | ORAL | Status: DC

## 2021-07-28 MED ORDER — FENOFIBRATE 160 MG PO TABS
160.0000 mg | ORAL_TABLET | Freq: Every day | ORAL | Status: DC
  Administered 2021-07-28: 160 mg via ORAL
  Filled 2021-07-28: qty 1

## 2021-07-28 MED ORDER — ATORVASTATIN CALCIUM 20 MG PO TABS: 10.0000 mg | ORAL_TABLET | Freq: Every day | ORAL | Status: DC

## 2021-07-28 MED ORDER — TRAMADOL HCL 50 MG PO TABS
50.0000 mg | ORAL_TABLET | Freq: Two times a day (BID) | ORAL | Status: DC
  Administered 2021-07-28: 50 mg via ORAL
  Filled 2021-07-28: qty 1

## 2021-07-28 MED ORDER — CEPHALEXIN 500 MG PO CAPS: 500.0000 mg | ORAL_CAPSULE | Freq: Three times a day (TID) | ORAL | 0 refills | Status: AC

## 2021-07-28 NOTE — TOC Progression Note (Signed)
Transition of Care [facility]) - Progression Note    Patient Details  Name: Marilyn Davis MRN: [id number] Date of Birth: Nov 23, 1955  [facility] CM/SW Contact  Bing Quarry, RN Phone Number: 07/28/2021, 10:33 AM  Clinical Narrative: 6/10: patient in OBS status and will be discharging to home with [facility] set up via [employer] confirmed. Has all needed DME and help at home via spouse. Spoke with patient and has not had any HH agency before and [facility] was accepted. Gabriel Cirri RN CM         Barriers to Discharge: Barriers Resolved  Expected Discharge Plan and Services           Expected Discharge Date: 07/28/21               DME Arranged: N/A DME Agency: NA       HH Arranged: OT, PT (HH Aide) HH Agency: [facility] Date [facility] Agency Contacted: 07/28/21 Time HH Agency Contacted: 1022 Representative spoke with at [facility] Agency: Kandee Keen   Social Determinants of Health (SDOH) Interventions    Readmission Risk Interventions     No data to display

## 2021-07-28 NOTE — Evaluation (Signed)
Physical Therapy Evaluation Patient Details Name: Marilyn Davis MRN: [id number] DOB: 1955/06/12 Today's Date: 07/28/2021  History of Present Illness  Marilyn Davis is a 66 y.o. female with medical history for bipolar disorder, diabetes mellitus on insulin, sciatica, chronic pain syndrome, dyslipidemia and chronic dizziness presented with 1 day of worsening dizziness with inability to stand or walk.  She saw neurology last year for dizziness/vertigo/tremor and forgetfulness.  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  She was given Valium for dizziness.  Today she presents complaining that her dizziness was worse.  She normally walks with a walker and was unable to even stand up without having gait instability.  Husband states last 3 days she has become more forgetful about what she wants to say. It appears she cant find the word, or forgets what she was going to say. No facial drooping, or other neurologic symptoms.   Today, she did not take much po intake except for 3 caffeinated cups of coffee.  Normally she reports she drinks 1 L of water.  She also states that at times her blood pressure does drop on standing and that she has to wait for a little bit while standing before she can walk.  But today her dizziness was so bad she could not even tolerate that.  Does report if moving too fast or moves head too fast also gets dizzy.  Clinical Impression  Pt received in bed agreed to participate with PT evaluation. Pt's BP as per MD and RN [facility].Pt is afraid of falling, without c/o of dizziness and w/o LOB. Pt ambulate 100 ft with FWW with Sup x 1 with Max assist for IV pole. Transfer including STS and Bed<-> W/C with sup.   Pt demonstrates proximal muscle weakness in BLE  evident by 5 x STS completed in 35 secs. Pt will benefit from home health PT and aide for a safe discharge home with husband who is with her 24 hours.      Recommendations for follow up therapy  are one component of a multi-disciplinary discharge planning process, led by the attending physician.  Recommendations may be updated based on patient status, additional functional criteria and insurance authorization.  Follow Up Recommendations Home health PT    Assistance Recommended at Discharge Set up Supervision/Assistance  Patient can return home with the following  A little help with walking and/or transfers;A little help with bathing/dressing/bathroom;Assistance with cooking/housework    Equipment Recommendations Rolling walker (2 wheels)  Recommendations for Other Services       Functional Status Assessment Patient has had a recent decline in their functional status and demonstrates the ability to make significant improvements in function in a reasonable and predictable amount of time.     Precautions / Restrictions Precautions Precautions: Fall Restrictions Weight Bearing Restrictions: No      Mobility  Bed Mobility Overal bed mobility: Independent                  Transfers Overall transfer level: Needs assistance Equipment used: Rolling walker (2 wheels) Transfers: Sit to/from Stand, Bed to chair/wheelchair/BSC Sit to Stand: Supervision   Step pivot transfers: Supervision       General transfer comment: due to IV and pt afraid of falling.    Ambulation/Gait Ambulation/Gait assistance: Supervision (max assist with the IV pole.) Gait Distance (Feet): 100 Feet Assistive device: Rolling walker (2 wheels) Gait Pattern/deviations: Step-through pattern, Antalgic (slow) Gait velocity: decreased  Stairs            Wheelchair Mobility    Modified Rankin (Stroke Patients Only)       Balance Overall balance assessment: Needs assistance Sitting-balance support: No upper extremity supported Sitting balance-Leahy Scale: Good     Standing balance support: Bilateral upper extremity supported Standing balance-Leahy Scale: Good                    Standardized Balance Assessment Standardized Balance Assessment :  (5 x STS in 35 secs)           Pertinent Vitals/Pain      Home Living Family/patient expects to be discharged to:: Private residence Living Arrangements: Spouse/significant other Available Help at Discharge: Family;Available 24 hours/day Type of Home: Apartment Home Access: Elevator       Home Layout: Two level Home Equipment: Rollator (4 wheels);Tub bench;Grab bars - toilet;Grab bars - tub/shower      Prior Function Prior Level of Function : Independent/Modified Independent                     Hand Dominance   Dominant Hand: Right    Extremity/Trunk Assessment   Upper Extremity Assessment Upper Extremity Assessment: Overall WFL for tasks assessed    Lower Extremity Assessment Lower Extremity Assessment: Generalized weakness       Communication   Communication: No difficulties  Cognition Arousal/Alertness: Awake/alert Behavior During Therapy: WFL for tasks assessed/performed Overall Cognitive Status: Within Functional Limits for tasks assessed                                          General Comments      Exercises     Assessment/Plan    PT Assessment Patient needs continued PT services  PT Problem List Decreased strength;Decreased activity tolerance;Decreased balance       PT Treatment Interventions Gait training;Therapeutic exercise;Neuromuscular re-education    PT Goals (Current goals can be found in the Care Plan section)  Acute Rehab PT Goals Patient Stated Goal: To get stronger and to not fall. . PT Goal Formulation: With patient Time For Goal Achievement: 08/04/21 Potential to Achieve Goals: Good Additional Goals Additional Goal #1: Pt will complete 5 x STS in <20 secs to imporve strength in BLE and decrease fall risk.    Frequency Min 2X/week     Co-evaluation               AM-PAC PT "6 Clicks" Mobility  Outcome  Measure Help needed turning from your back to your side while in a flat bed without using bedrails?: None Help needed moving from lying on your back to sitting on the side of a flat bed without using bedrails?: None Help needed moving to and from a bed to a chair (including a wheelchair)?: None Help needed standing up from a chair using your arms (e.g., wheelchair or bedside chair)?: A Little Help needed to walk in hospital room?: A Little Help needed climbing 3-5 steps with a railing? : A Little 6 Click Score: 21    End of Session Equipment Utilized During Treatment: Gait belt Activity Tolerance: Patient tolerated treatment well Patient left: in chair;with call bell/phone within reach Nurse Communication: Mobility status PT Visit Diagnosis: Muscle weakness (generalized) (M62.81);Ataxic gait (R26.0)    Time: 0930-1001 PT Time Calculation (min) (ACUTE ONLY): 31 min   Charges:  PT Evaluation $PT Eval Low Complexity: 1 Low PT Treatments $Gait Training: 8-22 mins $Therapeutic Activity: 8-22 mins        Astra Gregg PT DPT 12:16 PM,07/28/21   Kely Dohn H Jasmina Gendron 07/28/2021, 12:11 PM

## 2021-07-28 NOTE — Discharge Summary (Signed)
BEVELYN ALBER Davis:[id number] DOB: 06/03/1955 DOA: 07/27/2021  PCP: Miki Kins, FNP  Admit date: 07/27/2021 Discharge date: 07/28/2021  Admitted From: home Disposition:  home  Recommendations for Outpatient Follow-up:  Follow up with PCP in 1 week Please obtain BMP/CBC in one week Please follow up with neurology in one week     Discharge Condition:Stable CODE STATUS:Full  Diet recommendation: Heart Healthy / Carb Modified  Brief/Interim Summary: Per HPI: Marilyn Davis is a 66 y.o. female with medical history for bipolar disorder, diabetes mellitus on insulin, sciatica, chronic pain syndrome, dyslipidemia and chronic dizziness presented with 1 day of worsening dizziness with inability to stand or walk.  She saw neurology last year for dizziness/vertigo/tremor and forgetfulness.  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  She was given Valium for dizziness. Today she presents complaining that her dizziness was worse.  She normally walks with a walker and was unable to even stand up without having gait instability.  Husband states last 3 days she has become more forgetful about what she wants to say. It appears she cant find the word, or forgets what she was going to say. No facial drooping, or other neurologic symptoms.  Today, she did not take much po intake except for 3 caffeinated cups of coffee.  Normally she reports she drinks 1 L of water.  She also states that at times her blood pressure does drop on standing and that she has to wait for a little bit while standing before she can walk.  But today her dizziness was so bad she could not even tolerate that. Does report if moving too fast or moves head too fast also gets dizzy.     ED Course: Initial vitals temperature 98.1, respiratory rate 18, heart rate 81, blood pressure 117/69.  O2 sat 93 to 97% room air. Urinalysis positive for leukocytes and bacteria.  Creatinine 1.35.  GFR 44.   Glucose 161.  WBC unremarkable.     CT of the head personally reviewed with no acute intracranial pathology. See full report below. MRI brain without acute findings.     Patient was admitted to the hospital.  Her blood pressure medications were held.  She was positive for orthostatics and was found with AKI due to dehydration.  She was started on IV fluids with improvement of her kidneys and orthostasis.  This a.m. she did not feel dizzy upon standing.  Physical therapy saw patient prior to discharge and recommended home health and she remained asymptomatic.  She was also treated for urinary tract infection as her UA was positive.  Urine culture still pending.  She will be sent home with p.o. medication to complete course.  She did receive IV antibiotics while in the hospital.  Acute on chronic dizziness -Worsening likely due to low blood pressure on standing and dehydration/AKI Renal function worse from baseline Not much p.o. intake and has been drinking more coffee consider drinking water for hydration Does have a component of vertigo CT of the head and MRI of the brain negative for acute abnormality  She was placed on meclizine  Orthostatics were checked daily  Started on IV fluids  Clinically improved May need to reevaluate her multiple medications as some of it may be also attributing to her dizziness.     2..AKI Due to dehydration/decrease po intake Improved with IV fluids Encouraged to increase her hydration at home and avoid drinking multiple cups of coffee daily  3.Diabetes with hyperglycemia on insulin  Resume home meds   4. DL On statin   4.  Chronic pain syndrome  -on Toradol as outpatient chronically.     5.  Bipolar disorder type I/depressed Continue home meds    6.UTI +UA Urine culture so far pending/negative Was treated with IV Rocephin    Discharge Diagnoses:  Active Problems:   Bipolar 1 disorder, depressed, severe (HCC)   Diabetes (HCC)   Chronic  pain syndrome   Dyslipidemia   Uncontrolled type 1 diabetes mellitus with hyperglycemia, with long-term current use of insulin (HCC)   AKI (acute kidney injury) (HCC)   UTI (urinary tract infection)    Discharge Instructions  Discharge Instructions     Call MD for:  persistant dizziness or light-headedness   Complete by: As directed    Diet - low sodium heart healthy   Complete by: As directed    Discharge instructions   Complete by: As directed    Hydrate. Cut back on caffeine to one cup coffee/tea per day Dont take blood pressure meds until you see your doctor   Increase activity slowly   Complete by: As directed       Allergies as of 07/28/2021       Reactions   Pseudoephedrine Hcl Other (See Comments)   manic Other reaction(s): Hallucinations   Cabbage Nausea And Vomiting   Cooked    Ibuprofen Itching        Medication List     STOP taking these medications    famotidine 20 MG tablet Commonly known as: PEPCID   fenofibrate micronized 200 MG capsule Commonly known as: LOFIBRA   insulin aspart 100 UNIT/ML injection Commonly known as: novoLOG   lubiprostone 8 MCG capsule Commonly known as: AMITIZA   propranolol 20 MG tablet Commonly known as: INDERAL   zolpidem 5 MG tablet Commonly known as: AMBIEN       TAKE these medications    alendronate 70 MG tablet Commonly known as: FOSAMAX Take 70 mg by mouth once a week.   atorvastatin 10 MG tablet Commonly known as: LIPITOR Take 1 tablet (10 mg total) by mouth daily at 6 PM.   buPROPion 150 MG 24 hr tablet Commonly known as: WELLBUTRIN XL Take 1 tablet (150 mg total) by mouth daily. What changed: Another medication with the same name was removed. Continue taking this medication, and follow the directions you see here.   cephALEXin 500 MG capsule Commonly known as: KEFLEX Take 1 capsule (500 mg total) by mouth 3 (three) times daily for 1 day.   fenofibrate 160 MG tablet Take 1 tablet (160 mg  total) by mouth daily.   fluticasone 50 MCG/ACT nasal spray Commonly known as: FLONASE Place 2 sprays into both nostrils daily.   gabapentin 300 MG capsule Commonly known as: NEURONTIN Take 300 mg by mouth daily.   insulin glargine 100 UNIT/ML Solostar Pen Commonly known as: LANTUS Inject 32 Units into the skin at bedtime.   lamoTRIgine 200 MG tablet Commonly known as: LAMICTAL Take 1 tablet (200 mg total) by mouth at bedtime.   linaclotide 145 MCG Caps capsule Commonly known as: LINZESS Take 145 mcg by mouth daily.   meclizine 25 MG tablet Commonly known as: ANTIVERT Take 1 tablet (25 mg total) by mouth 3 (three) times daily. What changed: when to take this   mirtazapine 15 MG tablet Commonly known as: REMERON Take 1 tablet (15 mg total) by mouth at bedtime.  montelukast 10 MG tablet Commonly known as: SINGULAIR Take 1 tablet (10 mg total) by mouth at bedtime.   OLANZapine 10 MG tablet Commonly known as: ZYPREXA Take 0.5 tablets (5 mg total) by mouth at bedtime.   pantoprazole 40 MG tablet Commonly known as: PROTONIX Take 40 mg by mouth daily.   QUEtiapine 25 MG tablet Commonly known as: SEROQUEL Take 1 tablet (25 mg total) by mouth 3 (three) times daily.   Rybelsus 3 MG Tabs Generic drug: Semaglutide Take 3 mg by mouth daily.   traMADol 50 MG tablet Commonly known as: ULTRAM Take 50 mg by mouth in the morning, at noon, and at bedtime.   traZODone 100 MG tablet Commonly known as: DESYREL Take 2.5 tablets (250 mg total) by mouth at bedtime. What changed: Another medication with the same name was removed. Continue taking this medication, and follow the directions you see here.        Follow-up Information     Bridgette Habermann, PA-C Follow up in 1 week(s).   Specialty: Physician Assistant Contact information: [address] [place] [address] [phone]         Miki Kins, FNP Follow up in 1 week(s).   Specialty: Family  Medicine Contact information: [address] [place] [address] [phone]                Allergies  Allergen Reactions   Pseudoephedrine Hcl Other (See Comments)    manic Other reaction(s): Hallucinations   Cabbage Nausea And Vomiting    Cooked    Ibuprofen Itching    Consultations:    Procedures/Studies: MR BRAIN WO CONTRAST  Result Date: 07/27/2021 CLINICAL DATA:  Worsening dizziness EXAM: MRI HEAD WITHOUT CONTRAST TECHNIQUE: Multiplanar, multiecho pulse sequences of the brain and surrounding structures were obtained without intravenous contrast. COMPARISON:  06/16/2020 FINDINGS: Brain: No restricted diffusion to suggest acute or subacute infarct. No acute hemorrhage, mass, mass effect, or midline shift. No hemosiderin deposition to suggest remote hemorrhage. No hydrocephalus or extra-axial collection. Vascular: Normal arterial flow voids. Skull and upper cervical spine: Normal marrow signal. Sinuses/Orbits: Chronic right maxillary sinusitis. Minimal mucosal thickening in the ethmoid air cells and left maxillary sinus. Orbits are unremarkable. Other: None. IMPRESSION: No acute intracranial process. No evidence of acute or subacute infarct. Electronically Signed   By: Wiliam Ke M.D.   On: 07/27/2021 19:57   CT HEAD WO CONTRAST ( )  Result Date: 07/27/2021 CLINICAL DATA:  Dizziness.  Status post fall. EXAM: CT HEAD WITHOUT CONTRAST TECHNIQUE: Contiguous axial images were obtained from the base of the skull through the vertex without intravenous contrast. RADIATION DOSE REDUCTION: This exam was performed according to the departmental dose-optimization program which includes automated exposure control, adjustment of the mA and/or kV according to patient size and/or use of iterative reconstruction technique. COMPARISON:  None Available. FINDINGS: Brain: No evidence of acute infarction, hemorrhage, extra-axial collection, ventriculomegaly, or mass effect. Generalized cerebral  atrophy. Periventricular white matter low attenuation likely secondary to microangiopathy. Vascular: Cerebrovascular atherosclerotic calcifications are noted. Skull: Negative for fracture or focal lesion. Sinuses/Orbits: Visualized portions of the orbits are unremarkable. Complete opacification of the right maxillary sinus. Visualized portions of the mastoid air cells are unremarkable. Other: None. IMPRESSION: 1. No acute intracranial pathology. 2. Chronic microangiopathy and cerebral atrophy. 3. Chronic right maxillary sinusitis. Electronically Signed   By: Elige Ko M.D.   On: 07/27/2021 15:59      Subjective: Feels better.  Feels more energy.  Denies any orthostatic symptoms during orthostatic checks.  Denies any dizziness when she stood up.  Discharge Exam: Vitals:   07/28/21 0817 07/28/21 1149  BP: (!) 123/57 (!) 125/56  Pulse: 72 79  Resp: 17 16  Temp: 98.2 F (36.8 C) 98.2 F (36.8 C)  SpO2: 95% 95%   Vitals:   07/28/21 0053 07/28/21 0606 07/28/21 0817 07/28/21 1149  BP: (!) 106/33 (!) 116/52 (!) 123/57 (!) 125/56  Pulse: 71 72 72 79  Resp: 16 16 17 16   Temp: 97.7 F (36.5 C) 97.7 F (36.5 C) 98.2 F (36.8 C) 98.2 F (36.8 C)  TempSrc: Oral Oral Oral Oral  SpO2: 95% 94% 95% 95%  Weight:      Height:        General: Pt is alert, awake, not in acute distress Cardiovascular: RRR, S1/S2 +, no rubs, no gallops Respiratory: CTA bilaterally, no wheezing, no rhonchi Abdominal: Soft, NT, ND, bowel sounds + Extremities: no edema, no cyanosis    The results of significant diagnostics from this hospitalization (including imaging, microbiology, ancillary and laboratory) are listed below for reference.     Microbiology: No results found for this or any previous visit (from the past 240 hour(s)).   Labs: BNP (last 3 results) No results for input(s): "BNP" in the last 8760 hours. Basic Metabolic Panel: Recent Labs  Lab 07/27/21 1304 07/28/21 0911  NA 136  --   K 4.8   --   CL 109  --   CO2 22  --   GLUCOSE 161*  --   BUN 17  --   CREATININE 1.35* 1.19*  CALCIUM 9.8  --    Liver Function Tests: No results for input(s): "AST", "ALT", "ALKPHOS", "BILITOT", "PROT", "ALBUMIN" in the last 168 hours. No results for input(s): "LIPASE", "AMYLASE" in the last 168 hours. No results for input(s): "AMMONIA" in the last 168 hours. CBC: Recent Labs  Lab 07/27/21 1304  WBC 9.3  HGB 14.1  HCT 43.6  MCV 87.6  PLT 249   Cardiac Enzymes: No results for input(s): "CKTOTAL", "CKMB", "CKMBINDEX", "TROPONINI" in the last 168 hours. BNP: Invalid input(s): "POCBNP" CBG: Recent Labs  Lab 07/27/21 2024 07/28/21 0054 07/28/21 0606 07/28/21 0750 07/28/21 1147  GLUCAP 131* 119* 138* 126* 156*   D-Dimer No results for input(s): "DDIMER" in the last 72 hours. Hgb A1c No results for input(s): "HGBA1C" in the last 72 hours. Lipid Profile No results for input(s): "CHOL", "HDL", "LDLCALC", "TRIG", "CHOLHDL", "LDLDIRECT" in the last 72 hours. Thyroid function studies No results for input(s): "TSH", "T4TOTAL", "T3FREE", "THYROIDAB" in the last 72 hours.  Invalid input(s): "FREET3" Anemia work up No results for input(s): "VITAMINB12", "FOLATE", "FERRITIN", "TIBC", "IRON", "RETICCTPCT" in the last 72 hours. Urinalysis    Component Value Date/Time   COLORURINE YELLOW (A) 07/27/2021 1305   APPEARANCEUR HAZY (A) 07/27/2021 1305   LABSPEC 1.017 07/27/2021 1305   PHURINE 5.0 07/27/2021 1305   GLUCOSEU NEGATIVE 07/27/2021 1305   HGBUR NEGATIVE 07/27/2021 1305   BILIRUBINUR NEGATIVE 07/27/2021 1305   KETONESUR 5 (A) 07/27/2021 1305   PROTEINUR 100 (A) 07/27/2021 1305   NITRITE NEGATIVE 07/27/2021 1305   LEUKOCYTESUR SMALL (A) 07/27/2021 1305   Sepsis Labs Recent Labs  Lab 07/27/21 1304  WBC 9.3   Microbiology No results found for this or any previous visit (from the past 240 hour(s)).   Time coordinating discharge: Over 30 minutes  SIGNED:   Lynn Ito, MD  [facility] 07/28/2021, 12:46  PM Pager   If 7PM-7AM, please contact night-coverage [email or web address] Password [email or web address]

## 2021-07-28 NOTE — Progress Notes (Signed)
Patient is being discharged home. Discharge papers given and explained to patient.  She verbalized understanding. Meds and f/u appointment reviewed. Rx was sent electronically to the pharmacy. Patient made aware.

## 2021-07-29 ENCOUNTER — Other Ambulatory Visit: Payer: Self-pay

## 2021-07-29 ENCOUNTER — Encounter: Payer: Self-pay | Admitting: Emergency Medicine

## 2021-07-29 ENCOUNTER — Emergency Department
Admission: EM | Admit: 2021-07-29 | Discharge: 2021-07-29 | Disposition: A | Discharge status: Home / Self Care | Payer: Medicare PPO | Attending: Emergency Medicine | Admitting: Emergency Medicine

## 2021-07-29 DIAGNOSIS — E119 Type 2 diabetes mellitus without complications: Secondary | ICD-10-CM | POA: Diagnosis not present

## 2021-07-29 DIAGNOSIS — G43109 Migraine with aura, not intractable, without status migrainosus: Secondary | ICD-10-CM | POA: Diagnosis not present

## 2021-07-29 DIAGNOSIS — R519 Headache, unspecified: Secondary | ICD-10-CM | POA: Diagnosis present

## 2021-07-29 DIAGNOSIS — G43101 Migraine with aura, not intractable, with status migrainosus: Secondary | ICD-10-CM | POA: Insufficient documentation

## 2021-07-29 LAB — URINE CULTURE: Culture: NO GROWTH

## 2021-07-29 LAB — COMPREHENSIVE METABOLIC PANEL
ALT: 26 U/L (ref 0–44)
AST: 22 U/L (ref 15–41)
Albumin: 4.3 g/dL (ref 3.5–5.0)
Alkaline Phosphatase: 26 U/L — ABNORMAL LOW (ref 38–126)
Anion gap: 4 — ABNORMAL LOW (ref 5–15)
BUN: 18 mg/dL (ref 8–23)
CO2: 24 mmol/L (ref 22–32)
Calcium: 9.8 mg/dL (ref 8.9–10.3)
Chloride: 111 mmol/L (ref 98–111)
Creatinine, Ser: 1.06 mg/dL — ABNORMAL HIGH (ref 0.44–1.00)
GFR, Estimated: 58 mL/min — ABNORMAL LOW (ref >60)
Glucose, Bld: 146 mg/dL — ABNORMAL HIGH (ref 70–99)
Potassium: 4.2 mmol/L (ref 3.5–5.1)
Sodium: 139 mmol/L (ref 135–145)
Total Bilirubin: 0.2 mg/dL — ABNORMAL LOW (ref 0.3–1.2)
Total Protein: 6.9 g/dL (ref 6.5–8.1)

## 2021-07-29 LAB — CBC
HCT: 41.5 % (ref 36.0–46.0)
Hemoglobin: 13.4 g/dL (ref 12.0–15.0)
MCH: 28.4 pg (ref 26.0–34.0)
MCHC: 32.3 g/dL (ref 30.0–36.0)
MCV: 87.9 fL (ref 80.0–100.0)
Platelets: 217 10*3/uL (ref 150–400)
RBC: 4.72 MIL/uL (ref 3.87–5.11)
RDW: 12.1 % (ref 11.5–15.5)
WBC: 7.4 10*3/uL (ref 4.0–10.5)
nRBC: 0 % (ref 0.0–0.2)

## 2021-07-29 LAB — LIPASE, BLOOD: Lipase: 70 U/L — ABNORMAL HIGH (ref 11–51)

## 2021-07-29 MED ORDER — DIPHENHYDRAMINE HCL 50 MG/ML IJ SOLN
25.0000 mg | Freq: Once | INTRAMUSCULAR | Status: AC
  Administered 2021-07-29: 25 mg via INTRAVENOUS
  Filled 2021-07-29: qty 1

## 2021-07-29 MED ORDER — METOCLOPRAMIDE HCL 5 MG/ML IJ SOLN
10.0000 mg | INTRAMUSCULAR | Status: AC
  Administered 2021-07-29: 10 mg via INTRAVENOUS
  Filled 2021-07-29: qty 2

## 2021-07-29 MED ORDER — SODIUM CHLORIDE 0.9 % IV BOLUS
1000.0000 mL | Freq: Once | INTRAVENOUS | Status: AC
Start: 2021-07-29 — End: 2021-07-29
  Administered 2021-07-29: 1000 mL via INTRAVENOUS

## 2021-07-29 MED ORDER — KETOROLAC TROMETHAMINE 15 MG/ML IJ SOLN
15.0000 mg | Freq: Once | INTRAMUSCULAR | Status: AC
  Administered 2021-07-29: 15 mg via INTRAVENOUS
  Filled 2021-07-29: qty 1

## 2021-07-29 NOTE — ED Provider Notes (Signed)
[facility] Provider Note    Event Date/Time   First MD Initiated Contact with Patient 07/29/21 1423     (approximate)   History   Chief Complaint: Nausea, Emesis, and Headache   HPI  Marilyn Davis is a 66 y.o. female with a past history of bipolar disorder, diabetes, chronic pain syndrome, migraine headaches who comes the ED complaining of left retro-orbital headache this morning associated with nausea and vomiting, with preceding scintillating aura, feels very typical for her migraine headache syndrome.  Reports that she tried taking her abortive medication but vomited instead.  No fever, no trauma neck pain or stiffness.  No lateralizing weakness or paresthesia.     Physical Exam   Triage Vital Signs: ED Triage Vitals  Enc Vitals Group     BP 07/29/21 1350 130/74     Pulse Rate 07/29/21 1350 87     Resp 07/29/21 1350 18     Temp 07/29/21 1350 98.3 F (36.8 C)     Temp Source 07/29/21 1350 Oral     SpO2 07/29/21 1350 98 %     Weight 07/29/21 1342 218 lb 4.1 oz (99 kg)     Height 07/29/21 1342 5\' 5"  (1.651 m)     Head Circumference --      Peak Flow --      Pain Score 07/29/21 1342 10     Pain Loc --      Pain Edu? --      Excl. in [place]? --     Most recent vital signs: Vitals:   07/29/21 1350  BP: 130/74  Pulse: 87  Resp: 18  Temp: 98.3 F (36.8 C)  SpO2: 98%    General: Awake, no distress.  CV:  Good peripheral perfusion.  Resp:  Normal effort.  Abd:  No distention.  Other:  Moist oral mucosa.  No nystagmus, EOMI, PERRL   ED Results / Procedures / Treatments   Labs (all labs ordered are listed, but only abnormal results are displayed) Labs Reviewed  LIPASE, BLOOD - Abnormal; Notable for the following components:      Result Value   Lipase 70 (*)    All other components within normal limits  COMPREHENSIVE METABOLIC PANEL - Abnormal; Notable for the following components:   Glucose, Bld 146 (*)    Creatinine, Ser 1.06  (*)    Alkaline Phosphatase 26 (*)    Total Bilirubin 0.2 (*)    GFR, Estimated 58 (*)    Anion gap 4 (*)    All other components within normal limits  CBC     EKG    RADIOLOGY    PROCEDURES:  Procedures   MEDICATIONS ORDERED IN ED: Medications  sodium chloride 0.9 % bolus 1,000 mL (1,000 mLs Intravenous New Bag/Given 07/29/21 1521)  ketorolac (TORADOL) 15 MG/ML injection 15 mg (15 mg Intravenous Given 07/29/21 1530)  diphenhydrAMINE (BENADRYL) injection 25 mg (25 mg Intravenous Given 07/29/21 1530)  metoCLOPramide (REGLAN) injection 10 mg (10 mg Intravenous Given 07/29/21 1531)     IMPRESSION / MDM / ASSESSMENT AND PLAN / ED COURSE  I reviewed the triage vital signs and the nursing notes.                                Patient's presentation is most consistent with exacerbation of chronic illness.  Patient presents with classical migraine headache. Considering the patient's symptoms, medical  history, and physical examination today, I have low suspicion for ischemic stroke, intracranial hemorrhage, meningitis, encephalitis, carotid or vertebral dissection, venous sinus thrombosis, MS, intracranial hypertension, glaucoma, CRAO, CRVO, or temporal arteritis.  We will give IV fluids, Toradol Benadryl Reglan for symptom relief.       FINAL CLINICAL IMPRESSION(S) / ED DIAGNOSES   Final diagnoses:  Migraine with aura and with status migrainosus, not intractable     Rx / DC Orders   ED Discharge Orders     None        Note:  This document was prepared using Dragon voice recognition software and may include unintentional dictation errors.   Carrie Mew, MD 07/29/21 [phone]

## 2021-07-29 NOTE — ED Notes (Signed)
E-signature pad not functional upon discharge.

## 2021-07-29 NOTE — ED Triage Notes (Signed)
Pt reports was seen here 2 days ago and admitted for NV and dehydration. Pt reports when she woke up this morning she started with NV again and now has a migraine HA.

## 2021-07-30 DIAGNOSIS — G43009 Migraine without aura, not intractable, without status migrainosus: Secondary | ICD-10-CM | POA: Diagnosis not present

## 2021-07-30 DIAGNOSIS — E782 Mixed hyperlipidemia: Secondary | ICD-10-CM | POA: Diagnosis not present

## 2021-07-30 DIAGNOSIS — E1121 Type 2 diabetes mellitus with diabetic nephropathy: Secondary | ICD-10-CM | POA: Diagnosis not present

## 2021-07-30 DIAGNOSIS — I10 Essential (primary) hypertension: Secondary | ICD-10-CM | POA: Diagnosis not present

## 2021-07-30 DIAGNOSIS — N390 Urinary tract infection, site not specified: Secondary | ICD-10-CM | POA: Diagnosis not present

## 2021-07-31 ENCOUNTER — Ambulatory Visit (INDEPENDENT_AMBULATORY_CARE_PROVIDER_SITE_OTHER): Payer: Medicare PPO | Admitting: Psychiatry

## 2021-07-31 ENCOUNTER — Encounter: Payer: Self-pay | Admitting: Psychiatry

## 2021-07-31 VITALS — BP 115/76 | HR 86 | Temp 97.8°F | Wt 217.0 lb

## 2021-07-31 DIAGNOSIS — F603 Borderline personality disorder: Secondary | ICD-10-CM

## 2021-07-31 DIAGNOSIS — Z79899 Other long term (current) drug therapy: Secondary | ICD-10-CM | POA: Diagnosis not present

## 2021-07-31 DIAGNOSIS — F840 Autistic disorder: Secondary | ICD-10-CM | POA: Diagnosis not present

## 2021-07-31 DIAGNOSIS — T50905A Adverse effect of unspecified drugs, medicaments and biological substances, initial encounter: Secondary | ICD-10-CM

## 2021-07-31 DIAGNOSIS — Z8659 Personal history of other mental and behavioral disorders: Secondary | ICD-10-CM

## 2021-07-31 DIAGNOSIS — F3174 Bipolar disorder, in full remission, most recent episode manic: Secondary | ICD-10-CM

## 2021-07-31 DIAGNOSIS — M5431 Sciatica, right side: Secondary | ICD-10-CM | POA: Insufficient documentation

## 2021-07-31 MED ORDER — OLANZAPINE 2.5 MG PO TABS: 2.5000 mg | ORAL_TABLET | Freq: Every day | ORAL | 0 refills | Status: DC

## 2021-07-31 MED ORDER — QUETIAPINE FUMARATE 25 MG PO TABS: 25.0000 mg | ORAL_TABLET | ORAL | 0 refills | Status: DC

## 2021-07-31 MED ORDER — TRAZODONE HCL 100 MG PO TABS
200.0000 mg | ORAL_TABLET | Freq: Every day | ORAL | 0 refills | Status: DC
Start: 2021-07-31 — End: 2021-08-07

## 2021-07-31 NOTE — Progress Notes (Deleted)
err

## 2021-07-31 NOTE — Patient Instructions (Signed)
Changes made today   Start taking Seroquel 25 mg daily AM and 75 mg daily at bedtime.  Start taking lower dose of olanzapine - 2.5 mg daily at bedtime. Stop 5 mg .  Start taking a lower dose of Trazodone - 200 mg at bedtime. Dose change .
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
## 2021-07-31 NOTE — Progress Notes (Deleted)
ERR

## 2021-08-02 ENCOUNTER — Other Ambulatory Visit
Admission: RE | Admit: 2021-08-02 | Discharge: 2021-08-02 | Disposition: A | Discharge status: Home / Self Care | Payer: Medicare PPO | Attending: Psychiatry | Admitting: Psychiatry

## 2021-08-02 DIAGNOSIS — F3174 Bipolar disorder, in full remission, most recent episode manic: Secondary | ICD-10-CM | POA: Insufficient documentation

## 2021-08-02 DIAGNOSIS — Z79899 Other long term (current) drug therapy: Secondary | ICD-10-CM | POA: Diagnosis not present

## 2021-08-02 LAB — TSH: TSH: 1.231 u[IU]/mL (ref 0.350–4.500)

## 2021-08-03 LAB — PROLACTIN: Prolactin: 17.2 ng/mL (ref 4.8–23.3)

## 2021-08-07 ENCOUNTER — Encounter: Payer: Self-pay | Admitting: Psychiatry

## 2021-08-07 ENCOUNTER — Ambulatory Visit (INDEPENDENT_AMBULATORY_CARE_PROVIDER_SITE_OTHER): Payer: Medicare PPO | Admitting: Psychiatry

## 2021-08-07 VITALS — BP 114/74 | HR 84 | Temp 97.6°F | Ht 65.25 in | Wt 213.0 lb

## 2021-08-07 DIAGNOSIS — Z8659 Personal history of other mental and behavioral disorders: Secondary | ICD-10-CM | POA: Diagnosis not present

## 2021-08-07 DIAGNOSIS — Z79899 Other long term (current) drug therapy: Secondary | ICD-10-CM | POA: Diagnosis not present

## 2021-08-07 DIAGNOSIS — F840 Autistic disorder: Secondary | ICD-10-CM

## 2021-08-07 DIAGNOSIS — F603 Borderline personality disorder: Secondary | ICD-10-CM

## 2021-08-07 DIAGNOSIS — F3174 Bipolar disorder, in full remission, most recent episode manic: Secondary | ICD-10-CM

## 2021-08-07 DIAGNOSIS — T50905D Adverse effect of unspecified drugs, medicaments and biological substances, subsequent encounter: Secondary | ICD-10-CM | POA: Diagnosis not present

## 2021-08-07 MED ORDER — BUPROPION HCL ER (XL) 150 MG PO TB24: 150.0000 mg | ORAL_TABLET | Freq: Every day | ORAL | 0 refills | Status: DC

## 2021-08-07 MED ORDER — MIRTAZAPINE 15 MG PO TABS: 15.0000 mg | ORAL_TABLET | Freq: Every day | ORAL | 0 refills | Status: DC

## 2021-08-07 MED ORDER — QUETIAPINE FUMARATE 25 MG PO TABS: 25.0000 mg | ORAL_TABLET | ORAL | 1 refills | Status: DC

## 2021-08-07 MED ORDER — TRAZODONE HCL 100 MG PO TABS: 100.0000 mg | ORAL_TABLET | Freq: Every evening | ORAL | 0 refills | Status: DC | PRN

## 2021-08-07 MED ORDER — LAMOTRIGINE 200 MG PO TABS: 200.0000 mg | ORAL_TABLET | Freq: Every day | ORAL | 0 refills | Status: DC

## 2021-08-07 NOTE — Progress Notes (Unsigned)
BH MD  OP Progress Note  08/07/2021 3:06 PM Marilyn Davis  MRN:  [id number]  Chief Complaint:  Chief Complaint  Patient presents with   Follow-up: 66 year old Caucasian female, married, retired, lives in [place], with recent dosage readjustment of her medications presented for medication management.   HPI: Marilyn Davis is a 66 year old Caucasian female who has a history of bipolar disorder, probable borderline personality disorder, multiple other diagnoses including medical problems like diabetes melitis, sciatica, dizziness, tremors, presented for medication management.  Patient presented with her spouse Dayton Scrape who provided collateral information.  Patient appeared to be alert, oriented to person place time situation.  Patient reports she is tolerating the lower dosage of olanzapine.  She also accidentally started taking an overdose of trazodone and since the past few days has been taking only 100 mg and took 200 mg.  Patient reports she feels like her thinking is clearer.  She continues to have dizziness, continues to need to use a walker.  She continues to be worried about falls.  Reports sleep as good.  Denies any significant mood lability, irritability or anxiety symptoms.  Denies suicidality, homicidality or perceptual disturbances.  As per spouse patient is currently tolerating the dosage readjustment.  Patient as well as spouse agreeable to further medication changes.  Patient is currently on polypharmacy which likely also could be contributing to dizziness.    Visit Diagnosis:    ICD-10-CM   1. Bipolar 1 disorder, manic, full remission (HCC)  F31.74 traZODone (DESYREL) 100 MG tablet    QUEtiapine (SEROQUEL) 25 MG tablet    mirtazapine (REMERON) 15 MG tablet    lamoTRIgine (LAMICTAL) 200 MG tablet    buPROPion (WELLBUTRIN XL) 150 MG 24 hr tablet    2. Adverse effect of drug, subsequent encounter  T50.905D    psychotropics - zyprexa     3. Borderline  personality disorder (HCC)  F60.3 traZODone (DESYREL) 100 MG tablet    QUEtiapine (SEROQUEL) 25 MG tablet    4. History of autism spectrum disorder  F84.0     5. History of posttraumatic stress disorder (PTSD)  Z86.59     6. High risk medication use  Z79.899       Past Psychiatric History: Reviewed past psychiatric history from progress note on 07/31/2021.  Past Medical History:  Past Medical History:  Diagnosis Date   Bipolar disorder (HCC)    Diabetes mellitus without complication (HCC)    Sciatica     Past Surgical History:  Procedure Laterality Date   ABDOMINAL HYSTERECTOMY     BREAST BIOPSY Right 09/30/2019   9:00, 5cmfn, Q shape, path pending   BREAST BIOPSY Right 09/30/2019   9:00, 9cmfn, vision, path pending   EXPLORATORY LAPAROTOMY     SHOULDER SURGERY     TONSILLECTOMY      Family Psychiatric History: Reviewed family psychiatric history from progress note on 07/31/2021.  Family History:  Family History  Problem Relation Age of Onset   Personality disorder Mother    Bipolar disorder Father    Alcohol abuse Brother    Drug abuse Brother    Suicidality Other    Breast cancer Neg Hx     Social History: Reviewed social history from progress note on 07/31/2021. Social History   Socioeconomic History   Marital status: Married    Spouse name: johnnie   Number of children: 2   Years of education: Not on file   Highest education level: Some college, no degree  Occupational  History   Not on file  Tobacco Use   Smoking status: Never   Smokeless tobacco: Never  Vaping Use   Vaping Use: Never used  Substance and Sexual Activity   Alcohol use: Never   Drug use: Never   Sexual activity: Not Currently  Other Topics Concern   Not on file  Social History Narrative   Not on file   Social Determinants of Health   Financial Resource Strain: Not on file  Food Insecurity: Not on file  Transportation Needs: Not on file  Physical Activity: Not on file  Stress:  Not on file  Social Connections: Not on file    Allergies:  Allergies  Allergen Reactions   Pseudoephedrine Hcl Other (See Comments)    manic Other reaction(s): Hallucinations   Cabbage Nausea And Vomiting    Cooked    Ibuprofen Itching    Metabolic Disorder Labs: Lab Results  Component Value Date   HGBA1C 6.2 (H) 03/13/2019   MPG 131.24 03/13/2019   Lab Results  Component Value Date   PROLACTIN 17.2 08/02/2021   No results found for: "CHOL", "TRIG", "HDL", "CHOLHDL", "VLDL", "LDLCALC" Lab Results  Component Value Date   TSH 1.231 08/02/2021   TSH 1.178 03/13/2019    Therapeutic Level Labs: No results found for: "LITHIUM" No results found for: "VALPROATE" No results found for: "CBMZ"  Current Medications: Current Outpatient Medications  Medication Sig Dispense Refill   alendronate (FOSAMAX) 70 MG tablet Take 70 mg by mouth once a week.     atorvastatin (LIPITOR) 10 MG tablet Take 1 tablet (10 mg total) by mouth daily at 6 PM. 30 tablet 1   fluticasone (FLONASE) 50 MCG/ACT nasal spray Place 2 sprays into both nostrils daily.     gabapentin (NEURONTIN) 300 MG capsule Take 300 mg by mouth daily.     insulin glargine (LANTUS) 100 UNIT/ML Solostar Pen Inject 32 Units into the skin at bedtime. 15 mL 6   linaclotide (LINZESS) 145 MCG CAPS capsule Take 145 mcg by mouth daily.     meclizine (ANTIVERT) 25 MG tablet Take 1 tablet (25 mg total) by mouth 3 (three) times daily. 30 tablet 0   montelukast (SINGULAIR) 10 MG tablet Take 1 tablet (10 mg total) by mouth at bedtime. 30 tablet 1   pantoprazole (PROTONIX) 40 MG tablet Take 40 mg by mouth daily.     RYBELSUS 3 MG TABS Take 3 mg by mouth daily.     traMADol (ULTRAM) 50 MG tablet Take 50 mg by mouth in the morning, at noon, and at bedtime.     buPROPion (WELLBUTRIN XL) 150 MG 24 hr tablet Take 1 tablet (150 mg total) by mouth daily. 90 tablet 0   lamoTRIgine (LAMICTAL) 200 MG tablet Take 1 tablet (200 mg total) by mouth at  bedtime. 90 tablet 0   mirtazapine (REMERON) 15 MG tablet Take 1 tablet (15 mg total) by mouth at bedtime. 90 tablet 0   QUEtiapine (SEROQUEL) 25 MG tablet Take 1-4 tablets (25-100 mg total) by mouth as directed. Take 1 tablet daily AM and 4 tablets daily at bedtime 150 tablet 1   traZODone (DESYREL) 100 MG tablet Take 1-2 tablets (100-200 mg total) by mouth at bedtime as needed for sleep. 60 tablet 0   No current facility-administered medications for this visit.     Musculoskeletal: Strength & Muscle Tone: within normal limits Gait & Station:  slow walks with walker Patient leans: Front  Psychiatric Specialty Exam: Review  of Systems  Neurological:  Positive for dizziness and tremors.  Psychiatric/Behavioral: Negative.    All other systems reviewed and are negative.   Blood pressure 114/74, pulse 84, temperature 97.6 F (36.4 C), height 5' 5.25" (1.657 m), weight 213 lb (96.6 kg), SpO2 96 %.Body mass index is 35.17 kg/m.  General Appearance: Casual  Eye Contact:  Fair  Speech:  Normal Rate  Volume:  Normal  Mood:  Euthymic  Affect:  Flat  Thought Process:  Goal Directed and Descriptions of Associations: Intact  Orientation:  Full (Time, Place, and Person)  Thought Content: Logical   Suicidal Thoughts:  No  Homicidal Thoughts:  No  Memory:  Immediate;   Fair Recent;   Fair Remote;   Fair  Judgement:  Fair  Insight:  Fair  Psychomotor Activity:  Decreased  Concentration:  Concentration: Fair and Attention Span: Fair  Recall:  [employer] of Knowledge: Fair  Language: Fair  Akathisia:  No  Handed:  Ambidextrous  AIMS (if indicated): done  Assets:  Communication Skills Desire for Improvement Housing Intimacy Social Support  ADL's:  Intact  Cognition: WNL  Sleep:  Fair   Screenings: AIMS    Flowsheet Row Office Visit from 08/07/2021 in [facility] Office Visit from 07/31/2021 in [facility] Admission  (Discharged) from 03/14/2019 in [facility]  AIMS Total Score 0 0 0      AUDIT    Flowsheet Row Admission (Discharged) from 03/14/2019 in [facility] MEDICINE  Alcohol Use Disorder Identification Test Final Score (AUDIT) 0      GAD-7    Flowsheet Row Office Visit from 07/31/2021 in [facility]  Total GAD-7 Score 3      PHQ2-9    Flowsheet Row Office Visit from 08/07/2021 in [facility] Office Visit from 07/31/2021 in [facility]  PHQ-2 Total Score 0 0      Flowsheet Row Office Visit from 08/07/2021 in [facility] Office Visit from 07/31/2021 in [facility] ED to Hosp-Admission (Discharged) from 07/27/2021 in [facility] 1C MEDICAL TELEMETRY  C-SSRS RISK CATEGORY No Risk No Risk No Risk        Assessment and Plan: ARGUSTA MCGANN is a 66 year old Caucasian female who has a history of bipolar disorder, probable borderline personality disorder, multiple other diagnoses including medical problems like diabetes mellitus, sciatica, dizziness, tremors, presented for medication management.  Patient is currently tolerating being tapered off of olanzapine as well as dosage readjustment of Seroquel.  Patient with adverse side effects of dizziness and tremors likely due to multiple psychotropic medication including being on 2 antipsychotics-Seroquel and olanzapine.  Discussed plan as noted below.  Plan Bipolar disorder in remission Will discontinue olanzapine. Increase Seroquel to 25 mg p.o. daily in the morning and 100 mg at bedtime. Mirtazapine 15 mg at bedtime Reduce trazodone to 100-200 mg p.o. nightly as needed Lamotrigine 200 mg p.o. daily Wellbutrin XL 150 mg p.o. daily She is also on gabapentin which is prescribed for pain.  Adverse side effects to  medication-polypharmacy-unstable Will discontinue olanzapine. Trazodone dosage also has been readjusted.  As noted above. Will monitor patient and will continue to make further medication changes.  Borderline personality disorder-stable Will monitor closely  History of autism spectrum disorder/history of PTSD-pending medical records.  High risk medication use-reviewed labs-TSH-08/02/2021-within normal limits, prolactin level-within normal limits.  Pending lipid panel.  Collateral information obtained from  spouse-Joni who reports patient as tolerating the current dosage changes of her medications.  Follow-up in clinic in 3 weeks or sooner if needed.   This note was generated in part or whole with voice recognition software. Voice recognition is usually quite accurate but there are transcription errors that can and very often do occur. I apologize for any typographical errors that were not detected and corrected.     Jomarie Longs, MD 08/07/2021, 3:06 PM

## 2021-08-08 ENCOUNTER — Telehealth: Payer: Self-pay | Admitting: Psychiatry

## 2021-08-08 ENCOUNTER — Telehealth: Payer: Self-pay

## 2021-08-08 MED ORDER — TRAZODONE HCL 100 MG PO TABS: 100.0000 mg | ORAL_TABLET | Freq: Every evening | ORAL | 0 refills | Status: DC | PRN

## 2021-08-08 NOTE — Telephone Encounter (Signed)
Patient did not want Trazodone sent as per discussion yesterday , she is supposed to use a lower dose and it was changed in the system to show that change. Did patient ask for a refill after she left ?

## 2021-08-08 NOTE — Telephone Encounter (Signed)
Reviewed labs received from primary care provider-dated 06/29/2021-CBC with differential-within normal limits, CMP-ALT-elevated at 36, creatinine high at 1.46, glucose-elevated at 135-otherwise within normal limits Lipid panel-triglycerides elevated at 231, HDL low at 39 otherwise within normal limits  Hemoglobin A1c-elevated at 6.5.  Patient to continue to follow up with primary care provider.

## 2021-08-08 NOTE — Telephone Encounter (Signed)
Please resend the rx for the Trazodone it was not sent:   traZODone (DESYREL) 100 MG tablet Medication Date: 08/07/2021 Department: [facility] Ordering/Authorizing: Jomarie Longs, MD   Order Providers  Prescribing Provider Encounter Provider  Jomarie Longs, MD Jomarie Longs, MD   Outpatient Medication Detail   Disp Refills Start End   traZODone (DESYREL) 100 MG tablet 60 tablet 0 08/07/2021    Sig - Route: Take 1-2 tablets (100-200 mg total) by mouth at bedtime as needed for sleep. - Oral   Class: No Print

## 2021-08-13 DIAGNOSIS — R413 Other amnesia: Secondary | ICD-10-CM | POA: Diagnosis not present

## 2021-08-13 DIAGNOSIS — R42 Dizziness and giddiness: Secondary | ICD-10-CM | POA: Diagnosis not present

## 2021-08-13 DIAGNOSIS — R251 Tremor, unspecified: Secondary | ICD-10-CM | POA: Diagnosis not present

## 2021-08-14 ENCOUNTER — Telehealth: Payer: Self-pay

## 2021-08-14 NOTE — Telephone Encounter (Signed)
quetiapine fumarate 25mg  was approved until 02-17-22

## 2021-08-19 ENCOUNTER — Other Ambulatory Visit: Payer: Self-pay | Admitting: Psychiatry

## 2021-08-27 ENCOUNTER — Other Ambulatory Visit: Payer: Self-pay | Admitting: Psychiatry

## 2021-08-27 DIAGNOSIS — F603 Borderline personality disorder: Secondary | ICD-10-CM

## 2021-08-27 DIAGNOSIS — F3174 Bipolar disorder, in full remission, most recent episode manic: Secondary | ICD-10-CM

## 2021-08-28 DIAGNOSIS — E1142 Type 2 diabetes mellitus with diabetic polyneuropathy: Secondary | ICD-10-CM | POA: Insufficient documentation

## 2021-08-28 DIAGNOSIS — E1169 Type 2 diabetes mellitus with other specified complication: Secondary | ICD-10-CM | POA: Insufficient documentation

## 2021-08-29 ENCOUNTER — Ambulatory Visit (INDEPENDENT_AMBULATORY_CARE_PROVIDER_SITE_OTHER): Payer: Medicare PPO | Admitting: Psychiatry

## 2021-08-29 ENCOUNTER — Encounter: Payer: Self-pay | Admitting: Psychiatry

## 2021-08-29 VITALS — BP 137/73 | HR 79 | Temp 98.3°F | Wt 215.2 lb

## 2021-08-29 DIAGNOSIS — R413 Other amnesia: Secondary | ICD-10-CM

## 2021-08-29 DIAGNOSIS — F3174 Bipolar disorder, in full remission, most recent episode manic: Secondary | ICD-10-CM | POA: Diagnosis not present

## 2021-08-29 DIAGNOSIS — T50905D Adverse effect of unspecified drugs, medicaments and biological substances, subsequent encounter: Secondary | ICD-10-CM

## 2021-08-29 DIAGNOSIS — F603 Borderline personality disorder: Secondary | ICD-10-CM

## 2021-08-29 DIAGNOSIS — F840 Autistic disorder: Secondary | ICD-10-CM

## 2021-08-29 DIAGNOSIS — Z8659 Personal history of other mental and behavioral disorders: Secondary | ICD-10-CM

## 2021-08-29 NOTE — Progress Notes (Unsigned)
BH MD OP Progress Note  08/29/2021 6:01 PM Marilyn Davis  MRN:  [id number]  Chief Complaint:  Chief Complaint  Patient presents with   Follow-up: 66 year old Caucasian female, with history of bipolar disorder, borderline personality disorder, multiple medical problems, presented for medication management.   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  Patient today appeared to be alert, oriented to person place time situation.  She appeared to be pleasant and cooperative.  Patient today reports she does not feel depressed however may have noticed mild changes in her motivation to take care of herself like take a shower and also some appetite reduction.  She however has been forcing herself to do so.  She make sure she eats at least 3 meals per day.  She also is in physical therapy for her balance problems.  Patient reports she has been doing her exercises and taking walks when the weather permits.  Patient completed a PHQ-9 today and scored 0 on the same.  Although she does report the above symptoms it does not look like she is depressed.  Patient also observed in session as better than her previous visits, did not appear to be sluggish, did not have any psychomotor retardation.  Patient reports she is currently sleeping well.  She tried reducing the trazodone to 100 mg however she could not sleep after a few days.  She hence went back to the 200 mg of trazodone which she takes along with the Seroquel and mirtazapine at night.  Patient reports she does not have any suicidality, homicidality or perceptual disturbances.  Her spouse continues to be supportive.  Continues to have mild dizziness although she does not need a walker anymore and she is making  progress with that.  Patient had neurology appointment for memory problems, I have reviewed notes per Ms. Maralyn Sago Mason-dated 08/13/2021.  Patient referred to vestibular rehab for BPPV, carotid ultrasound to evaluate dizziness.  Last memory evaluation-25 out of 30.  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 Bipolar 1 disorder, manic, full remission (HCC)  F31.74     2. Adverse effect of drug, subsequent encounter  T50.905D     3. Borderline personality disorder (HCC)  F60.3     4. Memory loss  R41.3     5. History of posttraumatic stress disorder (PTSD)  Z86.59     6. History of autism spectrum disorder  F84.0       Past Psychiatric History: Reviewed past psychiatric history from progress note on 07/31/2021.  Past Medical History:  Past Medical History:  Diagnosis Date   Bipolar disorder (HCC)    Diabetes mellitus without complication (HCC)    Sciatica     Past Surgical History:  Procedure Laterality Date   ABDOMINAL HYSTERECTOMY     BREAST BIOPSY Right 09/30/2019   9:00, 5cmfn, Q shape, path pending   BREAST BIOPSY Right 09/30/2019   9:00, 9cmfn, vision, path pending   EXPLORATORY LAPAROTOMY     SHOULDER SURGERY     TONSILLECTOMY      Family Psychiatric History: Reviewed family psychiatric history from progress note on 07/31/2021.  Family History:  Family History  Problem Relation Age of Onset   Personality disorder Mother    Bipolar disorder Father  Alcohol abuse Brother    Drug abuse Brother    Suicidality Other    Breast cancer Neg Hx     Social History: Reviewed social history from progress note on 07/31/2021. Social History   Socioeconomic History   Marital status: Married    Spouse name: johnnie   Number of children: 2   Years of education: Not on file   Highest education level: Some college, no degree  Occupational History   Not on file  Tobacco Use   Smoking status: Never   Smokeless tobacco:  Never  Vaping Use   Vaping Use: Never used  Substance and Sexual Activity   Alcohol use: Never   Drug use: Never   Sexual activity: Not Currently  Other Topics Concern   Not on file  Social History Narrative   Not on file   Social Determinants of Health   Financial Resource Strain: Not on file  Food Insecurity: Not on file  Transportation Needs: Not on file  Physical Activity: Not on file  Stress: Not on file  Social Connections: Not on file    Allergies:  Allergies  Allergen Reactions   Pseudoephedrine Hcl Other (See Comments)    manic Other reaction(s): Hallucinations   Cabbage Nausea And Vomiting    Cooked    Ibuprofen Itching    Metabolic Disorder Labs: Lab Results  Component Value Date   HGBA1C 6.2 (H) 03/13/2019   MPG 131.24 03/13/2019   Lab Results  Component Value Date   PROLACTIN 17.2 08/02/2021   No results found for: "CHOL", "TRIG", "HDL", "CHOLHDL", "VLDL", "LDLCALC" Lab Results  Component Value Date   TSH 1.231 08/02/2021   TSH 1.178 03/13/2019    Therapeutic Level Labs: No results found for: "LITHIUM" No results found for: "VALPROATE" No results found for: "CBMZ"  Current Medications: Current Outpatient Medications  Medication Sig Dispense Refill   alendronate (FOSAMAX) 70 MG tablet Take 70 mg by mouth once a week.     atorvastatin (LIPITOR) 10 MG tablet Take 1 tablet (10 mg total) by mouth daily at 6 PM. 30 tablet 1   buPROPion (WELLBUTRIN XL) 150 MG 24 hr tablet Take 1 tablet (150 mg total) by mouth daily. 90 tablet 0   fluticasone (FLONASE) 50 MCG/ACT nasal spray Place 2 sprays into both nostrils daily.     gabapentin (NEURONTIN) 300 MG capsule Take 300 mg by mouth daily.     insulin glargine (LANTUS) 100 UNIT/ML Solostar Pen Inject 32 Units into the skin at bedtime. 15 mL 6   lamoTRIgine (LAMICTAL) 200 MG tablet Take 1 tablet (200 mg total) by mouth at bedtime. 90 tablet 0   mirtazapine (REMERON) 15 MG tablet Take 1 tablet (15 mg  total) by mouth at bedtime. 90 tablet 0   montelukast (SINGULAIR) 10 MG tablet Take 1 tablet (10 mg total) by mouth at bedtime. 30 tablet 1   pantoprazole (PROTONIX) 40 MG tablet Take 40 mg by mouth daily.     QUEtiapine (SEROQUEL) 25 MG tablet Take 1-4 tablets (25-100 mg total) by mouth as directed. Take 1 tablet daily AM and 4 tablets daily at bedtime 150 tablet 1   traMADol (ULTRAM) 50 MG tablet Take 50 mg by mouth in the morning, at noon, and at bedtime.     traZODone (DESYREL) 100 MG tablet Take 1-2 tablets (100-200 mg total) by mouth at bedtime as needed for sleep. 180 tablet 0   linaclotide (LINZESS) 145 MCG CAPS capsule Take 145 mcg by  mouth daily. (Patient not taking: Reported on 08/29/2021)     RYBELSUS 3 MG TABS Take 3 mg by mouth daily. (Patient not taking: Reported on 08/29/2021)     No current facility-administered medications for this visit.     Musculoskeletal: Strength & Muscle Tone: within normal limits Gait & Station:  walks with help of a cane - improved Patient leans: N/A  Psychiatric Specialty Exam: Review of Systems  Neurological:  Positive for dizziness (improved).  Psychiatric/Behavioral: Negative.    All other systems reviewed and are negative.   Blood pressure 137/73, pulse 79, temperature 98.3 F (36.8 C), temperature source Temporal, weight 215 lb 3.2 oz (97.6 kg).Body mass index is 35.54 kg/m.  General Appearance: Casual  Eye Contact:  Fair  Speech:  Clear and Coherent  Volume:  Normal  Mood:  Euthymic  Affect:  Congruent  Thought Process:  Goal Directed and Descriptions of Associations: Intact  Orientation:  Full (Time, Place, and Person)  Thought Content: Logical   Suicidal Thoughts:  No  Homicidal Thoughts:  No  Memory:  Immediate;   Fair Recent;   Fair Remote;   Fair reports short term memory loss  Judgement:  Fair  Insight:  Fair  Psychomotor Activity:  Normal  Concentration:  Concentration: Fair and Attention Span: Fair  Recall:  [employer] of Knowledge: Fair  Language: Fair  Akathisia:  No  Handed:  Ambidextrous  AIMS (if indicated): done  Assets:  Communication Skills Desire for Improvement Housing Social Support  ADL's:  Intact  Cognition: WNL  Sleep:  Fair   Screenings: AIMS    Flowsheet Row Office Visit from 08/29/2021 in [facility] Office Visit from 08/07/2021 in [facility] Office Visit from 07/31/2021 in [facility] Admission (Discharged) from 03/14/2019 in [facility]  AIMS Total Score 0 0 0 0      AUDIT    Flowsheet Row Admission (Discharged) from 03/14/2019 in [facility]  Alcohol Use Disorder Identification Test Final Score (AUDIT) 0      GAD-7    Flowsheet Row Office Visit from 07/31/2021 in [facility]  Total GAD-7 Score 3      PHQ2-9    Flowsheet Row Office Visit from 08/29/2021 in [facility] Office Visit from 08/07/2021 in [facility] Office Visit from 07/31/2021 in [facility]  PHQ-2 Total Score 0 0 0      Flowsheet Row Office Visit from 08/29/2021 in [facility] Office Visit from 08/07/2021 in [facility] Office Visit from 07/31/2021 in [facility]  C-SSRS RISK CATEGORY No Risk No Risk No Risk        Assessment and Plan: Marilyn Davis is a 66 year old Caucasian female who has a history of bipolar disorder, probable borderline personality disorder, multiple other diagnoses including medical problems like diabetes mellitus, sciatica, dizziness, tremors, presented for medication management.  Patient is currently improving, will benefit from the following plan.  Plan Bipolar disorder in remission Continue Seroquel 25 mg p.o. daily in the morning and 100  mg at bedtime. Mirtazapine 15 mg p.o. nightly. Trazodone 100-200 mg p.o. nightly as needed Lamotrigine 200 mg p.o. daily Wellbutrin XL 150 mg p.o. daily She is also on gabapentin prescribed for pain.  Adverse effect of medication-polypharmacy-improving Will monitor closely.  Patient is currently improved.  Borderline personality disorder-stable Will monitor closely  Memory loss-improving [place]  completed today-26 out of 30.Improving Reviewed notes per neurology-Ms.Huntley Dec Mason-as noted above.  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  Follow-up in clinic in 2 months or sooner if needed.   This note was generated in part or whole with voice recognition software. Voice recognition is usually quite accurate but there are transcription errors that can and very often do occur. I apologize for any typographical errors that were not detected and corrected.      Jomarie Longs, MD 08/30/2021, 2:11 PM

## 2021-08-30 ENCOUNTER — Other Ambulatory Visit: Payer: Self-pay | Admitting: Family Medicine

## 2021-08-30 DIAGNOSIS — Z1231 Encounter for screening mammogram for malignant neoplasm of breast: Secondary | ICD-10-CM

## 2021-09-21 ENCOUNTER — Ambulatory Visit
Admission: RE | Admit: 2021-09-21 | Discharge: 2021-09-21 | Disposition: A | Discharge status: Home / Self Care | Payer: Medicare PPO | Source: Ambulatory Visit | Attending: Family Medicine | Admitting: Family Medicine

## 2021-09-21 DIAGNOSIS — Z1231 Encounter for screening mammogram for malignant neoplasm of breast: Secondary | ICD-10-CM | POA: Diagnosis present

## 2021-09-26 ENCOUNTER — Telehealth: Payer: Self-pay

## 2021-09-26 NOTE — Telephone Encounter (Signed)
quetiapine Fumarate 25mg  was apprvoed until  02-17-2022

## 2021-09-26 NOTE — Telephone Encounter (Signed)
went onlline to [email or web address] and submitted the prior aurth.

## 2021-09-26 NOTE — Telephone Encounter (Signed)
received fax requesting a prior auth on the quetiapine fumarate 25mg  .

## 2021-09-27 ENCOUNTER — Telehealth: Payer: Self-pay

## 2021-09-27 NOTE — Telephone Encounter (Signed)
prior auth for the quetiapine furmarate 25mg  was approved. [place] CASE # approved until 02-17-22

## 2021-10-17 ENCOUNTER — Ambulatory Visit: Payer: Medicare PPO | Attending: Neurology

## 2021-10-17 DIAGNOSIS — R2681 Unsteadiness on feet: Secondary | ICD-10-CM | POA: Diagnosis present

## 2021-10-17 DIAGNOSIS — R42 Dizziness and giddiness: Secondary | ICD-10-CM | POA: Diagnosis present

## 2021-10-17 NOTE — Therapy (Signed)
OUTPATIENT PHYSICAL THERAPY VESTIBULAR EVALUATION     Patient Name: Marilyn Davis MRN: [id number] DOB:07-29-1955, 66 y.o., female Today's Date: 10/17/2021  PCP: No PCP listed in system REFERRING PROVIDER: Morene Crocker, MD   PT End of Session - 10/17/21 1522     Visit Number 1    Number of Visits 25    Date for PT Re-Evaluation 01/09/22    PT Start Time 1016    PT Stop Time 1113    PT Time Calculation (min) 57 min    Activity Tolerance Patient tolerated treatment well    Behavior During Therapy WFL for tasks assessed/performed             Past Medical History:  Diagnosis Date   Bipolar disorder (HCC)    Diabetes mellitus without complication (HCC)    Sciatica    Past Surgical History:  Procedure Laterality Date   ABDOMINAL HYSTERECTOMY     BREAST BIOPSY Right 09/30/2019   9:00, 5cmfn, Q shape, neg   BREAST BIOPSY Right 09/30/2019   9:00, 9cmfn, vision, neg   EXPLORATORY LAPAROTOMY     SHOULDER SURGERY     TONSILLECTOMY     Patient Active Problem List   Diagnosis Date Noted   Sciatica of right side 07/31/2021   Bipolar 1 disorder, manic, full remission (HCC) 07/31/2021   Borderline personality disorder (HCC) 07/31/2021   History of autism spectrum disorder 07/31/2021   History of posttraumatic stress disorder (PTSD) 07/31/2021   Drug reaction 07/31/2021   Uncontrolled type 1 diabetes mellitus with hyperglycemia, with long-term current use of insulin (HCC) 07/27/2021   AKI (acute kidney injury) (HCC) 07/27/2021   UTI (urinary tract infection) 07/27/2021   Dizziness 09/13/2020   Memory loss or impairment 09/13/2020   Tremor 09/13/2020   Migraine headache 01/25/2020   Osteoporosis 10/26/2019   Irritable bowel syndrome 08/24/2019   Bipolar disorder (HCC) 08/03/2019   Esophageal reflux 08/03/2019   Mixed hyperlipidemia 07/11/2019   Type 2 diabetes mellitus with kidney complication, with long-term current use of insulin (HCC) 03/15/2019   Chronic  pain syndrome 03/15/2019   Dyslipidemia 03/15/2019   Bipolar 1 disorder, depressed, severe (HCC) 03/14/2019   CKD (chronic kidney disease), stage III (HCC) 10/16/2018   High risk medication use 05/15/2018    ONSET DATE: Pt reports approximately 1 year ago (August 2022)  REFERRING DIAG: H81.10 (ICD-10-CM) - BPPV (benign paroxysmal positional vertigo)  THERAPY DIAG:  Dizziness and giddiness  Unsteadiness on feet  Rationale for Evaluation and Treatment Rehabilitation  SUBJECTIVE:   SUBJECTIVE STATEMENT: Pt presents to PT eval for dizziness. Presents ambulating with SPC.  Pt accompanied by: self  PERTINENT HISTORY: Pt reports onset of dizziness/unsteadiness about a year ago. Her symptoms started with spinning that lasted 30-45 seconds and occurred with movements such as turning her head, turning, bending over, leaning back. She reports falling 2x because of her symptoms. She had HH therapy during which her vertigo resolved. However, pt states "I still feel off balance. Some days it's really bad. I don't go anywhere or do anything." Pt was told that she has BPPV. She feels like the "world is tipped slightly." Her current symptoms are now more of a feeling of unsteadiness and on bad days she can feel light-headed. 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 Does not have hearing aids. Did not have a cold prior to symptom onset.  Did see ENT without findings, does not report VNG testing. Pt reports she does not have migraines often currently, last one was a month ago. Has not noticed a connection between migraines and dizziness. Pt does taken meclizine, takes 1x/day, and has taken it today. Pt has had sciatica for 6 years felt in R hip. Works with pain management doctor.  Per chart other PMH significant for migraines, memory loss, bipolar disorder, diabetes, chronic pain syndrome, UTI, CKD stage III,  osteoporosis, sciatica R side, borderline personality disorder, history of autism spectrum disorder, history of posttraumatic stress disorder.    PAIN:  Are you having pain? Yes: NPRS scale: 4/10 Pain location: R hip Pain description: radiates from low back into hip, occ down into leg Aggravating factors:   Relieving factors:    PRECAUTIONS: Fall  WEIGHT BEARING RESTRICTIONS No  FALLS: Has patient fallen in last 6 months? Yes. Number of falls 2; reports tends to lose balance to R side  LIVING ENVIRONMENT: Lives with: lives with their family and lives with their spouse Lives in: House/apartment Stairs: No Has following equipment at home: Single point cane, Environmental consultant - 4 wheeled, shower chair, and Grab bars  PLOF: Independent  PATIENT GOALS "To be able to walk without any assistance."  Wants to be ready to walk at the [employer] in October.  OBJECTIVE:   DIAGNOSTIC FINDINGS:   07/27/21 MR BRAIN WO CONTRAST "IMPRESSION: No acute intracranial process. No evidence of acute or subacute infarct."    07/27/21 CT HEAD WO CONTRAST "IMPRESSION: 1. No acute intracranial pathology. 2. Chronic microangiopathy and cerebral atrophy. 3. Chronic right maxillary sinusitis."  [place] 09/24/21: "- Carotid US was wnl. "  COGNITION: Overall cognitive status: Within functional limits for tasks assessed   SENSATION: Did report tingling in her feet when she experienced vertigo, reports lasted duration of vertigo 4-5 months. Was tested for neuropathy found not to have it.  EDEMA:  No swelling.    POSTURE: rounded shoulders   Cervical ROM:  deferred    STRENGTH: deferred, however did require assist throughout for bed mobility with testing on plinth, to go from sidelye>sit>stand, also impacted by dizziness  TRANSFERS: Assistive device utilized: Single point cane  Sit to stand: CGA close CGA to complete  Stand to sit: Complete Independence   GAIT: Gait pattern:  cautious, decreased gait  speed due to unsteadiness, uses SPC, PT provides close CGA Distance walked: distance of clinic  Assistive device utilized: Single point cane Level of assistance: CGA Comments: limited due to dizziness    PATIENT SURVEYS:  ABC scale 31.3% FOTO 50 (goal score 58) DHI: deferred   VESTIBULAR ASSESSMENT   GENERAL OBSERVATION: Pt very cautious ambulator due to unsteadiness.    SYMPTOM BEHAVIOR:   Subjective history: see above, onset one year ago, reports vertigo resolves and now more unsteadiness, however pt takes Meclizine 1x/day.   Non-Vestibular symptoms: headaches, tinnitus, and migraine symptoms   Type of dizziness: Unsteady with head/body turns, Lightheadedness/Faint, and "World moves"   Progression of symptoms: better, however pt reports taking meclizine 1x/day   OCULOMOTOR EXAM:   Ocular Alignment: normal   Ocular ROM: No Limitations   Gaze-Induced Nystagmus: absent   Smooth Pursuits: saccades intermittent saccades, does report feels a bit difficult to focus on target and makes pt dizzy   Saccades:  Reports testing makes her feel off balance, as if she is spinning   Vergence: WNL  POSITIONAL TESTING AND TREATMENT:   Room light:  Dix-Hallpike R: no nystagmus  noted, pt is on meclinize today, does report mild unsteadiness/dizziness that lasts about 30 seconds but no spinning   Dix-Hallpike L: moderate dizziness, no nystagmus, rates worse than R side, lasts under a minute, and feels self-spinning sensation  Roll test: negative R, reports mild dizziness <30 sec on L  Treatment: Epley maneuver: 1x to L side. Pt did feel self-spinning throughout except in last position. Instructed pt in post-maneuver precatuions. Wlil retest future session. Provided education that pt needs to confirm with physician if it OK to stop meclinize 36 hrs prior to next appointment to improve accuracy of assessment.     VESTIBULAR - OCULAR REFLEX:   deferred     PATIENT EDUCATION: Education  details: Exam findings, goals, plan, post-treatment precautions Person educated: Patient Education method: Explanation, Demonstration, Tactile cues, and Verbal cues Education comprehension: verbalized understanding and returned demonstration   GOALS: Goals reviewed with patient? Yes - Reviewed in part, remainder of goals to be assessed and reviewed with pt next 1-2 sessions.  SHORT TERM GOALS: Target date: 11/28/2021   Patient will be independent in home exercise program to improve strength/mobility for better functional independence with ADLs. Baseline: To be initiated next 1-2 visits Goal status: INITIAL   LONG TERM GOALS: Target date: 01/09/2022    Patient will increase FOTO score to equal to or greater than  58   to demonstrate statistically significant improvement in mobility and quality of life.  Baseline: 50 Goal status: INITIAL  2.  Pt will report at least 50% improvement in dizziness and unsteadiness symptoms with daily activities and gait in order to improve QOL and decrease fall risk. Baseline: Pt dizzy with ADLs, gait at baseline Goal status: INITIAL  3. Patient will increase ABC scale score >80% to demonstrate better functional mobility and better confidence with ADLs.  Baseline: 31.3% Goal status: INITIAL  4.  Patient will reduce dizziness handicap inventory score to <50, for less dizziness with ADLs and increased safety with home and work tasks.  Baseline:  Goal status: INITIAL  5.  Patient will increase dynamic gait index score to >19/24 as to demonstrate reduced fall risk and improved dynamic gait balance for better safety with community/home ambulation.   Baseline: to be tested next 1-2 visits Goal status: INITIAL   ASSESSMENT:  CLINICAL IMPRESSION: Patient is a pleasant 66 y.o. female who was seen today for physical therapy evaluation and treatment for dizziness and imbalance. Examination indicates dizziness with positional testing, unsteady gait,  decreased balance confidence, and abnormal smooth pursuits and saccades. Smooth pursuits were saccadic and increased dizziness, movement of saccades were WNL but made pt dizzy. Pt currently on meclizine, but did experience self-spinning with L side Dix-Hallpike although no nystagmus noted. Epley maneuver provided to L side 1x on this date. Educated pt on post-maneuver precautions and plan for next session. Pt verbalized understanding. The pt will benefit from further skilled PT to improve balance confidence, balance, gait, and to decrease dizziness symptoms with movement.     OBJECTIVE IMPAIRMENTS Abnormal gait, decreased balance, decreased mobility, difficulty walking, decreased strength, dizziness, improper body mechanics, postural dysfunction, and pain.   ACTIVITY LIMITATIONS carrying, bending, standing, stairs, transfers, bed mobility, reach over head, and locomotion level  PARTICIPATION LIMITATIONS: meal prep, cleaning, laundry, driving, shopping, community activity, and yard work  PERSONAL FACTORS Age, Sex, Time since onset of injury/illness/exacerbation, and 3+ comorbidities: PMH significant for migraines, memory loss, bipolar disorder, diabetes, chronic pain syndrome, UTI, CKD stage III, osteoporosis, sciatica R side, borderline personality  disorder, history of autism spectrum disorder, history of posttraumatic stress disorder.   are also affecting patient's functional outcome.   REHAB POTENTIAL: Good  CLINICAL DECISION MAKING: Evolving/moderate complexity  EVALUATION COMPLEXITY: Moderate   PLAN: PT FREQUENCY: 2x/week  PT DURATION: 12 weeks  PLANNED INTERVENTIONS: Therapeutic exercises, Therapeutic activity, Neuromuscular re-education, Balance training, Gait training, Patient/Family education, Self Care, Joint mobilization, Stair training, Vestibular training, Canalith repositioning, Visual/preceptual remediation/compensation, Orthotic/Fit training, DME instructions, Dry Needling,  Electrical stimulation, Wheelchair mobility training, Spinal mobilization, Cryotherapy, Moist heat, Splintting, Taping, Traction, Ultrasound, Manual therapy, and Re-evaluation  PLAN FOR NEXT SESSION: complete further assessment, maneuvers as necessary, initiate HEP   Baird Kay, PT 10/17/2021, 3:52 PM

## 2021-10-20 ENCOUNTER — Other Ambulatory Visit: Payer: Self-pay | Admitting: Psychiatry

## 2021-10-20 DIAGNOSIS — F3174 Bipolar disorder, in full remission, most recent episode manic: Secondary | ICD-10-CM

## 2021-10-20 DIAGNOSIS — F603 Borderline personality disorder: Secondary | ICD-10-CM

## 2021-10-23 ENCOUNTER — Ambulatory Visit (INDEPENDENT_AMBULATORY_CARE_PROVIDER_SITE_OTHER): Payer: Medicare PPO | Admitting: Psychiatry

## 2021-10-23 ENCOUNTER — Encounter: Payer: Self-pay | Admitting: Psychiatry

## 2021-10-23 VITALS — BP 128/79 | HR 78 | Temp 98.7°F | Wt 216.4 lb

## 2021-10-23 DIAGNOSIS — Z8659 Personal history of other mental and behavioral disorders: Secondary | ICD-10-CM | POA: Diagnosis not present

## 2021-10-23 DIAGNOSIS — F840 Autistic disorder: Secondary | ICD-10-CM | POA: Diagnosis not present

## 2021-10-23 DIAGNOSIS — F603 Borderline personality disorder: Secondary | ICD-10-CM

## 2021-10-23 DIAGNOSIS — F3174 Bipolar disorder, in full remission, most recent episode manic: Secondary | ICD-10-CM | POA: Diagnosis not present

## 2021-10-23 MED ORDER — BUPROPION HCL ER (XL) 150 MG PO TB24: 150.0000 mg | ORAL_TABLET | Freq: Every day | ORAL | 1 refills | Status: DC

## 2021-10-23 MED ORDER — QUETIAPINE FUMARATE 25 MG PO TABS: 25.0000 mg | ORAL_TABLET | ORAL | 1 refills | Status: DC

## 2021-10-23 MED ORDER — TRAZODONE HCL 100 MG PO TABS: 100.0000 mg | ORAL_TABLET | Freq: Every evening | ORAL | 1 refills | Status: DC | PRN

## 2021-10-23 MED ORDER — LAMOTRIGINE 200 MG PO TABS: 200.0000 mg | ORAL_TABLET | Freq: Every day | ORAL | 1 refills | Status: DC

## 2021-10-23 MED ORDER — MIRTAZAPINE 15 MG PO TABS: 15.0000 mg | ORAL_TABLET | Freq: Every day | ORAL | 1 refills | Status: DC

## 2021-10-23 NOTE — Progress Notes (Unsigned)
BH MD OP Progress Note  10/23/2021 1:36 PM Marilyn Davis  MRN:  [id number]  Chief Complaint:  Chief Complaint  Patient presents with   Follow-up   66 year old Caucasian female with history of bipolar disorder, borderline personality disorder, presented for medication management.   HPI: Marilyn Davis is a 66 year old Caucasian female who has a history of bipolar disorder, probable borderline personality disorder, multiple other diagnoses including diabetes mellitus, sciatica, dizziness, tremors was evaluated in office today.  Patient today appeared to be pleasant, cooperative.  Was able to answer questions appropriately.  Patient today reports she continues to have dizziness or vertigo and is currently in physical therapy.  She uses a cane to walk.  Patient reports she has had episodes of visual hallucinations of seeing a gray cat on and off, the last time was a week ago and prior to that it was several months ago.  It does not happen regularly and it does not distress her.  Patient also reports feeling of skin crawling, this may have been a month ago.  She reports she has had this before especially when she is anxious.  Lately she has been anxious about the fact that her insurance does not cover all of her medications especially for her diabetes.  That does worry her.  Likely that could have triggered it.  Currently denies any skin crawling or other perceptual disturbances.  Patient reports sleep is good.  Denies any significant depression, mood swings.  Denies suicidality or homicidality.  Reports she does have a dry mouth from the medications, currently using Biotene mouthwash which does not help much.  Patient does report a history of being diagnosed with autism spectrum disorder.  She denies having testing done.  However does report deficits in social emotional reciprocity including abnormal social approach, failure of normal back-and-forth conversation, reduced sharing of  interest, emotions, failure to initiate or respond to social interactions.  Patient also reports insistence on sameness, inflexible adherence to routines and so on.  Reports she could not hold a job, after 3 months of being on a job she could not keep it.  Patient currently is on SSI.  Patient denies any other concerns today.  Visit Diagnosis:    ICD-10-CM   1. Bipolar 1 disorder, manic, full remission (HCC)  F31.74 traZODone (DESYREL) 100 MG tablet    QUEtiapine (SEROQUEL) 25 MG tablet    lamoTRIgine (LAMICTAL) 200 MG tablet    buPROPion (WELLBUTRIN XL) 150 MG 24 hr tablet    mirtazapine (REMERON) 15 MG tablet    2. Borderline personality disorder (HCC)  F60.3 traZODone (DESYREL) 100 MG tablet    QUEtiapine (SEROQUEL) 25 MG tablet    3. Autism spectrum disorder  F84.0     4. History of posttraumatic stress disorder (PTSD)  Z86.59       Past Psychiatric History: Reviewed past psychiatric history from progress note on 07/31/2021.  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  Family History:  Family History  Problem Relation Age of Onset   Personality disorder Mother    Bipolar disorder Father    Alcohol abuse Brother    Drug abuse Brother    Suicidality Other    Breast cancer Neg Hx     Social History: Reviewed social history from progress note on 07/31/2021. Social History   Socioeconomic History    Marital status: Married    Spouse name: johnnie   Number of children: 2   Years of education: Not on file   Highest education level: Some college, no degree  Occupational History   Not on file  Tobacco Use   Smoking status: Never   Smokeless tobacco: Never  Vaping Use   Vaping Use: Never used  Substance and Sexual Activity   Alcohol use: Never   Drug use: Never   Sexual activity: Not Currently  Other Topics Concern   Not on file  Social History Narrative   Not on file   Social Determinants of Health   Financial Resource Strain: Not on file  Food Insecurity: Not on file  Transportation Needs: Not on file  Physical Activity: Not on file  Stress: Not on file  Social Connections: Not on file    Allergies:  Allergies  Allergen Reactions   Pseudoephedrine Hcl Other (See Comments)    manic Other reaction(s): Hallucinations   Cabbage Nausea And Vomiting    Cooked    Ibuprofen Itching    Metabolic Disorder Labs: Lab Results  Component Value Date   HGBA1C 6.2 (H) 03/13/2019   MPG 131.24 03/13/2019   Lab Results  Component Value Date   PROLACTIN 17.2 08/02/2021   No results found for: "CHOL", "TRIG", "HDL", "CHOLHDL", "VLDL", "LDLCALC" Lab Results  Component Value Date   TSH 1.231 08/02/2021   TSH 1.178 03/13/2019    Therapeutic Level Labs: No results found for: "LITHIUM" No results found for: "VALPROATE" No results found for: "CBMZ"  Current Medications: Current Outpatient Medications  Medication Sig Dispense Refill   alendronate (FOSAMAX) 70 MG tablet Take 70 mg by mouth once a week.     atorvastatin (LIPITOR) 10 MG tablet Take 1 tablet (10 mg total) by mouth daily at 6 PM. 30 tablet 1   fenofibrate micronized (LOFIBRA) 200 MG capsule Take 200 mg by mouth daily.     fluticasone (FLONASE) 50 MCG/ACT nasal spray Place 2 sprays into both nostrils daily.     gabapentin (NEURONTIN) 300 MG capsule Take 300 mg by mouth daily.     insulin glargine (LANTUS) 100  UNIT/ML Solostar Pen Inject 32 Units into the skin at bedtime. 15 mL 6   montelukast (SINGULAIR) 10 MG tablet Take 1 tablet (10 mg total) by mouth at bedtime. 30 tablet 1   pantoprazole (PROTONIX) 40 MG tablet Take 40 mg by mouth daily.     RYBELSUS 3 MG TABS Take 3 mg by mouth daily.     traMADol (ULTRAM) 50 MG tablet Take 50 mg by mouth in the morning, at noon, and at bedtime.     buPROPion (WELLBUTRIN XL) 150 MG 24 hr tablet Take 1 tablet (150 mg total) by mouth daily. 90 tablet 1   lamoTRIgine (LAMICTAL) 200 MG tablet Take 1 tablet (200 mg total) by mouth at bedtime. 90 tablet 1   linaclotide (LINZESS) 145 MCG CAPS capsule Take 145 mcg by mouth daily. (Patient not taking: Reported on  10/23/2021)     mirtazapine (REMERON) 15 MG tablet Take 1 tablet (15 mg total) by mouth at bedtime. 90 tablet 1   QUEtiapine (SEROQUEL) 25 MG tablet Take 1-4 tablets (25-100 mg total) by mouth as directed. Take 1 tablet daily AM and 4 tablets daily at bedtime 450 tablet 1   traZODone (DESYREL) 100 MG tablet Take 1-2 tablets (100-200 mg total) by mouth at bedtime as needed for sleep. 180 tablet 1   No current facility-administered medications for this visit.     Musculoskeletal: Strength & Muscle Tone: within normal limits Gait & Station:  walks with support of cane Patient leans: N/A  Psychiatric Specialty Exam: Review of Systems  Neurological:  Positive for dizziness (Chronic).  All other systems reviewed and are negative.   Blood pressure 128/79, pulse 78, temperature 98.7 F (37.1 C), temperature source Temporal, weight 216 lb 6.4 oz (98.2 kg).Body mass index is 35.74 kg/m.  General Appearance: Casual  Eye Contact:  Fair  Speech:  Clear and Coherent  Volume:  Normal  Mood:  Euthymic  Affect:  Congruent  Thought Process:  Goal Directed and Descriptions of Associations: Intact  Orientation:  Full (Time, Place, and Person)  Thought Content: Logical   Suicidal Thoughts:  No  Homicidal Thoughts:   No  Memory:  Immediate;   Fair Recent;   Fair Remote;   Fair does report short-term memory loss.  Judgement:  Fair  Insight:  Fair  Psychomotor Activity:  Normal  Concentration:  Concentration: Fair and Attention Span: Fair  Recall:  [employer] of Knowledge: Fair  Language: Fair  Akathisia:  No  Handed:  Ambidextrous  AIMS (if indicated): done  Assets:  Communication Skills Desire for Improvement Housing Intimacy Transportation  ADL's:  Intact  Cognition: WNL  Sleep:  Fair   Screenings: AIMS    Flowsheet Row Office Visit from 08/29/2021 in [facility] Office Visit from 08/07/2021 in [facility] Office Visit from 07/31/2021 in [facility] Admission (Discharged) from 03/14/2019 in [facility]  AIMS Total Score 0 0 0 0      AUDIT    Flowsheet Row Admission (Discharged) from 03/14/2019 in [facility] INPATIENT BEHAVIORAL MEDICINE  Alcohol Use Disorder Identification Test Final Score (AUDIT) 0      GAD-7    Flowsheet Row Office Visit from 10/23/2021 in [facility] Office Visit from 07/31/2021 in [facility]  Total GAD-7 Score 2 3      PHQ2-9    Flowsheet Row Office Visit from 10/23/2021 in [facility] Office Visit from 08/29/2021 in [facility] Office Visit from 08/07/2021 in [facility] Office Visit from 07/31/2021 in [facility]  PHQ-2 Total Score 0 0 0 0      Flowsheet Row Office Visit from 10/23/2021 in [facility] Office Visit from 08/29/2021 in [facility] Office Visit from 08/07/2021 in [facility]  C-SSRS RISK CATEGORY No Risk No Risk No Risk        Assessment and Plan: Marilyn Davis is a 66 year old  Caucasian female who has a history of bipolar disorder, probable borderline personality disorder, autism spectrum disorder, diabetes mellitus, sciatica, dizziness, tremors, presented for medication management.  Patient is currently stable.  Plan Bipolar disorder in remission Seroquel 25 mg p.o. daily in the morning and 100 mg at bedtime. Mirtazapine 15 mg p.o. nightly Trazodone  100-200 mg p.o. nightly as needed Lamotrigine 200 mg p.o. daily Wellbutrin XL 150 mg p.o. daily. She is also on gabapentin for pain.  Borderline personality disorder-stable Will monitor closely  Autism spectrum disorder-chronic-stable Patient may benefit from psychotherapy.  Patient is not interested at this time.  Patient on polypharmacy, will continue to taper off gradually.  We will consider reducing the dosage of some of her medications at next visit.  Patient to continue physical therapy for her vertigo/dizziness.  Also advised to use Biotene mouthwash as well as chewing sugarless gums for dry mouth.  Collateral information obtained from spouse who reports patient is doing well.  Follow-up in clinic in 3 months or sooner if needed.   This note was generated in part or whole with voice recognition software. Voice recognition is usually quite accurate but there are transcription errors that can and very often do occur. I apologize for any typographical errors that were not detected and corrected.     Jomarie Longs, MD 10/23/2021, 1:36 PM

## 2021-10-24 ENCOUNTER — Ambulatory Visit: Payer: Medicare PPO | Attending: Neurology

## 2021-10-24 DIAGNOSIS — R262 Difficulty in walking, not elsewhere classified: Secondary | ICD-10-CM | POA: Diagnosis present

## 2021-10-24 DIAGNOSIS — R2681 Unsteadiness on feet: Secondary | ICD-10-CM | POA: Diagnosis present

## 2021-10-24 DIAGNOSIS — R42 Dizziness and giddiness: Secondary | ICD-10-CM | POA: Diagnosis present

## 2021-10-24 DIAGNOSIS — R2689 Other abnormalities of gait and mobility: Secondary | ICD-10-CM | POA: Insufficient documentation

## 2021-10-24 NOTE — Therapy (Signed)
OUTPATIENT PHYSICAL THERAPY VESTIBULAR TREATMENT NOTE     Patient Name: Marilyn Davis MRN: [id number] DOB:06-Jul-1955, 66 y.o., female Today's Date: 10/25/2021  PCP: No PCP listed in system REFERRING PROVIDER: Morene Crocker, MD   PT End of Session - 10/25/21 0949     Visit Number 2    Number of Visits 25    Date for PT Re-Evaluation 01/09/22    PT Start Time 1148    PT Stop Time 1231    PT Time Calculation (min) 43 min    Equipment Utilized During Treatment Gait belt    Activity Tolerance Patient tolerated treatment well    Behavior During Therapy WFL for tasks assessed/performed              Past Medical History:  Diagnosis Date   Bipolar disorder (HCC)    Diabetes mellitus without complication (HCC)    Sciatica    Past Surgical History:  Procedure Laterality Date   ABDOMINAL HYSTERECTOMY     BREAST BIOPSY Right 09/30/2019   9:00, 5cmfn, Q shape, neg   BREAST BIOPSY Right 09/30/2019   9:00, 9cmfn, vision, neg   EXPLORATORY LAPAROTOMY     SHOULDER SURGERY     TONSILLECTOMY     Patient Active Problem List   Diagnosis Date Noted   Sciatica of right side 07/31/2021   Bipolar 1 disorder, manic, full remission (HCC) 07/31/2021   Borderline personality disorder (HCC) 07/31/2021   History of autism spectrum disorder 07/31/2021   History of posttraumatic stress disorder (PTSD) 07/31/2021   Drug reaction 07/31/2021   Uncontrolled type 1 diabetes mellitus with hyperglycemia, with long-term current use of insulin (HCC) 07/27/2021   AKI (acute kidney injury) (HCC) 07/27/2021   UTI (urinary tract infection) 07/27/2021   Dizziness 09/13/2020   Memory loss or impairment 09/13/2020   Tremor 09/13/2020   Migraine headache 01/25/2020   Osteoporosis 10/26/2019   Irritable bowel syndrome 08/24/2019   Bipolar disorder (HCC) 08/03/2019   Esophageal reflux 08/03/2019   Mixed hyperlipidemia 07/11/2019   Type 2 diabetes mellitus with kidney complication, with  long-term current use of insulin (HCC) 03/15/2019   Chronic pain syndrome 03/15/2019   Dyslipidemia 03/15/2019   Bipolar 1 disorder, depressed, severe (HCC) 03/14/2019   CKD (chronic kidney disease), stage III (HCC) 10/16/2018   High risk medication use 05/15/2018    ONSET DATE: Pt reports approximately 1 year ago (August 2022)  REFERRING DIAG: H81.10 (ICD-10-CM) - BPPV (benign paroxysmal positional vertigo)  THERAPY DIAG:  Dizziness and giddiness  Unsteadiness on feet  Rationale for Evaluation and Treatment Rehabilitation  SUBJECTIVE:   SUBJECTIVE STATEMENT: Pt reports she took meclizine prior to appointment. Plans to hold it for assessment next visit. She felt very dizzy following last appointment, but it gradually improved. Still feels the world is tilted sideways and spins a little to fast. Pt using her SPC today. Pt accompanied by: self  PAIN:  Are you having pain? Yes: NPRS scale: None reported (below is previous information) Pain location: R hip Pain description: radiates from low back into hip, occ down into leg Aggravating factors:  Relieving factors:   PERTINENT HISTORY: Pt reports onset of dizziness/unsteadiness about a year ago. Her symptoms started with spinning that lasted 30-45 seconds and occurred with movements such as turning her head, turning, bending over, leaning back. She reports falling 2x because of her symptoms. She had HH therapy during which her vertigo resolved. However, pt states "I still feel off balance. Some days it's  really bad. I don't go anywhere or do anything." Pt was told that she has BPPV. She feels like the "world is tipped slightly." Her current symptoms are now more of a feeling of unsteadiness and on bad days she can feel light-headed. 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 Does not have hearing aids. Did not have a cold prior to symptom onset. Did  see ENT without findings, does not report VNG testing. Pt reports she does not have migraines often currently, last one was a month ago. Has not noticed a connection between migraines and dizziness. Pt does taken meclizine, takes 1x/day, and has taken it today. Pt has had sciatica for 6 years felt in R hip. Works with pain management doctor.  Per chart other PMH significant for migraines, memory loss, bipolar disorder, diabetes, chronic pain syndrome, UTI, CKD stage III, osteoporosis, sciatica R side, borderline personality disorder, history of autism spectrum disorder, history of posttraumatic stress disorder.    PAIN:  Are you having pain? Yes: NPRS scale: 4/10 Pain location: R hip Pain description: radiates from low back into hip, occ down into leg Aggravating factors:   Relieving factors:    PRECAUTIONS: Fall  WEIGHT BEARING RESTRICTIONS No  FALLS: Has patient fallen in last 6 months? Yes. Number of falls 2; reports tends to lose balance to R side  LIVING ENVIRONMENT: Lives with: lives with their family and lives with their spouse Lives in: House/apartment Stairs: No Has following equipment at home: Single point cane, Environmental consultant - 4 wheeled, shower chair, and Grab bars  PLOF: Independent  PATIENT GOALS "To be able to walk without any assistance."  Wants to be ready to walk at the [facility] in October.  OBJECTIVE: taken at evaluation unless otherwise specified  DIAGNOSTIC FINDINGS:   07/27/21 MR BRAIN WO CONTRAST "IMPRESSION: No acute intracranial process. No evidence of acute or subacute infarct."    07/27/21 CT HEAD WO CONTRAST "IMPRESSION: 1. No acute intracranial pathology. 2. Chronic microangiopathy and cerebral atrophy. 3. Chronic right maxillary sinusitis."  [place] 09/24/21: "- Carotid US was wnl. "  COGNITION: Overall cognitive status: Within functional limits for tasks assessed   SENSATION: Did report tingling in her feet when she experienced vertigo, reports lasted  duration of vertigo 4-5 months. Was tested for neuropathy found not to have it.  EDEMA:  No swelling.    POSTURE: rounded shoulders   Cervical ROM:  deferred    STRENGTH: deferred, however did require assist throughout for bed mobility with testing on plinth, to go from sidelye>sit>stand, also impacted by dizziness  TRANSFERS: Assistive device utilized: Single point cane  Sit to stand: CGA close CGA to complete  Stand to sit: Complete Independence   GAIT: Gait pattern:  cautious, decreased gait speed due to unsteadiness, uses SPC, PT provides close CGA Distance walked: distance of clinic  Assistive device utilized: Single point cane Level of assistance: CGA Comments: limited due to dizziness    PATIENT SURVEYS:  ABC scale 31.3% FOTO 50 (goal score 58) DHI: 54, indicating moderate perception of handicap taken 10/24/2021   VESTIBULAR ASSESSMENT taken at evaluation unless specified otherwise   GENERAL OBSERVATION: Pt very cautious ambulator due to unsteadiness.    SYMPTOM BEHAVIOR:   Subjective history: see above, onset one year ago, reports vertigo resolves and now more unsteadiness, however pt takes Meclizine 1x/day.   Non-Vestibular symptoms: headaches, tinnitus, and migraine symptoms  Type of dizziness: Unsteady with head/body turns, Lightheadedness/Faint, and "World moves"   Progression of symptoms: better, however pt reports taking meclizine 1x/day   OCULOMOTOR EXAM:   Ocular Alignment: normal   Ocular ROM: No Limitations   Gaze-Induced Nystagmus: absent   Smooth Pursuits: saccades intermittent saccades, does report feels a bit difficult to focus on target and makes pt dizzy   Saccades:  Reports testing makes her feel off balance, as if she is spinning   Vergence: WNL  POSITIONAL TESTING AND TREATMENT:   Room light:  Dix-Hallpike R: no nystagmus noted, pt is on meclinize today, does report mild unsteadiness/dizziness that lasts about 30 seconds but no  spinning   Dix-Hallpike L: moderate dizziness, no nystagmus, rates worse than R side, lasts under a minute, and feels self-spinning sensation  Roll test: negative R, reports mild dizziness <30 sec on L  Treatment: Epley maneuver: 1x to L side. Pt did feel self-spinning throughout except in last position. Instructed pt in post-maneuver precatuions. Will retest future session. Provided education that pt needs to confirm with physician if it OK to stop meclinize 36 hrs prior to next appointment to improve accuracy of assessment.    TODAY's TREATMENT: 10/24/2021  DVA testing: Baseline line 8 Change to line 5-6. First unable to read line 6 then was able to with second attempt (changed to reading line backwards). Reports self spinning sensation with testing and dizziness reaches 4/10. Requires recovery interval. Pt states, "I don't want to move my head because I'm afraid it will start up again."   DGI:13/24  MCTSIB: First condition - completes Second condition - almost completes full 30 sec, must hold on to support surface Third condition -  completes Fourth condition- unable to complete   Seated: VORx1 with vertical and horizontal head turns 2x30 sec of each.  Added to HEP (see below), reviewed technique with pt and discussed when to take recovery intervals. Pt verbalized understanding for all and demonstrated correct technique in session.  PT recommends pt use RW and bring to PT next session due to unsteadiness with SPC. Pt verbalizes understanding.       PATIENT EDUCATION: Education details: further assessment findings, HEP  Person educated: Patient Education method: Explanation, Demonstration, Tactile cues, Verbal cues, and Handouts Education comprehension: verbalized understanding, returned demonstration, verbal cues required, and needs further education  HOME EXERCISE PROGRAM:  Initiated 10/24/2021 Access Code: [email or web address] URL: [email or web address] Date:  10/24/2021 Prepared by: Temple Pacini  Exercises - Seated Gaze Stabilization with Head Rotation  - 1 x daily - 7 x weekly - 4 sets - 1 reps - 30 sec hold - Imaginary Target with Head Nod  - 1 x daily - 7 x weekly - 4 sets - 1 reps - 30 sec hold *correction to seated gaze stabilization with head nod    GOALS: Goals reviewed with patient? Yes - goals reviewed  SHORT TERM GOALS: Target date: 12/06/2021   Patient will be independent in home exercise program to improve strength/mobility for better functional independence with ADLs. Baseline: To be initiated next 1-2 visits; 9/6: initiated  Goal status: INITIAL   LONG TERM GOALS: Target date: 01/17/2022    Patient will increase FOTO score to equal to or greater than  58   to demonstrate statistically significant improvement in mobility and quality of life.  Baseline: 50 Goal status: INITIAL  2.  Pt will report at least 50% improvement in dizziness and unsteadiness symptoms with daily activities and gait in order to improve  QOL and decrease fall risk. Baseline: Pt dizzy with ADLs, gait at baseline Goal status: INITIAL  3. Patient will increase ABC scale score >80% to demonstrate better functional mobility and better confidence with ADLs.  Baseline: 31.3% Goal status: INITIAL  4.  Patient will reduce dizziness handicap inventory score to <50, for less dizziness with ADLs and increased safety with home and work tasks.  Baseline: 9/6: 54 Goal status: INITIAL  5.  Patient will increase dynamic gait index score to >19/24 as to demonstrate reduced fall risk and improved dynamic gait balance for better safety with community/home ambulation.   Baseline: to be tested next 1-2 visits; 9/6: 13/24 Goal status: INITIAL   ASSESSMENT:  CLINICAL IMPRESSION: Further assessment completed on this date. DHI score indicates moderate perception of handicap due to dizziness symptoms, DGI score indicates pt at increased risk for future falls. M-CTSIB  testing suggestive of pt relying heavily on visual cues to maintain balance with deficits in use of vestibular and proprioceptive cues. DVA testing triggered pt's symptoms after which pt required a recovery interval. VORx1 (seated) added to HEP to address deficits. Additionally, PT advised pt to use RW and bring to session due to increased unsteadiness with testing and interventions. The pt will benefit from further skilled PT to improve balance confidence, balance, gait, and to decrease dizziness symptoms with movement.     OBJECTIVE IMPAIRMENTS Abnormal gait, decreased balance, decreased mobility, difficulty walking, decreased strength, dizziness, improper body mechanics, postural dysfunction, and pain.   ACTIVITY LIMITATIONS carrying, bending, standing, stairs, transfers, bed mobility, reach over head, and locomotion level  PARTICIPATION LIMITATIONS: meal prep, cleaning, laundry, driving, shopping, community activity, and yard work  PERSONAL FACTORS Age, Sex, Time since onset of injury/illness/exacerbation, and 3+ comorbidities: PMH significant for migraines, memory loss, bipolar disorder, diabetes, chronic pain syndrome, UTI, CKD stage III, osteoporosis, sciatica R side, borderline personality disorder, history of autism spectrum disorder, history of posttraumatic stress disorder.   are also affecting patient's functional outcome.   REHAB POTENTIAL: Good  CLINICAL DECISION MAKING: Evolving/moderate complexity  EVALUATION COMPLEXITY: Moderate   PLAN: PT FREQUENCY: 2x/week  PT DURATION: 12 weeks  PLANNED INTERVENTIONS: Therapeutic exercises, Therapeutic activity, Neuromuscular re-education, Balance training, Gait training, Patient/Family education, Self Care, Joint mobilization, Stair training, Vestibular training, Canalith repositioning, Visual/preceptual remediation/compensation, Orthotic/Fit training, DME instructions, Dry Needling, Electrical stimulation, Wheelchair mobility training,  Spinal mobilization, Cryotherapy, Moist heat, Splintting, Taping, Traction, Ultrasound, Manual therapy, and Re-evaluation  PLAN FOR NEXT SESSION: complete further assessment, maneuvers as necessary, cervical ROM/UE MMT testing   Baird Kay, PT 10/25/2021, 10:03 AM

## 2021-10-30 NOTE — Therapy (Signed)
OUTPATIENT PHYSICAL THERAPY VESTIBULAR TREATMENT NOTE     Patient Name: Marilyn Davis MRN: [id number] DOB:January 12, 1956, 66 y.o., female Today's Date: 11/01/2021  PCP: No PCP listed in system REFERRING PROVIDER: Morene Crocker, MD   PT End of Session - 10/31/21 1353     Visit Number 3    Number of Visits 25    Date for PT Re-Evaluation 01/09/22    PT Start Time 0933    PT Stop Time 1015    PT Time Calculation (min) 42 min    Equipment Utilized During Treatment Gait belt    Activity Tolerance Patient tolerated treatment well    Behavior During Therapy WFL for tasks assessed/performed               Past Medical History:  Diagnosis Date   Bipolar disorder (HCC)    Diabetes mellitus without complication (HCC)    Sciatica    Past Surgical History:  Procedure Laterality Date   ABDOMINAL HYSTERECTOMY     BREAST BIOPSY Right 09/30/2019   9:00, 5cmfn, Q shape, neg   BREAST BIOPSY Right 09/30/2019   9:00, 9cmfn, vision, neg   EXPLORATORY LAPAROTOMY     SHOULDER SURGERY     TONSILLECTOMY     Patient Active Problem List   Diagnosis Date Noted   Sciatica of right side 07/31/2021   Bipolar 1 disorder, manic, full remission (HCC) 07/31/2021   Borderline personality disorder (HCC) 07/31/2021   History of autism spectrum disorder 07/31/2021   History of posttraumatic stress disorder (PTSD) 07/31/2021   Drug reaction 07/31/2021   Uncontrolled type 1 diabetes mellitus with hyperglycemia, with long-term current use of insulin (HCC) 07/27/2021   AKI (acute kidney injury) (HCC) 07/27/2021   UTI (urinary tract infection) 07/27/2021   Dizziness 09/13/2020   Memory loss or impairment 09/13/2020   Tremor 09/13/2020   Migraine headache 01/25/2020   Osteoporosis 10/26/2019   Irritable bowel syndrome 08/24/2019   Bipolar disorder (HCC) 08/03/2019   Esophageal reflux 08/03/2019   Mixed hyperlipidemia 07/11/2019   Type 2 diabetes mellitus with kidney complication, with  long-term current use of insulin (HCC) 03/15/2019   Chronic pain syndrome 03/15/2019   Dyslipidemia 03/15/2019   Bipolar 1 disorder, depressed, severe (HCC) 03/14/2019   CKD (chronic kidney disease), stage III (HCC) 10/16/2018   High risk medication use 05/15/2018    ONSET DATE: Pt reports approximately 1 year ago (August 2022)  REFERRING DIAG: H81.10 (ICD-10-CM) - BPPV (benign paroxysmal positional vertigo)  THERAPY DIAG:  Dizziness and giddiness  Unsteadiness on feet  Rationale for Evaluation and Treatment Rehabilitation  SUBJECTIVE:   SUBJECTIVE STATEMENT: Pt using RW today. Pt has felt dizzy since stopping meclizine prior to appointment, describes as non-stop spinning that has lasted hours. Pt reports HEP VOR intervention with horizontal head turns is most provoking for her symptoms. She can do 17-18 seconds of and then needs to rest. She  felt dizzy for 2 hours following HEP. Does report 2 falls when walking and pivoting at home, trying to hold onto objects, but did not injure herself.  She did not hit her head. Pt was able to get back up by herself. She saw ENT the other day and found to have blocked R sinus. Pt does report bad HA currently since this morning. Pt accompanied by: self  PAIN:  Are you having pain? Yes: NPRS scale: not rated, HA Pain location: HA Pain description:  Aggravating factors:  Relieving factors:   PERTINENT HISTORY: Pt reports  onset of dizziness/unsteadiness about a year ago. Her symptoms started with spinning that lasted 30-45 seconds and occurred with movements such as turning her head, turning, bending over, leaning back. She reports falling 2x because of her symptoms. She had HH therapy during which her vertigo resolved. However, pt states "I still feel off balance. Some days it's really bad. I don't go anywhere or do anything." Pt was told that she has BPPV. She feels like the "world is tipped slightly." Her current symptoms are now more of a feeling  of unsteadiness and on bad days she can feel light-headed. 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 Does not have hearing aids. Did not have a cold prior to symptom onset. Did see ENT without findings, does not report VNG testing. Pt reports she does not have migraines often currently, last one was a month ago. Has not noticed a connection between migraines and dizziness. Pt does taken meclizine, takes 1x/day, and has taken it today. Pt has had sciatica for 6 years felt in R hip. Works with pain management doctor.  Per chart other PMH significant for migraines, memory loss, bipolar disorder, diabetes, chronic pain syndrome, UTI, CKD stage III, osteoporosis, sciatica R side, borderline personality disorder, history of autism spectrum disorder, history of posttraumatic stress disorder.    PAIN: taken at eval Are you having pain? Yes: NPRS scale: 4/10 Pain location: R hip Pain description: radiates from low back into hip, occ down into leg Aggravating factors:   Relieving factors:    PRECAUTIONS: Fall  WEIGHT BEARING RESTRICTIONS No  FALLS: Has patient fallen in last 6 months? Yes. Number of falls 2; reports tends to lose balance to R side  LIVING ENVIRONMENT: Lives with: lives with their family and lives with their spouse Lives in: House/apartment Stairs: No Has following equipment at home: Single point cane, Environmental consultant - 4 wheeled, shower chair, and Grab bars  PLOF: Independent  PATIENT GOALS "To be able to walk without any assistance."  Wants to be ready to walk at the [facility] in October.  OBJECTIVE: taken at evaluation unless otherwise specified  DIAGNOSTIC FINDINGS:   07/27/21 MR BRAIN WO CONTRAST "IMPRESSION: No acute intracranial process. No evidence of acute or subacute infarct."    07/27/21 CT HEAD WO CONTRAST "IMPRESSION: 1. No acute intracranial pathology. 2. Chronic microangiopathy and  cerebral atrophy. 3. Chronic right maxillary sinusitis."  [place] 09/24/21: "- Carotid US was wnl. "  COGNITION: Overall cognitive status: Within functional limits for tasks assessed   SENSATION: Did report tingling in her feet when she experienced vertigo, reports lasted duration of vertigo 4-5 months. Was tested for neuropathy found not to have it.  EDEMA:  No swelling.    POSTURE: rounded shoulders   Cervical ROM:  deferred    STRENGTH: deferred, however did require assist throughout for bed mobility with testing on plinth, to go from sidelye>sit>stand, also impacted by dizziness  TRANSFERS: Assistive device utilized: Single point cane  Sit to stand: CGA close CGA to complete  Stand to sit: Complete Independence   GAIT: Gait pattern:  cautious, decreased gait speed due to unsteadiness, uses SPC, PT provides close CGA Distance walked: distance of clinic  Assistive device utilized: Single point cane Level of assistance: CGA Comments: limited due to dizziness    PATIENT SURVEYS:  ABC scale 31.3% FOTO 50 (goal score 58) DHI: 54, indicating moderate perception of  handicap taken 10/24/2021   VESTIBULAR ASSESSMENT taken at evaluation unless specified otherwise   GENERAL OBSERVATION: Pt very cautious ambulator due to unsteadiness.    SYMPTOM BEHAVIOR:   Subjective history: see above, onset one year ago, reports vertigo resolves and now more unsteadiness, however pt takes Meclizine 1x/day.   Non-Vestibular symptoms: headaches, tinnitus, and migraine symptoms   Type of dizziness: Unsteady with head/body turns, Lightheadedness/Faint, and "World moves"   Progression of symptoms: better, however pt reports taking meclizine 1x/day   OCULOMOTOR EXAM:   Ocular Alignment: normal   Ocular ROM: No Limitations   Gaze-Induced Nystagmus: absent   Smooth Pursuits: saccades intermittent saccades, does report feels a bit difficult to focus on target and makes pt dizzy   Saccades:   Reports testing makes her feel off balance, as if she is spinning   Vergence: WNL    TODAY's TREATMENT: 10/31/2021   Vestibular goggles donned to suppress visual fixation -  Dix-Hallpike - negative B. No nystagmus   Roll test - dizziness/spinning reported L>R side, possible horizontal beats noted but not significant or definitive, dizziness lasts under a minute  Gufoni to treat L horizontal canal - 2x. Dizziness resolves <30 sec with second treatment in second position of maneuver, however,when pt sat up she reported 6/10 dizziness and described it as a "tipping feeling." This resolved quickly.   PT instructed pt in safety precautions following maneuvers.  Provided and educated pt on use of dizziness symptoms tracker handouts. Instructed to fill-out prior to next session to identify things that may increase symptoms.      PATIENT EDUCATION: Education details: further assessment findings, symptom tracker handout, PT instructs pt to follow-up with her physician regarding timing of taking meclizine in order to maximize benefit of vestibular rehabilitation.  Person educated: Patient Education method: Explanation, Demonstration, Tactile cues, Verbal cues, and Handouts Education comprehension: verbalized understanding, returned demonstration, verbal cues required, and needs further education  HOME EXERCISE PROGRAM: no updates today, pt to continue HEP as previously given, however, PT recommends pt reduce amount of time performed since dizziness lasted 2+ hours per report  Initiated 10/24/2021 Access Code: [email or web address] URL: [email or web address] Date: 10/24/2021 Prepared by: Temple Pacini  Exercises - Seated Gaze Stabilization with Head Rotation  - 1 x daily - 7 x weekly - 4 sets - 1 reps - 30 sec hold - Imaginary Target with Head Nod  - 1 x daily - 7 x weekly - 4 sets - 1 reps - 30 sec hold *correction to seated gaze stabilization with head nod    GOALS: Goals reviewed with  patient? Yes - goals reviewed  SHORT TERM GOALS: Target date: 12/13/2021   Patient will be independent in home exercise program to improve strength/mobility for better functional independence with ADLs. Baseline: To be initiated next 1-2 visits; 9/6: initiated  Goal status: INITIAL   LONG TERM GOALS: Target date: 01/24/2022    Patient will increase FOTO score to equal to or greater than  58   to demonstrate statistically significant improvement in mobility and quality of life.  Baseline: 50 Goal status: INITIAL  2.  Pt will report at least 50% improvement in dizziness and unsteadiness symptoms with daily activities and gait in order to improve QOL and decrease fall risk. Baseline: Pt dizzy with ADLs, gait at baseline Goal status: INITIAL  3. Patient will increase ABC scale score >80% to demonstrate better functional mobility and better confidence with ADLs.  Baseline: 31.3% Goal status: INITIAL  4.  Patient  will reduce dizziness handicap inventory score to <50, for less dizziness with ADLs and increased safety with home and work tasks.  Baseline: 9/6: 54 Goal status: INITIAL  5.  Patient will increase dynamic gait index score to >19/24 as to demonstrate reduced fall risk and improved dynamic gait balance for better safety with community/home ambulation.   Baseline: to be tested next 1-2 visits; 9/6: 13/24 Goal status: INITIAL   ASSESSMENT:  CLINICAL IMPRESSION: Further positional assessment completed with vestibular goggles donned. No obvious nystagmus observed, however, pt did report spinning sensation/dizziness with roll testing L>R side. PT provided Gufoni 2x to treat. Presentation per pt reports and lack of nystagmus with assessment still more suggestive of central cause of dizziness. Plan to focus on habituation, balance and gaze-stabilization interventions next session unless pt reports significant improvement following maneuvers. The pt will benefit from further skilled PT  to improve balance confidence, balance, gait, and to decrease dizziness symptoms with movement.     OBJECTIVE IMPAIRMENTS Abnormal gait, decreased balance, decreased mobility, difficulty walking, decreased strength, dizziness, improper body mechanics, postural dysfunction, and pain.   ACTIVITY LIMITATIONS carrying, bending, standing, stairs, transfers, bed mobility, reach over head, and locomotion level  PARTICIPATION LIMITATIONS: meal prep, cleaning, laundry, driving, shopping, community activity, and yard work  PERSONAL FACTORS Age, Sex, Time since onset of injury/illness/exacerbation, and 3+ comorbidities: PMH significant for migraines, memory loss, bipolar disorder, diabetes, chronic pain syndrome, UTI, CKD stage III, osteoporosis, sciatica R side, borderline personality disorder, history of autism spectrum disorder, history of posttraumatic stress disorder.   are also affecting patient's functional outcome.   REHAB POTENTIAL: Good  CLINICAL DECISION MAKING: Evolving/moderate complexity  EVALUATION COMPLEXITY: Moderate   PLAN: PT FREQUENCY: 2x/week  PT DURATION: 12 weeks  PLANNED INTERVENTIONS: Therapeutic exercises, Therapeutic activity, Neuromuscular re-education, Balance training, Gait training, Patient/Family education, Self Care, Joint mobilization, Stair training, Vestibular training, Canalith repositioning, Visual/preceptual remediation/compensation, Orthotic/Fit training, DME instructions, Dry Needling, Electrical stimulation, Wheelchair mobility training, Spinal mobilization, Cryotherapy, Moist heat, Splintting, Taping, Traction, Ultrasound, Manual therapy, and Re-evaluation  PLAN FOR NEXT SESSION: complete further assessment, maneuvers as necessary, cervical ROM/UE MMT testing, balance, habituation interventions, VOR   Baird Kay, PT 11/01/2021, 9:07 AM

## 2021-10-31 ENCOUNTER — Ambulatory Visit: Payer: Medicare PPO

## 2021-10-31 DIAGNOSIS — R42 Dizziness and giddiness: Secondary | ICD-10-CM | POA: Diagnosis not present

## 2021-10-31 DIAGNOSIS — R2681 Unsteadiness on feet: Secondary | ICD-10-CM

## 2021-11-07 ENCOUNTER — Ambulatory Visit: Payer: Medicare PPO

## 2021-11-07 DIAGNOSIS — R42 Dizziness and giddiness: Secondary | ICD-10-CM | POA: Diagnosis not present

## 2021-11-07 DIAGNOSIS — R2689 Other abnormalities of gait and mobility: Secondary | ICD-10-CM

## 2021-11-07 NOTE — Therapy (Signed)
OUTPATIENT PHYSICAL THERAPY VESTIBULAR TREATMENT NOTE     Patient Name: Marilyn Davis MRN: [id number] DOB:1955/03/31, 66 y.o., female Today's Date: 11/07/2021  PCP: No PCP listed in system REFERRING PROVIDER: Morene Crocker, MD   PT End of Session - 11/07/21 1048     Visit Number 4    Number of Visits 25    Date for PT Re-Evaluation 01/09/22    PT Start Time 1055    PT Stop Time 1143    PT Time Calculation (min) 48 min    Equipment Utilized During Treatment Gait belt    Activity Tolerance Patient tolerated treatment well    Behavior During Therapy WFL for tasks assessed/performed               Past Medical History:  Diagnosis Date   Bipolar disorder (HCC)    Diabetes mellitus without complication (HCC)    Sciatica    Past Surgical History:  Procedure Laterality Date   ABDOMINAL HYSTERECTOMY     BREAST BIOPSY Right 09/30/2019   9:00, 5cmfn, Q shape, neg   BREAST BIOPSY Right 09/30/2019   9:00, 9cmfn, vision, neg   EXPLORATORY LAPAROTOMY     SHOULDER SURGERY     TONSILLECTOMY     Patient Active Problem List   Diagnosis Date Noted   Sciatica of right side 07/31/2021   Bipolar 1 disorder, manic, full remission (HCC) 07/31/2021   Borderline personality disorder (HCC) 07/31/2021   History of autism spectrum disorder 07/31/2021   History of posttraumatic stress disorder (PTSD) 07/31/2021   Drug reaction 07/31/2021   Uncontrolled type 1 diabetes mellitus with hyperglycemia, with long-term current use of insulin (HCC) 07/27/2021   AKI (acute kidney injury) (HCC) 07/27/2021   UTI (urinary tract infection) 07/27/2021   Dizziness 09/13/2020   Memory loss or impairment 09/13/2020   Tremor 09/13/2020   Migraine headache 01/25/2020   Osteoporosis 10/26/2019   Irritable bowel syndrome 08/24/2019   Bipolar disorder (HCC) 08/03/2019   Esophageal reflux 08/03/2019   Mixed hyperlipidemia 07/11/2019   Type 2 diabetes mellitus with kidney complication, with  long-term current use of insulin (HCC) 03/15/2019   Chronic pain syndrome 03/15/2019   Dyslipidemia 03/15/2019   Bipolar 1 disorder, depressed, severe (HCC) 03/14/2019   CKD (chronic kidney disease), stage III (HCC) 10/16/2018   High risk medication use 05/15/2018    ONSET DATE: Pt reports approximately 1 year ago (August 2022)  REFERRING DIAG: H81.10 (ICD-10-CM) - BPPV (benign paroxysmal positional vertigo)  THERAPY DIAG:  Dizziness and giddiness  Other abnormalities of gait and mobility  Rationale for Evaluation and Treatment Rehabilitation  SUBJECTIVE:   SUBJECTIVE STATEMENT: Pt reports she slipped in the tub on Saturday and fell, stating, "I stepped awkwardly." She caught herself on a bar and hit her head. She reports no other injuries, no headaches. And no other symptoms since hitting her head. Pt reports she can do full 30 sec of vertical head turn VORx1, and about 15 sec of horizontal head turns VORx1 before being limited by dizziness. Pt also reports feeling maybe a little better since maneuver from last session. She did report one instance of spinning that lasted 30-45 seconds when she fell. . She reports she has felt off-balance for a long time before, but provides clarification to PT from previous sessions that spinning sensation has actually not lasted beyond a minute and a half. She clarifies that it is other dizziness feelings that have lasted for hours.   Pt accompanied by: self  PAIN:  Are you having pain? Not reported, however, no head-related pain: NPRS scale: not reported Pain location:  Pain description:  Aggravating factors:  Relieving factors:   PERTINENT HISTORY: Pt reports onset of dizziness/unsteadiness about a year ago. Her symptoms started with spinning that lasted 30-45 seconds and occurred with movements such as turning her head, turning, bending over, leaning back. She reports falling 2x because of her symptoms. She had HH therapy during which her vertigo  resolved. However, pt states "I still feel off balance. Some days it's really bad. I don't go anywhere or do anything." Pt was told that she has BPPV. She feels like the "world is tipped slightly." Her current symptoms are now more of a feeling of unsteadiness and on bad days she can feel light-headed. 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 Does not have hearing aids. Did not have a cold prior to symptom onset. Did see ENT without findings, does not report VNG testing. Pt reports she does not have migraines often currently, last one was a month ago. Has not noticed a connection between migraines and dizziness. Pt does taken meclizine, takes 1x/day, and has taken it today. Pt has had sciatica for 6 years felt in R hip. Works with pain management doctor.  Per chart other PMH significant for migraines, memory loss, bipolar disorder, diabetes, chronic pain syndrome, UTI, CKD stage III, osteoporosis, sciatica R side, borderline personality disorder, history of autism spectrum disorder, history of posttraumatic stress disorder.    PAIN: taken at eval Are you having pain? Yes: NPRS scale: 4/10 Pain location: R hip Pain description: radiates from low back into hip, occ down into leg Aggravating factors:   Relieving factors:    PRECAUTIONS: Fall  WEIGHT BEARING RESTRICTIONS No  FALLS: Has patient fallen in last 6 months? Yes. Number of falls 2; reports tends to lose balance to R side  LIVING ENVIRONMENT: Lives with: lives with their family and lives with their spouse Lives in: House/apartment Stairs: No Has following equipment at home: Single point cane, Environmental consultant - 4 wheeled, shower chair, and Grab bars  PLOF: Independent  PATIENT GOALS "To be able to walk without any assistance."  Wants to be ready to walk at the [facility] in October.  OBJECTIVE: taken at evaluation unless otherwise specified  DIAGNOSTIC  FINDINGS:   07/27/21 MR BRAIN WO CONTRAST "IMPRESSION: No acute intracranial process. No evidence of acute or subacute infarct."    07/27/21 CT HEAD WO CONTRAST "IMPRESSION: 1. No acute intracranial pathology. 2. Chronic microangiopathy and cerebral atrophy. 3. Chronic right maxillary sinusitis."  [place] 09/24/21: "- Carotid US was wnl. "  COGNITION: Overall cognitive status: Within functional limits for tasks assessed   SENSATION: Did report tingling in her feet when she experienced vertigo, reports lasted duration of vertigo 4-5 months. Was tested for neuropathy found not to have it.  EDEMA:  No swelling.    POSTURE: rounded shoulders   Cervical ROM:  taken 11/07/2021 Cervical ROM assessment: AROM: Ext 30 deg. Flex 35 deg. Felt initial pain with first rep prior to measurement Lateral flexion deg. 35 deg B. Painful with initial AROM but not with measurement Rotation 55 deg B. Was painful with initial ROM assessment but not with measurement. Comments: general impairment of all cervical ROM.     STRENGTH: deferred, however did require assist throughout for bed mobility with testing on plinth, to go  from sidelye>sit>stand, also impacted by dizziness  TRANSFERS: Assistive device utilized: Single point cane  Sit to stand: CGA close CGA to complete  Stand to sit: Complete Independence   GAIT: Gait pattern:  cautious, decreased gait speed due to unsteadiness, uses SPC, PT provides close CGA Distance walked: distance of clinic  Assistive device utilized: Single point cane Level of assistance: CGA Comments: limited due to dizziness    PATIENT SURVEYS:  ABC scale 31.3% FOTO 50 (goal score 58) DHI: 54, indicating moderate perception of handicap taken 10/24/2021   VESTIBULAR ASSESSMENT taken at evaluation unless specified otherwise   GENERAL OBSERVATION: Pt very cautious ambulator due to unsteadiness.    SYMPTOM BEHAVIOR:   Subjective history: see above, onset one year  ago, reports vertigo resolves and now more unsteadiness, however pt takes Meclizine 1x/day.   Non-Vestibular symptoms: headaches, tinnitus, and migraine symptoms   Type of dizziness: Unsteady with head/body turns, Lightheadedness/Faint, and "World moves"   Progression of symptoms: better, however pt reports taking meclizine 1x/day   OCULOMOTOR EXAM:   Ocular Alignment: normal   Ocular ROM: No Limitations   Gaze-Induced Nystagmus: absent   Smooth Pursuits: saccades intermittent saccades, does report feels a bit difficult to focus on target and makes pt dizzy   Saccades:  Reports testing makes her feel off balance, as if she is spinning   Vergence: WNL    TODAY's TREATMENT: 11/07/2021   Roll and dix-hallpike testing: negative for each B, pt did report mild sx upon sitting up each time after testing, but repoted symptoms as subtle lasting <1 min, no spinning.  Standing on airex, gait belt donned, at support surface: WBOS EC 2x30 sec - completes without UE support and no more than CGA NBOS EC 2x30 sec - PT provides up to min assist  Standing, firm surface, WBOS, BUE support on bar, VORx1 vertical and horizontal head turns - 2x30 sec of each as pt able (initially required recovery interval first rep of vertical head turns). Comments: cuing to decrease speed of head turns, improves ability to complete exercise. Dizziness ranged from 3-5/10. Recovery intervals taken.   Cervical ROM assessment: AROM: Ext 30 deg. Flex 35 deg. Felt initial pain with first rep prior to measurement Lateral flexion deg. 35 deg B. Painful with initial AROM but not with measurement Rotation 55 deg B. Was painful with initial ROM assessment but not with measurement. Comments: general impairment of all cervical ROM.   UT stretch - 2x30 sec B. Provided as addition to HEP Rot stretch - 2x30 sec B. Pulling pain felt B. Comments: Cued pt to decrease/modify ROM as necessary in order to perform pain-free    PATIENT  EDUCATION: Education details: Pt educated throughout session about proper posture and technique with exercises. Improved exercise technique, movement at target joints, use of target muscles after min to mod verbal, visual, tactile cues.   Person educated: Patient Education method: Explanation, Demonstration, Tactile cues, Verbal cues, and Handouts Education comprehension: verbalized understanding, returned demonstration, verbal cues required, and needs further education  HOME EXERCISE PROGRAM:   Added to HEP 11/07/21: Access Code: [email or web address] URL: [email or web address] Date: 11/07/2021 Prepared by: Temple Pacini  Exercises - Seated Cervical Sidebending Stretch  - 1 x daily - 7 x weekly - 2 sets - 2 reps - 30 sec hold  Initiated 10/24/2021 Access Code: [email or web address] URL: [email or web address] Date: 10/24/2021 Prepared by: Temple Pacini  Exercises - Seated Gaze Stabilization with Head Rotation  - 1 x daily - 7 x weekly -  4 sets - 1 reps - 30 sec hold - Imaginary Target with Head Nod  - 1 x daily - 7 x weekly - 4 sets - 1 reps - 30 sec hold *correction to seated gaze stabilization with head nod    GOALS: Goals reviewed with patient? Yes - goals reviewed  SHORT TERM GOALS: Target date: 12/19/2021   Patient will be independent in home exercise program to improve strength/mobility for better functional independence with ADLs. Baseline: To be initiated next 1-2 visits; 9/6: initiated  Goal status: INITIAL   LONG TERM GOALS: Target date: 01/30/2022    Patient will increase FOTO score to equal to or greater than  58   to demonstrate statistically significant improvement in mobility and quality of life.  Baseline: 50 Goal status: INITIAL  2.  Pt will report at least 50% improvement in dizziness and unsteadiness symptoms with daily activities and gait in order to improve QOL and decrease fall risk. Baseline: Pt dizzy with ADLs, gait at baseline Goal status:  INITIAL  3. Patient will increase ABC scale score >80% to demonstrate better functional mobility and better confidence with ADLs.  Baseline: 31.3% Goal status: INITIAL  4.  Patient will reduce dizziness handicap inventory score to <50, for less dizziness with ADLs and increased safety with home and work tasks.  Baseline: 9/6: 54 Goal status: INITIAL  5.  Patient will increase dynamic gait index score to >19/24 as to demonstrate reduced fall risk and improved dynamic gait balance for better safety with community/home ambulation.   Baseline: to be tested next 1-2 visits; 9/6: 13/24 Goal status: INITIAL   ASSESSMENT:  CLINICAL IMPRESSION: Pt presents with improved tolerance for positional testing today, reporting only subtle symptoms that were only triggered with sitting up. These symptoms were brief (seconds), and pt reported no spinning sensation. PT also provided cuing for modification of how pt performs VOR to improve tolerance with intervention. Pt showed within-session improvement following cues. Cervical ROM assessment completed and pt found to be impaired in all planes of motion with pain. PT added UT stretch to HEP to begin to address these deficits.The pt will benefit from further skilled PT to improve balance confidence, balance, gait, and to decrease dizziness symptoms with movement.     OBJECTIVE IMPAIRMENTS Abnormal gait, decreased balance, decreased mobility, difficulty walking, decreased strength, dizziness, improper body mechanics, postural dysfunction, and pain.   ACTIVITY LIMITATIONS carrying, bending, standing, stairs, transfers, bed mobility, reach over head, and locomotion level  PARTICIPATION LIMITATIONS: meal prep, cleaning, laundry, driving, shopping, community activity, and yard work  PERSONAL FACTORS Age, Sex, Time since onset of injury/illness/exacerbation, and 3+ comorbidities: PMH significant for migraines, memory loss, bipolar disorder, diabetes, chronic pain  syndrome, UTI, CKD stage III, osteoporosis, sciatica R side, borderline personality disorder, history of autism spectrum disorder, history of posttraumatic stress disorder.   are also affecting patient's functional outcome.   REHAB POTENTIAL: Good  CLINICAL DECISION MAKING: Evolving/moderate complexity  EVALUATION COMPLEXITY: Moderate   PLAN: PT FREQUENCY: 2x/week  PT DURATION: 12 weeks  PLANNED INTERVENTIONS: Therapeutic exercises, Therapeutic activity, Neuromuscular re-education, Balance training, Gait training, Patient/Family education, Self Care, Joint mobilization, Stair training, Vestibular training, Canalith repositioning, Visual/preceptual remediation/compensation, Orthotic/Fit training, DME instructions, Dry Needling, Electrical stimulation, Wheelchair mobility training, Spinal mobilization, Cryotherapy, Moist heat, Splintting, Taping, Traction, Ultrasound, Manual therapy, and Re-evaluation  PLAN FOR NEXT SESSION: UE MMT testing, balance, habituation interventions, VOR, manual therapy and stretching as able   Baird Kay, PT 11/07/2021, 2:24 PM

## 2021-11-14 ENCOUNTER — Ambulatory Visit: Payer: Medicare PPO

## 2021-11-14 DIAGNOSIS — R262 Difficulty in walking, not elsewhere classified: Secondary | ICD-10-CM

## 2021-11-14 DIAGNOSIS — R42 Dizziness and giddiness: Secondary | ICD-10-CM | POA: Diagnosis not present

## 2021-11-14 DIAGNOSIS — R2681 Unsteadiness on feet: Secondary | ICD-10-CM

## 2021-11-14 NOTE — Therapy (Signed)
OUTPATIENT PHYSICAL THERAPY VESTIBULAR TREATMENT NOTE     Patient Name: Marilyn Davis MRN: [id number] DOB:1955-07-01, 66 y.o., female Today's Date: 11/14/2021  PCP: No PCP listed in system REFERRING PROVIDER: Morene Crocker, MD   PT End of Session - 11/14/21 1016     Visit Number 5    Number of Visits 25    Date for PT Re-Evaluation 01/09/22    PT Start Time 0936    PT Stop Time 1016    PT Time Calculation (min) 40 min    Equipment Utilized During Treatment Gait belt    Activity Tolerance Patient tolerated treatment well    Behavior During Therapy WFL for tasks assessed/performed                Past Medical History:  Diagnosis Date   Bipolar disorder (HCC)    Diabetes mellitus without complication (HCC)    Sciatica    Past Surgical History:  Procedure Laterality Date   ABDOMINAL HYSTERECTOMY     BREAST BIOPSY Right 09/30/2019   9:00, 5cmfn, Q shape, neg   BREAST BIOPSY Right 09/30/2019   9:00, 9cmfn, vision, neg   EXPLORATORY LAPAROTOMY     SHOULDER SURGERY     TONSILLECTOMY     Patient Active Problem List   Diagnosis Date Noted   Sciatica of right side 07/31/2021   Bipolar 1 disorder, manic, full remission (HCC) 07/31/2021   Borderline personality disorder (HCC) 07/31/2021   History of autism spectrum disorder 07/31/2021   History of posttraumatic stress disorder (PTSD) 07/31/2021   Drug reaction 07/31/2021   Uncontrolled type 1 diabetes mellitus with hyperglycemia, with long-term current use of insulin (HCC) 07/27/2021   AKI (acute kidney injury) (HCC) 07/27/2021   UTI (urinary tract infection) 07/27/2021   Dizziness 09/13/2020   Memory loss or impairment 09/13/2020   Tremor 09/13/2020   Migraine headache 01/25/2020   Osteoporosis 10/26/2019   Irritable bowel syndrome 08/24/2019   Bipolar disorder (HCC) 08/03/2019   Esophageal reflux 08/03/2019   Mixed hyperlipidemia 07/11/2019   Type 2 diabetes mellitus with kidney complication, with  long-term current use of insulin (HCC) 03/15/2019   Chronic pain syndrome 03/15/2019   Dyslipidemia 03/15/2019   Bipolar 1 disorder, depressed, severe (HCC) 03/14/2019   CKD (chronic kidney disease), stage III (HCC) 10/16/2018   High risk medication use 05/15/2018    ONSET DATE: Pt reports approximately 1 year ago (August 2022)  REFERRING DIAG: H81.10 (ICD-10-CM) - BPPV (benign paroxysmal positional vertigo)  THERAPY DIAG:  Dizziness and giddiness  Unsteadiness on feet  Difficulty in walking, not elsewhere classified  Rationale for Evaluation and Treatment Rehabilitation  SUBJECTIVE:   SUBJECTIVE STATEMENT: Pt able to do VOR HEP for full 30 sec, and reports she has been able to walk at home without her can or walker. She reports no Falls/stumbles, only occ difficulty with pivoting. Pt reports overall dizziness level has been good and reports no dizziness currently.  Pt accompanied by: self  PAIN:  Are you having pain? Not reported, however, no head-related pain: NPRS scale: not reported Pain location:  Pain description:  Aggravating factors:  Relieving factors:   PERTINENT HISTORY: Pt reports onset of dizziness/unsteadiness about a year ago. Her symptoms started with spinning that lasted 30-45 seconds and occurred with movements such as turning her head, turning, bending over, leaning back. She reports falling 2x because of her symptoms. She had HH therapy during which her vertigo resolved. However, pt states "I still feel off balance.  Some days it's really bad. I don't go anywhere or do anything." Pt was told that she has BPPV. She feels like the "world is tipped slightly." Her current symptoms are now more of a feeling of unsteadiness and on bad days she can feel light-headed. 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 Does not have hearing aids. Did not have a cold prior to symptom onset.  Did see ENT without findings, does not report VNG testing. Pt reports she does not have migraines often currently, last one was a month ago. Has not noticed a connection between migraines and dizziness. Pt does taken meclizine, takes 1x/day, and has taken it today. Pt has had sciatica for 6 years felt in R hip. Works with pain management doctor.  Per chart other PMH significant for migraines, memory loss, bipolar disorder, diabetes, chronic pain syndrome, UTI, CKD stage III, osteoporosis, sciatica R side, borderline personality disorder, history of autism spectrum disorder, history of posttraumatic stress disorder.    PAIN: taken at eval Are you having pain? Yes: NPRS scale: 4/10 Pain location: R hip Pain description: radiates from low back into hip, occ down into leg Aggravating factors:   Relieving factors:    PRECAUTIONS: Fall  WEIGHT BEARING RESTRICTIONS No  FALLS: Has patient fallen in last 6 months? Yes. Number of falls 2; reports tends to lose balance to R side  LIVING ENVIRONMENT: Lives with: lives with their family and lives with their spouse Lives in: House/apartment Stairs: No Has following equipment at home: Single point cane, Environmental consultant - 4 wheeled, shower chair, and Grab bars  PLOF: Independent  PATIENT GOALS "To be able to walk without any assistance."  Wants to be ready to walk at the [facility] in October.  OBJECTIVE: taken at evaluation unless otherwise specified  DIAGNOSTIC FINDINGS:   07/27/21 MR BRAIN WO CONTRAST "IMPRESSION: No acute intracranial process. No evidence of acute or subacute infarct."    07/27/21 CT HEAD WO CONTRAST "IMPRESSION: 1. No acute intracranial pathology. 2. Chronic microangiopathy and cerebral atrophy. 3. Chronic right maxillary sinusitis."  [place] 09/24/21: "- Carotid US was wnl. "  COGNITION: Overall cognitive status: Within functional limits for tasks assessed   SENSATION: Did report tingling in her feet when she experienced vertigo,  reports lasted duration of vertigo 4-5 months. Was tested for neuropathy found not to have it.  EDEMA:  No swelling.    POSTURE: rounded shoulders   Cervical ROM:  taken 11/07/2021 Cervical ROM assessment: AROM: Ext 30 deg. Flex 35 deg. Felt initial pain with first rep prior to measurement Lateral flexion deg. 35 deg B. Painful with initial AROM but not with measurement Rotation 55 deg B. Was painful with initial ROM assessment but not with measurement. Comments: general impairment of all cervical ROM.     STRENGTH: deferred, however did require assist throughout for bed mobility with testing on plinth, to go from sidelye>sit>stand, also impacted by dizziness  TRANSFERS: Assistive device utilized: Single point cane  Sit to stand: CGA close CGA to complete  Stand to sit: Complete Independence   GAIT: Gait pattern:  cautious, decreased gait speed due to unsteadiness, uses SPC, PT provides close CGA Distance walked: distance of clinic  Assistive device utilized: Single point cane Level of assistance: CGA Comments: limited due to dizziness    PATIENT SURVEYS:  ABC scale 31.3% FOTO 50 (goal score 58) DHI: 54, indicating moderate perception  of handicap taken 10/24/2021   VESTIBULAR ASSESSMENT taken at evaluation unless specified otherwise   GENERAL OBSERVATION: Pt very cautious ambulator due to unsteadiness.    SYMPTOM BEHAVIOR:   Subjective history: see above, onset one year ago, reports vertigo resolves and now more unsteadiness, however pt takes Meclizine 1x/day.   Non-Vestibular symptoms: headaches, tinnitus, and migraine symptoms   Type of dizziness: Unsteady with head/body turns, Lightheadedness/Faint, and "World moves"   Progression of symptoms: better, however pt reports taking meclizine 1x/day   OCULOMOTOR EXAM:   Ocular Alignment: normal   Ocular ROM: No Limitations   Gaze-Induced Nystagmus: absent   Smooth Pursuits: saccades intermittent saccades, does  report feels a bit difficult to focus on target and makes pt dizzy   Saccades:  Reports testing makes her feel off balance, as if she is spinning   Vergence: WNL    TODAY's TREATMENT:   Gait belt donned and CGA provided throughout unless specified otherwise   NMR- In // bars Amb with pivot turns B directions. No symptoms turning to L, but does report feeling moderate dizziness turning to the R. Symptoms last for 2 seconds.  Amb with horizontal visual scanning of mult-color ball followed by 180 turns. Dizziness reached 3/10, but resolves quickly with rest interval.  Seated: VORx1 horiz and vertical head turns with busy backgrounds (2 different ones used, one more complex), mostly focused on horizontal head turns in 30 sec bouts. No dizziness, but does report "x" or background looks as if it is moving.  Standing on airex, gait belt donned, at support surface: NBOS EC 5x30 sec - PT provides up to mod assist to correct for LOB NBOS Vertical head turns 2x20 NBOS Horiz head turns 2x20 Comments: Pt feels very unsteady following interventions, but reports improves quickly with rest  SLB 2x30 sec each LE; intermittent UE support Tandem stance 2x30 sec each LE Tandem gait in // bars 8x improved with reps. She reports this made her feel a little off.   TherEx- Seated  UT stretch 60 sec B Cervical Rotation stretch 60 sec B Cervical ext. Stretch 60 sec Cervical flex stretch 60 sec   PATIENT EDUCATION: Education details: Pt educated throughout session about proper posture and technique with exercises. Improved exercise technique, movement at target joints, use of target muscles after min to mod verbal, visual, tactile cues.   Person educated: Patient Education method: Explanation, Demonstration, Tactile cues, Verbal cues, and Handouts Education comprehension: verbalized understanding, returned demonstration, verbal cues required, and needs further education  HOME EXERCISE PROGRAM:  No  updates today, pt to continue HEP as previously given  Added to HEP 11/07/21: Access Code: [email or web address] URL: [email or web address] Date: 11/07/2021 Prepared by: Temple Pacini  Exercises - Seated Cervical Sidebending Stretch  - 1 x daily - 7 x weekly - 2 sets - 2 reps - 30 sec hold  Initiated 10/24/2021 Access Code: [email or web address] URL: [email or web address] Date: 10/24/2021 Prepared by: Temple Pacini  Exercises - Seated Gaze Stabilization with Head Rotation  - 1 x daily - 7 x weekly - 4 sets - 1 reps - 30 sec hold - Imaginary Target with Head Nod  - 1 x daily - 7 x weekly - 4 sets - 1 reps - 30 sec hold *correction to seated gaze stabilization with head nod    GOALS: Goals reviewed with patient? Yes - goals reviewed  SHORT TERM GOALS: Target date: 12/26/2021   Patient will be independent in home exercise program to improve strength/mobility for better functional  independence with ADLs. Baseline: To be initiated next 1-2 visits; 9/6: initiated  Goal status: INITIAL   LONG TERM GOALS: Target date: 02/06/2022    Patient will increase FOTO score to equal to or greater than  58   to demonstrate statistically significant improvement in mobility and quality of life.  Baseline: 50 Goal status: INITIAL  2.  Pt will report at least 50% improvement in dizziness and unsteadiness symptoms with daily activities and gait in order to improve QOL and decrease fall risk. Baseline: Pt dizzy with ADLs, gait at baseline Goal status: INITIAL  3. Patient will increase ABC scale score >80% to demonstrate better functional mobility and better confidence with ADLs.  Baseline: 31.3% Goal status: INITIAL  4.  Patient will reduce dizziness handicap inventory score to <50, for less dizziness with ADLs and increased safety with home and work tasks.  Baseline: 9/6: 54 Goal status: INITIAL  5.  Patient will increase dynamic gait index score to >19/24 as to demonstrate reduced fall risk and  improved dynamic gait balance for better safety with community/home ambulation.   Baseline: to be tested next 1-2 visits; 9/6: 13/24 Goal status: INITIAL   ASSESSMENT:  CLINICAL IMPRESSION: Pt presents with improved symptoms, reporting no recent vertigo and improved steadiness, still difficulty with pivot turns. Pt able to perform challenging busy background VOR without dizziness today. She still exhibits decreased postural stability with all turning and dynamic horizontal head turn interventions, however. Will continue to focus on these deficits. The pt will benefit from further skilled PT to improve balance confidence, balance, gait, and to decrease dizziness symptoms with movement.     OBJECTIVE IMPAIRMENTS Abnormal gait, decreased balance, decreased mobility, difficulty walking, decreased strength, dizziness, improper body mechanics, postural dysfunction, and pain.   ACTIVITY LIMITATIONS carrying, bending, standing, stairs, transfers, bed mobility, reach over head, and locomotion level  PARTICIPATION LIMITATIONS: meal prep, cleaning, laundry, driving, shopping, community activity, and yard work  PERSONAL FACTORS Age, Sex, Time since onset of injury/illness/exacerbation, and 3+ comorbidities: PMH significant for migraines, memory loss, bipolar disorder, diabetes, chronic pain syndrome, UTI, CKD stage III, osteoporosis, sciatica R side, borderline personality disorder, history of autism spectrum disorder, history of posttraumatic stress disorder.   are also affecting patient's functional outcome.   REHAB POTENTIAL: Good  CLINICAL DECISION MAKING: Evolving/moderate complexity  EVALUATION COMPLEXITY: Moderate   PLAN: PT FREQUENCY: 2x/week  PT DURATION: 12 weeks  PLANNED INTERVENTIONS: Therapeutic exercises, Therapeutic activity, Neuromuscular re-education, Balance training, Gait training, Patient/Family education, Self Care, Joint mobilization, Stair training, Vestibular training,  Canalith repositioning, Visual/preceptual remediation/compensation, Orthotic/Fit training, DME instructions, Dry Needling, Electrical stimulation, Wheelchair mobility training, Spinal mobilization, Cryotherapy, Moist heat, Splintting, Taping, Traction, Ultrasound, Manual therapy, and Re-evaluation  PLAN FOR NEXT SESSION: UE MMT testing, balance, habituation interventions, VOR, manual therapy and stretching as able, continue plan   Baird Kay, PT 11/14/2021, 2:13 PM

## 2021-11-20 ENCOUNTER — Ambulatory Visit: Payer: Medicare PPO | Attending: Neurology

## 2021-11-20 DIAGNOSIS — R42 Dizziness and giddiness: Secondary | ICD-10-CM | POA: Insufficient documentation

## 2021-11-20 DIAGNOSIS — M6281 Muscle weakness (generalized): Secondary | ICD-10-CM | POA: Insufficient documentation

## 2021-11-20 DIAGNOSIS — R2681 Unsteadiness on feet: Secondary | ICD-10-CM | POA: Diagnosis present

## 2021-11-20 NOTE — Therapy (Signed)
OUTPATIENT PHYSICAL THERAPY VESTIBULAR TREATMENT NOTE     Patient Name: Marilyn Davis MRN: [id number] DOB:12/13/1955, 66 y.o., female Today's Date: 11/20/2021  PCP: No PCP listed in system REFERRING PROVIDER: Morene Crocker, MD   PT End of Session - 11/20/21 1507     Visit Number 6    Number of Visits 25    Date for PT Re-Evaluation 01/09/22    PT Start Time 1441    PT Stop Time 1521    PT Time Calculation (min) 40 min    Equipment Utilized During Treatment Gait belt    Activity Tolerance Patient tolerated treatment well    Behavior During Therapy WFL for tasks assessed/performed                 Past Medical History:  Diagnosis Date   Bipolar disorder (HCC)    Diabetes mellitus without complication (HCC)    Sciatica    Past Surgical History:  Procedure Laterality Date   ABDOMINAL HYSTERECTOMY     BREAST BIOPSY Right 09/30/2019   9:00, 5cmfn, Q shape, neg   BREAST BIOPSY Right 09/30/2019   9:00, 9cmfn, vision, neg   EXPLORATORY LAPAROTOMY     SHOULDER SURGERY     TONSILLECTOMY     Patient Active Problem List   Diagnosis Date Noted   Sciatica of right side 07/31/2021   Bipolar 1 disorder, manic, full remission (HCC) 07/31/2021   Borderline personality disorder (HCC) 07/31/2021   History of autism spectrum disorder 07/31/2021   History of posttraumatic stress disorder (PTSD) 07/31/2021   Drug reaction 07/31/2021   Uncontrolled type 1 diabetes mellitus with hyperglycemia, with long-term current use of insulin (HCC) 07/27/2021   AKI (acute kidney injury) (HCC) 07/27/2021   UTI (urinary tract infection) 07/27/2021   Dizziness 09/13/2020   Memory loss or impairment 09/13/2020   Tremor 09/13/2020   Migraine headache 01/25/2020   Osteoporosis 10/26/2019   Irritable bowel syndrome 08/24/2019   Bipolar disorder (HCC) 08/03/2019   Esophageal reflux 08/03/2019   Mixed hyperlipidemia 07/11/2019   Type 2 diabetes mellitus with kidney complication,  with long-term current use of insulin (HCC) 03/15/2019   Chronic pain syndrome 03/15/2019   Dyslipidemia 03/15/2019   Bipolar 1 disorder, depressed, severe (HCC) 03/14/2019   CKD (chronic kidney disease), stage III (HCC) 10/16/2018   High risk medication use 05/15/2018    ONSET DATE: Pt reports approximately 1 year ago (August 2022)  REFERRING DIAG: H81.10 (ICD-10-CM) - BPPV (benign paroxysmal positional vertigo)  THERAPY DIAG:  Dizziness and giddiness  Unsteadiness on feet  Muscle weakness (generalized)  Rationale for Evaluation and Treatment Rehabilitation  SUBJECTIVE:   SUBJECTIVE STATEMENT: Pt reports she is doing really well. Still having some difficulty with pivoting, but this has improved too. She states, "When I do have to walk a long ways...towards the end of it I start getting a little off-balance."   Pt accompanied by: self  PAIN:  Are you having pain? Not reported, however, no head-related pain: NPRS scale: not reported Pain location:  Pain description:  Aggravating factors:  Relieving factors:   PERTINENT HISTORY: Pt reports onset of dizziness/unsteadiness about a year ago. Her symptoms started with spinning that lasted 30-45 seconds and occurred with movements such as turning her head, turning, bending over, leaning back. She reports falling 2x because of her symptoms. She had HH therapy during which her vertigo resolved. However, pt states "I still feel off balance. Some days it's really bad. I don't go anywhere  or do anything." Pt was told that she has BPPV. She feels like the "world is tipped slightly." Her current symptoms are now more of a feeling of unsteadiness and on bad days she can feel light-headed. 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 Does not have hearing aids. Did not have a cold prior to symptom onset. Did see ENT without findings, does not report VNG testing.  Pt reports she does not have migraines often currently, last one was a month ago. Has not noticed a connection between migraines and dizziness. Pt does taken meclizine, takes 1x/day, and has taken it today. Pt has had sciatica for 6 years felt in R hip. Works with pain management doctor.  Per chart other PMH significant for migraines, memory loss, bipolar disorder, diabetes, chronic pain syndrome, UTI, CKD stage III, osteoporosis, sciatica R side, borderline personality disorder, history of autism spectrum disorder, history of posttraumatic stress disorder.    PAIN: taken at eval Are you having pain? Yes: NPRS scale: 4/10 Pain location: R hip Pain description: radiates from low back into hip, occ down into leg Aggravating factors:   Relieving factors:    PRECAUTIONS: Fall  WEIGHT BEARING RESTRICTIONS No  FALLS: Has patient fallen in last 6 months? Yes. Number of falls 2; reports tends to lose balance to R side  LIVING ENVIRONMENT: Lives with: lives with their family and lives with their spouse Lives in: House/apartment Stairs: No Has following equipment at home: Single point cane, Environmental consultant - 4 wheeled, shower chair, and Grab bars  PLOF: Independent  PATIENT GOALS "To be able to walk without any assistance."  Wants to be ready to walk at the [facility] in October.  OBJECTIVE: taken at evaluation unless otherwise specified  DIAGNOSTIC FINDINGS:   07/27/21 MR BRAIN WO CONTRAST "IMPRESSION: No acute intracranial process. No evidence of acute or subacute infarct."    07/27/21 CT HEAD WO CONTRAST "IMPRESSION: 1. No acute intracranial pathology. 2. Chronic microangiopathy and cerebral atrophy. 3. Chronic right maxillary sinusitis."  [place] 09/24/21: "- Carotid US was wnl. "  COGNITION: Overall cognitive status: Within functional limits for tasks assessed   SENSATION: Did report tingling in her feet when she experienced vertigo, reports lasted duration of vertigo 4-5 months. Was tested  for neuropathy found not to have it.  EDEMA:  No swelling.    POSTURE: rounded shoulders   Cervical ROM:  taken 11/07/2021 Cervical ROM assessment: AROM: Ext 30 deg. Flex 35 deg. Felt initial pain with first rep prior to measurement Lateral flexion deg. 35 deg B. Painful with initial AROM but not with measurement Rotation 55 deg B. Was painful with initial ROM assessment but not with measurement. Comments: general impairment of all cervical ROM.     STRENGTH: deferred, however did require assist throughout for bed mobility with testing on plinth, to go from sidelye>sit>stand, also impacted by dizziness  TRANSFERS: Assistive device utilized: Single point cane  Sit to stand: CGA close CGA to complete  Stand to sit: Complete Independence   GAIT: Gait pattern:  cautious, decreased gait speed due to unsteadiness, uses SPC, PT provides close CGA Distance walked: distance of clinic  Assistive device utilized: Single point cane Level of assistance: CGA Comments: limited due to dizziness    PATIENT SURVEYS:  ABC scale 31.3% FOTO 50 (goal score 58) DHI: 54, indicating moderate perception of handicap taken 10/24/2021   VESTIBULAR ASSESSMENT taken  at evaluation unless specified otherwise   GENERAL OBSERVATION: Pt very cautious ambulator due to unsteadiness.    SYMPTOM BEHAVIOR:   Subjective history: see above, onset one year ago, reports vertigo resolves and now more unsteadiness, however pt takes Meclizine 1x/day.   Non-Vestibular symptoms: headaches, tinnitus, and migraine symptoms   Type of dizziness: Unsteady with head/body turns, Lightheadedness/Faint, and "World moves"   Progression of symptoms: better, however pt reports taking meclizine 1x/day   OCULOMOTOR EXAM:   Ocular Alignment: normal   Ocular ROM: No Limitations   Gaze-Induced Nystagmus: absent   Smooth Pursuits: saccades intermittent saccades, does report feels a bit difficult to focus on target and makes pt  dizzy   Saccades:  Reports testing makes her feel off balance, as if she is spinning   Vergence: WNL    TODAY's TREATMENT:   Gait belt donned and CGA provided throughout unless specified otherwise   NMR-  Amb. around cones with and without SPC (but with CGA) x multiple reps. One initial instance of requiring step-strategy, one instance of brief unsteadiness felt with turning head, pt able to self-correct.  In // bars Amb with pivot turns B directions, only one instance where pt reports symptoms came on "just a teeny bit" - 3 instances of mild LOB where pt able to self-correct. Reports dizziness 2-3/10  Static standing with trunk twists and visually following multicolored ball (horizontal head turn) Dizziness reaches a 2/10  360 turns in // bars with TC on bar x 2 rounds - dizziness >4/10. Requires seated recovery interval (pt performs therex below for recovery)  SBL 2x30 sec each LE. Able to sustain at least 10 sec each LE. Does use intermittent UE assist otherwise  Amb. 1x148 ft without AD, with CGA, to assess balance with ambulation. Pt exhibits no unsteadiness, but reports near end of lap feeling as if the ground were at an angle, this could possibly be due in part to fatigue as this intervention followed nustep.  TherEx- Seated  UT stretch 60 sec B Cervical Rotation stretch 60 sec B Cervical ext. Stretch 60 sec Cervical flex stretch 60 sec  Nustep interval training to promote LE mm and cardioresp endurance- cuing for SPM >60 then >70 to achieve therapeutic range Lvl 1 x 1 min Lvl 3 x 1 min Lvl 2 x 1 min Lvl 4 x 1 min Lvl 2 x 1 min  Lvl 3 x 1 min Lvl 2 x 1 min cuing to decrease speed gradually for cool-down. Pt did report some LE fatigue with intervention with lvl 4. PT adjusted intensity throughout to pt tolerance.   PATIENT EDUCATION: Education details: Pt educated throughout session about proper posture and technique with exercises. Improved exercise technique,  movement at target joints, use of target muscles after min to mod verbal, visual, tactile cues.   Person educated: Patient Education method: Explanation, Demonstration, Tactile cues, Verbal cues, and Handouts Education comprehension: verbalized understanding, returned demonstration, verbal cues required, and needs further education  HOME EXERCISE PROGRAM:  No updates today, pt to continue HEP as previously given  Added to HEP 11/07/21: Access Code: [email or web address] URL: [email or web address] Date: 11/07/2021 Prepared by: Temple Pacini  Exercises - Seated Cervical Sidebending Stretch  - 1 x daily - 7 x weekly - 2 sets - 2 reps - 30 sec hold  Initiated 10/24/2021 Access Code: [email or web address] URL: [email or web address] Date: 10/24/2021 Prepared by: Temple Pacini  Exercises - Seated Gaze Stabilization with Head Rotation  - 1 x daily - 7 x  weekly - 4 sets - 1 reps - 30 sec hold - Imaginary Target with Head Nod  - 1 x daily - 7 x weekly - 4 sets - 1 reps - 30 sec hold *correction to seated gaze stabilization with head nod    GOALS: Goals reviewed with patient? Yes - goals reviewed  SHORT TERM GOALS: Target date: 01/01/2022   Patient will be independent in home exercise program to improve strength/mobility for better functional independence with ADLs. Baseline: To be initiated next 1-2 visits; 9/6: initiated  Goal status: INITIAL   LONG TERM GOALS: Target date: 02/12/2022    Patient will increase FOTO score to equal to or greater than  58   to demonstrate statistically significant improvement in mobility and quality of life.  Baseline: 50 Goal status: INITIAL  2.  Pt will report at least 50% improvement in dizziness and unsteadiness symptoms with daily activities and gait in order to improve QOL and decrease fall risk. Baseline: Pt dizzy with ADLs, gait at baseline Goal status: INITIAL  3. Patient will increase ABC scale score >80% to demonstrate better functional  mobility and better confidence with ADLs.  Baseline: 31.3% Goal status: INITIAL  4.  Patient will reduce dizziness handicap inventory score to <50, for less dizziness with ADLs and increased safety with home and work tasks.  Baseline: 9/6: 54 Goal status: INITIAL  5.  Patient will increase dynamic gait index score to >19/24 as to demonstrate reduced fall risk and improved dynamic gait balance for better safety with community/home ambulation.   Baseline: to be tested next 1-2 visits; 9/6: 13/24 Goal status: INITIAL   ASSESSMENT:  CLINICAL IMPRESSION: Pt continues to progress toward goals. She was able to ambulate without AD in session around cones for turning and for 148 ft (with CGA). Pt with mild unsteadiness with navigating cones but no LOB when attempting without AD; she also improved with reps.  Pt most challenged by 360 deg and pivot turns, exhibiting greatest decrease in postural stability. The pt will benefit from further skilled PT to improve balance confidence, balance, gait, and to decrease dizziness symptoms with movement.     OBJECTIVE IMPAIRMENTS Abnormal gait, decreased balance, decreased mobility, difficulty walking, decreased strength, dizziness, improper body mechanics, postural dysfunction, and pain.   ACTIVITY LIMITATIONS carrying, bending, standing, stairs, transfers, bed mobility, reach over head, and locomotion level  PARTICIPATION LIMITATIONS: meal prep, cleaning, laundry, driving, shopping, community activity, and yard work  PERSONAL FACTORS Age, Sex, Time since onset of injury/illness/exacerbation, and 3+ comorbidities: PMH significant for migraines, memory loss, bipolar disorder, diabetes, chronic pain syndrome, UTI, CKD stage III, osteoporosis, sciatica R side, borderline personality disorder, history of autism spectrum disorder, history of posttraumatic stress disorder.   are also affecting patient's functional outcome.   REHAB POTENTIAL: Good  CLINICAL  DECISION MAKING: Evolving/moderate complexity  EVALUATION COMPLEXITY: Moderate   PLAN: PT FREQUENCY: 2x/week  PT DURATION: 12 weeks  PLANNED INTERVENTIONS: Therapeutic exercises, Therapeutic activity, Neuromuscular re-education, Balance training, Gait training, Patient/Family education, Self Care, Joint mobilization, Stair training, Vestibular training, Canalith repositioning, Visual/preceptual remediation/compensation, Orthotic/Fit training, DME instructions, Dry Needling, Electrical stimulation, Wheelchair mobility training, Spinal mobilization, Cryotherapy, Moist heat, Splintting, Taping, Traction, Ultrasound, Manual therapy, and Re-evaluation  PLAN FOR NEXT SESSION: balance, habituation interventions, VOR, manual therapy and stretching as able, LE strength/endurance,  continue plan   Baird Kay, PT 11/20/2021, 3:32 PM

## 2021-11-21 ENCOUNTER — Ambulatory Visit: Payer: Medicare PPO

## 2021-11-22 ENCOUNTER — Telehealth: Payer: Self-pay

## 2021-11-22 NOTE — Telephone Encounter (Signed)
I did not continue the propranolol for this patient.  Please let patient know.  I do not see any record of prescribing it.

## 2021-11-22 NOTE — Telephone Encounter (Signed)
pt husband called left message that he needs to know if she is still supposed to take the propranol 20mg  states that she doesn't have any refills left.

## 2021-11-23 NOTE — Telephone Encounter (Signed)
Pt husband notified 

## 2021-11-27 ENCOUNTER — Ambulatory Visit: Payer: Medicare PPO

## 2021-11-27 DIAGNOSIS — R42 Dizziness and giddiness: Secondary | ICD-10-CM | POA: Diagnosis not present

## 2021-11-27 DIAGNOSIS — R2681 Unsteadiness on feet: Secondary | ICD-10-CM

## 2021-11-27 NOTE — Therapy (Signed)
OUTPATIENT PHYSICAL THERAPY VESTIBULAR TREATMENT NOTE     Patient Name: Marilyn Davis MRN: [id number] DOB:07-Nov-1955, 66 y.o., female Today's Date: 11/27/2021  PCP: No PCP listed in system REFERRING PROVIDER: Morene Crocker, MD   PT End of Session - 11/27/21 1201     Visit Number 7    Number of Visits 25    Date for PT Re-Evaluation 01/09/22    PT Start Time 1147    PT Stop Time 1232    PT Time Calculation (min) 45 min    Equipment Utilized During Treatment Gait belt    Activity Tolerance Patient tolerated treatment well    Behavior During Therapy WFL for tasks assessed/performed                  Past Medical History:  Diagnosis Date   Bipolar disorder (HCC)    Diabetes mellitus without complication (HCC)    Sciatica    Past Surgical History:  Procedure Laterality Date   ABDOMINAL HYSTERECTOMY     BREAST BIOPSY Right 09/30/2019   9:00, 5cmfn, Q shape, neg   BREAST BIOPSY Right 09/30/2019   9:00, 9cmfn, vision, neg   EXPLORATORY LAPAROTOMY     SHOULDER SURGERY     TONSILLECTOMY     Patient Active Problem List   Diagnosis Date Noted   Sciatica of right side 07/31/2021   Bipolar 1 disorder, manic, full remission (HCC) 07/31/2021   Borderline personality disorder (HCC) 07/31/2021   History of autism spectrum disorder 07/31/2021   History of posttraumatic stress disorder (PTSD) 07/31/2021   Drug reaction 07/31/2021   Uncontrolled type 1 diabetes mellitus with hyperglycemia, with long-term current use of insulin (HCC) 07/27/2021   AKI (acute kidney injury) (HCC) 07/27/2021   UTI (urinary tract infection) 07/27/2021   Dizziness 09/13/2020   Memory loss or impairment 09/13/2020   Tremor 09/13/2020   Migraine headache 01/25/2020   Osteoporosis 10/26/2019   Irritable bowel syndrome 08/24/2019   Bipolar disorder (HCC) 08/03/2019   Esophageal reflux 08/03/2019   Mixed hyperlipidemia 07/11/2019   Type 2 diabetes mellitus with kidney complication,  with long-term current use of insulin (HCC) 03/15/2019   Chronic pain syndrome 03/15/2019   Dyslipidemia 03/15/2019   Bipolar 1 disorder, depressed, severe (HCC) 03/14/2019   CKD (chronic kidney disease), stage III (HCC) 10/16/2018   High risk medication use 05/15/2018    ONSET DATE: Pt reports approximately 1 year ago (August 2022)  REFERRING DIAG: H81.10 (ICD-10-CM) - BPPV (benign paroxysmal positional vertigo)  THERAPY DIAG:  Dizziness and giddiness  Unsteadiness on feet  Rationale for Evaluation and Treatment Rehabilitation  SUBJECTIVE:   SUBJECTIVE STATEMENT: Pt reports she feels she's still improving, reports dizziness has been "very little." Pt ambulating with SPC and not feeling unsteady. Reports no more episodes of self-spinning. Pt states her sciatica is actually bothering her more now than her dizziness. She thinks pain is brought on by turning and walking a lot. She describes it as stabbing pain felt on R side from hip to just above knee (has had this before, chronic issue).   Pt accompanied by: self  PAIN:  Are you having pain? Sciatica pain: NPRS scale: not rated Pain location: LBP Pain description:  Aggravating factors:  Relieving factors:   PERTINENT HISTORY: Pt reports onset of dizziness/unsteadiness about a year ago. Her symptoms started with spinning that lasted 30-45 seconds and occurred with movements such as turning her head, turning, bending over, leaning back. She reports falling 2x because of  her symptoms. She had HH therapy during which her vertigo resolved. However, pt states "I still feel off balance. Some days it's really bad. I don't go anywhere or do anything." Pt was told that she has BPPV. She feels like the "world is tipped slightly." Her current symptoms are now more of a feeling of unsteadiness and on bad days she can feel light-headed. 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 Does not have hearing aids. Did not have a cold prior to symptom onset. Did see ENT without findings, does not report VNG testing. Pt reports she does not have migraines often currently, last one was a month ago. Has not noticed a connection between migraines and dizziness. Pt does taken meclizine, takes 1x/day, and has taken it today. Pt has had sciatica for 6 years felt in R hip. Works with pain management doctor.  Per chart other PMH significant for migraines, memory loss, bipolar disorder, diabetes, chronic pain syndrome, UTI, CKD stage III, osteoporosis, sciatica R side, borderline personality disorder, history of autism spectrum disorder, history of posttraumatic stress disorder.    PAIN: taken at eval Are you having pain? Yes: NPRS scale: 4/10 Pain location: R hip Pain description: radiates from low back into hip, occ down into leg Aggravating factors:   Relieving factors:    PRECAUTIONS: Fall  WEIGHT BEARING RESTRICTIONS No  FALLS: Has patient fallen in last 6 months? Yes. Number of falls 2; reports tends to lose balance to R side  LIVING ENVIRONMENT: Lives with: lives with their family and lives with their spouse Lives in: House/apartment Stairs: No Has following equipment at home: Single point cane, Environmental consultant - 4 wheeled, shower chair, and Grab bars  PLOF: Independent  PATIENT GOALS "To be able to walk without any assistance."  Wants to be ready to walk at the [facility] in October.  OBJECTIVE: taken at evaluation unless otherwise specified  DIAGNOSTIC FINDINGS:   07/27/21 MR BRAIN WO CONTRAST "IMPRESSION: No acute intracranial process. No evidence of acute or subacute infarct."    07/27/21 CT HEAD WO CONTRAST "IMPRESSION: 1. No acute intracranial pathology. 2. Chronic microangiopathy and cerebral atrophy. 3. Chronic right maxillary sinusitis."  [place] 09/24/21: "- Carotid US was wnl. "  COGNITION: Overall cognitive status: Within functional limits for  tasks assessed   SENSATION: Did report tingling in her feet when she experienced vertigo, reports lasted duration of vertigo 4-5 months. Was tested for neuropathy found not to have it.  EDEMA:  No swelling.    POSTURE: rounded shoulders   Cervical ROM:  taken 11/07/2021 Cervical ROM assessment: AROM: Ext 30 deg. Flex 35 deg. Felt initial pain with first rep prior to measurement Lateral flexion deg. 35 deg B. Painful with initial AROM but not with measurement Rotation 55 deg B. Was painful with initial ROM assessment but not with measurement. Comments: general impairment of all cervical ROM.     STRENGTH: deferred, however did require assist throughout for bed mobility with testing on plinth, to go from sidelye>sit>stand, also impacted by dizziness  TRANSFERS: Assistive device utilized: Single point cane  Sit to stand: CGA close CGA to complete  Stand to sit: Complete Independence   GAIT: Gait pattern:  cautious, decreased gait speed due to unsteadiness, uses SPC, PT provides close CGA Distance walked: distance of clinic  Assistive device utilized: Single point cane Level of assistance: CGA Comments: limited due to dizziness  PATIENT SURVEYS:  ABC scale 31.3% FOTO 50 (goal score 58) DHI: 54, indicating moderate perception of handicap taken 10/24/2021   VESTIBULAR ASSESSMENT taken at evaluation unless specified otherwise   GENERAL OBSERVATION: Pt very cautious ambulator due to unsteadiness.    SYMPTOM BEHAVIOR:   Subjective history: see above, onset one year ago, reports vertigo resolves and now more unsteadiness, however pt takes Meclizine 1x/day.   Non-Vestibular symptoms: headaches, tinnitus, and migraine symptoms   Type of dizziness: Unsteady with head/body turns, Lightheadedness/Faint, and "World moves"   Progression of symptoms: better, however pt reports taking meclizine 1x/day   OCULOMOTOR EXAM:   Ocular Alignment: normal   Ocular ROM: No  Limitations   Gaze-Induced Nystagmus: absent   Smooth Pursuits: saccades intermittent saccades, does report feels a bit difficult to focus on target and makes pt dizzy   Saccades:  Reports testing makes her feel off balance, as if she is spinning   Vergence: WNL    TODAY's TREATMENT:   Gait belt donned and CGA provided throughout unless specified otherwise   NMR-  Standing VORx1, busy background, vertical and horizontal head turns 30 sec of each performed in NBOS each - no dizziness.   TherAct- Goal reassessment completed as pt reports significant symptom improvement. See goal section below for full details.  FOTO: 58 DGI: 19/24   Instructed pt in advanced HEP, safety precautions with HEP, and PT plan.   PATIENT EDUCATION: Education details: Pt educated throughout session about proper posture and technique with exercises. Improved exercise technique, movement at target joints, use of target muscles after min to mod verbal, visual, tactile cues. HEP, plan.   Person educated: Patient Education method: Explanation, Demonstration, Tactile cues, Verbal cues, and Handouts Education comprehension: verbalized understanding, returned demonstration, verbal cues required, and needs further education  HOME EXERCISE PROGRAM:   11/27/21: Access Code: [email or web address] URL: [email or web address] Date: 11/27/2021 Prepared by: Temple Pacini  Exercises - Single Leg Stance with Support  - 1 x daily - 7 x weekly - 1 sets - 2 reps - 30 hold - Corner Balance Feet Together: Eyes Open With Head Turns  - 1 x daily - 5 x weekly - 3 sets - 10 reps *instructed pt to additionally place chair in front of her - Walking with Head Rotation  - 1 x daily - 5 x weekly - 3 sets - 10 reps *instructed pt to perform with RW - Walking with Head Nod  - 1 x daily - 5 x weekly - 3 sets - 10 reps *instructed pt to perform with RW - Turning in Corner 360  - 1 x daily - 5 x weekly - 2 sets - 10 reps *instructed pt  to perform with chair in front of her  Added to HEP 11/07/21: Access Code: [email or web address] URL: [email or web address] Date: 11/07/2021 Prepared by: Temple Pacini  Exercises - Seated Cervical Sidebending Stretch  - 1 x daily - 7 x weekly - 2 sets - 2 reps - 30 sec hold  Initiated 10/24/2021 Access Code: [email or web address] URL: [email or web address] Date: 10/24/2021 Prepared by: Temple Pacini  Exercises - Seated Gaze Stabilization with Head Rotation  - 1 x daily - 7 x weekly - 4 sets - 1 reps - 30 sec hold - Imaginary Target with Head Nod  - 1 x daily - 7 x weekly - 4 sets - 1 reps - 30 sec hold *correction to seated gaze stabilization with head nod     GOALS: Goals reviewed with patient? Yes - goals  reviewed  SHORT TERM GOALS: Target date: 01/08/2022   Patient will be independent in home exercise program to improve strength/mobility for better functional independence with ADLs. Baseline: To be initiated next 1-2 visits; 9/6: initiated; 10/10: advanced HEP Goal status: PARTIALLY MET   LONG TERM GOALS: Target date: 02/19/2022    Patient will increase FOTO score to equal to or greater than  58   to demonstrate statistically significant improvement in mobility and quality of life.  Baseline: 50; 10/10: 58 Goal status: MET  2.  Pt will report at least 50% improvement in dizziness and unsteadiness symptoms with daily activities and gait in order to improve QOL and decrease fall risk. Baseline: Pt dizzy with ADLs, gait at baseline; 10/10: 60% improvement Goal status: MET  3. Patient will increase ABC scale score >80% to demonstrate better functional mobility and better confidence with ADLs.  Baseline: 31.3%; 10/10: 63.125  Goal status: IN PROGRESS  4.  Patient will reduce dizziness handicap inventory score to <50, for less dizziness with ADLs and increased safety with home and work tasks.  Baseline: 9/6: 54; 10/10: 10 Goal status: MET  5.  Patient will increase dynamic  gait index score to >19/24 as to demonstrate reduced fall risk and improved dynamic gait balance for better safety with community/home ambulation.   Baseline: to be tested next 1-2 visits; 9/6: 13/24; 10/10: 19/24 Goal status: PARTIALLY MET   ASSESSMENT:  CLINICAL IMPRESSION: Goals reassessed as pt reports significant improvement in her dizziness symptoms and stability with gait. She has now met 3 goals, partially met 2 goals, and has made progress on remaining therapy goal. This indicates improvement in perception of disability due to dizziness, improved balance confidence, and improved balance with decreased fall risk. HEP advanced today and pt to trial period away from PT for a few weeks to determine if she can continue making gains independently or if she requires further PT assessment and treatment. Pt agreeable to this plan. The pt will benefit from further skilled PT to improve balance confidence, balance, gait, and to decrease dizziness symptoms with movement.     OBJECTIVE IMPAIRMENTS Abnormal gait, decreased balance, decreased mobility, difficulty walking, decreased strength, dizziness, improper body mechanics, postural dysfunction, and pain.   ACTIVITY LIMITATIONS carrying, bending, standing, stairs, transfers, bed mobility, reach over head, and locomotion level  PARTICIPATION LIMITATIONS: meal prep, cleaning, laundry, driving, shopping, community activity, and yard work  PERSONAL FACTORS Age, Sex, Time since onset of injury/illness/exacerbation, and 3+ comorbidities: PMH significant for migraines, memory loss, bipolar disorder, diabetes, chronic pain syndrome, UTI, CKD stage III, osteoporosis, sciatica R side, borderline personality disorder, history of autism spectrum disorder, history of posttraumatic stress disorder.   are also affecting patient's functional outcome.   REHAB POTENTIAL: Good  CLINICAL DECISION MAKING: Evolving/moderate complexity  EVALUATION COMPLEXITY:  Moderate   PLAN: PT FREQUENCY: 2x/week  PT DURATION: 12 weeks  PLANNED INTERVENTIONS: Therapeutic exercises, Therapeutic activity, Neuromuscular re-education, Balance training, Gait training, Patient/Family education, Self Care, Joint mobilization, Stair training, Vestibular training, Canalith repositioning, Visual/preceptual remediation/compensation, Orthotic/Fit training, DME instructions, Dry Needling, Electrical stimulation, Wheelchair mobility training, Spinal mobilization, Cryotherapy, Moist heat, Splintting, Taping, Traction, Ultrasound, Manual therapy, and Re-evaluation  PLAN FOR NEXT SESSION: balance, habituation interventions, VOR, manual therapy and stretching as able, LE strength/endurance,  continue plan   Baird Kay, PT 11/27/2021, 5:21 PM

## 2021-12-04 ENCOUNTER — Ambulatory Visit: Payer: Medicare PPO

## 2021-12-11 ENCOUNTER — Ambulatory Visit: Payer: Medicare PPO

## 2021-12-18 ENCOUNTER — Ambulatory Visit: Payer: Medicare PPO

## 2021-12-25 ENCOUNTER — Telehealth: Payer: Self-pay

## 2021-12-25 ENCOUNTER — Ambulatory Visit: Payer: Medicare PPO

## 2021-12-25 NOTE — Telephone Encounter (Signed)
I did not prescribe Fenofibrate for this patient.

## 2021-12-25 NOTE — Telephone Encounter (Signed)
pt needs a refill on the fenofibrate sent to the [facility] for 90 day supply

## 2021-12-25 NOTE — Telephone Encounter (Signed)
called no answer and no voice mail set up

## 2021-12-25 NOTE — Telephone Encounter (Signed)
Please advise to contact primary care.

## 2021-12-25 NOTE — Telephone Encounter (Signed)
pt husband states that you was suppose to take over the care ot that medication.

## 2021-12-26 ENCOUNTER — Telehealth: Payer: Self-pay

## 2021-12-26 NOTE — Telephone Encounter (Signed)
I am not the provider who prescribed those medications , wrong provider.

## 2021-12-26 NOTE — Telephone Encounter (Signed)
Please advise to talk to primary care.

## 2021-12-26 NOTE — Telephone Encounter (Signed)
Advised pt to call pcp or the last provider that prescribed medication she voiced understanding

## 2021-12-26 NOTE — Telephone Encounter (Signed)
Received a fax from [facility] pharmacy requesting  refill for the following medications:  Meclizine 25mg , Montelukast10mg , Fenofibrate Micronized 200mg . Please advise

## 2021-12-26 NOTE — Telephone Encounter (Signed)
Spoke to patient  she stated that those were actually prescribed by her previous psychiatrist I asked patient if she discussed with Dr Elna Breslow at her last appt  about filling the medications she said no and made her aware that all I can do is send another message and see what Dr Elna Breslow decides. Please advise

## 2021-12-28 ENCOUNTER — Ambulatory Visit (INDEPENDENT_AMBULATORY_CARE_PROVIDER_SITE_OTHER): Payer: Medicare PPO | Admitting: Psychiatry

## 2021-12-28 ENCOUNTER — Telehealth: Payer: Self-pay

## 2021-12-28 ENCOUNTER — Encounter: Payer: Self-pay | Admitting: Psychiatry

## 2021-12-28 VITALS — BP 126/85 | HR 84 | Temp 98.3°F | Ht 65.25 in | Wt 215.2 lb

## 2021-12-28 DIAGNOSIS — F840 Autistic disorder: Secondary | ICD-10-CM

## 2021-12-28 DIAGNOSIS — F603 Borderline personality disorder: Secondary | ICD-10-CM

## 2021-12-28 DIAGNOSIS — R251 Tremor, unspecified: Secondary | ICD-10-CM

## 2021-12-28 DIAGNOSIS — F3132 Bipolar disorder, current episode depressed, moderate: Secondary | ICD-10-CM | POA: Diagnosis not present

## 2021-12-28 DIAGNOSIS — Z8659 Personal history of other mental and behavioral disorders: Secondary | ICD-10-CM

## 2021-12-28 MED ORDER — BENZTROPINE MESYLATE 0.5 MG PO TABS: 0.5000 mg | ORAL_TABLET | Freq: Every day | ORAL | 0 refills | Status: DC | PRN

## 2021-12-28 MED ORDER — LAMOTRIGINE 25 MG PO TABS: 25.0000 mg | ORAL_TABLET | ORAL | 0 refills | Status: DC

## 2021-12-28 MED ORDER — ZOLPIDEM TARTRATE 5 MG PO TABS: 5.0000 mg | ORAL_TABLET | Freq: Every evening | ORAL | 0 refills | Status: DC | PRN

## 2021-12-28 NOTE — Telephone Encounter (Signed)
This medication was prescribed by primary care. Patient advised to taper off. Will not send refills at this time. Patient aware.

## 2021-12-28 NOTE — Patient Instructions (Signed)
Please taper off zolpidem at bedtime since currently you are on multiple medications at night which are brain depressants and can also cause you symptoms of sluggishness memory problems and other side effects.  You can start cutting the zolpidem dosage to half tablet some nights and skipping some nights and slowly come off of it.

## 2021-12-28 NOTE — Telephone Encounter (Signed)
received fax requesting a refill on the zolpidem 5mg 

## 2021-12-28 NOTE — Progress Notes (Signed)
BH MD OP Progress Note  12/28/2021 12:49 PM Marilyn Davis  MRN:  [id number]  Chief Complaint:  Chief Complaint  Patient presents with   Follow-up   Anxiety   Depression   HPI: Marilyn Davis is a 66 year old Caucasian female who has a history of bipolar disorder, borderline personality disorder, autism spectrum disorder, multiple medical problems including diabetes mellitus, sciatica, dizziness, tremors was evaluated in office today.  Patient presented along with her husband-Johnnie who provided collateral information.  Patient as well as husband reports patient as getting more and more depressed since Monday.  She was her usual self prior to that and since Monday she has been having episodes of depression.  The first 2 days she had depression episode for 2 hours or so at the end of the day however since the past 3 days she has been depressed throughout the day and is not getting any better.  Patient reports sadness, low motivation, low energy, low appetite.  Patient reports sleep is overall okay however reports when she wakes up in the morning she feels tired and wants to go back to sleep since the past 3 days.  Patient currently denies any dizziness or vertigo and reports physical therapy helped with the same.  Denies any suicidality, homicidality or perceptual disturbances.  Patient reports she has been compliant on all of her medications.  Does report tremors likely drug-induced, worse on and off.  May have tried Cogentin in the past which may have helped.  Also reports dry mouth likely from medications.  Denies any other concerns today.    Visit Diagnosis:    ICD-10-CM   1. Bipolar 1 disorder, depressed, moderate (HCC)  F31.32 lamoTRIgine (LAMICTAL) 25 MG tablet    2. Borderline personality disorder (HCC)  F60.3     3. Tremor  R25.1 benztropine (COGENTIN) 0.5 MG tablet   likely drug induced , psychotropics    4. Autism spectrum disorder  F84.0     5. History  of posttraumatic stress disorder (PTSD)  Z86.59       Past Psychiatric History: Reviewed past psychiatric history from progress note on 07/31/2021.  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 Past Medical History:  Diagnosis Date   Bipolar disorder (HCC)    Diabetes mellitus without complication (HCC)    Sciatica     Past Surgical History:  Procedure Laterality Date   ABDOMINAL HYSTERECTOMY     BREAST BIOPSY Right 09/30/2019   9:00, 5cmfn, Q shape, neg   BREAST BIOPSY Right 09/30/2019   9:00, 9cmfn, vision, neg   EXPLORATORY LAPAROTOMY     SHOULDER SURGERY     TONSILLECTOMY      Family Psychiatric History: Reviewed family psychiatric history from progress note on 07/31/2021.  Family History:  Family History  Problem Relation Age of Onset   Personality disorder Mother    Bipolar disorder Father    Alcohol abuse Brother    Drug abuse Brother    Suicidality Other    Breast cancer Neg Hx     Social History: Reviewed social history from progress note on 07/31/2021. Social History   Socioeconomic History   Marital status: Married    Spouse name: johnnie   Number of children: 2   Years of education: Not on file   Highest education level: Some college, no degree  Occupational History   Not on file  Tobacco Use   Smoking status: Never   Smokeless tobacco: Never  Vaping  Use   Vaping Use: Never used  Substance and Sexual Activity   Alcohol use: Never   Drug use: Never   Sexual activity: Not Currently  Other Topics Concern   Not on file  Social History Narrative   Not on file   Social Determinants of Health   Financial Resource Strain: Not on file  Food Insecurity: Not on file  Transportation Needs: Not on file  Physical Activity: Not on file  Stress: Not on file  Social Connections: Not on file    Allergies:  Allergies  Allergen Reactions   Pseudoephedrine Hcl Other (See Comments)    manic Other reaction(s):  Hallucinations   Cabbage Nausea And Vomiting    Cooked    Ibuprofen Itching    Metabolic Disorder Labs: Lab Results  Component Value Date   HGBA1C 6.2 (H) 03/13/2019   MPG 131.24 03/13/2019   Lab Results  Component Value Date   PROLACTIN 17.2 08/02/2021   No results found for: "CHOL", "TRIG", "HDL", "CHOLHDL", "VLDL", "LDLCALC" Lab Results  Component Value Date   TSH 1.231 08/02/2021   TSH 1.178 03/13/2019    Therapeutic Level Labs: No results found for: "LITHIUM" No results found for: "VALPROATE" No results found for: "CBMZ"  Current Medications: Current Outpatient Medications  Medication Sig Dispense Refill   alendronate (FOSAMAX) 70 MG tablet Take 70 mg by mouth once a week.     atorvastatin (LIPITOR) 10 MG tablet Take 1 tablet (10 mg total) by mouth daily at 6 PM. 30 tablet 1   B-D UF III MINI PEN NEEDLES 31G X 5 MM MISC Inject into the skin.     benztropine (COGENTIN) 0.5 MG tablet Take 1 tablet (0.5 mg total) by mouth daily as needed for tremors. 90 tablet 0   buPROPion (WELLBUTRIN XL) 150 MG 24 hr tablet Take 1 tablet (150 mg total) by mouth daily. 90 tablet 1   fenofibrate micronized (LOFIBRA) 200 MG capsule Take 200 mg by mouth daily.     fluticasone (FLONASE) 50 MCG/ACT nasal spray Place 2 sprays into both nostrils daily.     gabapentin (NEURONTIN) 300 MG capsule Take 300 mg by mouth daily.     insulin glargine (LANTUS) 100 UNIT/ML Solostar Pen Inject 32 Units into the skin at bedtime. 15 mL 6   lamoTRIgine (LAMICTAL) 200 MG tablet Take 1 tablet (200 mg total) by mouth at bedtime. 90 tablet 1   lamoTRIgine (LAMICTAL) 25 MG tablet Take 1-2 tablets (25-50 mg total) by mouth as directed. Take 1 tablet ( 25 mg ) daily morning for 15 days and then increase to 2 tablets ( 50 mg ) after that, take along with 200 mg daily. 45 tablet 0   linaclotide (LINZESS) 145 MCG CAPS capsule Take 145 mcg by mouth daily.     mirtazapine (REMERON) 15 MG tablet Take 1 tablet (15 mg  total) by mouth at bedtime. 90 tablet 1   montelukast (SINGULAIR) 10 MG tablet Take 1 tablet (10 mg total) by mouth at bedtime. 30 tablet 1   pantoprazole (PROTONIX) 40 MG tablet Take 40 mg by mouth daily.     propranolol (INDERAL) 20 MG tablet Take 20 mg by mouth 2 (two) times daily.     QUEtiapine (SEROQUEL) 25 MG tablet Take 1-4 tablets (25-100 mg total) by mouth as directed. Take 1 tablet daily AM and 4 tablets daily at bedtime 450 tablet 1   RYBELSUS 3 MG TABS Take 3 mg by mouth daily.  SUMAtriptan (IMITREX) 50 MG tablet Take by mouth.     traMADol (ULTRAM) 50 MG tablet Take 50 mg by mouth in the morning, at noon, and at bedtime.     traZODone (DESYREL) 100 MG tablet Take 1-2 tablets (100-200 mg total) by mouth at bedtime as needed for sleep. 180 tablet 1   zolpidem (AMBIEN) 5 MG tablet Take 5 mg by mouth at bedtime as needed.     No current facility-administered medications for this visit.     Musculoskeletal: Strength & Muscle Tone: within normal limits Gait & Station:  Slow Patient leans: N/A  Psychiatric Specialty Exam: Review of Systems  Neurological:  Positive for tremors.  Psychiatric/Behavioral:  Positive for dysphoric mood and sleep disturbance.   All other systems reviewed and are negative.   Blood pressure 126/85, pulse 84, temperature 98.3 F (36.8 C), temperature source Oral, height 5' 5.25" (1.657 m), weight 215 lb 3.2 oz (97.6 kg), SpO2 95 %.Body mass index is 35.54 kg/m.  General Appearance: Casual  Eye Contact:  Fair  Speech:  Clear and Coherent  Volume:  Normal  Mood:  Depressed  Affect:  Congruent  Thought Process:  Goal Directed and Descriptions of Associations: Intact  Orientation:  Full (Time, Place, and Person)  Thought Content: Logical   Suicidal Thoughts:  No  Homicidal Thoughts:  No  Memory:  Immediate;   Fair Recent;   Fair Remote;   Fair  Judgement:  Fair  Insight:  Fair  Psychomotor Activity:  Decreased, tremor - BL hands chronic   Concentration:  Concentration: Fair and Attention Span: Fair  Recall:  [employer] of Knowledge: Fair  Language: Fair  Akathisia:  No  Handed:  Right  AIMS (if indicated): done  Assets:  Communication Skills Desire for Improvement Housing Social Support  ADL's:  Intact  Cognition: WNL  Sleep:   excessive   Screenings: AIMS    Flowsheet Row Office Visit from 12/28/2021 in [facility] Office Visit from 10/23/2021 in [facility] Office Visit from 08/29/2021 in [facility] Office Visit from 08/07/2021 in [facility] Office Visit from 07/31/2021 in [facility]  AIMS Total Score 0 0 0 0 0      AUDIT    Flowsheet Row Admission (Discharged) from 03/14/2019 in [facility]  Alcohol Use Disorder Identification Test Final Score (AUDIT) 0      GAD-7    Flowsheet Row Office Visit from 12/28/2021 in [facility] Office Visit from 10/23/2021 in [facility] Office Visit from 07/31/2021 in [facility]  Total GAD-7 Score 0 2 3      PHQ2-9    Flowsheet Row Office Visit from 12/28/2021 in [facility] Office Visit from 10/23/2021 in [facility] Office Visit from 08/29/2021 in [facility] Office Visit from 08/07/2021 in [facility] Office Visit from 07/31/2021 in [facility]  PHQ-2 Total Score 2 0 0 0 0  PHQ-9 Total Score 8 -- -- -- --      Flowsheet Row Office Visit from 12/28/2021 in [facility] Office Visit from 10/23/2021 in [facility] Office Visit from 08/29/2021 in [facility]  C-SSRS RISK CATEGORY No Risk No Risk No Risk         Assessment and Plan: YASHA TIBBETT is a 66 year old Caucasian female who has a  history of bipolar disorder, borderline personality disorder, autism spectrum disorder, diabetes mellitus, sciatica, dizziness, tremors, presented for medication management.  Patient with worsening mood symptoms, will benefit from the following plan.  Plan Bipolar disorder type I depressed moderate-unstable Continue Seroquel 25 mg daily in the morning and 100 mg at bedtime Mirtazapine 15 mg p.o. nightly Trazodone 100-200 mg p.o. nightly as needed Lamotrigine 200 mg p.o. daily at bedtime, and lamotrigine 25 mg p.o. daily for 15 days and increase to 50 mg p.o. daily after that. Wellbutrin XL 150 mg p.o. daily Discussed with patient to taper off zolpidem, patient on polypharmacy.  Provided instructions to do so.  Borderline personality disorder-stable Will monitor closely  Tremor-likely drug induced due to psychotropics-unstable Will start Cogentin 0.5 mg daily as needed for severe tremors only.  Advised to limit use.  Provided medication education.  Autism spectrum disorder-chronic-stable Patient may benefit from psychotherapy however patient is not interested    Collateral info history obtained from spouse as noted above.  Crisis plan discussed with patient, patient to go to the nearest emergency department or call [phone] if in a crisis.  Follow-up in clinic in 4 weeks or sooner if needed.  This note was generated in part or whole with voice recognition software. Voice recognition is usually quite accurate but there are transcription errors that can and very often do occur. I apologize for any typographical errors that were not detected and corrected.      Jomarie Longs, MD 12/28/2021, 12:49 PM

## 2021-12-31 ENCOUNTER — Telehealth: Payer: Self-pay

## 2021-12-31 NOTE — Telephone Encounter (Signed)
received fax requesting a refill on the zolpidem 5mg   Pt last seen on11-10-23 next appt  02-14-22

## 2021-12-31 NOTE — Telephone Encounter (Signed)
Please see my phone note from 12/28/2021-this past Friday-below.   This medication was prescribed by primary care. Patient advised to taper off. Will not send refills at this time. Patient aware.

## 2022-01-01 NOTE — Telephone Encounter (Signed)
  received another fax that they need refill on the zolpidem. pt last seen on 12-28-21 next appt 02-14-22     Disp Refills Start End   zolpidem (AMBIEN) 5 MG tablet 30 tablet 0 12/28/2021    Sig - Route: Take 1 tablet (5 mg total) by mouth at bedtime as needed. Please wean off - Oral   Class: No Print

## 2022-01-01 NOTE — Telephone Encounter (Signed)
Not appropriate for refill as already noted when this message was sent previously.

## 2022-01-02 ENCOUNTER — Ambulatory Visit: Payer: Medicare PPO

## 2022-01-03 NOTE — Telephone Encounter (Signed)
Pharmacy notified to send to provider.

## 2022-01-07 ENCOUNTER — Telehealth: Payer: Self-pay

## 2022-01-07 NOTE — Telephone Encounter (Signed)
Not appropriate to fill

## 2022-01-07 NOTE — Telephone Encounter (Signed)
received fax request for a refill on the zolipem 5mg . last appt 12-28-21 next appt 02-14-22  [facility]

## 2022-01-07 NOTE — Telephone Encounter (Signed)
Not appropriate for refill 

## 2022-01-07 NOTE — Telephone Encounter (Signed)
    received notice that patient needed refills that they don't have one on file.   Disp Refills Start End   zolpidem (AMBIEN) 5 MG tablet 30 tablet 0 12/28/2021    Sig - Route: Take 1 tablet (5 mg total) by mouth at bedtime as needed. Please wean off - Oral   Class: No Print

## 2022-01-09 ENCOUNTER — Ambulatory Visit: Payer: Medicare PPO

## 2022-01-21 ENCOUNTER — Other Ambulatory Visit: Payer: Self-pay | Admitting: Psychiatry

## 2022-01-21 DIAGNOSIS — F3132 Bipolar disorder, current episode depressed, moderate: Secondary | ICD-10-CM

## 2022-01-30 ENCOUNTER — Ambulatory Visit: Payer: Medicare PPO | Attending: Neurology

## 2022-02-14 ENCOUNTER — Encounter: Payer: Self-pay | Admitting: Psychiatry

## 2022-02-14 ENCOUNTER — Ambulatory Visit (INDEPENDENT_AMBULATORY_CARE_PROVIDER_SITE_OTHER): Payer: Medicare PPO | Admitting: Psychiatry

## 2022-02-14 VITALS — BP 132/82 | HR 86 | Temp 98.5°F | Ht 65.25 in | Wt 213.8 lb

## 2022-02-14 DIAGNOSIS — Z8659 Personal history of other mental and behavioral disorders: Secondary | ICD-10-CM

## 2022-02-14 DIAGNOSIS — F3132 Bipolar disorder, current episode depressed, moderate: Secondary | ICD-10-CM | POA: Diagnosis not present

## 2022-02-14 DIAGNOSIS — R251 Tremor, unspecified: Secondary | ICD-10-CM | POA: Diagnosis not present

## 2022-02-14 DIAGNOSIS — F840 Autistic disorder: Secondary | ICD-10-CM | POA: Diagnosis not present

## 2022-02-14 DIAGNOSIS — F603 Borderline personality disorder: Secondary | ICD-10-CM

## 2022-02-14 NOTE — Progress Notes (Signed)
BH MD OP Progress Note  02/14/2022 11:54 AM Marilyn Davis  MRN:  [id number]  Chief Complaint:  Chief Complaint  Patient presents with   Follow-up   Anxiety   Depression   Medication Refill   HPI: Marilyn Davis is a 66 year old Caucasian female who has a history of bipolar disorder, borderline personality disorder, autism spectrum, multiple medical problems including diabetes mellitus, sciatica, dizziness, tremors was evaluated in office today.  Patient today reports she is currently improving on the higher dosage of Lamictal although she continues to have low motivation, sadness.  Patient reports she gets more depressed around the holidays every year.  She also does not like the fact that the days are getting shorter and darker around this time.  She does have light therapy available at home which she has been using.  She reports she is tolerating the Lamictal well.  Patient reports sleep is overall good.  Currently not on the Ambien.  The trazodone does help.  Does have difficulty falling asleep however when she is able to fall asleep she is able to stay asleep.  Pain is currently managed well.  She is currently working on sleep hygiene.  Patient denies any suicidality, homicidality or perceptual disturbances.  Patient reports she is motivated to start psychotherapy, will make the referral.  Patient reports she does participate in social activities in her community, Mondays they have bingo groups and Thursdays craft groups.  She is interested in starting exercise however will have to wait until January to get her new insurance to do that.  Reports husband is supportive.  Denies any other concerns today.  Visit Diagnosis:    ICD-10-CM   1. Bipolar 1 disorder, depressed, moderate (HCC)  F31.32     2. Borderline personality disorder (HCC)  F60.3     3. Tremor  R25.1    Likely due to psychotropics    4. Autism spectrum disorder  F84.0     5. History of posttraumatic  stress disorder (PTSD)  Z86.59       Past Psychiatric History: Reviewed past psychiatric history from progress note on 07/31/2021.  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 Past Medical History:  Diagnosis Date   Bipolar disorder (HCC)    Diabetes mellitus without complication (HCC)    Sciatica     Past Surgical History:  Procedure Laterality Date   ABDOMINAL HYSTERECTOMY     BREAST BIOPSY Right 09/30/2019   9:00, 5cmfn, Q shape, neg   BREAST BIOPSY Right 09/30/2019   9:00, 9cmfn, vision, neg   EXPLORATORY LAPAROTOMY     SHOULDER SURGERY     TONSILLECTOMY      Family Psychiatric History: Reviewed family psychiatric history from progress note on 07/31/2021.  Family History:  Family History  Problem Relation Age of Onset   Personality disorder Mother    Bipolar disorder Father    Alcohol abuse Brother    Drug abuse Brother    Suicidality Other    Breast cancer Neg Hx     Social History: Reviewed social history from progress note on 07/31/2021. Social History   Socioeconomic History   Marital status: Married    Spouse name: Marilyn Davis   Number of children: 2   Years of education: Not on file   Highest education level: Some college, no degree  Occupational History   Not on file  Tobacco Use   Smoking status: Never   Smokeless tobacco: Never  Vaping Use  Vaping Use: Never used  Substance and Sexual Activity   Alcohol use: Never   Drug use: Never   Sexual activity: Not Currently  Other Topics Concern   Not on file  Social History Narrative   Not on file   Social Determinants of Health   Financial Resource Strain: Not on file  Food Insecurity: Not on file  Transportation Needs: Not on file  Physical Activity: Not on file  Stress: Not on file  Social Connections: Not on file    Allergies:  Allergies  Allergen Reactions   Pseudoephedrine Hcl Other (See Comments)    manic Other reaction(s): Hallucinations    Cabbage Nausea And Vomiting    Cooked    Ibuprofen Itching    Metabolic Disorder Labs: Lab Results  Component Value Date   HGBA1C 6.2 (H) 03/13/2019   MPG 131.24 03/13/2019   Lab Results  Component Value Date   PROLACTIN 17.2 08/02/2021   No results found for: "CHOL", "TRIG", "HDL", "CHOLHDL", "VLDL", "LDLCALC" Lab Results  Component Value Date   TSH 1.231 08/02/2021   TSH 1.178 03/13/2019    Therapeutic Level Labs: No results found for: "LITHIUM" No results found for: "VALPROATE" No results found for: "CBMZ"  Current Medications: Current Outpatient Medications  Medication Sig Dispense Refill   alendronate (FOSAMAX) 70 MG tablet Take 70 mg by mouth once a week.     atorvastatin (LIPITOR) 10 MG tablet Take 1 tablet (10 mg total) by mouth daily at 6 PM. 30 tablet 1   B-D UF III MINI PEN NEEDLES 31G X 5 MM MISC Inject into the skin.     benztropine (COGENTIN) 0.5 MG tablet Take 1 tablet (0.5 mg total) by mouth daily as needed for tremors. 90 tablet 0   buPROPion (WELLBUTRIN XL) 150 MG 24 hr tablet Take 1 tablet (150 mg total) by mouth daily. 90 tablet 1   fenofibrate micronized (LOFIBRA) 200 MG capsule Take 200 mg by mouth daily.     fluticasone (FLONASE) 50 MCG/ACT nasal spray Place 2 sprays into both nostrils daily.     gabapentin (NEURONTIN) 300 MG capsule Take 300 mg by mouth daily.     insulin glargine (LANTUS) 100 UNIT/ML Solostar Pen Inject 32 Units into the skin at bedtime. 15 mL 6   lamoTRIgine (LAMICTAL) 200 MG tablet Take 1 tablet (200 mg total) by mouth at bedtime. 90 tablet 1   lamoTRIgine (LAMICTAL) 25 MG tablet Take 1 tablet (25 mg total) by mouth 2 (two) times daily. Take along with 200 mg daily 60 tablet 1   linaclotide (LINZESS) 145 MCG CAPS capsule Take 145 mcg by mouth daily.     mirtazapine (REMERON) 15 MG tablet Take 1 tablet (15 mg total) by mouth at bedtime. 90 tablet 1   montelukast (SINGULAIR) 10 MG tablet Take 1 tablet (10 mg total) by mouth at  bedtime. 30 tablet 1   pantoprazole (PROTONIX) 40 MG tablet Take 40 mg by mouth daily.     propranolol (INDERAL) 20 MG tablet Take 20 mg by mouth 2 (two) times daily.     QUEtiapine (SEROQUEL) 25 MG tablet Take 1-4 tablets (25-100 mg total) by mouth as directed. Take 1 tablet daily AM and 4 tablets daily at bedtime 450 tablet 1   RYBELSUS 3 MG TABS Take 3 mg by mouth daily.     SUMAtriptan (IMITREX) 50 MG tablet Take by mouth.     traMADol (ULTRAM) 50 MG tablet Take 50 mg by mouth in  the morning, at noon, and at bedtime.     traZODone (DESYREL) 100 MG tablet Take 1-2 tablets (100-200 mg total) by mouth at bedtime as needed for sleep. 180 tablet 1   No current facility-administered medications for this visit.     Musculoskeletal: Strength & Muscle Tone: within normal limits Gait & Station: normal Patient leans: N/A  Psychiatric Specialty Exam: Review of Systems  Neurological:  Positive for tremors (Chronic).  Psychiatric/Behavioral:  Positive for dysphoric mood.   All other systems reviewed and are negative.   Blood pressure 132/82, pulse 86, temperature 98.5 F (36.9 C), temperature source Oral, height 5' 5.25" (1.657 m), weight 213 lb 12.8 oz (97 kg).Body mass index is 35.31 kg/m.  General Appearance: Casual  Eye Contact:  Fair  Speech:  Clear and Coherent  Volume:  Normal  Mood:  Depressed improving  Affect:  Congruent  Thought Process:  Goal Directed and Descriptions of Associations: Intact  Orientation:  Full (Time, Place, and Person)  Thought Content: Logical   Suicidal Thoughts:  No  Homicidal Thoughts:  No  Memory:  Immediate;   Fair Recent;   Fair Remote;   Fair  Judgement:  Fair  Insight:  Fair  Psychomotor Activity:  Tremor  Concentration:  Concentration: Fair and Attention Span: Fair  Recall:  F[employer]Fair  Fund of Knowledge: Fair  Language: Fair  Akathisia:  No  Handed:  Ambidextrous  AIMS (if indicated): done  Assets:  Communication Skills Desire for  Improvement Resilience Social Support Talents/Skills  ADL's:  Intact  Cognition: WNL  Sleep:  Fair   Screenings: AIMS    Flowsheet Row [facility] Visit [facility]14[facility]20[facility] Office Visit from [facility]/28[facility]2021 [facility] Office Visit [facility] Office Visit from [facility]29/2021 in [facility] Office Visit from [facility]07/2021 in [facility]  AIMS Total Score 0 0 0 0 0      AUDIT    Flowsheet Row [facility]  Alcohol Use Disorder Identification Test Final Score (AUDIT) 0      GAD-7    Flowsheet Row Office Visit from 02/14/2022 [facility] Visit from [facility] from [facility] Office Visit from [facility]31/2021 [facility]  Total GAD-7 Score 3 0 2 3      PHQ2-9    Flowsheet Row [facility]14/20[facility] Office Visit from [facility] Office Visit from 10[facility]23/2021 in [facility] Office Visit from [facility] Office Visit from [facility]07/20[facility]  PHQ-2 Total Score 3 2 0 0 0  PHQ-9 Total Score 9 8 -- -- --      Flowsheet Row [facility] Office Visit from [facility]28[facility]2021 in [facility] Office Visit from 10[facility]23/2021 in [facility]  C-SSRS RISK CATEGORY No Risk No Risk No Risk        Assessment and Plan: Lyanne CoBreandan A Ciani is a 66 year old Caucasian female who has a history of bipolar disorder, borderline  personality disorder, autism spectrum disorder, diabetes mellitus, multiple other medical problems was evaluated in office today.  Patient with good response to Lamictal higher dosage, will refer for psychotherapy.  Plan as noted below.  Plan Bipolar disorder type I depressed moderate in partial remission Seroquel 25 mg in the morning and 100 mg at bedtime Mirtazapine 15 mg p.o. nightly Trazodone 100-200 mg p.o. nightly as needed Lamictal 200 mg p.o. daily at bedtime and 50 mg p.o. daily during the day. Wellbutrin XL 150 mg p.o. daily Will refer for CBT.  Borderline personality disorder-stable Will monitor closely  Tremor likely drug-induced psychotropics-improving Cogentin 0.5 mg as needed for severe tremors.  Autism spectrum disorder-chronic-stable Patient referred for CBT Communicated with staff to schedule this patient with our new therapist.  Follow-up in clinic in 8 weeks or sooner if needed.   This note was generated in part or whole with voice recognition software. Voice recognition is usually quite accurate but there are transcription errors that can and very often do occur. I apologize for any typographical errors that were not detected and corrected.     Jomarie Longs, MD 02/15/2022, 8:34 AM

## 2022-02-26 ENCOUNTER — Telehealth: Payer: Self-pay

## 2022-02-26 DIAGNOSIS — F3174 Bipolar disorder, in full remission, most recent episode manic: Secondary | ICD-10-CM

## 2022-02-26 DIAGNOSIS — F603 Borderline personality disorder: Secondary | ICD-10-CM

## 2022-02-26 DIAGNOSIS — F3132 Bipolar disorder, current episode depressed, moderate: Secondary | ICD-10-CM

## 2022-02-26 MED ORDER — LAMOTRIGINE 25 MG PO TABS: 25.0000 mg | ORAL_TABLET | Freq: Two times a day (BID) | ORAL | 0 refills | Status: DC

## 2022-02-26 NOTE — Telephone Encounter (Signed)
Contacted patient, patient unable to give any details about her medication or pharmacy change.  Per spouse patient's insurance changed and hence all medications needs to go to [facility].  However per review of medical records patient had refill for all her psychotropic medications beginning of December 2023.  She has received a 90-day supply and is too soon to fill any of these medications.

## 2022-02-26 NOTE — Telephone Encounter (Signed)
please send medication to [facility]

## 2022-02-26 NOTE — Telephone Encounter (Signed)
I have send Lamictal 25 mg twice a day-90 days supply to the new pharmacy-[facility].

## 2022-02-26 NOTE — Telephone Encounter (Signed)
called [facility]. the that would need a refill is the lamictal 25mg . only a 30 day supply was filled on 02-13-22

## 2022-02-27 NOTE — Telephone Encounter (Signed)
let pt husband know that rx was sent to the [facility].

## 2022-02-28 NOTE — Telephone Encounter (Signed)
notified patient husband rx sent.

## 2022-04-01 ENCOUNTER — Telehealth: Payer: Self-pay | Admitting: Psychiatry

## 2022-04-01 NOTE — Telephone Encounter (Signed)
LamoTRIgine (LAMICTAL) 25 MG tablet 180 tablet 0 02/26/2022    Sig - Route: Take 1 tablet (25 mg total) by mouth 2 (two) times daily. Take along with 200 mg daily, total of 250 mg daily - Oral   Sent to pharmacy as: lamoTRIgine (LAMICTAL) 25 MG tablet   E-Prescribing Status: Receipt confirmed by pharmacy (02/26/2022  4:56 PM ES     This medication is what we sent to [facility] per patient request in January.  Patient was not due for any of the other medications and will only be due beginning of March.  Will have Jessica CMA contact [facility] to confirm.    Routing this message to [place] CMA.

## 2022-04-01 NOTE — Telephone Encounter (Signed)
Patient husband called stating [facility] never received request for medication. He states the number for them is [phone] and fax is [phone]. They have not received any of her medications from our office. Please advise and call him to let them know it was sent

## 2022-04-03 ENCOUNTER — Other Ambulatory Visit: Payer: Self-pay | Admitting: Family

## 2022-04-03 DIAGNOSIS — F3174 Bipolar disorder, in full remission, most recent episode manic: Secondary | ICD-10-CM

## 2022-04-03 DIAGNOSIS — F603 Borderline personality disorder: Secondary | ICD-10-CM

## 2022-04-03 NOTE — Telephone Encounter (Signed)
Pt husband notified 

## 2022-04-03 NOTE — Telephone Encounter (Signed)
called pharamacy and they will be getting rx ready. after it has processed it will take 1-2 business day to receive.

## 2022-04-19 ENCOUNTER — Ambulatory Visit (INDEPENDENT_AMBULATORY_CARE_PROVIDER_SITE_OTHER): Payer: Medicare HMO | Admitting: Psychiatry

## 2022-04-19 ENCOUNTER — Encounter: Payer: Self-pay | Admitting: Psychiatry

## 2022-04-19 VITALS — BP 120/77 | HR 70 | Temp 97.5°F | Ht 65.25 in | Wt 211.8 lb

## 2022-04-19 DIAGNOSIS — F3176 Bipolar disorder, in full remission, most recent episode depressed: Secondary | ICD-10-CM | POA: Diagnosis not present

## 2022-04-19 DIAGNOSIS — R251 Tremor, unspecified: Secondary | ICD-10-CM

## 2022-04-19 DIAGNOSIS — F840 Autistic disorder: Secondary | ICD-10-CM

## 2022-04-19 DIAGNOSIS — F603 Borderline personality disorder: Secondary | ICD-10-CM | POA: Diagnosis not present

## 2022-04-19 DIAGNOSIS — Z8659 Personal history of other mental and behavioral disorders: Secondary | ICD-10-CM

## 2022-04-19 MED ORDER — BENZTROPINE MESYLATE 0.5 MG PO TABS: 0.5000 mg | ORAL_TABLET | Freq: Every day | ORAL | 1 refills | Status: DC | PRN

## 2022-04-19 NOTE — Progress Notes (Signed)
[facility] MD OP Progress Note  04/19/2022 10:58 AM Marilyn Davis  MRN:  [id number]  Chief Complaint:  Chief Complaint  Patient presents with   Follow-up   Manic Behavior   Depression   Medication Refill   HPI: Marilyn Davis is a 67 year old Caucasian female who has a history of bipolar disorder, borderline personality disorder, autism spectrum, multiple medical problems including diabetes mellitus, sciatica, dizziness, tremors was evaluated in the office today.  Patient today appeared to be alert, oriented to person and place time and situation.  3 word memory immediate and 3 out of 3, after 5 minutes 2 out of 3.  Patient unable to do calculation.  She was able to do digits forward and backward, 2 digits and 3 digits however unable to do 4 digits backward.  Patient reports overall mood symptoms as stable.  Denies any significant depression symptoms.  Reports anxiety symptoms are manageable.  Patient does report reduced appetite however reports she has been eating enough for herself.  Reports she may have had a higher appetite previously.  She has lost a few pounds in the past 3 months or so.  Patient denies any suicidality, homicidality or perceptual disturbances.  Patient reports she is compliant on medications.  Denies side effects.  Patient denies any other concerns today.  Visit Diagnosis:    ICD-10-CM   1. Bipolar disorder, in full remission, most recent episode depressed ([place])  F31.76     2. Borderline personality disorder ([place])  F60.3     3. Tremor  R25.1 benztropine (COGENTIN) 0.5 MG tablet   likely drug induced , psychotropics    4. Autism spectrum disorder  F84.0     5. History of posttraumatic stress disorder (PTSD)  Z86.59       Past Psychiatric History: Reviewed past psychiatric history from progress note on 07/31/2021.  Past Medical History:  Past Medical History:  Diagnosis Date   Bipolar disorder ([place])    Diabetes mellitus without complication ([place])     Sciatica     Past Surgical History:  Procedure Laterality Date   ABDOMINAL HYSTERECTOMY     BREAST BIOPSY Right 09/30/2019   9:00, 5cmfn, Q shape, neg   BREAST BIOPSY Right 09/30/2019   9:00, 9cmfn, vision, neg   EXPLORATORY LAPAROTOMY     SHOULDER SURGERY     TONSILLECTOMY      Family Psychiatric History: Reviewed family psychiatric history from progress note on 07/31/2021.  Family History:  Family History  Problem Relation Age of Onset   Personality disorder Mother    Bipolar disorder Father    Alcohol abuse Brother    Drug abuse Brother    Suicidality Other    Breast cancer Neg Hx     Social History: Reviewed social history from progress note on 07/31/2021. Social History   Socioeconomic History   Marital status: Married    Spouse name: johnnie   Number of children: 2   Years of education: Not on file   Highest education level: Some college, no degree  Occupational History   Not on file  Tobacco Use   Smoking status: Never   Smokeless tobacco: Never  Vaping Use   Vaping Use: Never used  Substance and Sexual Activity   Alcohol use: Never   Drug use: Never   Sexual activity: Not Currently  Other Topics Concern   Not on file  Social History Narrative   Not on file   Social Determinants of Health   Financial  Resource Strain: Not on file  Food Insecurity: Not on file  Transportation Needs: Not on file  Physical Activity: Not on file  Stress: Not on file  Social Connections: Not on file    Allergies:  Allergies  Allergen Reactions   Pseudoephedrine Hcl Other (See Comments)    manic Other reaction(s): Hallucinations   Cabbage Nausea And Vomiting    Cooked    Ibuprofen Itching    Metabolic Disorder Labs: Lab Results  Component Value Date   HGBA1C 6.2 (H) 03/13/2019   MPG 131.24 03/13/2019   Lab Results  Component Value Date   PROLACTIN 17.2 08/02/2021   No results found for: "CHOL", "TRIG", "HDL", "CHOLHDL", "VLDL", "LDLCALC" Lab  Results  Component Value Date   TSH 1.231 08/02/2021   TSH 1.178 03/13/2019    Therapeutic Level Labs: No results found for: "LITHIUM" No results found for: "VALPROATE" No results found for: "CBMZ"  Current Medications: Current Outpatient Medications  Medication Sig Dispense Refill   alendronate (FOSAMAX) 70 MG tablet Take 70 mg by mouth once a week.     atorvastatin (LIPITOR) 10 MG tablet Take 1 tablet (10 mg total) by mouth daily at 6 PM. 30 tablet 1   B-D UF III MINI PEN NEEDLES 31G X 5 MM MISC Inject into the skin.     buPROPion (WELLBUTRIN XL) 150 MG 24 hr tablet Take 1 tablet (150 mg total) by mouth daily. 90 tablet 1   fenofibrate micronized (LOFIBRA) 200 MG capsule Take 200 mg by mouth daily.     fluticasone (FLONASE) 50 MCG/ACT nasal spray Place 2 sprays into both nostrils daily.     gabapentin (NEURONTIN) 300 MG capsule Take 300 mg by mouth daily.     insulin glargine (LANTUS) 100 UNIT/ML Solostar Pen Inject 32 Units into the skin at bedtime. 15 mL 6   lamoTRIgine (LAMICTAL) 200 MG tablet Take 1 tablet (200 mg total) by mouth at bedtime. 90 tablet 1   lamoTRIgine (LAMICTAL) 25 MG tablet Take 1 tablet (25 mg total) by mouth 2 (two) times daily. Take along with 200 mg daily, total of 250 mg daily 180 tablet 0   linaclotide (LINZESS) 145 MCG CAPS capsule Take 145 mcg by mouth daily.     mirtazapine (REMERON) 15 MG tablet Take 1 tablet (15 mg total) by mouth at bedtime. 90 tablet 1   montelukast (SINGULAIR) 10 MG tablet Take 1 tablet (10 mg total) by mouth at bedtime. 30 tablet 1   pantoprazole (PROTONIX) 40 MG tablet Take 40 mg by mouth daily.     propranolol (INDERAL) 20 MG tablet Take 20 mg by mouth 2 (two) times daily.     QUEtiapine (SEROQUEL) 25 MG tablet TAKE 1 TABLET BY MOUTH 3 TIMES A DAY 270 tablet 3   RYBELSUS 3 MG TABS Take 3 mg by mouth daily.     SUMAtriptan (IMITREX) 50 MG tablet Take by mouth.     traMADol (ULTRAM) 50 MG tablet Take 50 mg by mouth in the morning,  at noon, and at bedtime.     traZODone (DESYREL) 100 MG tablet Take 1-2 tablets (100-200 mg total) by mouth at bedtime as needed for sleep. 180 tablet 1   benztropine (COGENTIN) 0.5 MG tablet Take 1 tablet (0.5 mg total) by mouth daily as needed for tremors. 90 tablet 1   No current facility-administered medications for this visit.     Musculoskeletal: Strength & Muscle Tone: within normal limits Gait & Station: normal Patient  leans: N/A  Psychiatric Specialty Exam: Review of Systems  Constitutional:  Positive for appetite change.  Psychiatric/Behavioral: Negative.    All other systems reviewed and are negative.   Blood pressure 120/77, pulse 70, temperature (!) 97.5 F (36.4 C), temperature source Skin, height 5' 5.25" (1.657 m), weight 211 lb 12.8 oz (96.1 kg).Body mass index is 34.98 kg/m.  General Appearance: Casual  Eye Contact:  Fair  Speech:  Clear and Coherent  Volume:  Normal  Mood:  Euthymic  Affect:  Congruent  Thought Process:  Goal Directed and Descriptions of Associations: Intact  Orientation:  Full (Time, Place, and Person)  Thought Content: Logical   Suicidal Thoughts:  No  Homicidal Thoughts:  No  Memory:  Immediate;   Fair Recent;   Fair Remote;   Fair  Judgement:  Fair  Insight:  Fair  Psychomotor Activity:  Normal  Concentration:  Concentration: Fair and Attention Span: Fair  Recall:  [employer] of Knowledge: Fair  Language: Fair  Akathisia:  No  Handed:  Ambidextrous  AIMS (if indicated): done  Assets:  Communication Skills Desire for Improvement Housing Intimacy Social Support  ADL's:  Intact  Cognition: WNL  Sleep:  Fair   Screenings: [place] Visit from 04/19/2022 in [facility] Office Visit from 02/14/2022 in [facility] Office Visit from 12/28/2021 in [facility] Office Visit from 10/23/2021 in  [facility] Office Visit from 08/29/2021 in [facility] Total Score 0 0 0 0 0      AUDIT    Flowsheet Row Admission (Discharged) from 03/14/2019 in [facility]  Alcohol Use Disorder Identification Test Final Score (AUDIT) 0      GAD-7    Flowsheet Row Office Visit from 04/19/2022 in [facility] Office Visit from 02/14/2022 in [facility] Office Visit from 12/28/2021 in [facility] Office Visit from 10/23/2021 in [facility] Office Visit from 07/31/2021 in [facility]  Total GAD-7 Score 1 3 0 2 3      PHQ2-9    [place] Visit from 04/19/2022 in [facility] Office Visit from 02/14/2022 in [facility] Office Visit from 12/28/2021 in [facility] Office Visit from 10/23/2021 in [facility] Office Visit from 08/29/2021 in [facility]  PHQ-2 Total Score 0 3 2 0 0  PHQ-9 Total Score '5 9 8 '$ -- --      [place] Office Visit from 04/19/2022 in [facility] Office Visit from 02/14/2022 in [facility] Office Visit from 12/28/2021 in [facility] CATEGORY Low Risk No Risk No Risk        Assessment and Plan: Marilyn Davis is a 67 year old Caucasian female who has a history of bipolar disorder, borderline personality disorder, autism spectrum disorder was evaluated in the office today.  Patient is currently stable.  Plan as noted  below.  Plan Bipolar disorder type I depressed moderate in remission Seroquel 25 mg p.o. daily in the morning and 100 mg p.o. daily at bedtime Mirtazapine 15 mg p.o. nightly Trazodone  100-200 mg p.o. nightly as needed Lamictal 200 mg p.o. daily at bedtime, 50 mg p.o. daily during the day. Wellbutrin XL 150 mg p.o. daily Patient was referred for CBT-pending  Borderline personality disorder-stable Patient has been referred for CBT.  Tremors likely drug induced psychotropics-improving Cogentin 0.5 mg as needed for severe tremors. AIMS - 0  Autism spectrum disorder-chronic-stable Referred for CBT  Patient advised to follow up with primary care provider if she continues to have weight loss, unexpected.  She currently reports although her appetite is low she has been eating enough for herself.  Follow-up in clinic in 2 to 3 months or sooner if needed.  Collaboration of Care: Collaboration of Care: Referral or follow-up with counselor/therapist AEB communicated with staff regarding scheduling this patient with a new therapist.  Patient/Guardian was advised Release of Information must be obtained prior to any record release in order to collaborate their care with an outside provider. Patient/Guardian was advised if they have not already done so to contact the registration department to sign all necessary forms in order for [place] to release information regarding their care.   Consent: Patient/Guardian gives verbal consent for treatment and assignment of benefits for services provided during this visit. Patient/Guardian expressed understanding and agreed to proceed.   This note was generated in part or whole with voice recognition software. Voice recognition is usually quite accurate but there are transcription errors that can and very often do occur. I apologize for any typographical errors that were not detected and corrected.      Ursula Alert, MD 04/19/2022, 10:58 AM

## 2022-04-24 ENCOUNTER — Telehealth: Payer: Self-pay

## 2022-04-24 DIAGNOSIS — R251 Tremor, unspecified: Secondary | ICD-10-CM

## 2022-04-24 NOTE — Telephone Encounter (Signed)
prior auth was needed for the benztropine .'5mg'$ .

## 2022-04-25 MED ORDER — BENZTROPINE MESYLATE 0.5 MG PO TABS: 0.5000 mg | ORAL_TABLET | Freq: Every day | ORAL | 1 refills | Status: DC | PRN

## 2022-04-25 NOTE — Telephone Encounter (Signed)
Prior auth was denied

## 2022-04-25 NOTE — Telephone Encounter (Signed)
Contacted patient to discuss, they would [facility].  I have sent a prescription.  Patient to check with pharmacy and let writer know.

## 2022-05-04 IMAGING — US US BREAST*L* LIMITED INC AXILLA
1 series · 2 of 2 positions shown · non-contrast
Comparison: Previous exam(s).

CLINICAL DATA: Screening recall for a possible right breast mass
and a left breast asymmetry. At the end of the exam, the patient
noted that she has had yellow to clear multi duct chronic nipple
discharge from the right breast only for about 10 years.

EXAM:
DIGITAL DIAGNOSTIC BILATERAL MAMMOGRAM WITH TOMO AND CAD; ULTRASOUND
RIGHT BREAST LIMITED; ULTRASOUND LEFT BREAST LIMITED

[Series 1: us breast*left* limited inc axilla · 0.06mm/px · 2 of 2 slices shown]
[im 1/2]
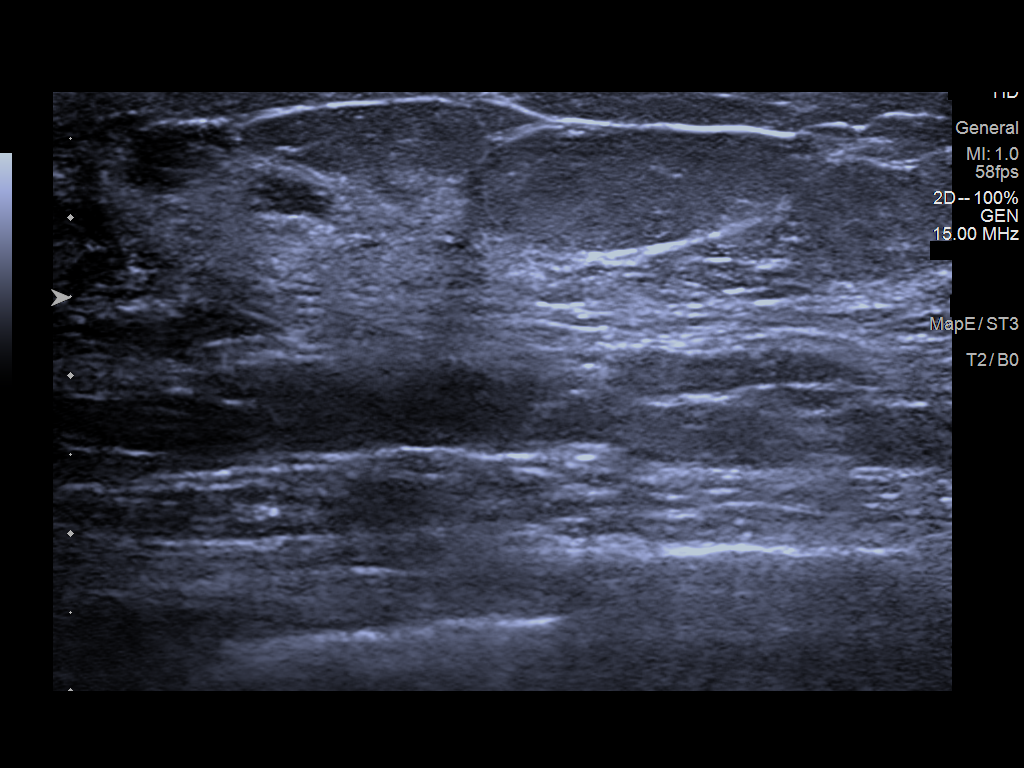
[im 2/2]
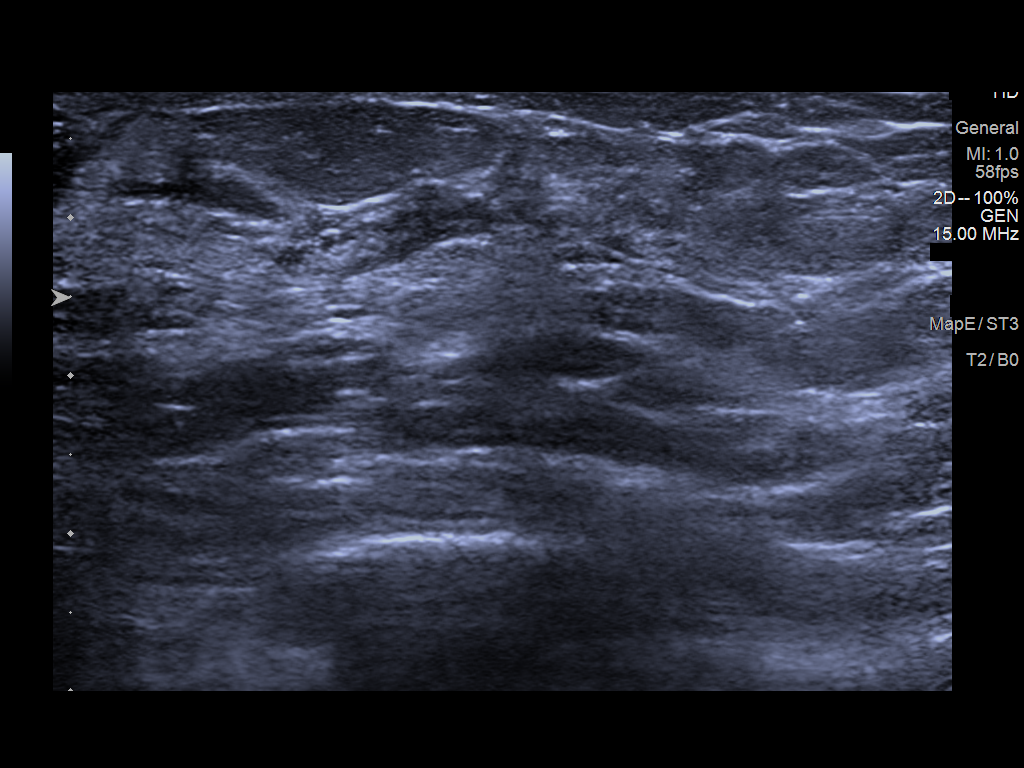

[2 of 2 positions shown; findings below may reference images not displayed]

ACR Breast Density Category c: The breast tissue is heterogeneously
dense, which may obscure small masses.
FINDINGS: No definite persistent masses are seen on the spot compression
tomosynthesis images through the lateral posterior right breast.
There is a possible obscured mass in the retroareolar left breast on
the spot compression tomosynthesis images.

Mammographic images were processed with CAD.

No suspicious masses are seen in the retroareolar left breast on
ultrasound. Scattered fibrocystic changes are noted.

Ultrasound of the lateral right breast demonstrates multiple
scattered benign-appearing cysts. She does have a few prominent
dilated ducts at the 9-930 o'clock position.

At 9 o'clock, 9 cm from the nipple there is a complex mass which is
predominantly solid but does demonstrate a small cystic space. The
mass measures 0.6 x 0.3 x 0.6 cm.

Ultrasound of the right breast at 9 o'clock, 5 cm from the nipple
demonstrates a second similar-appearing complex mass measuring
approximately 1.2 x 0.4 x 1.1 cm.

Ultrasound of the right axilla demonstrates multiple
normal-appearing lymph nodes.
IMPRESSION: 1. There is an indeterminate 0.6 cm mass in the right breast at 9
o'clock, 9 cm from the nipple.

2. There is an indeterminate 1.2 cm mass in the right breast at 9
o'clock, 5 cm from the nipple.

3.  No evidence of right axillary lymphadenopathy.

4. No persistent mammographic or targeted sonographic findings are
seen in the retroareolar left breast.

RECOMMENDATION:
1. Ultrasound-guided biopsy recommended for the 2 right breast
masses at 9 o'clock.

I have discussed the findings and recommendations with the patient.
If applicable, a reminder letter will be sent to the patient
regarding the next appointment.

BI-RADS CATEGORY  4: Suspicious.

## 2022-05-04 IMAGING — MG DIGITAL DIAGNOSTIC BILAT W/ TOMO W/ CAD
6 of 10 series · 6 of 30 positions shown · non-contrast
Comparison: Previous exam(s).

CLINICAL DATA: Screening recall for a possible right breast mass
and a left breast asymmetry. At the end of the exam, the patient
noted that she has had yellow to clear multi duct chronic nipple
discharge from the right breast only for about 10 years.

EXAM:
DIGITAL DIAGNOSTIC BILATERAL MAMMOGRAM WITH TOMO AND CAD; ULTRASOUND
RIGHT BREAST LIMITED; ULTRASOUND LEFT BREAST LIMITED

[L CC synth-2D]
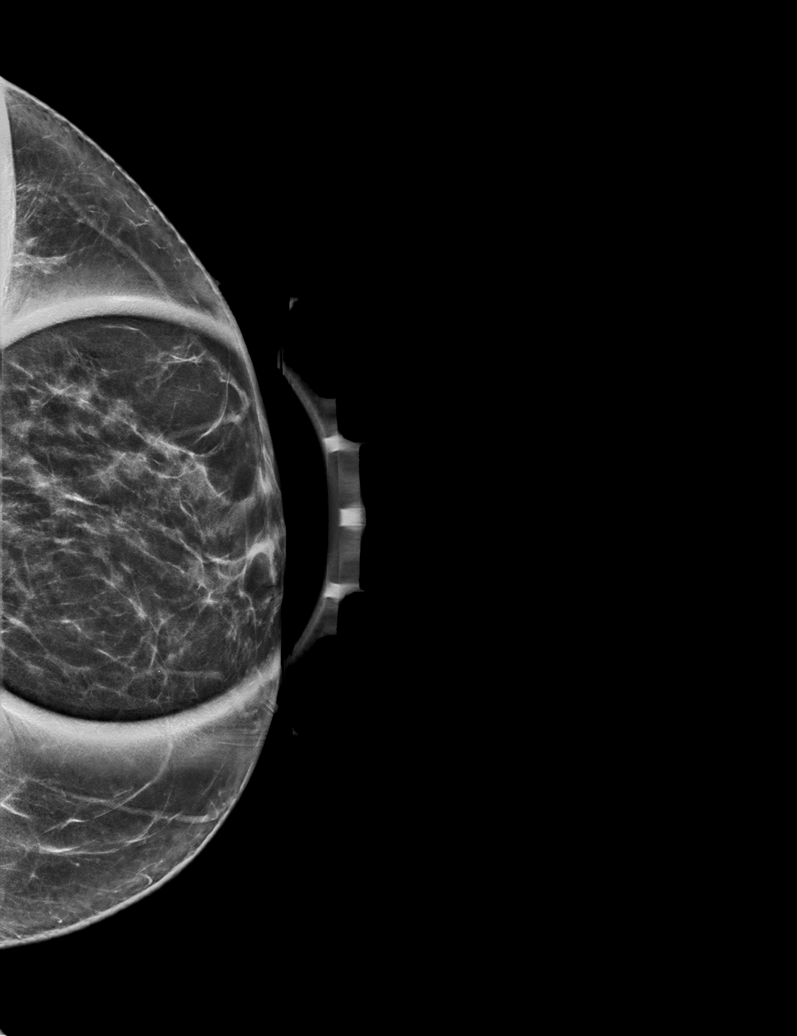

[R ML synth-2D]
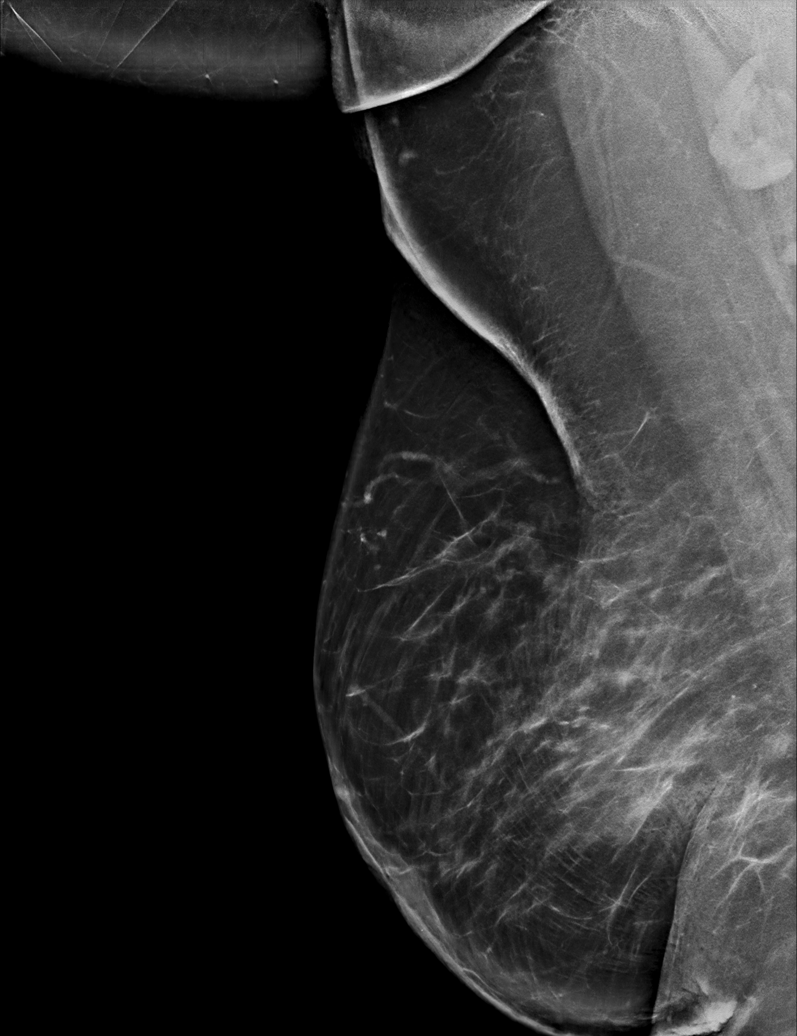

[L MLO synth-2D]
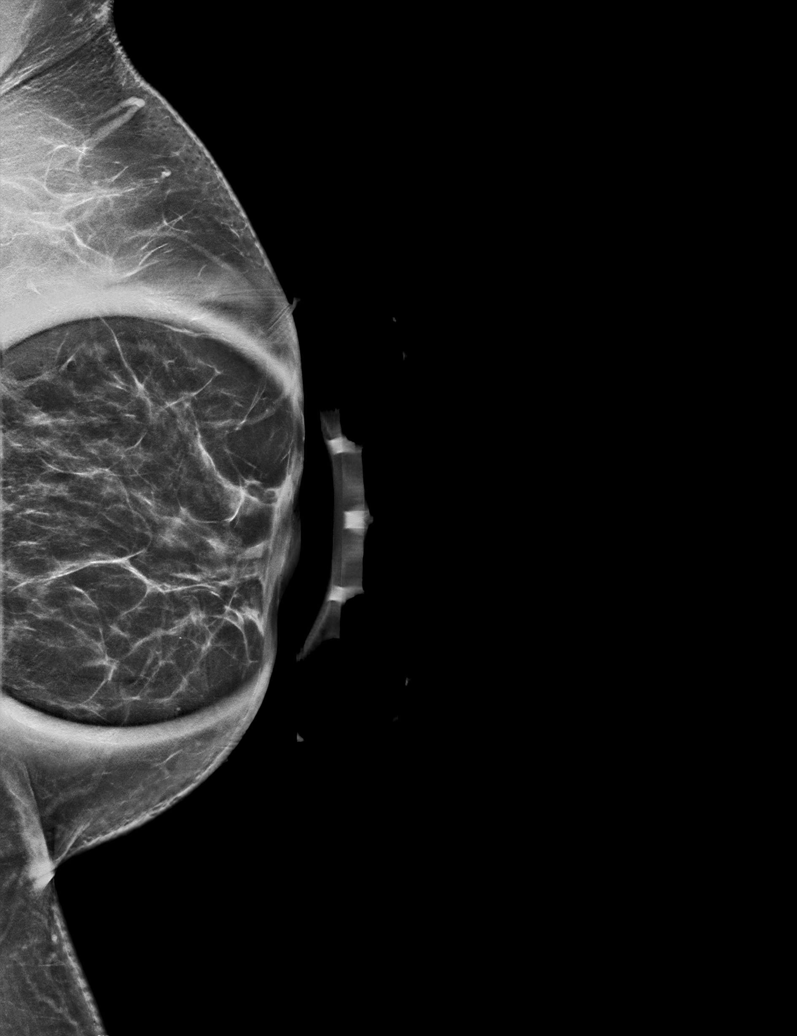

[R MLO synth-2D (1 of 2)]
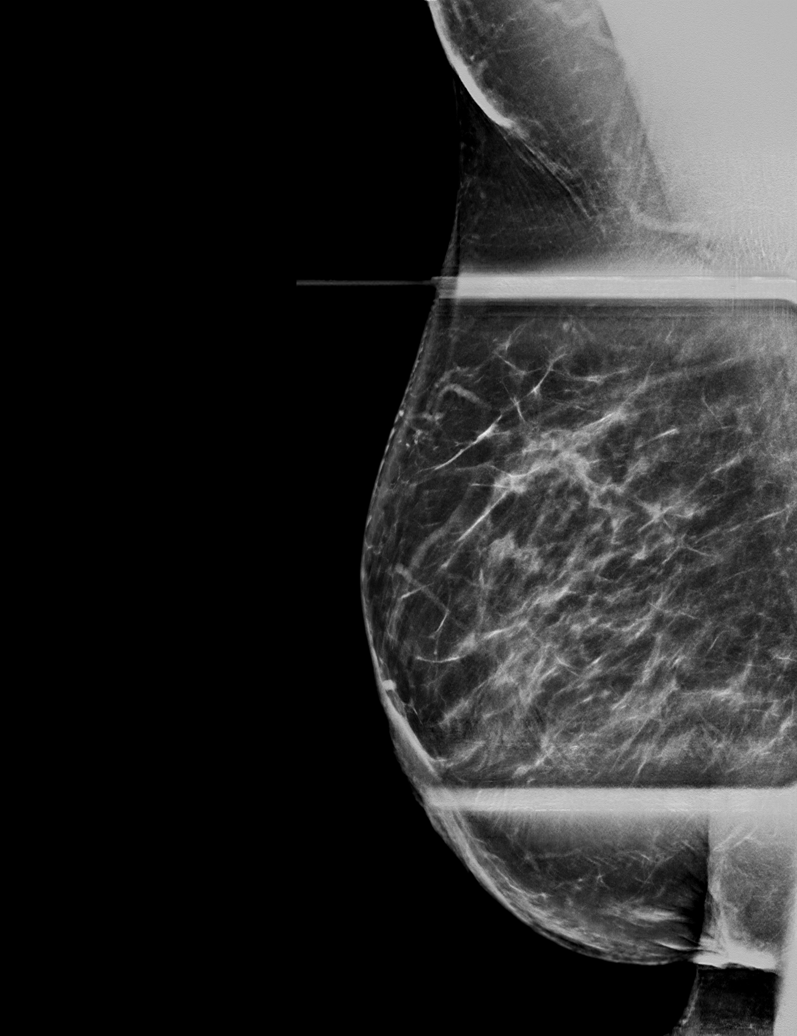

[R MLO synth-2D (2 of 2)]
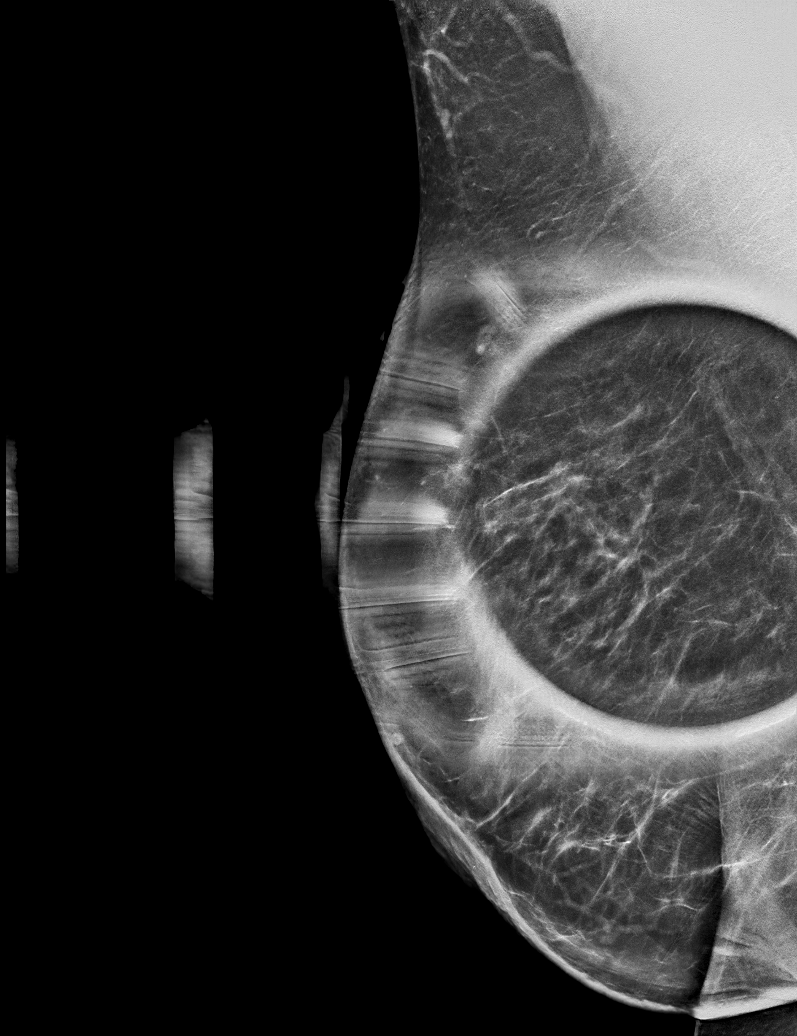

[R ML tomo · tomo slice 49/98.0]
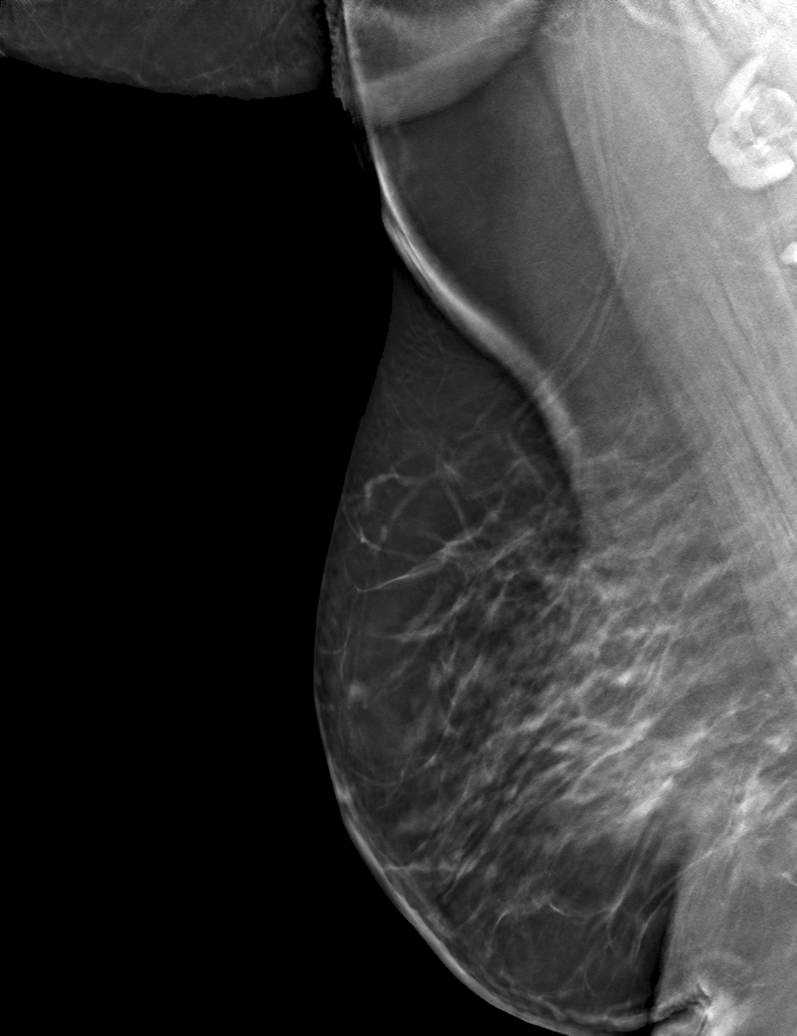

[6 of 30 positions shown; findings below may reference images not displayed]

ACR Breast Density Category c: The breast tissue is heterogeneously
dense, which may obscure small masses.
FINDINGS: No definite persistent masses are seen on the spot compression
tomosynthesis images through the lateral posterior right breast.
There is a possible obscured mass in the retroareolar left breast on
the spot compression tomosynthesis images.

Mammographic images were processed with CAD.

No suspicious masses are seen in the retroareolar left breast on
ultrasound. Scattered fibrocystic changes are noted.

Ultrasound of the lateral right breast demonstrates multiple
scattered benign-appearing cysts. She does have a few prominent
dilated ducts at the 9-930 o'clock position.

At 9 o'clock, 9 cm from the nipple there is a complex mass which is
predominantly solid but does demonstrate a small cystic space. The
mass measures 0.6 x 0.3 x 0.6 cm.

Ultrasound of the right breast at 9 o'clock, 5 cm from the nipple
demonstrates a second similar-appearing complex mass measuring
approximately 1.2 x 0.4 x 1.1 cm.

Ultrasound of the right axilla demonstrates multiple
normal-appearing lymph nodes.
IMPRESSION: 1. There is an indeterminate 0.6 cm mass in the right breast at 9
o'clock, 9 cm from the nipple.

2. There is an indeterminate 1.2 cm mass in the right breast at 9
o'clock, 5 cm from the nipple.

3.  No evidence of right axillary lymphadenopathy.

4. No persistent mammographic or targeted sonographic findings are
seen in the retroareolar left breast.

RECOMMENDATION:
1. Ultrasound-guided biopsy recommended for the 2 right breast
masses at 9 o'clock.

I have discussed the findings and recommendations with the patient.
If applicable, a reminder letter will be sent to the patient
regarding the next appointment.

BI-RADS CATEGORY  4: Suspicious.

## 2022-05-16 ENCOUNTER — Telehealth: Payer: Self-pay

## 2022-05-16 DIAGNOSIS — F603 Borderline personality disorder: Secondary | ICD-10-CM

## 2022-05-16 DIAGNOSIS — F3174 Bipolar disorder, in full remission, most recent episode manic: Secondary | ICD-10-CM

## 2022-05-16 NOTE — Telephone Encounter (Signed)
Will have CMA contact patient to verify if pharmacy is still [facility] or if it is changed to [facility].

## 2022-05-16 NOTE — Telephone Encounter (Signed)
received fax requesting a refill on the trazodone, bupropion and mirtazapine.

## 2022-05-17 ENCOUNTER — Other Ambulatory Visit: Payer: Self-pay | Admitting: Psychiatry

## 2022-05-17 DIAGNOSIS — F3174 Bipolar disorder, in full remission, most recent episode manic: Secondary | ICD-10-CM

## 2022-05-17 DIAGNOSIS — F603 Borderline personality disorder: Secondary | ICD-10-CM

## 2022-05-17 MED ORDER — MIRTAZAPINE 15 MG PO TABS: 15.0000 mg | ORAL_TABLET | Freq: Every day | ORAL | 0 refills | Status: DC

## 2022-05-17 MED ORDER — BUPROPION HCL ER (XL) 150 MG PO TB24
150.0000 mg | ORAL_TABLET | Freq: Every day | ORAL | 0 refills | Status: DC
Start: 2022-05-17 — End: 2022-07-22

## 2022-05-17 MED ORDER — TRAZODONE HCL 100 MG PO TABS: 100.0000 mg | ORAL_TABLET | Freq: Every evening | ORAL | 0 refills | Status: DC | PRN

## 2022-05-17 NOTE — Telephone Encounter (Signed)
Ordered

## 2022-05-17 NOTE — Telephone Encounter (Signed)
pt husband called back states that they don't use [facility] anymore that their insurance has changed and now has to use [facility]. please [facility] rxs[facility]. ( i updated the pharmacy in the system)

## 2022-05-25 ENCOUNTER — Other Ambulatory Visit: Payer: Self-pay | Admitting: Family

## 2022-05-25 DIAGNOSIS — F3174 Bipolar disorder, in full remission, most recent episode manic: Secondary | ICD-10-CM

## 2022-06-07 ENCOUNTER — Other Ambulatory Visit: Payer: Self-pay | Admitting: Family

## 2022-06-10 ENCOUNTER — Other Ambulatory Visit: Payer: Self-pay | Admitting: Anesthesiology

## 2022-06-10 ENCOUNTER — Encounter: Payer: Self-pay | Admitting: Anesthesiology

## 2022-06-10 DIAGNOSIS — M545 Low back pain, unspecified: Secondary | ICD-10-CM

## 2022-06-10 DIAGNOSIS — G8929 Other chronic pain: Secondary | ICD-10-CM

## 2022-06-13 ENCOUNTER — Ambulatory Visit: Admission: RE | Admit: 2022-06-13 | Payer: Medicare HMO | Source: Ambulatory Visit

## 2022-06-13 ENCOUNTER — Other Ambulatory Visit: Payer: Self-pay | Admitting: Anesthesiology

## 2022-06-13 DIAGNOSIS — M545 Low back pain, unspecified: Secondary | ICD-10-CM

## 2022-06-20 ENCOUNTER — Ambulatory Visit: Payer: Medicare HMO

## 2022-06-27 ENCOUNTER — Other Ambulatory Visit: Payer: Medicare HMO

## 2022-07-01 ENCOUNTER — Other Ambulatory Visit: Payer: Self-pay | Admitting: Psychiatry

## 2022-07-01 DIAGNOSIS — F3132 Bipolar disorder, current episode depressed, moderate: Secondary | ICD-10-CM

## 2022-07-02 ENCOUNTER — Telehealth: Payer: Self-pay | Admitting: Psychiatry

## 2022-07-02 NOTE — Telephone Encounter (Signed)
Please contact patient to verify what medications she needs refill on ,since per EHR , she is not due for any refills at this time.

## 2022-07-02 NOTE — Telephone Encounter (Signed)
Patient called and requested refills on all her medications, she is currently out of medication. Patient requested medication to be sent to [facility]-Please advise

## 2022-07-05 NOTE — Telephone Encounter (Signed)
pt husband states that she has enough medication. he states that the trazodone she has enough to get to her next appt and that the lamictal 25 mg was just sent.

## 2022-07-09 ENCOUNTER — Emergency Department
Admission: EM | Admit: 2022-07-09 | Discharge: 2022-07-09 | Disposition: A | Discharge status: Home / Self Care | Payer: Medicare HMO | Attending: Emergency Medicine | Admitting: Emergency Medicine

## 2022-07-09 ENCOUNTER — Encounter: Payer: Self-pay | Admitting: *Deleted

## 2022-07-09 ENCOUNTER — Other Ambulatory Visit: Payer: Self-pay

## 2022-07-09 DIAGNOSIS — M5431 Sciatica, right side: Secondary | ICD-10-CM | POA: Insufficient documentation

## 2022-07-09 MED ORDER — OXYCODONE HCL 5 MG PO TABS: 5.0000 mg | ORAL_TABLET | Freq: Three times a day (TID) | ORAL | 0 refills | Status: AC | PRN

## 2022-07-09 MED ORDER — PREDNISONE 10 MG PO TABS
30.0000 mg | ORAL_TABLET | Freq: Every day | ORAL | 0 refills | Status: AC
Start: 2022-07-09 — End: 2022-07-14

## 2022-07-09 NOTE — ED Provider Notes (Signed)
[facility] Provider Note    Event Date/Time   First MD Initiated Contact with Patient 07/09/22 1617     (approximate)   History   Leg Pain   HPI  Marilyn Davis is a 67 y.o. female presents to the emergency department with back pain.  Lower back pain that radiates to the right leg and ends at the knee that has been ongoing for the past 5 weeks.  Acutely worsened over the past couple of days.  Taking tramadol and gabapentin without significant improvement.  Takes Lamictal.  Denies any urinary or bowel incontinence.  No numbness or weakness.  No new falls or trauma.  Attempting to get an MRI as an outpatient but her insurance keeps declining.     Physical Exam   Triage Vital Signs: ED Triage Vitals  Enc Vitals Group     BP 07/09/22 1513 123/66     Pulse Rate 07/09/22 1513 77     Resp 07/09/22 1513 18     Temp 07/09/22 1513 98.7 F (37.1 C)     Temp Source 07/09/22 1513 Oral     SpO2 07/09/22 1513 93 %     Weight 07/09/22 1523 214 lb (97.1 kg)     Height --      Head Circumference --      Peak Flow --      Pain Score 07/09/22 1522 8     Pain Loc --      Pain Edu? --      Excl. in GC? --     Most recent vital signs: Vitals:   07/09/22 1513  BP: 123/66  Pulse: 77  Resp: 18  Temp: 98.7 F (37.1 C)  SpO2: 93%    Physical Exam Constitutional:      Appearance: She is well-developed.  HENT:     Head: Atraumatic.  Eyes:     Conjunctiva/sclera: Conjunctivae normal.  Cardiovascular:     Rate and Rhythm: Regular rhythm.  Pulmonary:     Effort: No respiratory distress.  Abdominal:     General: There is no distension.  Musculoskeletal:        General: Normal range of motion.     Cervical back: Normal range of motion.     Comments: Positive straight leg raise to the right side.  +2 DP pulses.  Sensation intact throughout the bilateral lower extremities.  No saddle anesthesia.  5/5 strength bilateral lower extremities.  Skin:     General: Skin is warm.  Neurological:     Mental Status: She is alert. Mental status is at baseline.      IMPRESSION / MDM / ASSESSMENT AND PLAN / ED COURSE  I reviewed the triage vital signs and the nursing notes.  Differential diagnosis including sciatica, piriformis syndrome, radicular pain, disc herniation.  Clinical picture is not consistent with cauda equina or epidural compression syndrome.  Able to ambulate in the emergency department.  No weakness, urinary or bowel incontinence.  Does not meet criteria for an emergent MRI.  History of solitary kidney so want to refrain from any NSAIDs.  Discussed scheduling Tylenol and will switch from tramadol to oxycodone.  Will do a short course of prednisone.    Labs (all labs ordered are listed, but only abnormal results are displayed) Labs interpreted as -    Labs Reviewed - No data to display    Given return precautions for any worsening symptoms.  Clinical picture is not consistent with  AAA, pyelonephritis, urinary tract infection, low suspicion for osteomyelitis or abscess.   PROCEDURES:  Critical Care performed: No  Procedures  Patient's presentation is most consistent with acute illness / injury with system symptoms.   MEDICATIONS ORDERED IN ED: Medications - No data to display  FINAL CLINICAL IMPRESSION(S) / ED DIAGNOSES   Final diagnoses:  Sciatica of right side     Rx / DC Orders   ED Discharge Orders          Ordered    predniSONE (DELTASONE) 10 MG tablet  Daily with breakfast        07/09/22 1717    oxyCODONE (ROXICODONE) 5 MG immediate release tablet  Every 8 hours PRN        07/09/22 1717             Note:  This document was prepared using Dragon voice recognition software and may include unintentional dictation errors.   Corena Herter, MD 07/09/22 [id number]

## 2022-07-09 NOTE — ED Triage Notes (Signed)
BIB husband for recurrent chronic unrelenting severe sciatica type pain, radiates to R lower back, buttocks, hip and leg, denies sx other than pain, ongoing for 5 weeks, unable to get in touch with back MD, no relief with gabapentin and tramadol. Alert, NAD, calm, interactive, sitting in w/c.

## 2022-07-09 NOTE — Discharge Instructions (Addendum)
You were seen in the emergency department with symptoms concerning for sciatica.  It is importantly follow-up with your primary care physician to discuss further outpatient MRI.  Return to the emergency department for any worsening symptoms.  You were given a prescription for oxycodone and a steroid.  Start taking Tylenol.  You can discontinue tramadol if it is not helping with your pain.  Stop taking tramadol if it is not helping with your pain.  You can try an oxycodone as needed for severe pain.  Start 1000 mg of Tylenol (2 extra strength) every 6 hours for pain control.  If you are still in severe pain you can then try an oxycodone.  Prednisone -you are given a prescription for a steroid.  It is important that you take this medication with food.  This medication can cause an upset stomach.  It also can increase your glucose if you have a history of diabetes, so it is important that you check your glucose frequently while you are on this medication.

## 2022-07-11 ENCOUNTER — Other Ambulatory Visit: Payer: Self-pay | Admitting: Infectious Diseases

## 2022-07-11 DIAGNOSIS — M5431 Sciatica, right side: Secondary | ICD-10-CM

## 2022-07-18 ENCOUNTER — Ambulatory Visit
Admission: RE | Admit: 2022-07-18 | Discharge: 2022-07-18 | Disposition: A | Discharge status: Home / Self Care | Payer: Medicare HMO | Source: Ambulatory Visit | Attending: Infectious Diseases | Admitting: Infectious Diseases

## 2022-07-18 DIAGNOSIS — M5431 Sciatica, right side: Secondary | ICD-10-CM | POA: Diagnosis not present

## 2022-07-18 MED ORDER — GADOBUTROL 1 MMOL/ML IV SOLN
9.0000 mL | Freq: Once | INTRAVENOUS | Status: AC | PRN
  Administered 2022-07-18: 9 mL via INTRAVENOUS

## 2022-07-19 ENCOUNTER — Telehealth (INDEPENDENT_AMBULATORY_CARE_PROVIDER_SITE_OTHER): Payer: Medicare HMO | Admitting: Psychiatry

## 2022-07-19 DIAGNOSIS — F3174 Bipolar disorder, in full remission, most recent episode manic: Secondary | ICD-10-CM

## 2022-07-19 NOTE — Progress Notes (Signed)
When writer attempted to connect with patient at the time of appointment , spouse picked up phone and said patient was sleeping and they were not aware of appointment. Communicated with staff who stated this appointment was made by patient in march.

## 2022-07-22 ENCOUNTER — Ambulatory Visit (INDEPENDENT_AMBULATORY_CARE_PROVIDER_SITE_OTHER): Payer: Medicare HMO | Admitting: Psychiatry

## 2022-07-22 ENCOUNTER — Encounter: Payer: Self-pay | Admitting: Psychiatry

## 2022-07-22 VITALS — BP 104/74 | HR 80 | Temp 97.2°F | Ht 65.25 in | Wt 214.4 lb

## 2022-07-22 DIAGNOSIS — F3174 Bipolar disorder, in full remission, most recent episode manic: Secondary | ICD-10-CM | POA: Diagnosis not present

## 2022-07-22 DIAGNOSIS — Z8659 Personal history of other mental and behavioral disorders: Secondary | ICD-10-CM

## 2022-07-22 DIAGNOSIS — R251 Tremor, unspecified: Secondary | ICD-10-CM | POA: Diagnosis not present

## 2022-07-22 DIAGNOSIS — F840 Autistic disorder: Secondary | ICD-10-CM | POA: Diagnosis not present

## 2022-07-22 DIAGNOSIS — F603 Borderline personality disorder: Secondary | ICD-10-CM

## 2022-07-22 MED ORDER — BUPROPION HCL ER (XL) 150 MG PO TB24
150.0000 mg | ORAL_TABLET | Freq: Every day | ORAL | 0 refills | Status: DC
Start: 2022-07-22 — End: 2022-10-24

## 2022-07-22 MED ORDER — LAMOTRIGINE 200 MG PO TABS: 200.0000 mg | ORAL_TABLET | Freq: Every day | ORAL | 1 refills | Status: DC

## 2022-07-22 MED ORDER — TRAZODONE HCL 100 MG PO TABS
100.0000 mg | ORAL_TABLET | Freq: Every evening | ORAL | 0 refills | Status: DC | PRN
Start: 2022-07-22 — End: 2022-08-13

## 2022-07-22 MED ORDER — QUETIAPINE FUMARATE 25 MG PO TABS: 125.0000 mg | ORAL_TABLET | ORAL | 0 refills | Status: DC

## 2022-07-22 MED ORDER — MIRTAZAPINE 15 MG PO TABS
15.0000 mg | ORAL_TABLET | Freq: Every day | ORAL | 0 refills | Status: DC
Start: 2022-07-22 — End: 2022-10-24

## 2022-07-22 NOTE — Progress Notes (Unsigned)
BH MD OP Progress Note  07/22/2022 12:04 PM Marilyn Davis  MRN:  [id number]  Chief Complaint:  Chief Complaint  Patient presents with   Follow-up   Anxiety   Depression   Medication Refill   HPI: Marilyn Davis is a 67 year old Caucasian female, has a history of bipolar disorder, borderline personality disorder, autism spectrum, multiple medical problems including diabetes mellitus, sciatica, dizziness, tremors was evaluated in office today.  Patient today appeared to be in pain, reports bilateral sciatica, rates it as 6 out of 10 today.  Currently on oxycodone.  Has upcoming appointment with her provider for management.  The pain does have an impact on her mood.  She reports she does not feel depressed however ' feels exhausted.'  She is okay with her current medication regimen.  Interested in coming off of some of her medications if possible.  Patient reports sleep is overall good.  She wakes up feeling rested most days.  Patient denies any suicidality, homicidality or perceptual disturbances.  Patient appeared to be alert, oriented to person place time situation.  3 word memory immediate 3 out of 3, after 5 minutes 3 out of 3.  Patient was able to spell the word ' WORLD' forward and backward.  Patient denies any other concerns today.  Visit Diagnosis:    ICD-10-CM   1. Bipolar 1 disorder, manic, full remission (HCC)  F31.74 traZODone (DESYREL) 100 MG tablet    mirtazapine (REMERON) 15 MG tablet    lamoTRIgine (LAMICTAL) 200 MG tablet    QUEtiapine (SEROQUEL) 25 MG tablet    buPROPion (WELLBUTRIN XL) 150 MG 24 hr tablet    2. Borderline personality disorder (HCC)  F60.3 traZODone (DESYREL) 100 MG tablet    QUEtiapine (SEROQUEL) 25 MG tablet    3. Tremor  R25.1    Likely drug induced    4. Autism spectrum disorder  F84.0     5. History of posttraumatic stress disorder (PTSD)  Z86.59       Past Psychiatric History: I have reviewed past psychiatric history from  progress note on 07/31/2021.  Past Medical History:  Past Medical History:  Diagnosis Date   Bipolar disorder (HCC)    Diabetes mellitus without complication (HCC)    Sciatica     Past Surgical History:  Procedure Laterality Date   ABDOMINAL HYSTERECTOMY     BREAST BIOPSY Right 09/30/2019   9:00, 5cmfn, Q shape, neg   BREAST BIOPSY Right 09/30/2019   9:00, 9cmfn, vision, neg   EXPLORATORY LAPAROTOMY     SHOULDER SURGERY     TONSILLECTOMY      Family Psychiatric History: I have reviewed family psychiatric history from progress note on 07/31/2021.  Family History:  Family History  Problem Relation Age of Onset   Personality disorder Mother    Bipolar disorder Father    Alcohol abuse Brother    Drug abuse Brother    Suicidality Other    Breast cancer Neg Hx     Social History: I have reviewed social history from progress note on 07/31/2021. Social History   Socioeconomic History   Marital status: Married    Spouse name: johnnie   Number of children: 2   Years of education: Not on file   Highest education level: Some college, no degree  Occupational History   Not on file  Tobacco Use   Smoking status: Never   Smokeless tobacco: Never  Vaping Use   Vaping Use: Never used  Substance and  Sexual Activity   Alcohol use: Never   Drug use: Never   Sexual activity: Not Currently  Other Topics Concern   Not on file  Social History Narrative   Not on file   Social Determinants of Health   Financial Resource Strain: Not on file  Food Insecurity: Not on file  Transportation Needs: Not on file  Physical Activity: Not on file  Stress: Not on file  Social Connections: Not on file    Allergies:  Allergies  Allergen Reactions   Sumatriptan Shortness Of Breath    Facial numbness   Pseudoephedrine Hcl Other (See Comments)    manic Other reaction(s): Hallucinations   Cabbage Nausea And Vomiting    Cooked    Ibuprofen Itching    Metabolic Disorder Labs: Lab  Results  Component Value Date   HGBA1C 6.2 (H) 03/13/2019   MPG 131.24 03/13/2019   Lab Results  Component Value Date   PROLACTIN 17.2 08/02/2021   No results found for: "CHOL", "TRIG", "HDL", "CHOLHDL", "VLDL", "LDLCALC" Lab Results  Component Value Date   TSH 1.231 08/02/2021   TSH 1.178 03/13/2019    Therapeutic Level Labs: No results found for: "LITHIUM" No results found for: "VALPROATE" No results found for: "CBMZ"  Current Medications: Current Outpatient Medications  Medication Sig Dispense Refill   alendronate (FOSAMAX) 70 MG tablet Take 70 mg by mouth once a week.     atorvastatin (LIPITOR) 10 MG tablet Take 1 tablet (10 mg total) by mouth daily at 6 PM. 30 tablet 1   B-D UF III MINI PEN NEEDLES 31G X 5 MM MISC Inject into the skin.     benztropine (COGENTIN) 0.5 MG tablet Take 1 tablet (0.5 mg total) by mouth daily as needed for tremors. 90 tablet 1   fluticasone (FLONASE) 50 MCG/ACT nasal spray Place 2 sprays into both nostrils daily.     lamoTRIgine (LAMICTAL) 25 MG tablet TAKE 1 TABLET TWICE A DAY. TAKE ALONG WITH 200MG  DAILYFOR TOTAL OF 250MG  DAILY 180 tablet 0   linaclotide (LINZESS) 145 MCG CAPS capsule Take 145 mcg by mouth daily.     montelukast (SINGULAIR) 10 MG tablet Take 1 tablet (10 mg total) by mouth at bedtime. 30 tablet 1   oxyCODONE-acetaminophen (PERCOCET) 10-325 MG tablet Take 1 tablet by mouth 3 (three) times daily.     pantoprazole (PROTONIX) 40 MG tablet Take 40 mg by mouth daily.     propranolol (INDERAL) 20 MG tablet Take 20 mg by mouth 2 (two) times daily.     RYBELSUS 3 MG TABS Take 3 mg by mouth daily.     SUMAtriptan (IMITREX) 50 MG tablet Take by mouth.     buPROPion (WELLBUTRIN XL) 150 MG 24 hr tablet Take 1 tablet (150 mg total) by mouth daily. 90 tablet 0   lamoTRIgine (LAMICTAL) 200 MG tablet Take 1 tablet (200 mg total) by mouth daily. Take along with 50 mg daily - total daily dose of 250 mg 90 tablet 1   mirtazapine (REMERON) 15 MG  tablet Take 1 tablet (15 mg total) by mouth at bedtime. 90 tablet 0   QUEtiapine (SEROQUEL) 25 MG tablet Take 5 tablets (125 mg total) by mouth as directed. Take 1 tablet daily morning and 4 tablets daily at bedtime 450 tablet 0   traZODone (DESYREL) 100 MG tablet Take 1-2 tablets (100-200 mg total) by mouth at bedtime as needed for sleep. 180 tablet 0   No current facility-administered medications for this visit.  Musculoskeletal: Strength & Muscle Tone: within normal limits Gait & Station: unsteady Patient leans: N/A  Psychiatric Specialty Exam: Review of Systems  Musculoskeletal:        Sciatica - BL - pain 6/10   Psychiatric/Behavioral:         'Feels exhausted'    Blood pressure 104/74, pulse 80, temperature (!) 97.2 F (36.2 C), temperature source Skin, height 5' 5.25" (1.657 m), weight 214 lb 6.4 oz (97.3 kg).Body mass index is 35.41 kg/m.  General Appearance: Casual  Eye Contact:  Fair  Speech:  Normal Rate  Volume:  Normal  Mood:   does not believe she is depressed , but feels " exhausted"  Affect:  Appropriate  Thought Process:  Goal Directed and Descriptions of Associations: Intact  Orientation:  Full (Time, Place, and Person)  Thought Content: Logical   Suicidal Thoughts:  No  Homicidal Thoughts:  No  Memory:  Immediate;   Fair Recent;   Fair Remote;   Fair  Judgement:  Fair  Insight:  Fair  Psychomotor Activity:  Normal  Concentration:  Concentration: Fair and Attention Span: Fair  Recall:  [employer] of Knowledge: Fair  Language: Fair  Akathisia:  No  Handed:  Ambidextrous  AIMS (if indicated): done  Assets:  Communication Skills Desire for Improvement Housing Social Support  ADL's:  Intact  Cognition: WNL  Sleep:  Fair   Screenings: Midwife Visit from 07/22/2022 in [facility] Office Visit from 04/19/2022 in [facility] Office Visit from  02/14/2022 in [facility] Office Visit from 12/28/2021 in [facility] Office Visit from 10/23/2021 in [facility]  AIMS Total Score 0 0 0 0 0      AUDIT    Flowsheet Row Admission (Discharged) from 03/14/2019 in [facility]  Alcohol Use Disorder Identification Test Final Score (AUDIT) 0      GAD-7    Flowsheet Row Office Visit from 07/22/2022 in [facility] Office Visit from 04/19/2022 in [facility] Office Visit from 02/14/2022 in [facility] Office Visit from 12/28/2021 in [facility] Office Visit from 10/23/2021 in [facility]  Total GAD-7 Score 7 1 3  0 2      PHQ2-9    Flowsheet Row Office Visit from 07/22/2022 in [facility] Office Visit from 04/19/2022 in [facility] Office Visit from 02/14/2022 in [facility] Office Visit from 12/28/2021 in [facility] Office Visit from 10/23/2021 in [facility]  PHQ-2 Total Score 2 0 3 2 0  PHQ-9 Total Score 5 5 9 8  --      Flowsheet Row Office Visit from 07/22/2022 in [facility] ED from 07/09/2022 in [facility] at [facility] Visit from 04/19/2022 in [facility]  C-SSRS RISK CATEGORY No Risk No Risk Low Risk        Assessment and Plan: Marilyn Davis is a 67 year old Caucasian female who has a history of bipolar disorder, borderline personality disorder, autism spectrum disorder was evaluated  in office today.  Patient with bilateral sciatica pain does struggle with it although  reports current medications as beneficial for her mood, will benefit from the following plan.  Plan Bipolar disorder type I depressed moderate in remission Seroquel 25 mg p.o. daily in the morning, 100 mg p.o. daily at bedtime Mirtazapine 15 mg p.o. nightly. Trazodone 100-200 mg p.o. nightly as needed Lamotrigine 200 mg p.o. daily at bedtime, 50 mg p.o. daily during the day. Wellbutrin XL 150 mg p.o. daily   Borderline personality disorder-stable Patient has been referred for CBT-pending  Tremors rightly drug-induced, psychotropics-improving Cogentin 0.5 mg as needed for severe tremors   Autism spectrum disorder-chronic-stable Patient to continue CBT-was referred for the same  Patient to reach out to pain provider for management of pain.  Patient is interested in getting off of some of her psychotropics, patient to reach back once her pain is more under control.  Follow-up in clinic in 3 months or sooner if needed.   Collaboration of Care: Collaboration of Care: Referral or follow-up with counselor/therapist AEB patient encouraged to establish care with therapist.  Patient/Guardian was advised Release of Information must be obtained prior to any record release in order to collaborate their care with an outside provider. Patient/Guardian was advised if they have not already done so to contact the registration department to sign all necessary forms in order for [place] to release information regarding their care.   Consent: Patient/Guardian gives verbal consent for treatment and assignment of benefits for services provided during this visit. Patient/Guardian expressed understanding and agreed to proceed.  This note was generated in part or whole with voice recognition software. Voice recognition is usually quite accurate but there are transcription errors that can and very often do occur. I apologize for  any typographical errors that were not detected and corrected.     Jomarie Longs, MD 07/23/2022, 9:27 AM

## 2022-08-02 ENCOUNTER — Other Ambulatory Visit: Payer: Self-pay | Admitting: Family

## 2022-08-02 DIAGNOSIS — E782 Mixed hyperlipidemia: Secondary | ICD-10-CM

## 2022-08-09 ENCOUNTER — Other Ambulatory Visit: Payer: Self-pay | Admitting: Internal Medicine

## 2022-08-13 ENCOUNTER — Other Ambulatory Visit: Payer: Self-pay | Admitting: Psychiatry

## 2022-08-13 DIAGNOSIS — F3174 Bipolar disorder, in full remission, most recent episode manic: Secondary | ICD-10-CM

## 2022-08-13 DIAGNOSIS — F603 Borderline personality disorder: Secondary | ICD-10-CM

## 2022-08-20 ENCOUNTER — Ambulatory Visit
Payer: Medicare HMO | Attending: Student in an Organized Health Care Education/Training Program | Admitting: Student in an Organized Health Care Education/Training Program

## 2022-08-20 ENCOUNTER — Encounter: Payer: Self-pay | Admitting: Student in an Organized Health Care Education/Training Program

## 2022-08-20 VITALS — BP 99/69 | HR 88 | Temp 97.1°F | Resp 17 | Ht 65.0 in | Wt 205.7 lb

## 2022-08-20 DIAGNOSIS — M545 Low back pain, unspecified: Secondary | ICD-10-CM | POA: Diagnosis present

## 2022-08-20 DIAGNOSIS — M47816 Spondylosis without myelopathy or radiculopathy, lumbar region: Secondary | ICD-10-CM | POA: Diagnosis present

## 2022-08-20 DIAGNOSIS — G8929 Other chronic pain: Secondary | ICD-10-CM | POA: Insufficient documentation

## 2022-08-20 DIAGNOSIS — G894 Chronic pain syndrome: Secondary | ICD-10-CM | POA: Diagnosis present
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
## 2022-08-20 NOTE — Patient Instructions (Signed)
____________________________________________________________________________________________  General Risks and Possible Complications  Patient Responsibilities: It is important that you read this as it is part of your informed consent. It is our duty to inform you of the risks and possible complications associated with treatments offered to you. It is your responsibility as a patient to read this and to ask questions about anything that is not clear or that you believe was not covered in this document.  Patient's Rights: You have the right to refuse treatment. You also have the right to change your mind, even after initially having agreed to have the treatment done. However, under this last option, if you wait until the last second to change your mind, you may be charged for the materials used up to that point.  Introduction: Medicine is not an exact science. Everything in Medicine, including the lack of treatment(s), carries the potential for danger, harm, or loss (which is by definition: Risk). In Medicine, a complication is a secondary problem, condition, or disease that can aggravate an already existing one. All treatments carry the risk of possible complications. The fact that a side effects or complications occurs, does not imply that the treatment was conducted incorrectly. It must be clearly understood that these can happen even when everything is done following the highest safety standards.  No treatment: You can choose not to proceed with the proposed treatment alternative. The "PRO(s)" would include: avoiding the risk of complications associated with the therapy. The "CON(s)" would include: not getting any of the treatment benefits. These benefits fall under one of three categories: diagnostic; therapeutic; and/or palliative. Diagnostic benefits include: getting information which can ultimately lead to improvement of the disease or symptom(s). Therapeutic benefits are those associated with  the successful treatment of the disease. Finally, palliative benefits are those related to the decrease of the primary symptoms, without necessarily curing the condition (example: decreasing the pain from a flare-up of a chronic condition, such as incurable terminal cancer).  General Risks and Complications: These are associated to most interventional treatments. They can occur alone, or in combination. They fall under one of the following six (6) categories: no benefit or worsening of symptoms; bleeding; infection; nerve damage; allergic reactions; and/or death. No benefits or worsening of symptoms: In Medicine there are no guarantees, only probabilities. No healthcare provider can ever guarantee that a medical treatment will work, they can only state the probability that it may. Furthermore, there is always the possibility that the condition may worsen, either directly, or indirectly, as a consequence of the treatment. Bleeding: This is more common if the patient is taking a blood thinner, either prescription or over the counter (example: Goody Powders, Fish oil, Aspirin, Garlic, etc.), or if suffering a condition associated with impaired coagulation (example: Hemophilia, cirrhosis of the liver, low platelet counts, etc.). However, even if you do not have one on these, it can still happen. If you have any of these conditions, or take one of these drugs, make sure to notify your treating physician. Infection: This is more common in patients with a compromised immune system, either due to disease (example: diabetes, cancer, human immunodeficiency virus [HIV], etc.), or due to medications or treatments (example: therapies used to treat cancer and rheumatological diseases). However, even if you do not have one on these, it can still happen. If you have any of these conditions, or take one of these drugs, make sure to notify your treating physician. Nerve Damage: This is more common when the treatment is an    invasive one, but it can also happen with the use of medications, such as those used in the treatment of cancer. The damage can occur to small secondary nerves, or to large primary ones, such as those in the spinal cord and brain. This damage may be temporary or permanent and it may lead to impairments that can range from temporary numbness to permanent paralysis and/or brain death. Allergic Reactions: Any time a substance or material comes in contact with our body, there is the possibility of an allergic reaction. These can range from a mild skin rash (contact dermatitis) to a severe systemic reaction (anaphylactic reaction), which can result in death. Death: In general, any medical intervention can result in death, most of the time due to an unforeseen complication. ____________________________________________________________________________________________    ______________________________________________________________________  Preparing for your procedure  Appointments: If you think you may not be able to keep your appointment, call 24-48 hours in advance to cancel. We need time to make it available to others.  During your procedure appointment there will be: No Prescription Refills. No disability issues to discussed. No medication changes or discussions.  Instructions: Food intake: Avoid eating anything solid for at least 8 hours prior to your procedure. Clear liquid intake: You may take clear liquids such as water up to 2 hours prior to your procedure. (No carbonated drinks. No soda.) Transportation: Unless otherwise stated by your physician, bring a driver. Morning Medicines: Except for blood thinners, take all of your other morning medications with a sip of water. Make sure to take your heart and blood pressure medicines. If your blood pressure's lower number is above 100, the case will be rescheduled. Blood thinners: Make sure to stop your blood thinners as instructed.  If you take  a blood thinner, but were not instructed to stop it, call our office [phone] and ask to talk to a nurse. Not stopping a blood thinner prior to certain procedures could lead to serious complications. Diabetics on insulin: Notify the staff so that you can be scheduled 1st case in the morning. If your diabetes requires high dose insulin, take only  of your normal insulin dose the morning of the procedure and notify the staff that you have done so. Preventing infections: Shower with an antibacterial soap the morning of your procedure.  Build-up your immune system: Take 1000 mg of Vitamin C with every meal (3 times a day) the day prior to your procedure. Antibiotics: Inform the nursing staff if you are taking any antibiotics or if you have any conditions that may require antibiotics prior to procedures. (Example: recent joint implants)   Pregnancy: If you are pregnant make sure to notify the nursing staff. Not doing so may result in injury to the fetus, including death.  Sickness: If you have a cold, fever, or any active infections, call and cancel or reschedule your procedure. Receiving steroids while having an infection may result in complications. Arrival: You must be in the facility at least 30 minutes prior to your scheduled procedure. Tardiness: Your scheduled time is also the cutoff time. If you do not arrive at least 15 minutes prior to your procedure, you will be rescheduled.  Children: Do not bring any children with you. Make arrangements to keep them home. Dress appropriately: There is always a possibility that your clothing may get soiled. Avoid long dresses. Valuables: Do not bring any jewelry or valuables.  Reasons to call and reschedule or cancel your procedure: (Following these recommendations will minimize the risk   of a serious complication.) Surgeries: Avoid having procedures within 2 weeks of any surgery. (Avoid for 2 weeks before or after any surgery). Flu Shots: Avoid having  procedures within 2 weeks of a flu shots or . (Avoid for 2 weeks before or after immunizations). Barium: Avoid having a procedure within 7-10 days after having had a radiological study involving the use of radiological contrast. (Myelograms, Barium swallow or enema study). Heart attacks: Avoid any elective procedures or surgeries for the initial 6 months after a "Myocardial Infarction" (Heart Attack). Blood thinners: It is imperative that you stop these medications before procedures. Let us know if you if you take any blood thinner.  Infection: Avoid procedures during or within two weeks of an infection (including chest colds or gastrointestinal problems). Symptoms associated with infections include: Localized redness, fever, chills, night sweats or profuse sweating, burning sensation when voiding, cough, congestion, stuffiness, runny nose, sore throat, diarrhea, nausea, vomiting, cold or Flu symptoms, recent or current infections. It is specially important if the infection is over the area that we intend to treat. Heart and lung problems: Symptoms that may suggest an active cardiopulmonary problem include: cough, chest pain, breathing difficulties or shortness of breath, dizziness, ankle swelling, uncontrolled high or unusually low blood pressure, and/or palpitations. If you are experiencing any of these symptoms, cancel your procedure and contact your primary care physician for an evaluation.  Remember:  Regular Business hours are:  Monday to Thursday 8:00 AM to 4:00 PM  Provider's Schedule: Francisco Naveira, MD:  Procedure days: Tuesday and Thursday 7:30 AM to 4:00 PM  Bilal Lateef, MD:  Procedure days: Monday and Wednesday 7:30 AM to 4:00 PM  ______________________________________________________________________   Facet Blocks Patient Information  Description: The facets are joints in the spine between the vertebrae.  Like any joints in the body, facets can become irritated and painful.   Arthritis can also effect the facets.  By injecting steroids and local anesthetic in and around these joints, we can temporarily block the nerve supply to them.  Steroids act directly on irritated nerves and tissues to reduce selling and inflammation which often leads to decreased pain.  Facet blocks may be done anywhere along the spine from the neck to the low back depending upon the location of your pain.   After numbing the skin with local anesthetic (like Novocaine), a small needle is passed onto the facet joints under x-ray guidance.  You may experience a sensation of pressure while this is being done.  The entire block usually lasts about 15-25 minutes.   Conditions which may be treated by facet blocks:  Low back/buttock pain Neck/shoulder pain Certain types of headaches  Preparation for the injection:  Do not eat any solid food or dairy products within 8 hours of your appointment. You may drink clear liquid up to 3 hours before appointment.  Clear liquids include water, black coffee, juice or soda.  No milk or cream please. You may take your regular medication, including pain medications, with a sip of water before your appointment.  Diabetics should hold regular insulin (if taken separately) and take 1/2 normal NPH dose the morning of the procedure.  Carry some sugar containing items with you to your appointment. A driver must accompany you and be prepared to drive you home after your procedure. Bring all your current medications with you. An IV may be inserted and sedation may be given at the discretion of the physician. A blood pressure cuff, EKG and other monitors will   often be applied during the procedure.  Some patients may need to have extra oxygen administered for a short period. You will be asked to provide medical information, including your allergies and medications, prior to the procedure.  We must know immediately if you are taking blood thinners (like Coumadin/Warfarin) or if  you are allergic to IV iodine contrast (dye).  We must know if you could possible be pregnant.  Possible side-effects:  Bleeding from needle site Infection (rare, may require surgery) Nerve injury (rare) Numbness & tingling (temporary) Difficulty urinating (rare, temporary) Spinal headache (a headache worse with upright posture) Light-headedness (temporary) Pain at injection site (serveral days) Decreased blood pressure (rare, temporary) Weakness in arm/leg (temporary) Pressure sensation in back/neck (temporary)   Call if you experience:  Fever/chills associated with headache or increased back/neck pain Headache worsened by an upright position New onset, weakness or numbness of an extremity below the injection site Hives or difficulty breathing (go to the emergency room) Inflammation or drainage at the injection site(s) Severe back/neck pain greater than usual New symptoms which are concerning to you  Please note:  Although the local anesthetic injected can often make your back or neck feel good for several hours after the injection, the pain will likely return. It takes 3-7 days for steroids to work.  You may not notice any pain relief for at least one week.  If effective, we will often do a series of 2-3 injections spaced 3-6 weeks apart to maximally decrease your pain.  After the initial series, you may be a candidate for a more permanent nerve block of the facets.  If you have any questions, please call [phone] [facility] 

## 2022-08-20 NOTE — Progress Notes (Signed)
Safety precautions to be maintained throughout the outpatient stay will include: orient to surroundings, keep bed in low position, maintain call bell within reach at all times, provide assistance with transfer out of bed and ambulation.  

## 2022-09-04 ENCOUNTER — Other Ambulatory Visit: Payer: Self-pay | Admitting: Family

## 2022-09-04 ENCOUNTER — Other Ambulatory Visit: Payer: Self-pay | Admitting: Internal Medicine

## 2022-09-11 ENCOUNTER — Ambulatory Visit
Payer: Medicare HMO | Attending: Student in an Organized Health Care Education/Training Program | Admitting: Student in an Organized Health Care Education/Training Program

## 2022-09-11 ENCOUNTER — Ambulatory Visit
Admission: RE | Admit: 2022-09-11 | Discharge: 2022-09-11 | Disposition: A | Discharge status: Home / Self Care | Payer: Medicare HMO | Source: Ambulatory Visit | Attending: Student in an Organized Health Care Education/Training Program | Admitting: Student in an Organized Health Care Education/Training Program

## 2022-09-11 ENCOUNTER — Encounter: Payer: Self-pay | Admitting: Student in an Organized Health Care Education/Training Program

## 2022-09-11 DIAGNOSIS — G894 Chronic pain syndrome: Secondary | ICD-10-CM | POA: Insufficient documentation

## 2022-09-11 DIAGNOSIS — M47816 Spondylosis without myelopathy or radiculopathy, lumbar region: Secondary | ICD-10-CM | POA: Diagnosis present

## 2022-09-11 MED ORDER — LACTATED RINGERS IV SOLN: Freq: Once | INTRAVENOUS | Status: DC

## 2022-09-11 MED ORDER — DEXAMETHASONE SODIUM PHOSPHATE 10 MG/ML IJ SOLN
INTRAMUSCULAR | Status: AC
  Filled 2022-09-11: qty 2

## 2022-09-11 MED ORDER — DEXAMETHASONE SODIUM PHOSPHATE 10 MG/ML IJ SOLN
10.0000 mg | Freq: Once | INTRAMUSCULAR | Status: AC
  Administered 2022-09-11: 10 mg

## 2022-09-11 MED ORDER — MIDAZOLAM HCL 2 MG/2ML IJ SOLN
INTRAMUSCULAR | Status: AC
  Filled 2022-09-11: qty 2

## 2022-09-11 MED ORDER — LIDOCAINE HCL (PF) 2 % IJ SOLN
INTRAMUSCULAR | Status: AC
  Filled 2022-09-11: qty 10

## 2022-09-11 MED ORDER — LIDOCAINE HCL 2 % IJ SOLN
20.0000 mL | Freq: Once | INTRAMUSCULAR | Status: AC
  Administered 2022-09-11: 200 mg

## 2022-09-11 MED ORDER — ROPIVACAINE HCL 2 MG/ML IJ SOLN
INTRAMUSCULAR | Status: AC
  Filled 2022-09-11: qty 20

## 2022-09-11 MED ORDER — ROPIVACAINE HCL 2 MG/ML IJ SOLN
9.0000 mL | Freq: Once | INTRAMUSCULAR | Status: AC
  Administered 2022-09-11: 9 mL via PERINEURAL

## 2022-09-11 MED ORDER — MIDAZOLAM HCL 2 MG/2ML IJ SOLN: 0.5000 mg | Freq: Once | INTRAMUSCULAR | Status: DC

## 2022-09-11 NOTE — Progress Notes (Signed)
PROVIDER NOTE: Interpretation of information contained herein should be left to medically-trained personnel. Specific patient instructions are provided elsewhere under "Patient Instructions" section of medical record. This document was created in part using STT-dictation technology, any transcriptional errors that may result from this process are unintentional.  Patient: Marilyn Davis Type: Established DOB: 02-08-1956 MRN: [id number] PCP: [facility]  Service: Procedure DOS: 09/11/2022 Setting: Ambulatory Location: Ambulatory outpatient facility Delivery: Face-to-face Provider: Edward Jolly, MD Specialty: Interventional Pain Management Specialty designation: 09 Location: Outpatient facility Ref. Prov.: Edward Jolly, MD       Interventional Therapy   Procedure: Lumbar Facet, Medial Branch Block(s) #1  Laterality: Bilateral  Level: L3, L4, and L5 Medial Branch Level(s). Injecting these levels blocks the L3-4 and L4-5 lumbar facet joints. Imaging: Fluoroscopic guidance         Anesthesia: Local anesthesia (1-2% Lidocaine) Anxiolysis: IV Versed 2 mg  DOS: 09/11/2022 Performed by: Edward Jolly, MD  Primary Purpose: Diagnostic/Therapeutic Indications: Low back pain severe enough to impact quality of life or function. 1. Lumbar facet arthropathy   2. Lumbar spondylosis   3. Chronic pain syndrome    NAS-11 Pain score:   Pre-procedure: 5 /10   Post-procedure: 2 /10     Position / Prep / Materials:  Position: Prone  Prep solution: DuraPrep (Iodine Povacrylex [0.7% available iodine] and Isopropyl Alcohol, 74% w/w) Area Prepped: Posterolateral Lumbosacral Spine (Wide prep: From the lower border of the scapula down to the end of the tailbone and from flank to flank.)  Materials:  Tray: Block Needle(s):  Type: Spinal  Gauge (G): 22  Length: 5-in Qty: 2      H&P (Pre-op Assessment):  Marilyn Davis is a 67 y.o. (year old), female patient, seen today for  interventional treatment. She  has a past surgical history that includes Tonsillectomy; Abdominal hysterectomy; Exploratory laparotomy; Shoulder surgery; Breast biopsy (Right, 09/30/2019); and Breast biopsy (Right, 09/30/2019). Marilyn Davis has a current medication list which includes the following prescription(s): alendronate, atorvastatin, b-d uf iii mini pen needles, benztropine, bupropion, fluticasone, gabapentin, lamotrigine, lamotrigine, linaclotide, mirtazapine, montelukast, oxycodone-acetaminophen, pantoprazole, propranolol, quetiapine, rybelsus, sumatriptan, and trazodone, and the following Facility-Administered Medications: lactated ringers and midazolam. Her primarily concern today is the Back Pain  Initial Vital Signs:  Pulse/HCG Rate: 77ECG Heart Rate: 76 Temp: (!) 97 F (36.1 C) Resp: 16 BP: 104/61 SpO2: 99 %  BMI: Estimated body mass index is 33.59 kg/m as calculated from the following:   Height as of this encounter: 5' 5.5" (1.664 m).   Weight as of this encounter: 205 lb (93 kg).  Risk Assessment: Allergies: Reviewed. She is allergic to sumatriptan, pseudoephedrine hcl, cabbage, and ibuprofen.  Allergy Precautions: None required Coagulopathies: Reviewed. None identified.  Blood-thinner therapy: None at this time Active Infection(s): Reviewed. None identified. Marilyn Davis is afebrile  Site Confirmation: Marilyn Davis was asked to confirm the procedure and laterality before marking the site Procedure checklist: Completed Consent: Before the procedure and under the influence of no sedative(s), amnesic(s), or anxiolytics, the patient was informed of the treatment options, risks and possible complications. To fulfill our ethical and legal obligations, as recommended by the American Medical Association's Code of Ethics, I have informed the patient of my clinical impression; the nature and purpose of the treatment or procedure; the risks, benefits, and possible complications of  the intervention; the alternatives, including doing nothing; the risk(s) and benefit(s) of the alternative treatment(s) or procedure(s); and the risk(s) and benefit(s) of doing nothing. The  patient was provided information about the general risks and possible complications associated with the procedure. These may include, but are not limited to: failure to achieve desired goals, infection, bleeding, organ or nerve damage, allergic reactions, paralysis, and death. In addition, the patient was informed of those risks and complications associated to Spine-related procedures, such as failure to decrease pain; infection (i.e.: Meningitis, epidural or intraspinal abscess); bleeding (i.e.: epidural hematoma, subarachnoid hemorrhage, or any other type of intraspinal or peri-dural bleeding); organ or nerve damage (i.e.: Any type of peripheral nerve, nerve root, or spinal cord injury) with subsequent damage to sensory, motor, and/or autonomic systems, resulting in permanent pain, numbness, and/or weakness of one or several areas of the body; allergic reactions; (i.e.: anaphylactic reaction); and/or death. Furthermore, the patient was informed of those risks and complications associated with the medications. These include, but are not limited to: allergic reactions (i.e.: anaphylactic or anaphylactoid reaction(s)); adrenal axis suppression; blood sugar elevation that in diabetics may result in ketoacidosis or comma; water retention that in patients with history of congestive heart failure may result in shortness of breath, pulmonary edema, and decompensation with resultant heart failure; weight gain; swelling or edema; medication-induced neural toxicity; particulate matter embolism and blood vessel occlusion with resultant organ, and/or nervous system infarction; and/or aseptic necrosis of one or more joints. Finally, the patient was informed that Medicine is not an exact science; therefore, there is also the possibility  of unforeseen or unpredictable risks and/or possible complications that may result in a catastrophic outcome. The patient indicated having understood very clearly. We have given the patient no guarantees and we have made no promises. Enough time was given to the patient to ask questions, all of which were answered to the patient's satisfaction. Ms. Dooney has indicated that she wanted to continue with the procedure. Attestation: I, the ordering provider, attest that I have discussed with the patient the benefits, risks, side-effects, alternatives, likelihood of achieving goals, and potential problems during recovery for the procedure that I have provided informed consent. Date  Time: 09/11/2022 12:49 PM   Pre-Procedure Preparation:  Monitoring: As per clinic protocol. Respiration, ETCO2, SpO2, BP, heart rate and rhythm monitor placed and checked for adequate function Safety Precautions: Patient was assessed for positional comfort and pressure points before starting the procedure. Time-out: I initiated and conducted the "Time-out" before starting the procedure, as per protocol. The patient was asked to participate by confirming the accuracy of the "Time Out" information. Verification of the correct person, site, and procedure were performed and confirmed by me, the nursing staff, and the patient. "Time-out" conducted as per Joint Commission's Universal Protocol (UP.01.01.01). Time: 1311 Start Time: 1311 hrs.  Description of Procedure:          Laterality: (see above) Targeted Levels: (see above)  Safety Precautions: Aspiration looking for blood return was conducted prior to all injections. At no point did we inject any substances, as a needle was being advanced. Before injecting, the patient was told to immediately notify me if she was experiencing any new onset of "ringing in the ears, or metallic taste in the mouth". No attempts were made at seeking any paresthesias. Safe injection practices and  needle disposal techniques used. Medications properly checked for expiration dates. SDV (single dose vial) medications used. After the completion of the procedure, all disposable equipment used was discarded in the proper designated medical waste containers. Local Anesthesia: Protocol guidelines were followed. The patient was positioned over the fluoroscopy table. The area was prepped  in the usual manner. The time-out was completed. The target area was identified using fluoroscopy. A 12-in long, straight, sterile hemostat was used with fluoroscopic guidance to locate the targets for each level blocked. Once located, the skin was marked with an approved surgical skin marker. Once all sites were marked, the skin (epidermis, dermis, and hypodermis), as well as deeper tissues (fat, connective tissue and muscle) were infiltrated with a small amount of a short-acting local anesthetic, loaded on a 10cc syringe with a 25G, 1.5-in  Needle. An appropriate amount of time was allowed for local anesthetics to take effect before proceeding to the next step. Local Anesthetic: Lidocaine 2.0% The unused portion of the local anesthetic was discarded in the proper designated containers. Technical description of process:   L3 Medial Branch Nerve Block (MBB): The target area for the L3 medial branch is at the junction of the postero-lateral aspect of the superior articular process and the superior, posterior, and medial edge of the transverse process of L4. Under fluoroscopic guidance, a Quincke needle was inserted until contact was made with os over the superior postero-lateral aspect of the pedicular shadow (target area). After negative aspiration for blood, 2mL of the nerve block solution was injected without difficulty or complication. The needle was removed intact. L4 Medial Branch Nerve Block (MBB): The target area for the L4 medial branch is at the junction of the postero-lateral aspect of the superior articular process and  the superior, posterior, and medial edge of the transverse process of L5. Under fluoroscopic guidance, a Quincke needle was inserted until contact was made with os over the superior postero-lateral aspect of the pedicular shadow (target area). After negative aspiration for blood, 2mL of the nerve block solution was injected without difficulty or complication. The needle was removed intact. L5 Medial Branch Nerve Block (MBB): The target area for the L5 medial branch is at the junction of the postero-lateral aspect of the superior articular process and the superior, posterior, and medial edge of the sacral ala. Under fluoroscopic guidance, a Quincke needle was inserted until contact was made with os over the superior postero-lateral aspect of the pedicular shadow (target area). After negative aspiration for blood, 2mL of the nerve block solution was injected without difficulty or complication. The needle was removed intact.   Once the entire procedure was completed, the treated area was cleaned, making sure to leave some of the prepping solution back to take advantage of its long term bactericidal properties.         Illustration of the posterior view of the lumbar spine and the posterior neural structures. Laminae of L2 through S1 are labeled. DPRL5, dorsal primary ramus of L5; DPRS1, dorsal primary ramus of S1; DPR3, dorsal primary ramus of L3; FJ, facet (zygapophyseal) joint L3-L4; I, inferior articular process of L4; LB1, lateral branch of dorsal primary ramus of L1; IAB, inferior articular branches from L3 medial branch (supplies L4-L5 facet joint); IBP, intermediate branch plexus; MB3, medial branch of dorsal primary ramus of L3; NR3, third lumbar nerve root; S, superior articular process of L5; SAB, superior articular branches from L4 (supplies L4-5 facet joint also); TP3, transverse process of L3.   Facet Joint Innervation (* possible contribution)  L1-2 T12, L1 (L2*)  Medial Branch  L2-3 L1, L2  (L3*)         "          "  L3-4 L2, L3 (L4*)         "          "  L4-5 L3, L4 (L5*)         "          "  L5-S1 L4, L5, S1          "          "    Vitals:   09/11/22 1314 09/11/22 1319 09/11/22 1321 09/11/22 1326  BP: (!) 140/80 115/63 133/67 (!) 102/50  Pulse:      Resp: 15 15 16 16   Temp:    (!) 97 F (36.1 C)  SpO2: 91% 96% 97% 94%  Weight:      Height:         End Time:   hrs.  Imaging Guidance (Spinal):          Type of Imaging Technique: Fluoroscopy Guidance (Spinal) Indication(s): Assistance in needle guidance and placement for procedures requiring needle placement in or near specific anatomical locations not easily accessible without such assistance. Exposure Time: Please see nurses notes. Contrast: None used. Fluoroscopic Guidance: I was personally present during the use of fluoroscopy. "Tunnel Vision Technique" used to obtain the best possible view of the target area. Parallax error corrected before commencing the procedure. "Direction-depth-direction" technique used to introduce the needle under continuous pulsed fluoroscopy. Once target was reached, antero-posterior, oblique, and lateral fluoroscopic projection used confirm needle placement in all planes. Images permanently stored in EMR. Interpretation: No contrast injected. I personally interpreted the imaging intraoperatively. Adequate needle placement confirmed in multiple planes. Permanent images saved into the patient's record.  Post-operative Assessment:  Post-procedure Vital Signs:  Pulse/HCG Rate: 7777 Temp: (!) 97 F (36.1 C) Resp: 16 BP: (!) 102/50 SpO2: 94 %  EBL: None  Complications: No immediate post-treatment complications observed by team, or reported by patient.  Note: The patient tolerated the entire procedure well. A repeat set of vitals were taken after the procedure and the patient was kept under observation following institutional policy, for this type of procedure. Post-procedural  neurological assessment was performed, showing return to baseline, prior to discharge. The patient was provided with post-procedure discharge instructions, including a section on how to identify potential problems. Should any problems arise concerning this procedure, the patient was given instructions to immediately contact us, at any time, without hesitation. In any case, we plan to contact the patient by telephone for a follow-up status report regarding this interventional procedure.  Comments:  No additional relevant information.  Plan of Care (POC)  Orders:  Orders Placed This Encounter  Procedures   DG PAIN [facility] C-ARM 1-60 MIN NO REPORT    Intraoperative interpretation by procedural physician at [facility].    Standing Status:   Standing    Number of Occurrences:   1    Order Specific Question:   Reason for exam:    Answer:   Assistance in needle guidance and placement for procedures requiring needle placement in or near specific anatomical locations not easily accessible without such assistance.    Medications ordered for procedure: Meds ordered this encounter  Medications   lidocaine (XYLOCAINE) 2 % (with pres) injection 400 mg   lactated ringers infusion   midazolam (VERSED) injection 0.5-2 mg    Make sure Flumazenil is available in the pyxis when using this medication. If oversedation occurs, administer 0.2 mg IV over 15 sec. If after 45 sec no response, administer 0.2 mg again over 1 min; may repeat at 1 min intervals; not to exceed 4 doses (1 mg)   dexamethasone (DECADRON) injection 10  mg   dexamethasone (DECADRON) injection 10 mg   ropivacaine (PF) 2 mg/mL (0.2%) (NAROPIN) injection 9 mL   ropivacaine (PF) 2 mg/mL (0.2%) (NAROPIN) injection 9 mL   Medications administered: We administered lidocaine, dexamethasone, dexamethasone, ropivacaine (PF) 2 mg/mL (0.2%), and ropivacaine (PF) 2 mg/mL (0.2%).  See the medical record for exact dosing, route, and time of  administration.  Follow-up plan:   Return in about 3 weeks (around 10/02/2022) for Post Procedure Evaluation, in person.       B/L L3,4,5 MBNB #1 09/11/22    Recent Visits Date Type Provider Dept  08/20/22 Office Visit Edward Jolly, MD [facility]  Showing recent visits within past 90 days and meeting all other requirements Today's Visits Date Type Provider Dept  09/11/22 Procedure visit Edward Jolly, MD [facility]  Showing today's visits and meeting all other requirements Future Appointments Date Type Provider Dept  10/01/22 Appointment Edward Jolly, MD [facility]  Showing future appointments within next 90 days and meeting all other requirements  Disposition: Discharge home  Discharge (Date  Time): 09/11/2022; 1227 hrs.   Primary Care Physician: [facility] Location: [facility] Note by: Edward Jolly, MD (TTS technology used. I apologize for any typographical errors that were not detected and corrected.) Date: 09/11/2022; Time: 1:41 PM  Disclaimer:  Medicine is not an Visual merchandiser. The only guarantee in medicine is that nothing is guaranteed. It is important to note that the decision to proceed with this intervention was based on the information collected from the patient. The Data and conclusions were drawn from the patient's questionnaire, the interview, and the physical examination. Because the information was provided in large part by the patient, it cannot be guaranteed that it has not been purposely or unconsciously manipulated. Every effort has been made to obtain as much relevant data as possible for this evaluation. It is important to note that the conclusions that lead to this procedure are derived in large part from the available data. Always take into account that the treatment will also be dependent on availability of resources and existing treatment guidelines, considered by other Pain  Management Practitioners as being common knowledge and practice, at the time of the intervention. For Medico-Legal purposes, it is also important to point out that variation in procedural techniques and pharmacological choices are the acceptable norm. The indications, contraindications, technique, and results of the above procedure should only be interpreted and judged by a Board-Certified Interventional Pain Specialist with extensive familiarity and expertise in the same exact procedure and technique.

## 2022-09-11 NOTE — Patient Instructions (Signed)

## 2022-09-12 ENCOUNTER — Telehealth: Payer: Self-pay | Admitting: *Deleted

## 2022-09-12 NOTE — Telephone Encounter (Signed)
No problems post procedure. 

## 2022-09-18 ENCOUNTER — Telehealth: Payer: Self-pay | Admitting: Psychiatry

## 2022-09-18 DIAGNOSIS — F3174 Bipolar disorder, in full remission, most recent episode manic: Secondary | ICD-10-CM

## 2022-09-18 DIAGNOSIS — F603 Borderline personality disorder: Secondary | ICD-10-CM

## 2022-09-18 MED ORDER — QUETIAPINE FUMARATE 25 MG PO TABS
175.0000 mg | ORAL_TABLET | ORAL | Status: DC
Start: 2022-09-18 — End: 2022-10-24

## 2022-09-18 NOTE — Telephone Encounter (Signed)
Patient recently took a steroid and pain blocker for her foot. Patient stated she is having a hard time sleeping and is feeling restless. Patient d stated sleeping medication Wellbutrin and Trazodone is being compromised by her steroid medication. Patient stated she would like to know what she should do to help her sleep.-Please advise

## 2022-09-18 NOTE — Telephone Encounter (Signed)
Contacted patient ,patient reports she is currently not sleeping at all since the past few nights.  She got a steroid injection for her pain recently and she wonders if that is causing her sleep issues.  She is also in pain and she is planning to follow up with her provider to manage that.  Patient agreeable to dosage increase of Seroquel 150 mg at bedtime.  She will continue the Seroquel 25 mg during the day.  Patient advised to monitor and work on sleep hygiene and reach out to writer in a week from now if she does not notice a change.

## 2022-09-25 ENCOUNTER — Other Ambulatory Visit: Payer: Self-pay | Admitting: Internal Medicine

## 2022-09-25 ENCOUNTER — Other Ambulatory Visit: Payer: Self-pay | Admitting: Family

## 2022-10-01 ENCOUNTER — Ambulatory Visit: Payer: Medicare HMO | Admitting: Student in an Organized Health Care Education/Training Program

## 2022-10-03 ENCOUNTER — Encounter: Payer: Self-pay | Admitting: Student in an Organized Health Care Education/Training Program

## 2022-10-03 ENCOUNTER — Ambulatory Visit
Payer: Medicare HMO | Attending: Student in an Organized Health Care Education/Training Program | Admitting: Student in an Organized Health Care Education/Training Program

## 2022-10-03 VITALS — BP 106/86 | HR 91 | Temp 96.6°F | Resp 16 | Ht 65.0 in | Wt 203.0 lb

## 2022-10-03 DIAGNOSIS — G8929 Other chronic pain: Secondary | ICD-10-CM | POA: Diagnosis present

## 2022-10-03 DIAGNOSIS — M47816 Spondylosis without myelopathy or radiculopathy, lumbar region: Secondary | ICD-10-CM | POA: Insufficient documentation

## 2022-10-03 DIAGNOSIS — M5416 Radiculopathy, lumbar region: Secondary | ICD-10-CM

## 2022-10-03 DIAGNOSIS — G894 Chronic pain syndrome: Secondary | ICD-10-CM

## 2022-10-03 NOTE — Progress Notes (Signed)
Safety precautions to be maintained throughout the outpatient stay will include: orient to surroundings, keep bed in low position, maintain call bell within reach at all times, provide assistance with transfer out of bed and ambulation.  

## 2022-10-03 NOTE — Progress Notes (Signed)
PROVIDER NOTE: Information contained herein reflects review and annotations entered in association with encounter. Interpretation of such information and data should be left to medically-trained personnel. Information provided to patient can be located elsewhere in the medical record under "Patient Instructions". Document created using STT-dictation technology, any transcriptional errors that may result from process are unintentional.    Patient: Marilyn Davis  Service Category: E/M  Provider: Edward Jolly, MD  DOB: 1955-05-09  DOS: 10/03/2022  Referring Provider: [facility]  MRN: [id number]  Specialty: Interventional Pain Management  PCP: [facility]  Type: Established Patient  Setting: Ambulatory outpatient    Location: Office  Delivery: Face-to-face     HPI  [name], a 67 y.o. year old female, is here today because of her Chronic radicular lumbar pain [M54.16, G89.29]. Marilyn Davis's primary complain today is Back Pain (right) and Hip Pain (right)  Pain Assessment: Severity of Chronic pain is reported as a 4 /10. Location: Back Lower, Right/into right hip and down to the knee, back of leg. Onset: More than a month ago. Quality: Stabbing, Constant, Aching, Discomfort. Timing: Constant. Modifying factor(s): BLF helped on left side and a little on the right.  laying flat on her back. Vitals:  height is 5\' 5"  (1.651 m) and weight is 203 lb (92.1 kg). Her temporal temperature is 96.6 F (35.9 C) (abnormal). Her blood pressure is 106/86 and her pulse is 91. Her respiration is 16 and oxygen saturation is 92%.  BMI: Estimated body mass index is 33.78 kg/m as calculated from the following:   Height as of this encounter: 5\' 5"  (1.651 m).   Weight as of this encounter: 203 lb (92.1 kg). Last encounter: 08/20/2022. Last procedure: 09/11/2022.  Reason for encounter: post-procedure evaluation and assessment.      Post-procedure evaluation    Procedure: Lumbar Facet, Medial Branch Block(s) #1  Laterality: Bilateral  Level: L3, L4, and L5 Medial Branch Level(s). Injecting these levels blocks the L3-4 and L4-5 lumbar facet joints. Imaging: Fluoroscopic guidance         Anesthesia: Local anesthesia (1-2% Lidocaine) Anxiolysis: IV Versed 2 mg  DOS: 09/11/2022 Performed by: Edward Jolly, MD  Primary Purpose: Diagnostic/Therapeutic Indications: Low back pain severe enough to impact quality of life or function. 1. Lumbar facet arthropathy   2. Lumbar spondylosis   3. Chronic pain syndrome    NAS-11 Pain score:   Pre-procedure: 5 /10   Post-procedure: 2 /10      Effectiveness:  Initial hour after procedure: 100 % (left side is improved 100%.  right side is improved, aching is gone but continues to have stabbing pain.)  Subsequent 4-6 hours post-procedure: 100 %  Analgesia past initial 6 hours: 100 % (see note under short term relief)  Ongoing improvement:  Analgesic:  100% for low back pain Function: Somewhat improved ROM: Somewhat improved  ROS  Constitutional: Denies any fever or chills Gastrointestinal: No reported hemesis, hematochezia, vomiting, or acute GI distress Musculoskeletal:  radiating right leg pain Neurological: No reported episodes of acute onset apraxia, aphasia, dysarthria, agnosia, amnesia, paralysis, loss of coordination, or loss of consciousness  Medication Review  Insulin Pen Needle, QUEtiapine, SUMAtriptan, Semaglutide, Ubrogepant, alendronate, atorvastatin, benztropine, buPROPion, fenofibrate micronized, fluticasone, gabapentin, lamoTRIgine, linaclotide, meloxicam, mirtazapine, montelukast, oxyCODONE-acetaminophen, pantoprazole, propranolol, traMADol, and traZODone  History Review  Allergy: Marilyn Davis is allergic to sumatriptan, pseudoephedrine hcl, cabbage, and ibuprofen. Drug: Marilyn Davis  reports no history of drug use. Alcohol:  reports no history  of alcohol use. Tobacco:  reports that  she has never smoked. She has never used smokeless tobacco. Social: Marilyn Davis  reports that she has never smoked. She has never used smokeless tobacco. She reports that she does not drink alcohol and does not use drugs. Medical:  has a past medical history of Bipolar disorder (HCC), Diabetes mellitus without complication (HCC), and Sciatica. Surgical: Marilyn Davis  has a past surgical history that includes Tonsillectomy; Abdominal hysterectomy; Exploratory laparotomy; Shoulder surgery; Breast biopsy (Right, 09/30/2019); and Breast biopsy (Right, 09/30/2019). Family: family history includes Alcohol abuse in her brother; Bipolar disorder in her father; Drug abuse in her brother; Personality disorder in her mother; Suicidality in an other family member.  Laboratory Chemistry Profile   Renal Lab Results  Component Value Date   BUN 18 07/29/2021   CREATININE 1.06 (H) 07/29/2021   GFRAA 51 (L) 11/11/2019   GFRNONAA 58 (L) 07/29/2021    Hepatic Lab Results  Component Value Date   AST 22 07/29/2021   ALT 26 07/29/2021   ALBUMIN 4.3 07/29/2021   ALKPHOS 26 (L) 07/29/2021   LIPASE 70 (H) 07/29/2021    Electrolytes Lab Results  Component Value Date   NA 139 07/29/2021   K 4.2 07/29/2021   CL 111 07/29/2021   CALCIUM 9.8 07/29/2021    Bone No results found for: "VD25OH", "VD125OH2TOT", "HQ4696EX5", "MW4132GM0", "25OHVITD1", "25OHVITD2", "25OHVITD3", "TESTOFREE", "TESTOSTERONE"  Inflammation (CRP: Acute Phase) (ESR: Chronic Phase) No results found for: "CRP", "ESRSEDRATE", "LATICACIDVEN"       Note: Above Lab results reviewed.  Recent Imaging Review  DG PAIN [facility] C-ARM 1-60 MIN NO REPORT Fluoro was used, but no Radiologist interpretation will be provided.  Please refer to "NOTES" tab for provider progress note. Note: Reviewed        Physical Exam  General appearance: Well nourished, well developed, and well hydrated. In no apparent acute distress Mental status: Alert,  oriented x 3 (person, place, & time)       Respiratory: No evidence of acute respiratory distress Eyes: PERLA Vitals: BP 106/86 (BP Location: Right Arm, Patient Position: Sitting, Cuff Size: Large)   Pulse 91   Temp (!) 96.6 F (35.9 C) (Temporal)   Resp 16   Ht 5\' 5"  (1.651 m)   Wt 203 lb (92.1 kg)   SpO2 92%   BMI 33.78 kg/m  BMI: Estimated body mass index is 33.78 kg/m as calculated from the following:   Height as of this encounter: 5\' 5"  (1.651 m).   Weight as of this encounter: 203 lb (92.1 kg). Ideal: Ideal body weight: 57 kg (125 lb 10.6 oz) Adjusted ideal body weight: 71 kg (156 lb 9.6 oz)  Lumbar Spine Area Exam  Skin & Axial Inspection: No masses, redness, or swelling Alignment: Symmetrical Functional ROM: Pain restricted ROM affecting primarily the right Stability: No instability detected Muscle Tone/Strength: Functionally intact. No obvious neuro-muscular anomalies detected. Sensory (Neurological): Dermatomal pain pattern right l3 and l4 Palpation: No palpable anomalies       Provocative Tests: Hyperextension/rotation test: deferred today       Lumbar quadrant test (Kemp's test): (+) on the right for foraminal stenosis Lateral bending test: (+) ipsilateral radicular pain, on the right. Positive for right-sided foraminal stenosis.  Gait & Posture Assessment  Ambulation: Unassisted Gait: Relatively normal for age and body habitus Posture: WNL  Lower Extremity Exam    Side: Right lower extremity  Side: Left lower extremity  Stability: No instability observed  Stability: No instability observed          Skin & Extremity Inspection: Skin color, temperature, and hair growth are WNL. No peripheral edema or cyanosis. No masses, redness, swelling, asymmetry, or associated skin lesions. No contractures.  Skin & Extremity Inspection: Skin color, temperature, and hair growth are WNL. No peripheral edema or cyanosis. No masses, redness, swelling, asymmetry, or  associated skin lesions. No contractures.  Functional ROM: Pain restricted ROM for hip and knee joints          Functional ROM: Unrestricted ROM                  Muscle Tone/Strength: Functionally intact. No obvious neuro-muscular anomalies detected.  Muscle Tone/Strength: Functionally intact. No obvious neuro-muscular anomalies detected.  Sensory (Neurological): Unimpaired        Sensory (Neurological): Unimpaired        DTR: Patellar: deferred today Achilles: deferred today Plantar: deferred today  DTR: Patellar: deferred today Achilles: deferred today Plantar: deferred today  Palpation: No palpable anomalies  Palpation: No palpable anomalies    Assessment   Diagnosis Status  1. Chronic radicular lumbar pain   2. Lumbar facet arthropathy   3. Chronic pain syndrome    Having a Flare-up Controlled Controlled   Updated Problems: Problem  Chronic Radicular Lumbar Pain  Lumbar Facet Arthropathy    Plan of Care  Patient presents today for postprocedural evaluation after first set of diagnostic lumbar facet medial branch nerve blocks that provided her with greater than 75% pain relief in regards to her axial low back pain.  She states that she is doing better from a low back pain standpoint but she is having increased radiating right hip right buttock pain that radiates down her posterior lateral thigh stopping usually above her right knee.  We discussed a right lumbar epidural steroid injection for her lumbar radicular pain symptoms.  Risks and benefits reviewed and patient would like to proceed.  Regards to her low back pain related to lumbar facet arthropathy should that return, we discussed repeating diagnostic lumbar facet medial branch nerve block #2 and then considering lumbar RFA down the line.   Orders:  Orders Placed This Encounter  Procedures   Lumbar Transforaminal Epidural    Standing Status:   Future    Standing Expiration Date:   01/03/2023    Scheduling  Instructions:     Laterality: Right L3 and L5         Sedation: Patient's choice.     Timeframe: ASAP    Order Specific Question:   Where will this procedure be performed?    Answer:   [facility]   Follow-up plan:   Return in about 20 days (around 10/23/2022) for Right L3 and L5 TF ESI.      B/L L3,4,5 MBNB #1 09/11/22     Recent Visits Date Type Provider Dept  09/11/22 Procedure visit Edward Jolly, MD [facility]  08/20/22 Office Visit Edward Jolly, MD [facility]  Showing recent visits within past 90 days and meeting all other requirements Today's Visits Date Type Provider Dept  10/03/22 Office Visit Edward Jolly, MD [facility]  Showing today's visits and meeting all other requirements Future Appointments No visits were found meeting these conditions. Showing future appointments within next 90 days and meeting all other requirements  I discussed the assessment and treatment plan with the patient. The patient was provided an opportunity to ask questions and all were answered.  The patient agreed with the plan and demonstrated an understanding of the instructions.  Patient advised to call back or seek an in-person evaluation if the symptoms or condition worsens.  Duration of encounter: .  Total time on encounter, as per AMA guidelines included both the face-to-face and non-face-to-face time personally spent by the physician and/or other qualified health care professional(s) on the day of the encounter (includes time in activities that require the physician or other qualified health care professional and does not include time in activities normally performed by clinical staff). Physician's time may include the following activities when performed: Preparing to see the patient (e.g., pre-charting review of records, searching for previously ordered imaging, lab work, and nerve conduction tests) Review of prior analgesic  pharmacotherapies. Reviewing PMP Interpreting ordered tests (e.g., lab work, imaging, nerve conduction tests) Performing post-procedure evaluations, including interpretation of diagnostic procedures Obtaining and/or reviewing separately obtained history Performing a medically appropriate examination and/or evaluation Counseling and educating the patient/family/caregiver Ordering medications, tests, or procedures Referring and communicating with other health care professionals (when not separately reported) Documenting clinical information in the electronic or other health record Independently interpreting results (not separately reported) and communicating results to the patient/ family/caregiver Care coordination (not separately reported)  Note by: Edward Jolly, MD Date: 10/03/2022; Time: 2:10 PM

## 2022-10-03 NOTE — Patient Instructions (Signed)
____________________________________________________________________________________________  General Risks and Possible Complications  Patient Responsibilities: It is important that you read this as it is part of your informed consent. It is our duty to inform you of the risks and possible complications associated with treatments offered to you. It is your responsibility as a patient to read this and to ask questions about anything that is not clear or that you believe was not covered in this document.  Patient's Rights: You have the right to refuse treatment. You also have the right to change your mind, even after initially having agreed to have the treatment done. However, under this last option, if you wait until the last second to change your mind, you may be charged for the materials used up to that point.  Introduction: Medicine is not an exact science. Everything in Medicine, including the lack of treatment(s), carries the potential for danger, harm, or loss (which is by definition: Risk). In Medicine, a complication is a secondary problem, condition, or disease that can aggravate an already existing one. All treatments carry the risk of possible complications. The fact that a side effects or complications occurs, does not imply that the treatment was conducted incorrectly. It must be clearly understood that these can happen even when everything is done following the highest safety standards.  No treatment: You can choose not to proceed with the proposed treatment alternative. The "PRO(s)" would include: avoiding the risk of complications associated with the therapy. The "CON(s)" would include: not getting any of the treatment benefits. These benefits fall under one of three categories: diagnostic; therapeutic; and/or palliative. Diagnostic benefits include: getting information which can ultimately lead to improvement of the disease or symptom(s). Therapeutic benefits are those associated with  the successful treatment of the disease. Finally, palliative benefits are those related to the decrease of the primary symptoms, without necessarily curing the condition (example: decreasing the pain from a flare-up of a chronic condition, such as incurable terminal cancer).  General Risks and Complications: These are associated to most interventional treatments. They can occur alone, or in combination. They fall under one of the following six (6) categories: no benefit or worsening of symptoms; bleeding; infection; nerve damage; allergic reactions; and/or death. No benefits or worsening of symptoms: In Medicine there are no guarantees, only probabilities. No healthcare provider can ever guarantee that a medical treatment will work, they can only state the probability that it may. Furthermore, there is always the possibility that the condition may worsen, either directly, or indirectly, as a consequence of the treatment. Bleeding: This is more common if the patient is taking a blood thinner, either prescription or over the counter (example: Goody Powders, Fish oil, Aspirin, Garlic, etc.), or if suffering a condition associated with impaired coagulation (example: Hemophilia, cirrhosis of the liver, low platelet counts, etc.). However, even if you do not have one on these, it can still happen. If you have any of these conditions, or take one of these drugs, make sure to notify your treating physician. Infection: This is more common in patients with a compromised immune system, either due to disease (example: diabetes, cancer, human immunodeficiency virus [HIV], etc.), or due to medications or treatments (example: therapies used to treat cancer and rheumatological diseases). However, even if you do not have one on these, it can still happen. If you have any of these conditions, or take one of these drugs, make sure to notify your treating physician. Nerve Damage: This is more common when the treatment is an    invasive one, but it can also happen with the use of medications, such as those used in the treatment of cancer. The damage can occur to small secondary nerves, or to large primary ones, such as those in the spinal cord and brain. This damage may be temporary or permanent and it may lead to impairments that can range from temporary numbness to permanent paralysis and/or brain death. Allergic Reactions: Any time a substance or material comes in contact with our body, there is the possibility of an allergic reaction. These can range from a mild skin rash (contact dermatitis) to a severe systemic reaction (anaphylactic reaction), which can result in death. Death: In general, any medical intervention can result in death, most of the time due to an unforeseen complication. ____________________________________________________________________________________________    ______________________________________________________________________  Preparing for your procedure  Appointments: If you think you may not be able to keep your appointment, call 24-48 hours in advance to cancel. We need time to make it available to others.  During your procedure appointment there will be: No Prescription Refills. No disability issues to discussed. No medication changes or discussions.  Instructions: Food intake: Avoid eating anything solid for at least 8 hours prior to your procedure. Clear liquid intake: You may take clear liquids such as water up to 2 hours prior to your procedure. (No carbonated drinks. No soda.) Transportation: Unless otherwise stated by your physician, bring a driver. Morning Medicines: Except for blood thinners, take all of your other morning medications with a sip of water. Make sure to take your heart and blood pressure medicines. If your blood pressure's lower number is above 100, the case will be rescheduled. Blood thinners: Make sure to stop your blood thinners as instructed.  If you take  a blood thinner, but were not instructed to stop it, call our office [phone] and ask to talk to a nurse. Not stopping a blood thinner prior to certain procedures could lead to serious complications. Diabetics on insulin: Notify the staff so that you can be scheduled 1st case in the morning. If your diabetes requires high dose insulin, take only  of your normal insulin dose the morning of the procedure and notify the staff that you have done so. Preventing infections: Shower with an antibacterial soap the morning of your procedure.  Build-up your immune system: Take 1000 mg of Vitamin C with every meal (3 times a day) the day prior to your procedure. Antibiotics: Inform the nursing staff if you are taking any antibiotics or if you have any conditions that may require antibiotics prior to procedures. (Example: recent joint implants)   Pregnancy: If you are pregnant make sure to notify the nursing staff. Not doing so may result in injury to the fetus, including death.  Sickness: If you have a cold, fever, or any active infections, call and cancel or reschedule your procedure. Receiving steroids while having an infection may result in complications. Arrival: You must be in the facility at least 30 minutes prior to your scheduled procedure. Tardiness: Your scheduled time is also the cutoff time. If you do not arrive at least 15 minutes prior to your procedure, you will be rescheduled.  Children: Do not bring any children with you. Make arrangements to keep them home. Dress appropriately: There is always a possibility that your clothing may get soiled. Avoid long dresses. Valuables: Do not bring any jewelry or valuables.  Reasons to call and reschedule or cancel your procedure: (Following these recommendations will minimize the risk   of a serious complication.) Surgeries: Avoid having procedures within 2 weeks of any surgery. (Avoid for 2 weeks before or after any surgery). Flu Shots: Avoid having  procedures within 2 weeks of a flu shots or . (Avoid for 2 weeks before or after immunizations). Barium: Avoid having a procedure within 7-10 days after having had a radiological study involving the use of radiological contrast. (Myelograms, Barium swallow or enema study). Heart attacks: Avoid any elective procedures or surgeries for the initial 6 months after a "Myocardial Infarction" (Heart Attack). Blood thinners: It is imperative that you stop these medications before procedures. Let us know if you if you take any blood thinner.  Infection: Avoid procedures during or within two weeks of an infection (including chest colds or gastrointestinal problems). Symptoms associated with infections include: Localized redness, fever, chills, night sweats or profuse sweating, burning sensation when voiding, cough, congestion, stuffiness, runny nose, sore throat, diarrhea, nausea, vomiting, cold or Flu symptoms, recent or current infections. It is specially important if the infection is over the area that we intend to treat. Heart and lung problems: Symptoms that may suggest an active cardiopulmonary problem include: cough, chest pain, breathing difficulties or shortness of breath, dizziness, ankle swelling, uncontrolled high or unusually low blood pressure, and/or palpitations. If you are experiencing any of these symptoms, cancel your procedure and contact your primary care physician for an evaluation.  Remember:  Regular Business hours are:  Monday to Thursday 8:00 AM to 4:00 PM  Provider's Schedule: Francisco Naveira, MD:  Procedure days: Tuesday and Thursday 7:30 AM to 4:00 PM  Bilal Lateef, MD:  Procedure days: Monday and Wednesday 7:30 AM to 4:00 PM  ______________________________________________________________________   Epidural Steroid Injection Patient Information  Description: The epidural space surrounds the nerves as they exit the spinal cord.  In some patients, the nerves can be  compressed and inflamed by a bulging disc or a tight spinal canal (spinal stenosis).  By injecting steroids into the epidural space, we can bring irritated nerves into direct contact with a potentially helpful medication.  These steroids act directly on the irritated nerves and can reduce swelling and inflammation which often leads to decreased pain.  Epidural steroids may be injected anywhere along the spine and from the neck to the low back depending upon the location of your pain.   After numbing the skin with local anesthetic (like Novocaine), a small needle is passed into the epidural space slowly.  You may experience a sensation of pressure while this is being done.  The entire block usually last less than 10 minutes.  Conditions which may be treated by epidural steroids:  Low back and leg pain Neck and arm pain Spinal stenosis Post-laminectomy syndrome Herpes zoster (shingles) pain Pain from compression fractures  Preparation for the injection:  Do not eat any solid food or dairy products within 8 hours of your appointment.  You may drink clear liquids up to 3 hours before appointment.  Clear liquids include water, black coffee, juice or soda.  No milk or cream please. You may take your regular medication, including pain medications, with a sip of water before your appointment  Diabetics should hold regular insulin (if taken separately) and take 1/2 normal NPH dos the morning of the procedure.  Carry some sugar containing items with you to your appointment. A driver must accompany you and be prepared to drive you home after your procedure.  Bring all your current medications with your. An IV may be inserted and   sedation may be given at the discretion of the physician.   A blood pressure cuff, EKG and other monitors will often be applied during the procedure.  Some patients may need to have extra oxygen administered for a short period. You will be asked to provide medical information,  including your allergies, prior to the procedure.  We must know immediately if you are taking blood thinners (like Coumadin/Warfarin)  Or if you are allergic to IV iodine contrast (dye). We must know if you could possible be pregnant.  Possible side-effects: Bleeding from needle site Infection (rare, may require surgery) Nerve injury (rare) Numbness & tingling (temporary) Difficulty urinating (rare, temporary) Spinal headache ( a headache worse with upright posture) Light -headedness (temporary) Pain at injection site (several days) Decreased blood pressure (temporary) Weakness in arm/leg (temporary) Pressure sensation in back/neck (temporary)  Call if you experience: Fever/chills associated with headache or increased back/neck pain. Headache worsened by an upright position. New onset weakness or numbness of an extremity below the injection site Hives or difficulty breathing (go to the emergency room) Inflammation or drainage at the infection site Severe back/neck pain Any new symptoms which are concerning to you  Please note:  Although the local anesthetic injected can often make your back or neck feel good for several hours after the injection, the pain will likely return.  It takes 3-7 days for steroids to work in the epidural space.  You may not notice any pain relief for at least that one week.  If effective, we will often do a series of three injections spaced 3-6 weeks apart to maximally decrease your pain.  After the initial series, we generally will wait several months before considering a repeat injection of the same type.  If you have any questions, please call [phone] [facility] 

## 2022-10-15 ENCOUNTER — Other Ambulatory Visit: Payer: Self-pay | Admitting: Student in an Organized Health Care Education/Training Program

## 2022-10-15 DIAGNOSIS — M47816 Spondylosis without myelopathy or radiculopathy, lumbar region: Secondary | ICD-10-CM

## 2022-10-15 NOTE — Progress Notes (Unsigned)
Marilyn Davis has a history of greater than 3 months of moderate to severe pain which is resulted in functional impairment.  The patient has tried various conservative therapeutic options such as NSAIDs, Tylenol, muscle relaxants, physical therapy which was inadequately effective.  Patient's pain is predominantly axial with physical exam (see initial H&P note) and L-MRI findings suggestive of facet arthropathy. Lumbar facet medial branch nerve blocks were discussed with the patient.  Risks and benefits were reviewed.  Patient would like to proceed.   CLINICAL DATA:  Low back pain.  Bilateral leg pain.   EXAM: MRI LUMBAR SPINE WITHOUT AND WITH CONTRAST   TECHNIQUE: Multiplanar and multiecho pulse sequences of the lumbar spine were obtained without and with intravenous contrast.   CONTRAST:  9mL GADAVIST GADOBUTROL 1 MMOL/ML IV SOLN   COMPARISON:  None Available.   FINDINGS: Segmentation:  Standard.   Alignment: Slight (grade 1) anterolisthesis of L4 on L5. Otherwise, normal.   Vertebrae: No focal marrow edema to suggest acute fracture or discitis/osteomyelitis. No suspicious bone lesion. No pathologic enhancement.   Conus medullaris and cauda equina: Conus extends to the L2 level. Conus appears normal. No pathologic enhancement.   Paraspinal and other soft tissues: Negative.   Disc levels:   T12-L1: No significant disc protrusion, foraminal stenosis, or canal stenosis.   L1-L2: No significant disc protrusion, foraminal stenosis, or canal stenosis.   L2-L3: No significant disc protrusion, foraminal stenosis, or canal stenosis.   L3-L4: Slight disc bulging.  No significant stenosis.   L4-L5: Mild uncovering of the disc.  No significant stenosis.   L5-S1: Facet arthropathy. Slight disc bulging. No significant stenosis.   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 Facet arthropathy is greatest at L5-S1.

## 2022-10-24 ENCOUNTER — Encounter: Payer: Self-pay | Admitting: Psychiatry

## 2022-10-24 ENCOUNTER — Ambulatory Visit: Payer: Medicare HMO | Admitting: Psychiatry

## 2022-10-24 VITALS — BP 142/91 | HR 81 | Temp 97.3°F | Ht 65.0 in | Wt 203.0 lb

## 2022-10-24 DIAGNOSIS — F603 Borderline personality disorder: Secondary | ICD-10-CM | POA: Diagnosis not present

## 2022-10-24 DIAGNOSIS — F840 Autistic disorder: Secondary | ICD-10-CM | POA: Diagnosis not present

## 2022-10-24 DIAGNOSIS — Z9189 Other specified personal risk factors, not elsewhere classified: Secondary | ICD-10-CM | POA: Insufficient documentation

## 2022-10-24 DIAGNOSIS — F3174 Bipolar disorder, in full remission, most recent episode manic: Secondary | ICD-10-CM | POA: Diagnosis not present

## 2022-10-24 DIAGNOSIS — Z8659 Personal history of other mental and behavioral disorders: Secondary | ICD-10-CM

## 2022-10-24 MED ORDER — QUETIAPINE FUMARATE 100 MG PO TABS
100.0000 mg | ORAL_TABLET | Freq: Every day | ORAL | 1 refills | Status: DC
Start: 2022-10-24 — End: 2022-12-26

## 2022-10-24 MED ORDER — LAMOTRIGINE 25 MG PO TABS
25.0000 mg | ORAL_TABLET | Freq: Two times a day (BID) | ORAL | 1 refills | Status: DC
Start: 2022-10-24 — End: 2023-04-02

## 2022-10-24 MED ORDER — BUPROPION HCL ER (XL) 150 MG PO TB24: 150.0000 mg | ORAL_TABLET | Freq: Every day | ORAL | 0 refills | Status: DC

## 2022-10-24 MED ORDER — MIRTAZAPINE 15 MG PO TABS
15.0000 mg | ORAL_TABLET | Freq: Every day | ORAL | 1 refills | Status: DC
Start: 2022-10-24 — End: 2023-05-27

## 2022-10-24 MED ORDER — QUETIAPINE FUMARATE 25 MG PO TABS
100.0000 mg | ORAL_TABLET | ORAL | 1 refills | Status: DC
Start: 2022-10-24 — End: 2022-12-26

## 2022-10-24 NOTE — Progress Notes (Signed)
BH MD OP Progress Note  10/24/2022 4:47 PM Marilyn Davis  MRN:  [id number]  Chief Complaint:  Chief Complaint  Patient presents with   Follow-up   Depression   Anxiety   Medication Refill   HPI: Marilyn Davis is a 67 year old Caucasian female who has a history of bipolar disorder, borderline personality disorder, autism spectrum, multiple medical problems including diabetes mellitus, sciatica, dizziness, tremors was evaluated in office today.  Patient today reports she is currently overall doing well although continues to have some anxiety.  She reports she recently ordered 'Diamond puzzles ' on line through a [place] company and currently feels anxious as to whether it will be delivered or not.  That does make her 'feel on edge'.  She however has been coping okay.  Patient reports sleep is better on the higher doses of Seroquel.  She also takes trazodone for sleep.  She denies any side effects at this time.  She reports tremors may have improved.  She could not fill the Cogentin since it was too expensive.  She is currently not taking it.  Denies any significant mood swings, manic or hypomanic symptoms.  Patient denies any suicidality, homicidality or perceptual disturbances.  Patient appeared to be alert, oriented to person place time situation.  3 word memory immediate 3 out of 3, after 5 minutes 3 out of 3.  Patient was able to spell the word 'WORLD' forward and backward.  Patient does report appetite reduction however reports she has been making sure she eats enough.  She would like to lose some weight.  Patient denies any other concerns today.  Visit Diagnosis:    ICD-10-CM   1. Bipolar 1 disorder, manic, full remission (HCC)  F31.74 QUEtiapine (SEROQUEL) 100 MG tablet    QUEtiapine (SEROQUEL) 25 MG tablet    mirtazapine (REMERON) 15 MG tablet    buPROPion (WELLBUTRIN XL) 150 MG 24 hr tablet    EKG 12-Lead    2. Borderline personality disorder (HCC)  F60.3  lamoTRIgine (LAMICTAL) 25 MG tablet    3. Autism spectrum disorder  F84.0     4. History of posttraumatic stress disorder (PTSD)  Z86.59     5. At risk for prolonged QT interval syndrome  Z91.89 EKG 12-Lead      Past Psychiatric History: I have reviewed past psychiatric history from progress note on 07/31/2021.  Past Medical History:  Past Medical History:  Diagnosis Date   Bipolar disorder (HCC)    Diabetes mellitus without complication (HCC)    Sciatica     Past Surgical History:  Procedure Laterality Date   ABDOMINAL HYSTERECTOMY     BREAST BIOPSY Right 09/30/2019   9:00, 5cmfn, Q shape, neg   BREAST BIOPSY Right 09/30/2019   9:00, 9cmfn, vision, neg   EXPLORATORY LAPAROTOMY     SHOULDER SURGERY     TONSILLECTOMY      Family Psychiatric History: I have reviewed family psychiatric history from progress note on 07/31/2021.  Family History:  Family History  Problem Relation Age of Onset   Personality disorder Mother    Bipolar disorder Father    Alcohol abuse Brother    Drug abuse Brother    Suicidality Other    Breast cancer Neg Hx     Social History: I have reviewed social history from progress note on 07/31/2021. Social History   Socioeconomic History   Marital status: Married    Spouse name: johnnie   Number of children: 2  Years of education: Not on file   Highest education level: Some college, no degree  Occupational History   Not on file  Tobacco Use   Smoking status: Never   Smokeless tobacco: Never  Vaping Use   Vaping status: Never Used  Substance and Sexual Activity   Alcohol use: Never   Drug use: Never   Sexual activity: Not Currently  Other Topics Concern   Not on file  Social History Narrative   Not on file   Social Determinants of Health   Financial Resource Strain: Not on file  Food Insecurity: Not on file  Transportation Needs: Not on file  Physical Activity: Not on file  Stress: Not on file  Social Connections: Not on file     Allergies:  Allergies  Allergen Reactions   Sumatriptan Shortness Of Breath    Facial numbness   Pseudoephedrine Hcl Other (See Comments)    manic Other reaction(s): Hallucinations   Cabbage Nausea And Vomiting    Cooked    Ibuprofen Itching    Metabolic Disorder Labs: Lab Results  Component Value Date   HGBA1C 6.2 (H) 03/13/2019   MPG 131.24 03/13/2019   Lab Results  Component Value Date   PROLACTIN 17.2 08/02/2021   No results found for: "CHOL", "TRIG", "HDL", "CHOLHDL", "VLDL", "LDLCALC" Lab Results  Component Value Date   TSH 1.231 08/02/2021   TSH 1.178 03/13/2019    Therapeutic Level Labs: No results found for: "LITHIUM" No results found for: "VALPROATE" No results found for: "CBMZ"  Current Medications: Current Outpatient Medications  Medication Sig Dispense Refill   alendronate (FOSAMAX) 70 MG tablet Take 70 mg by mouth once a week.     atorvastatin (LIPITOR) 10 MG tablet Take 1 tablet (10 mg total) by mouth daily at 6 PM. 30 tablet 1   B-D UF III MINI PEN NEEDLES 31G X 5 MM MISC Inject into the skin.     benztropine (COGENTIN) 0.5 MG tablet Take 1 tablet (0.5 mg total) by mouth daily as needed for tremors. 90 tablet 1   fenofibrate micronized (LOFIBRA) 200 MG capsule Take 200 mg by mouth daily.     fluticasone (FLONASE) 50 MCG/ACT nasal spray Place 2 sprays into both nostrils daily.     gabapentin (NEURONTIN) 300 MG capsule Take 300 mg by mouth at bedtime.     lamoTRIgine (LAMICTAL) 200 MG tablet Take 1 tablet (200 mg total) by mouth daily. Take along with 50 mg daily - total daily dose of 250 mg 90 tablet 1   linaclotide (LINZESS) 145 MCG CAPS capsule Take 145 mcg by mouth daily.     meloxicam (MOBIC) 15 MG tablet Take 15 mg by mouth daily.     montelukast (SINGULAIR) 10 MG tablet Take 1 tablet (10 mg total) by mouth at bedtime. 30 tablet 1   oxyCODONE-acetaminophen (PERCOCET) 10-325 MG tablet Take 1 tablet by mouth 3 (three) times daily.      pantoprazole (PROTONIX) 40 MG tablet Take 40 mg by mouth daily.     propranolol (INDERAL) 20 MG tablet Take 20 mg by mouth 2 (two) times daily.     QUEtiapine (SEROQUEL) 100 MG tablet Take 1 tablet (100 mg total) by mouth at bedtime. Take along with 75 mg at bedtime 90 tablet 1   RYBELSUS 3 MG TABS Take 3 mg by mouth daily.     SUMAtriptan (IMITREX) 50 MG tablet Take by mouth.     traZODone (DESYREL) 100 MG tablet TAKE 1  TO 2 TABLETS AT     BEDTIME AS NEEDED FOR SLEEP. 90 tablet 0   UBRELVY 50 MG TABS Take 1 tablet by mouth daily.     buPROPion (WELLBUTRIN XL) 150 MG 24 hr tablet Take 1 tablet (150 mg total) by mouth daily. 90 tablet 0   lamoTRIgine (LAMICTAL) 25 MG tablet Take 1 tablet (25 mg total) by mouth 2 (two) times daily. TAKE ALONG WITH 200MG  DAILY FOR TOTAL OF 250MG  DAILY 180 tablet 1   mirtazapine (REMERON) 15 MG tablet Take 1 tablet (15 mg total) by mouth at bedtime. 90 tablet 1   QUEtiapine (SEROQUEL) 25 MG tablet Take 4 tablets (100 mg total) by mouth as directed. Take 1 tablet daily morning and 3 tablets daily at bedtime( take along with 100 mg at bedtime) 360 tablet 1   traMADol (ULTRAM) 50 MG tablet Take 50 mg by mouth every 6 (six) hours as needed. (Patient not taking: Reported on 10/24/2022)     No current facility-administered medications for this visit.     Musculoskeletal: Strength & Muscle Tone: within normal limits Gait & Station: normal Patient leans: N/A  Psychiatric Specialty Exam: Review of Systems  Psychiatric/Behavioral:  The patient is nervous/anxious.     Blood pressure (!) 142/91, pulse 81, temperature (!) 97.3 F (36.3 C), temperature source Skin, height 5\' 5"  (1.651 m), weight 203 lb (92.1 kg).Body mass index is 33.78 kg/m.  General Appearance: Fairly Groomed  Eye Contact:  Fair  Speech:  Clear and Coherent  Volume:  Normal  Mood:  Anxious  Affect:  Appropriate  Thought Process:  Goal Directed and Descriptions of Associations: Intact  Orientation:   Full (Time, Place, and Person)  Thought Content: Logical   Suicidal Thoughts:  No  Homicidal Thoughts:  No  Memory:  Immediate;   Fair Recent;   Fair Remote;   Fair  Judgement:  Fair  Insight:  Fair  Psychomotor Activity:  Normal  Concentration:  Concentration: Fair and Attention Span: Fair  Recall:  [employer] of Knowledge: Fair  Language: Fair  Akathisia:  No  Handed:  Right  AIMS (if indicated): done  Assets:  Communication Skills Desire for Improvement Housing Intimacy Social Support  ADL's:  Intact  Cognition: WNL  Sleep:  Fair   Screenings: Midwife Visit from 10/24/2022 in [facility] Office Visit from 07/22/2022 in [facility] Office Visit from 04/19/2022 in [facility] Office Visit from 02/14/2022 in [facility] Office Visit from 12/28/2021 in [facility]  AIMS Total Score 0 0 0 0 0      AUDIT    Flowsheet Row Admission (Discharged) from 03/14/2019 in [facility]  Alcohol Use Disorder Identification Test Final Score (AUDIT) 0      GAD-7    Flowsheet Row Office Visit from 10/24/2022 in [facility] Office Visit from 07/22/2022 in [facility] Office Visit from 04/19/2022 in [facility] Office Visit from 02/14/2022 in [facility] Office Visit from 12/28/2021 in [facility]  Total GAD-7 Score 1 7 1 3  0      PHQ2-9    Flowsheet Row Office Visit from 10/24/2022 in [facility] Office Visit from 07/22/2022 in [facility] Office Visit from 04/19/2022 in [facility] Office Visit from 02/14/2022 in [facility] Office Visit from 12/28/2021 in [facility]  PHQ-2 Total Score 0 2 0 3 2  PHQ-9 Total Score -- 5 5 9 8       Flowsheet Row Office Visit from 10/24/2022 in [facility] Office Visit from 07/22/2022 in [facility] ED from 07/09/2022 in [facility] Emergency Department at [facility]  C-SSRS RISK CATEGORY Low Risk No Risk No Risk        Assessment and Plan: Marilyn Davis is a 67 year old Caucasian female who has a history of bipolar disorder, borderline personality disorder, autism spectrum was evaluated in office today.  Patient is currently stable.  Plan as noted below.  Plan Bipolar disorder type I depressed in remission Continue Seroquel 25 mg p.o. daily in the morning, 175 mg p.o. daily at bedtime Continue mirtazapine 15 mg p.o. nightly Trazodone 100-200 mg p.o. nightly as needed Lamotrigine 200 mg p.o. daily at bedtime, 50 mg p.o. daily during the day Wellbutrin XL 150 mg p.o. daily  Borderline personality disorder-stable Patient was referred for CBT in the past-pending  Autism spectrum disorder-chronic-stable Continue CBT as needed  At risk for prolonged QT syndrome-with recent dosage increase of Seroquel patient may benefit from repeat EKG-patient to call [phone], EKG ordered place.  Patient will also benefit from labs including lipid panel, hemoglobin A1c, prolactin, sodium, platelet, LFT-patient reports she has upcoming visit with primary care coming up and she will get labs done at her visit.  If not we will order at next visit.  Follow-up in clinic in 2 months or sooner if needed.      Consent: Patient/Guardian gives verbal consent for treatment and assignment of benefits for services provided during this  visit. Patient/Guardian expressed understanding and agreed to proceed.   This note was generated in part or whole with voice recognition software. Voice recognition is usually quite accurate but there are transcription errors that can and very often do occur. I apologize for any typographical errors that were not detected and corrected.    Jomarie Longs, MD 10/24/2022, 4:47 PM

## 2022-10-24 NOTE — Patient Instructions (Signed)
Please call for EKG-[phone] 

## 2022-10-29 ENCOUNTER — Ambulatory Visit
Admission: RE | Admit: 2022-10-29 | Discharge: 2022-10-29 | Disposition: A | Discharge status: Home / Self Care | Payer: Medicare HMO | Source: Ambulatory Visit | Attending: Student in an Organized Health Care Education/Training Program | Admitting: Student in an Organized Health Care Education/Training Program

## 2022-10-29 DIAGNOSIS — R9431 Abnormal electrocardiogram [ECG] [EKG]: Secondary | ICD-10-CM

## 2022-10-29 DIAGNOSIS — M47816 Spondylosis without myelopathy or radiculopathy, lumbar region: Secondary | ICD-10-CM | POA: Diagnosis present

## 2022-10-29 DIAGNOSIS — Z01818 Encounter for other preprocedural examination: Secondary | ICD-10-CM | POA: Diagnosis not present

## 2022-10-30 ENCOUNTER — Ambulatory Visit
Payer: Medicare HMO | Attending: Student in an Organized Health Care Education/Training Program | Admitting: Student in an Organized Health Care Education/Training Program

## 2022-10-30 ENCOUNTER — Ambulatory Visit
Admission: RE | Admit: 2022-10-30 | Discharge: 2022-10-30 | Disposition: A | Discharge status: Home / Self Care | Payer: Medicare HMO | Source: Ambulatory Visit | Attending: Student in an Organized Health Care Education/Training Program | Admitting: Student in an Organized Health Care Education/Training Program

## 2022-10-30 ENCOUNTER — Encounter: Payer: Self-pay | Admitting: Student in an Organized Health Care Education/Training Program

## 2022-10-30 DIAGNOSIS — M47816 Spondylosis without myelopathy or radiculopathy, lumbar region: Secondary | ICD-10-CM | POA: Diagnosis present

## 2022-10-30 MED ORDER — FENTANYL CITRATE (PF) 100 MCG/2ML IJ SOLN
INTRAMUSCULAR | Status: AC
  Filled 2022-10-30: qty 2

## 2022-10-30 MED ORDER — MIDAZOLAM HCL 5 MG/5ML IJ SOLN
INTRAMUSCULAR | Status: AC
  Filled 2022-10-30: qty 5

## 2022-10-30 MED ORDER — DEXAMETHASONE SODIUM PHOSPHATE 10 MG/ML IJ SOLN
10.0000 mg | Freq: Once | INTRAMUSCULAR | Status: AC
  Administered 2022-10-30: 10 mg

## 2022-10-30 MED ORDER — MIDAZOLAM HCL 5 MG/5ML IJ SOLN
0.5000 mg | Freq: Once | INTRAMUSCULAR | Status: AC
  Administered 2022-10-30: 2 mg via INTRAVENOUS

## 2022-10-30 MED ORDER — ROPIVACAINE HCL 2 MG/ML IJ SOLN
9.0000 mL | Freq: Once | INTRAMUSCULAR | Status: AC
  Administered 2022-10-30: 9 mL via PERINEURAL

## 2022-10-30 MED ORDER — LACTATED RINGERS IV SOLN: Freq: Once | INTRAVENOUS | Status: AC

## 2022-10-30 MED ORDER — ROPIVACAINE HCL 2 MG/ML IJ SOLN
INTRAMUSCULAR | Status: AC
  Filled 2022-10-30: qty 20

## 2022-10-30 MED ORDER — LIDOCAINE HCL 2 % IJ SOLN
INTRAMUSCULAR | Status: AC
  Filled 2022-10-30: qty 20

## 2022-10-30 MED ORDER — LIDOCAINE HCL 2 % IJ SOLN
20.0000 mL | Freq: Once | INTRAMUSCULAR | Status: AC
  Administered 2022-10-30: 400 mg

## 2022-10-30 MED ORDER — DEXAMETHASONE SODIUM PHOSPHATE 10 MG/ML IJ SOLN
INTRAMUSCULAR | Status: AC
  Filled 2022-10-30: qty 2

## 2022-10-30 MED ORDER — FENTANYL CITRATE (PF) 100 MCG/2ML IJ SOLN
25.0000 ug | INTRAMUSCULAR | Status: DC | PRN
  Administered 2022-10-30: 50 ug via INTRAVENOUS

## 2022-10-30 NOTE — Progress Notes (Signed)
Safety precautions to be maintained throughout the outpatient stay will include: orient to surroundings, keep bed in low position, maintain call bell within reach at all times, provide assistance with transfer out of bed and ambulation.  

## 2022-10-30 NOTE — Patient Instructions (Signed)

## 2022-10-30 NOTE — Progress Notes (Signed)
PROVIDER NOTE: Interpretation of information contained herein should be left to medically-trained personnel. Specific patient instructions are provided elsewhere under "Patient Instructions" section of medical record. This document was created in part using STT-dictation technology, any transcriptional errors that may result from this process are unintentional.  Patient: Marilyn Davis Type: Established DOB: Sep 29, 1955 MRN: [id number] PCP: [facility]  Service: Procedure DOS: 10/30/2022 Setting: Ambulatory Location: Ambulatory outpatient facility Delivery: Face-to-face Provider: Edward Jolly, MD Specialty: Interventional Pain Management Specialty designation: 09 Location: Outpatient facility Ref. Prov.: Edward Jolly, MD       Interventional Therapy   Procedure: Lumbar Facet, Medial Branch Block(s) #2  Laterality: Bilateral  Level: L3, L4, and L5 Medial Branch Level(s). Injecting these levels blocks the L3-4 and L4-5 lumbar facet joints. Imaging: Fluoroscopic guidance         Anesthesia: Local anesthesia (1-2% Lidocaine) Sedation: moderate DOS: 10/30/2022 Performed by: Edward Jolly, MD  Primary Purpose: Diagnostic/Therapeutic Indications: Low back pain severe enough to impact quality of life or function. 1. Lumbar facet arthropathy   2. Lumbar spondylosis    NAS-11 Pain score:   Pre-procedure: 4 /10   Post-procedure: 4 /10     Position / Prep / Materials:  Position: Prone  Prep solution: DuraPrep (Iodine Povacrylex [0.7% available iodine] and Isopropyl Alcohol, 74% w/w) Area Prepped: Posterolateral Lumbosacral Spine (Wide prep: From the lower border of the scapula down to the end of the tailbone and from flank to flank.)  Materials:  Tray: Block Needle(s):  Type: Spinal  Gauge (G): 22  Length: 5-in Qty: 2      H&P (Pre-op Assessment):  Marilyn Davis is a 67 y.o. (year old), female patient, seen today for interventional treatment. She  has a  past surgical history that includes Tonsillectomy; Abdominal hysterectomy; Exploratory laparotomy; Shoulder surgery; Breast biopsy (Right, 09/30/2019); and Breast biopsy (Right, 09/30/2019). Marilyn Davis has a current medication list which includes the following prescription(s): alendronate, atorvastatin, b-d uf iii mini pen needles, benztropine, bupropion, fenofibrate micronized, fluticasone, gabapentin, lamotrigine, lamotrigine, linaclotide, meloxicam, mirtazapine, montelukast, oxycodone-acetaminophen, pantoprazole, propranolol, quetiapine, quetiapine, rybelsus, sumatriptan, tramadol, trazodone, and ubrelvy, and the following Facility-Administered Medications: fentanyl and lactated ringers. Her primarily concern today is the Back Pain (lower)  Initial Vital Signs:  Pulse/HCG Rate: 85ECG Heart Rate: 81 Temp: (!) 97.2 F (36.2 C) Resp: 16 BP: (!) 126/58 SpO2: 98 %  BMI: Estimated body mass index is 33.95 kg/m as calculated from the following:   Height as of this encounter: 5\' 5"  (1.651 m).   Weight as of this encounter: 204 lb (92.5 kg).  Risk Assessment: Allergies: Reviewed. She is allergic to sumatriptan, pseudoephedrine hcl, cabbage, and ibuprofen.  Allergy Precautions: None required Coagulopathies: Reviewed. None identified.  Blood-thinner therapy: None at this time Active Infection(s): Reviewed. None identified. Marilyn Davis is afebrile  Site Confirmation: Marilyn Davis was asked to confirm the procedure and laterality before marking the site Procedure checklist: Completed Consent: Before the procedure and under the influence of no sedative(s), amnesic(s), or anxiolytics, the patient was informed of the treatment options, risks and possible complications. To fulfill our ethical and legal obligations, as recommended by the American Medical Association's Code of Ethics, I have informed the patient of my clinical impression; the nature and purpose of the treatment or procedure; the risks,  benefits, and possible complications of the intervention; the alternatives, including doing nothing; the risk(s) and benefit(s) of the alternative treatment(s) or procedure(s); and the risk(s) and benefit(s) of doing nothing. The patient was  provided information about the general risks and possible complications associated with the procedure. These may include, but are not limited to: failure to achieve desired goals, infection, bleeding, organ or nerve damage, allergic reactions, paralysis, and death. In addition, the patient was informed of those risks and complications associated to Spine-related procedures, such as failure to decrease pain; infection (i.e.: Meningitis, epidural or intraspinal abscess); bleeding (i.e.: epidural hematoma, subarachnoid hemorrhage, or any other type of intraspinal or peri-dural bleeding); organ or nerve damage (i.e.: Any type of peripheral nerve, nerve root, or spinal cord injury) with subsequent damage to sensory, motor, and/or autonomic systems, resulting in permanent pain, numbness, and/or weakness of one or several areas of the body; allergic reactions; (i.e.: anaphylactic reaction); and/or death. Furthermore, the patient was informed of those risks and complications associated with the medications. These include, but are not limited to: allergic reactions (i.e.: anaphylactic or anaphylactoid reaction(s)); adrenal axis suppression; blood sugar elevation that in diabetics may result in ketoacidosis or comma; water retention that in patients with history of congestive heart failure may result in shortness of breath, pulmonary edema, and decompensation with resultant heart failure; weight gain; swelling or edema; medication-induced neural toxicity; particulate matter embolism and blood vessel occlusion with resultant organ, and/or nervous system infarction; and/or aseptic necrosis of one or more joints. Finally, the patient was informed that Medicine is not an exact science;  therefore, there is also the possibility of unforeseen or unpredictable risks and/or possible complications that may result in a catastrophic outcome. The patient indicated having understood very clearly. We have given the patient no guarantees and we have made no promises. Enough time was given to the patient to ask questions, all of which were answered to the patient's satisfaction. Ms. Prucha has indicated that she wanted to continue with the procedure. Attestation: I, the ordering provider, attest that I have discussed with the patient the benefits, risks, side-effects, alternatives, likelihood of achieving goals, and potential problems during recovery for the procedure that I have provided informed consent. Date  Time: 10/30/2022  8:16 AM   Pre-Procedure Preparation:  Monitoring: As per clinic protocol. Respiration, ETCO2, SpO2, BP, heart rate and rhythm monitor placed and checked for adequate function Safety Precautions: Patient was assessed for positional comfort and pressure points before starting the procedure. Time-out: I initiated and conducted the "Time-out" before starting the procedure, as per protocol. The patient was asked to participate by confirming the accuracy of the "Time Out" information. Verification of the correct person, site, and procedure were performed and confirmed by me, the nursing staff, and the patient. "Time-out" conducted as per Joint Commission's Universal Protocol (UP.01.01.01). Time: 0848 Start Time: 0848 hrs.  Description of Procedure:          Laterality: (see above) Targeted Levels: (see above)  Safety Precautions: Aspiration looking for blood return was conducted prior to all injections. At no point did we inject any substances, as a needle was being advanced. Before injecting, the patient was told to immediately notify me if she was experiencing any new onset of "ringing in the ears, or metallic taste in the mouth". No attempts were made at seeking any  paresthesias. Safe injection practices and needle disposal techniques used. Medications properly checked for expiration dates. SDV (single dose vial) medications used. After the completion of the procedure, all disposable equipment used was discarded in the proper designated medical waste containers. Local Anesthesia: Protocol guidelines were followed. The patient was positioned over the fluoroscopy table. The area was prepped in  the usual manner. The time-out was completed. The target area was identified using fluoroscopy. A 12-in long, straight, sterile hemostat was used with fluoroscopic guidance to locate the targets for each level blocked. Once located, the skin was marked with an approved surgical skin marker. Once all sites were marked, the skin (epidermis, dermis, and hypodermis), as well as deeper tissues (fat, connective tissue and muscle) were infiltrated with a small amount of a short-acting local anesthetic, loaded on a 10cc syringe with a 25G, 1.5-in  Needle. An appropriate amount of time was allowed for local anesthetics to take effect before proceeding to the next step. Local Anesthetic: Lidocaine 2.0% The unused portion of the local anesthetic was discarded in the proper designated containers. Technical description of process:   L3 Medial Branch Nerve Block (MBB): The target area for the L3 medial branch is at the junction of the postero-lateral aspect of the superior articular process and the superior, posterior, and medial edge of the transverse process of L4. Under fluoroscopic guidance, a Quincke needle was inserted until contact was made with os over the superior postero-lateral aspect of the pedicular shadow (target area). After negative aspiration for blood, 2mL of the nerve block solution was injected without difficulty or complication. The needle was removed intact. L4 Medial Branch Nerve Block (MBB): The target area for the L4 medial branch is at the junction of the postero-lateral  aspect of the superior articular process and the superior, posterior, and medial edge of the transverse process of L5. Under fluoroscopic guidance, a Quincke needle was inserted until contact was made with os over the superior postero-lateral aspect of the pedicular shadow (target area). After negative aspiration for blood, 2mL of the nerve block solution was injected without difficulty or complication. The needle was removed intact. L5 Medial Branch Nerve Block (MBB): The target area for the L5 medial branch is at the junction of the postero-lateral aspect of the superior articular process and the superior, posterior, and medial edge of the sacral ala. Under fluoroscopic guidance, a Quincke needle was inserted until contact was made with os over the superior postero-lateral aspect of the pedicular shadow (target area). After negative aspiration for blood, 2mL of the nerve block solution was injected without difficulty or complication. The needle was removed intact.   Once the entire procedure was completed, the treated area was cleaned, making sure to leave some of the prepping solution back to take advantage of its long term bactericidal properties.         Illustration of the posterior view of the lumbar spine and the posterior neural structures. Laminae of L2 through S1 are labeled. DPRL5, dorsal primary ramus of L5; DPRS1, dorsal primary ramus of S1; DPR3, dorsal primary ramus of L3; FJ, facet (zygapophyseal) joint L3-L4; I, inferior articular process of L4; LB1, lateral branch of dorsal primary ramus of L1; IAB, inferior articular branches from L3 medial branch (supplies L4-L5 facet joint); IBP, intermediate branch plexus; MB3, medial branch of dorsal primary ramus of L3; NR3, third lumbar nerve root; S, superior articular process of L5; SAB, superior articular branches from L4 (supplies L4-5 facet joint also); TP3, transverse process of L3.   Facet Joint Innervation (* possible contribution)   L1-2 T12, L1 (L2*)  Medial Branch  L2-3 L1, L2 (L3*)         "          "  L3-4 L2, L3 (L4*)         "          "  L4-5 L3, L4 (L5*)         "          "  L5-S1 L4, L5, S1          "          "    Vitals:   10/30/22 0840 10/30/22 0845 10/30/22 0850 10/30/22 0855  BP: (!) 147/83 (!) 155/77 (!) 135/57 (!) 125/59  Pulse:      Resp: 15 13 14 12   Temp:      SpO2: 99% 99% 95% 96%  Weight:      Height:         End Time: 0854 hrs.  Imaging Guidance (Spinal):          Type of Imaging Technique: Fluoroscopy Guidance (Spinal) Indication(s): Assistance in needle guidance and placement for procedures requiring needle placement in or near specific anatomical locations not easily accessible without such assistance. Exposure Time: Please see nurses notes. Contrast: None used. Fluoroscopic Guidance: I was personally present during the use of fluoroscopy. "Tunnel Vision Technique" used to obtain the best possible view of the target area. Parallax error corrected before commencing the procedure. "Direction-depth-direction" technique used to introduce the needle under continuous pulsed fluoroscopy. Once target was reached, antero-posterior, oblique, and lateral fluoroscopic projection used confirm needle placement in all planes. Images permanently stored in EMR. Interpretation: No contrast injected. I personally interpreted the imaging intraoperatively. Adequate needle placement confirmed in multiple planes. Permanent images saved into the patient's record.  Post-operative Assessment:  Post-procedure Vital Signs:  Pulse/HCG Rate: 8580 Temp: (!) 97.2 F (36.2 C) Resp: 12 BP: (!) 125/59 SpO2: 96 %  EBL: None  Complications: No immediate post-treatment complications observed by team, or reported by patient.  Note: The patient tolerated the entire procedure well. A repeat set of vitals were taken after the procedure and the patient was kept under observation following institutional policy, for  this type of procedure. Post-procedural neurological assessment was performed, showing return to baseline, prior to discharge. The patient was provided with post-procedure discharge instructions, including a section on how to identify potential problems. Should any problems arise concerning this procedure, the patient was given instructions to immediately contact us, at any time, without hesitation. In any case, we plan to contact the patient by telephone for a follow-up status report regarding this interventional procedure.  Comments:  No additional relevant information.  Plan of Care (POC)  Orders:  Orders Placed This Encounter  Procedures   DG PAIN CLINIC C-ARM 1-60 MIN NO REPORT    Intraoperative interpretation by procedural physician at [facility].    Standing Status:   Standing    Number of Occurrences:   1    Order Specific Question:   Reason for exam:    Answer:   Assistance in needle guidance and placement for procedures requiring needle placement in or near specific anatomical locations not easily accessible without such assistance.    Medications ordered for procedure: Meds ordered this encounter  Medications   lidocaine (XYLOCAINE) 2 % (with pres) injection 400 mg   lactated ringers infusion   midazolam (VERSED) 5 MG/5ML injection 0.5-2 mg    Make sure Flumazenil is available in the pyxis when using this medication. If oversedation occurs, administer 0.2 mg IV over 15 sec. If after 45 sec no response, administer 0.2 mg again over 1 min; may repeat at 1 min intervals; not to exceed 4 doses (1 mg)   fentaNYL (SUBLIMAZE) injection 25-50 mcg  Make sure Narcan is available in the pyxis when using this medication. In the event of respiratory depression (RR< 8/min): Titrate NARCAN (naloxone) in increments of 0.1 to 0.2 mg IV at 2-3 minute intervals, until desired degree of reversal.   dexamethasone (DECADRON) injection 10 mg   dexamethasone (DECADRON) injection 10 mg    ropivacaine (PF) 2 mg/mL (0.2%) (NAROPIN) injection 9 mL   ropivacaine (PF) 2 mg/mL (0.2%) (NAROPIN) injection 9 mL   Medications administered: We administered lidocaine, lactated ringers, midazolam, fentaNYL, dexamethasone, dexamethasone, ropivacaine (PF) 2 mg/mL (0.2%), and ropivacaine (PF) 2 mg/mL (0.2%).  See the medical record for exact dosing, route, and time of administration.  Follow-up plan:   Return in about 3 weeks (around 11/20/2022) for PPE, VV.       B/L L3,4,5 MBNB #1 09/11/22, 10/30/22    Recent Visits Date Type Provider Dept  10/03/22 Office Visit Edward Jolly, MD [facility]  09/11/22 Procedure visit Edward Jolly, MD [facility]  08/20/22 Office Visit Edward Jolly, MD [facility]  Showing recent visits within past 90 days and meeting all other requirements Today's Visits Date Type Provider Dept  10/30/22 Procedure visit Edward Jolly, MD [facility]  Showing today's visits and meeting all other requirements Future Appointments Date Type Provider Dept  11/20/22 Appointment Edward Jolly, MD [facility]  Showing future appointments within next 90 days and meeting all other requirements  Disposition: Discharge home  Discharge (Date  Time): 10/30/2022; 0930 hrs.   Primary Care Physician: [facility] Location: [facility] Note by: Edward Jolly, MD (TTS technology used. I apologize for any typographical errors that were not detected and corrected.) Date: 10/30/2022; Time: 9:05 AM  Disclaimer:  Medicine is not an Visual merchandiser. The only guarantee in medicine is that nothing is guaranteed. It is important to note that the decision to proceed with this intervention was based on the information collected from the patient. The Data and conclusions were drawn from the patient's questionnaire, the interview, and the physical examination. Because the information was provided in  large part by the patient, it cannot be guaranteed that it has not been purposely or unconsciously manipulated. Every effort has been made to obtain as much relevant data as possible for this evaluation. It is important to note that the conclusions that lead to this procedure are derived in large part from the available data. Always take into account that the treatment will also be dependent on availability of resources and existing treatment guidelines, considered by other Pain Management Practitioners as being common knowledge and practice, at the time of the intervention. For Medico-Legal purposes, it is also important to point out that variation in procedural techniques and pharmacological choices are the acceptable norm. The indications, contraindications, technique, and results of the above procedure should only be interpreted and judged by a Board-Certified Interventional Pain Specialist with extensive familiarity and expertise in the same exact procedure and technique.

## 2022-10-31 ENCOUNTER — Telehealth: Payer: Self-pay | Admitting: *Deleted

## 2022-10-31 NOTE — Telephone Encounter (Signed)
Spoke with patient's husband, he reports patient ok post procedure.

## 2022-11-04 ENCOUNTER — Telehealth: Payer: Self-pay

## 2022-11-04 NOTE — Telephone Encounter (Signed)
went online to submit the priora auth - pa was denied.

## 2022-11-04 NOTE — Telephone Encounter (Signed)
received fax that a prior auth was needed for the benztropine mesylate 0.5mg 

## 2022-11-04 NOTE — Telephone Encounter (Signed)
Contacted patient to discuss, she no longer takes the benztropine.

## 2022-11-19 ENCOUNTER — Telehealth: Payer: Self-pay | Admitting: Student in an Organized Health Care Education/Training Program

## 2022-11-19 NOTE — Telephone Encounter (Signed)
Attempted to call for pre appointment review of post procedure eval.  Message left.

## 2022-11-20 ENCOUNTER — Ambulatory Visit
Payer: Medicare HMO | Attending: Student in an Organized Health Care Education/Training Program | Admitting: Student in an Organized Health Care Education/Training Program

## 2022-11-20 DIAGNOSIS — G894 Chronic pain syndrome: Secondary | ICD-10-CM | POA: Diagnosis not present

## 2022-11-20 DIAGNOSIS — M47816 Spondylosis without myelopathy or radiculopathy, lumbar region: Secondary | ICD-10-CM | POA: Diagnosis not present

## 2022-11-20 NOTE — Progress Notes (Signed)
Patient: Marilyn Davis  Service Category: E/M  Provider: Edward Jolly, MD  DOB: 1955/03/23  DOS: 11/20/2022  Location: Office  MRN: [id number]  Setting: Ambulatory outpatient  Referring Provider: [facility]  Type: Established Patient  Specialty: Interventional Pain Management  PCP: [facility]  Location: Remote location  Delivery: TeleHealth     Virtual Encounter - Pain Management PROVIDER NOTE: Information contained herein reflects review and annotations entered in association with encounter. Interpretation of such information and data should be left to medically-trained personnel. Information provided to patient can be located elsewhere in the medical record under "Patient Instructions". Document created using STT-dictation technology, any transcriptional errors that may result from process are unintentional.    Contact & Pharmacy Preferred: [phone] Home: [phone] (home) Mobile: [phone] (mobile) E-mail: [email or web address]  [id number] [place], [place] - [address] [place] [address] Phone: [phone] Fax: [phone]  [facility] - [place], [place] - [address] [facility] AT Portal to Registered [facility] Sites [address] [place] [address] Phone: [phone] Fax: [phone]  [facility] [id number] [place], [place] - [address] [place] [address] Phone: [phone] Fax: [phone]   Pre-screening  Marilyn Davis offered "in-person" vs "virtual" encounter. She indicated preferring virtual for this encounter.   Reason COVID-19*  Social distancing based on CDC and AMA recommendations.   I contacted Marilyn Davis on 11/20/2022 via telephone.      I clearly identified myself as Edward Jolly, MD. I verified that I was speaking with the correct person using two identifiers (Name: Marilyn Davis, and date of birth:  [birth year]).  Consent I sought verbal advanced consent from Marilyn Davis for virtual visit interactions. I informed Marilyn Davis of possible security and privacy concerns, risks, and limitations associated with providing "not-in-person" medical evaluation and management services. I also informed Marilyn Davis of the availability of "in-person" appointments. Finally, I informed her that there would be a charge for the virtual visit and that she could be  personally, fully or partially, financially responsible for it. Ms. Keplar expressed understanding and agreed to proceed.   Historic Elements   Marilyn Davis is a 67 y.o. year old, female patient evaluated today after our last contact on 11/19/2022. Marilyn Davis  has a past medical history of Bipolar disorder (HCC), Diabetes mellitus without complication (HCC), and Sciatica. She also  has a past surgical history that includes Tonsillectomy; Abdominal hysterectomy; Exploratory laparotomy; Shoulder surgery; Breast biopsy (Right, 09/30/2019); and Breast biopsy (Right, 09/30/2019). Marilyn Davis has a current medication list which includes the following prescription(s): alendronate, atorvastatin, b-d uf iii mini pen needles, bupropion, fenofibrate micronized, fluticasone, gabapentin, lamotrigine, lamotrigine, linaclotide, meloxicam, mirtazapine, montelukast, oxycodone-acetaminophen, pantoprazole, propranolol, quetiapine, quetiapine, rybelsus, sumatriptan, tramadol, trazodone, and ubrelvy. She  reports that she has never smoked. She has never used smokeless tobacco. She reports that she does not drink alcohol and does not use drugs. Marilyn Davis is allergic to sumatriptan, pseudoephedrine hcl, cabbage, and ibuprofen.  BMI: Estimated body mass index is 33.95 kg/m as calculated from the following:   Height as of 10/30/22: 5\' 5"  (1.651 m).   Weight as of 10/30/22: 204 lb (92.5 kg). Last encounter: 10/03/2022. Last procedure: 10/30/2022.  HPI   Today, she is being contacted for a post-procedure assessment.   Post-procedure evaluation   Procedure: Lumbar Facet, Medial Branch Block(s) #2  Laterality: Bilateral  Level: L3, L4,  and L5 Medial Branch Level(s). Injecting these levels blocks the L3-4 and L4-5 lumbar facet joints. Imaging: Fluoroscopic guidance         Anesthesia: Local anesthesia (1-2% Lidocaine) Sedation: moderate DOS: 10/30/2022 Performed by: Edward Jolly, MD  Primary Purpose: Diagnostic/Therapeutic Indications: Low back pain severe enough to impact quality of life or function. 1. Lumbar facet arthropathy   2. Lumbar spondylosis    NAS-11 Pain score:   Pre-procedure: 4 /10   Post-procedure: 4 /10      Effectiveness:  Initial hour after procedure: 100 %  Subsequent 4-6 hours post-procedure: 100 %  Analgesia past initial 6 hours: 90 %  Ongoing improvement:  Analgesic:  75% for 2 weeks Function: Marilyn Davis reports improvement in function ROM: Marilyn Davis reports improvement in ROM   Laboratory Chemistry Profile   Renal Lab Results  Component Value Date   BUN 18 07/29/2021   CREATININE 1.06 (H) 07/29/2021   GFRAA 51 (L) 11/11/2019   GFRNONAA 58 (L) 07/29/2021    Hepatic Lab Results  Component Value Date   AST 22 07/29/2021   ALT 26 07/29/2021   ALBUMIN 4.3 07/29/2021   ALKPHOS 26 (L) 07/29/2021   LIPASE 70 (H) 07/29/2021    Electrolytes Lab Results  Component Value Date   NA 139 07/29/2021   K 4.2 07/29/2021   CL 111 07/29/2021   CALCIUM 9.8 07/29/2021    Bone No results found for: "VD25OH", "VD125OH2TOT", "MV7846NG2", "XB2841LK4", "25OHVITD1", "25OHVITD2", "25OHVITD3", "TESTOFREE", "TESTOSTERONE"  Inflammation (CRP: Acute Phase) (ESR: Chronic Phase) No results found for: "CRP", "ESRSEDRATE", "LATICACIDVEN"       Note: Above Lab results reviewed.   Assessment  The primary encounter diagnosis was Lumbar facet arthropathy. Diagnoses of Lumbar spondylosis and Chronic pain  syndrome were also pertinent to this visit.  Plan of Care  Positive response to bilateral diagnostic facet medial branch nerve blocks.  She has had 2 sets of these, her first 1 being 09/11/2022 followed by 10/30/2022.  We discussed lumbar radiofrequency ablation for the purpose of obtaining longer-term pain relief.  Risk and benefits reviewed and patient would like to proceed.   Orders:  Orders Placed This Encounter  Procedures   Radiofrequency,Lumbar    Standing Status:   Future    Standing Expiration Date:   02/20/2023    Scheduling Instructions:     Side(s): Bilateral     Level(s): L3, L4, L5,  Medial Branch Nerve(s)     Sedation: With Sedation     Scheduling Timeframe: As soon as pre-approved    Order Specific Question:   Where will this procedure be performed?    Answer:   [facility]   Follow-up plan:   Return in about 3 weeks (around 12/11/2022) for B/L L3, 4, 5 RFA, in clinic IV Versed.      B/L L3,4,5 MBNB #1 09/11/22, 10/30/22    Recent Visits Date Type Provider Dept  10/30/22 Procedure visit Edward Jolly, MD [facility]  10/03/22 Office Visit Edward Jolly, MD [facility]  09/11/22 Procedure visit Edward Jolly, MD [facility]  Showing recent visits within past 90 days and meeting all other requirements Today's Visits Date Type Provider Dept  11/20/22 Office Visit Edward Jolly, MD [facility]  Showing today's visits and meeting all other requirements Future Appointments No visits were found meeting these conditions. Showing future appointments within next 90 days and meeting all other requirements  I discussed the assessment and treatment plan with the  patient. The patient was provided an opportunity to ask questions and all were answered. The patient agreed with the plan and demonstrated an understanding of the instructions.  Patient advised to call back or seek an in-person evaluation if the symptoms or condition  worsens.  Duration of encounter: .  Note by: Edward Jolly, MD Date: 11/20/2022; Time: 4:32 PM

## 2022-12-02 ENCOUNTER — Ambulatory Visit
Admission: RE | Admit: 2022-12-02 | Discharge: 2022-12-02 | Disposition: A | Discharge status: Home / Self Care | Payer: Medicare HMO | Source: Ambulatory Visit | Attending: Student in an Organized Health Care Education/Training Program | Admitting: Student in an Organized Health Care Education/Training Program

## 2022-12-02 ENCOUNTER — Ambulatory Visit (HOSPITAL_BASED_OUTPATIENT_CLINIC_OR_DEPARTMENT_OTHER): Payer: Medicare HMO | Admitting: Student in an Organized Health Care Education/Training Program

## 2022-12-02 ENCOUNTER — Encounter: Payer: Self-pay | Admitting: Student in an Organized Health Care Education/Training Program

## 2022-12-02 ENCOUNTER — Other Ambulatory Visit: Payer: Self-pay | Admitting: Student in an Organized Health Care Education/Training Program

## 2022-12-02 DIAGNOSIS — R52 Pain, unspecified: Secondary | ICD-10-CM | POA: Insufficient documentation

## 2022-12-02 DIAGNOSIS — M47816 Spondylosis without myelopathy or radiculopathy, lumbar region: Secondary | ICD-10-CM | POA: Diagnosis not present

## 2022-12-02 DIAGNOSIS — G894 Chronic pain syndrome: Secondary | ICD-10-CM | POA: Diagnosis present

## 2022-12-02 MED ORDER — DEXAMETHASONE SODIUM PHOSPHATE 10 MG/ML IJ SOLN
10.0000 mg | Freq: Once | INTRAMUSCULAR | Status: AC
  Administered 2022-12-02: 10 mg

## 2022-12-02 MED ORDER — ROPIVACAINE HCL 2 MG/ML IJ SOLN
9.0000 mL | Freq: Once | INTRAMUSCULAR | Status: AC
  Administered 2022-12-02: 9 mL via PERINEURAL

## 2022-12-02 MED ORDER — DEXAMETHASONE SODIUM PHOSPHATE 10 MG/ML IJ SOLN
INTRAMUSCULAR | Status: AC
  Filled 2022-12-02: qty 2

## 2022-12-02 MED ORDER — MIDAZOLAM HCL 2 MG/2ML IJ SOLN
0.5000 mg | Freq: Once | INTRAMUSCULAR | Status: AC
  Administered 2022-12-02: 2 mg via INTRAVENOUS

## 2022-12-02 MED ORDER — LIDOCAINE HCL 2 % IJ SOLN
INTRAMUSCULAR | Status: AC
  Filled 2022-12-02: qty 20

## 2022-12-02 MED ORDER — LIDOCAINE HCL 2 % IJ SOLN
20.0000 mL | Freq: Once | INTRAMUSCULAR | Status: AC
  Administered 2022-12-02: 400 mg

## 2022-12-02 MED ORDER — MIDAZOLAM HCL 2 MG/2ML IJ SOLN
INTRAMUSCULAR | Status: AC
  Filled 2022-12-02: qty 2

## 2022-12-02 MED ORDER — ROPIVACAINE HCL 2 MG/ML IJ SOLN
INTRAMUSCULAR | Status: AC
  Filled 2022-12-02: qty 20

## 2022-12-02 MED ORDER — LACTATED RINGERS IV SOLN: Freq: Once | INTRAVENOUS | Status: AC

## 2022-12-02 NOTE — Progress Notes (Signed)
Safety precautions to be maintained throughout the outpatient stay will include: orient to surroundings, keep bed in low position, maintain call bell within reach at all times, provide assistance with transfer out of bed and ambulation.  

## 2022-12-02 NOTE — Patient Instructions (Signed)

## 2022-12-02 NOTE — Progress Notes (Signed)
PROVIDER NOTE: Interpretation of information contained herein should be left to medically-trained personnel. Specific patient instructions are provided elsewhere under "Patient Instructions" section of medical record. This document was created in part using STT-dictation technology, any transcriptional errors that may result from this process are unintentional.  Patient: Marilyn Davis Type: Established DOB: [birth year] MRN: [id number] PCP: [facility]  Service: Procedure DOS: 12/02/2022 Setting: Ambulatory Location: Ambulatory outpatient facility Delivery: Face-to-face Provider: Edward Jolly, MD Specialty: Interventional Pain Management Specialty designation: 09 Location: Outpatient facility Ref. Prov.: Edward Jolly, MD       Interventional Therapy   Procedure: Lumbar Facet, Medial Branch Radiofrequency Ablation (RFA) #1  Laterality: Bilateral (-50)  Level: L3, L4, and L5 Medial Branch Level(s). These levels will denervate the L3-4 and L4-5 lumbar facet joints.  Imaging: Fluoroscopy-guided         Anesthesia: Local anesthesia (1-2% Lidocaine) Sedation: Minimal Sedation                       DOS: 12/02/2022  Performed by: Edward Jolly, MD  Purpose: Therapeutic/Palliative Indications: Low back pain severe enough to impact quality of life or function. Indications: 1. Lumbar facet arthropathy   2. Lumbar spondylosis   3. Chronic pain syndrome    Marilyn Davis has been dealing with the above chronic pain for longer than three months and has either failed to respond, was unable to tolerate, or simply did not get enough benefit from other more conservative therapies including, but not limited to: 1. Over-the-counter medications 2. Anti-inflammatory medications 3. Muscle relaxants 4. Membrane stabilizers 5. Opioids 6. Physical therapy and/or chiropractic manipulation 7. Modalities (Heat, ice, etc.) 8. Invasive techniques such as nerve blocks. Marilyn Davis  has attained more than 50% relief of the pain from a series of diagnostic injections conducted in separate occasions.  Pain Score: Pre-procedure: 7 /10 Post-procedure: 3 /10     Position / Prep / Materials:  Position: Prone  Prep solution: DuraPrep (Iodine Povacrylex [0.7% available iodine] and Isopropyl Alcohol, 74% w/w) Prep Area: Entire Lumbosacral Region (Lower back from mid-thoracic region to end of tailbone and from flank to flank.) Materials:  Tray: RFA (Radiofrequency) tray Needle(s):  Type: RFA (Teflon-coated radiofrequency ablation needles) Gauge (G): 22  Length: Regular (10cm) Qty: 2      H&P (Pre-op Assessment):  Marilyn Davis is a 67 y.o. (year old), female patient, seen today for interventional treatment. She  has a past surgical history that includes Tonsillectomy; Abdominal hysterectomy; Exploratory laparotomy; Shoulder surgery; Breast biopsy (Right, 09/30/2019); and Breast biopsy (Right, 09/30/2019). Marilyn Davis has a current medication list which includes the following prescription(s): alendronate, atorvastatin, b-d uf iii mini pen needles, bupropion, fenofibrate micronized, fluticasone, gabapentin, lamotrigine, lamotrigine, linaclotide, meloxicam, mirtazapine, montelukast, oxycodone-acetaminophen, pantoprazole, propranolol, quetiapine, quetiapine, rybelsus, sumatriptan, tramadol, trazodone, and ubrelvy, and the following Facility-Administered Medications: lactated ringers. Her primarily concern today is the Back Pain  Initial Vital Signs:  Pulse/HCG Rate: 78  Temp: 97.7 F (36.5 C) Resp: 16 BP: 123/68 SpO2: 96 %  BMI: Estimated body mass index is 33.28 kg/m as calculated from the following:   Height as of this encounter: 5\' 5"  (1.651 m).   Weight as of this encounter: 200 lb (90.7 kg).  Risk Assessment: Allergies: Reviewed. She is allergic to sumatriptan, pseudoephedrine hcl, cabbage, and ibuprofen.  Allergy Precautions: None required Coagulopathies:  Reviewed. None identified.  Blood-thinner therapy: None at this time Active Infection(s): Reviewed. None identified. Marilyn Davis is afebrile  Site  Confirmation: Marilyn Davis was asked to confirm the procedure and laterality before marking the site Procedure checklist: Completed Consent: Before the procedure and under the influence of no sedative(s), amnesic(s), or anxiolytics, the patient was informed of the treatment options, risks and possible complications. To fulfill our ethical and legal obligations, as recommended by the American Medical Association's Code of Ethics, I have informed the patient of my clinical impression; the nature and purpose of the treatment or procedure; the risks, benefits, and possible complications of the intervention; the alternatives, including doing nothing; the risk(s) and benefit(s) of the alternative treatment(s) or procedure(s); and the risk(s) and benefit(s) of doing nothing. The patient was provided information about the general risks and possible complications associated with the procedure. These may include, but are not limited to: failure to achieve desired goals, infection, bleeding, organ or nerve damage, allergic reactions, paralysis, and death. In addition, the patient was informed of those risks and complications associated to Spine-related procedures, such as failure to decrease pain; infection (i.e.: Meningitis, epidural or intraspinal abscess); bleeding (i.e.: epidural hematoma, subarachnoid hemorrhage, or any other type of intraspinal or peri-dural bleeding); organ or nerve damage (i.e.: Any type of peripheral nerve, nerve root, or spinal cord injury) with subsequent damage to sensory, motor, and/or autonomic systems, resulting in permanent pain, numbness, and/or weakness of one or several areas of the body; allergic reactions; (i.e.: anaphylactic reaction); and/or death. Furthermore, the patient was informed of those risks and complications associated  with the medications. These include, but are not limited to: allergic reactions (i.e.: anaphylactic or anaphylactoid reaction(s)); adrenal axis suppression; blood sugar elevation that in diabetics may result in ketoacidosis or comma; water retention that in patients with history of congestive heart failure may result in shortness of breath, pulmonary edema, and decompensation with resultant heart failure; weight gain; swelling or edema; medication-induced neural toxicity; particulate matter embolism and blood vessel occlusion with resultant organ, and/or nervous system infarction; and/or aseptic necrosis of one or more joints. Finally, the patient was informed that Medicine is not an exact science; therefore, there is also the possibility of unforeseen or unpredictable risks and/or possible complications that may result in a catastrophic outcome. The patient indicated having understood very clearly. We have given the patient no guarantees and we have made no promises. Enough time was given to the patient to ask questions, all of which were answered to the patient's satisfaction. Ms. Montantes has indicated that she wanted to continue with the procedure. Attestation: I, the ordering provider, attest that I have discussed with the patient the benefits, risks, side-effects, alternatives, likelihood of achieving goals, and potential problems during recovery for the procedure that I have provided informed consent. Date  Time: 12/02/2022 12:12 PM   Pre-Procedure Preparation:  Monitoring: As per clinic protocol. Respiration, ETCO2, SpO2, BP, heart rate and rhythm monitor placed and checked for adequate function Safety Precautions: Patient was assessed for positional comfort and pressure points before starting the procedure. Time-out: I initiated and conducted the "Time-out" before starting the procedure, as per protocol. The patient was asked to participate by confirming the accuracy of the "Time Out"  information. Verification of the correct person, site, and procedure were performed and confirmed by me, the nursing staff, and the patient. "Time-out" conducted as per Joint Commission's Universal Protocol (UP.01.01.01). Time: 1305 Start Time: 1305 hrs.  Description of Procedure:          Laterality: See above. Levels:  See above. Safety Precautions: Aspiration looking for blood return was conducted  prior to all injections. At no point did we inject any substances, as a needle was being advanced. Before injecting, the patient was told to immediately notify me if she was experiencing any new onset of "ringing in the ears, or metallic taste in the mouth". No attempts were made at seeking any paresthesias. Safe injection practices and needle disposal techniques used. Medications properly checked for expiration dates. SDV (single dose vial) medications used. After the completion of the procedure, all disposable equipment used was discarded in the proper designated medical waste containers. Local Anesthesia: Protocol guidelines were followed. The patient was positioned over the fluoroscopy table. The area was prepped in the usual manner. The time-out was completed. The target area was identified using fluoroscopy. A 12-in long, straight, sterile hemostat was used with fluoroscopic guidance to locate the targets for each level blocked. Once located, the skin was marked with an approved surgical skin marker. Once all sites were marked, the skin (epidermis, dermis, and hypodermis), as well as deeper tissues (fat, connective tissue and muscle) were infiltrated with a small amount of a short-acting local anesthetic, loaded on a 10cc syringe with a 25G, 1.5-in  Needle. An appropriate amount of time was allowed for local anesthetics to take effect before proceeding to the next step. Technical description of process:  Radiofrequency Ablation (RFA)  L3 Medial Branch Nerve RFA: The target area for the L3 medial branch  is at the junction of the postero-lateral aspect of the superior articular process and the superior, posterior, and medial edge of the transverse process of L4. Under fluoroscopic guidance, a Radiofrequency needle was inserted until contact was made with os over the superior postero-lateral aspect of the pedicular shadow (target area). Sensory and motor testing was conducted to properly adjust the position of the needle. Once satisfactory placement of the needle was achieved, the numbing solution was slowly injected after negative aspiration for blood. 2.0 mL of the nerve block solution was injected without difficulty or complication. After waiting for at least 3 minutes, the ablation was performed. Once completed, the needle was removed intact. L4 Medial Branch Nerve RFA: The target area for the L4 medial branch is at the junction of the postero-lateral aspect of the superior articular process and the superior, posterior, and medial edge of the transverse process of L5. Under fluoroscopic guidance, a Radiofrequency needle was inserted until contact was made with os over the superior postero-lateral aspect of the pedicular shadow (target area). Sensory and motor testing was conducted to properly adjust the position of the needle. Once satisfactory placement of the needle was achieved, the numbing solution was slowly injected after negative aspiration for blood. 2.0 mL of the nerve block solution was injected without difficulty or complication. After waiting for at least 3 minutes, the ablation was performed. Once completed, the needle was removed intact. L5 Medial Branch Nerve RFA: The target area for the L5 medial branch is at the junction of the postero-lateral aspect of the superior articular process of S1 and the superior, posterior, and medial edge of the sacral ala. Under fluoroscopic guidance, a Radiofrequency needle was inserted until contact was made with os over the superior postero-lateral aspect of the  pedicular shadow (target area). Sensory and motor testing was conducted to properly adjust the position of the needle. Once satisfactory placement of the needle was achieved, the numbing solution was slowly injected after negative aspiration for blood. 2.0 mL of the nerve block solution was injected without difficulty or complication. After waiting for at  least 3 minutes, the ablation was performed. Once completed, the needle was removed intact.  Radiofrequency lesioning (ablation):  Radiofrequency Generator: Medtronic AccurianTM AG 1000 RF Generator Sensory Stimulation Parameters: 50 Hz was used to locate & identify the nerve, making sure that the needle was positioned such that there was no sensory stimulation below 0.3 V or above 0.7 V. Motor Stimulation Parameters: 2 Hz was used to evaluate the motor component. Care was taken not to lesion any nerves that demonstrated motor stimulation of the lower extremities at an output of less than 2.5 times that of the sensory threshold, or a maximum of 2.0 V. Lesioning Technique Parameters: Standard Radiofrequency settings. (Not bipolar or pulsed.) Temperature Settings: 80 degrees C Lesioning time: 60 seconds Stationary intra-operative compliance: Compliant  Once the entire procedure was completed, the treated area was cleaned, making sure to leave some of the prepping solution back to take advantage of its long term bactericidal properties.    Illustration of the posterior view of the lumbar spine and the posterior neural structures. Laminae of L2 through S1 are labeled. DPRL5, dorsal primary ramus of L5; DPRS1, dorsal primary ramus of S1; DPR3, dorsal primary ramus of L3; FJ, facet (zygapophyseal) joint L3-L4; I, inferior articular process of L4; LB1, lateral branch of dorsal primary ramus of L1; IAB, inferior articular branches from L3 medial branch (supplies L4-L5 facet joint); IBP, intermediate branch plexus; MB3, medial branch of dorsal primary ramus of  L3; NR3, third lumbar nerve root; S, superior articular process of L5; SAB, superior articular branches from L4 (supplies L4-5 facet joint also); TP3, transverse process of L3.  Facet Joint Innervation (* possible contribution)  L1-2 T12, L1 (L2*)  Medial Branch  L2-3 L1, L2 (L3*)         "          "  L3-4 L2, L3 (L4*)         "          "  L4-5 L3, L4 (L5*)         "          "  L5-S1 L4, L5, S1          "          "    Vitals:   12/02/22 1320 12/02/22 1325 12/02/22 1329 12/02/22 1337  BP: 136/83 137/86 (!) 148/86 128/61  Pulse: 70 71 68 73  Resp: 14 13 15 16   Temp:      SpO2: 96% 96% 97% 98%  Weight:      Height:        Start Time: 1305 hrs. End Time: 1328 hrs.  Imaging Guidance (Spinal):          Type of Imaging Technique: Fluoroscopy Guidance (Spinal) Indication(s): Assistance in needle guidance and placement for procedures requiring needle placement in or near specific anatomical locations not easily accessible without such assistance. Exposure Time: Please see nurses notes. Contrast: None used. Fluoroscopic Guidance: I was personally present during the use of fluoroscopy. "Tunnel Vision Technique" used to obtain the best possible view of the target area. Parallax error corrected before commencing the procedure. "Direction-depth-direction" technique used to introduce the needle under continuous pulsed fluoroscopy. Once target was reached, antero-posterior, oblique, and lateral fluoroscopic projection used confirm needle placement in all planes. Images permanently stored in EMR. Interpretation: No contrast injected. I personally interpreted the imaging intraoperatively. Adequate needle placement confirmed in multiple planes. Permanent images saved into the patient's record.  Antibiotic Prophylaxis:   Anti-infectives (From admission, onward)    None      Indication(s): None identified  Post-operative Assessment:  Post-procedure Vital Signs:  Pulse/HCG Rate: 73  Temp:  97.7 F (36.5 C) Resp: 16 BP: 128/61 SpO2: 98 %  EBL: None  Complications: No immediate post-treatment complications observed by team, or reported by patient.  Note: The patient tolerated the entire procedure well. A repeat set of vitals were taken after the procedure and the patient was kept under observation following institutional policy, for this type of procedure. Post-procedural neurological assessment was performed, showing return to baseline, prior to discharge. The patient was provided with post-procedure discharge instructions, including a section on how to identify potential problems. Should any problems arise concerning this procedure, the patient was given instructions to immediately contact us, at any time, without hesitation. In any case, we plan to contact the patient by telephone for a follow-up status report regarding this interventional procedure.  Comments:  No additional relevant information.  Plan of Care (POC)  Orders:  No orders of the defined types were placed in this encounter.   Medications ordered for procedure: Meds ordered this encounter  Medications   lidocaine (XYLOCAINE) 2 % (with pres) injection 400 mg   lactated ringers infusion   midazolam (VERSED) injection 0.5-2 mg    Make sure Flumazenil is available in the pyxis when using this medication. If oversedation occurs, administer 0.2 mg IV over 15 sec. If after 45 sec no response, administer 0.2 mg again over 1 min; may repeat at 1 min intervals; not to exceed 4 doses (1 mg)   dexamethasone (DECADRON) injection 10 mg   dexamethasone (DECADRON) injection 10 mg   ropivacaine (PF) 2 mg/mL (0.2%) (NAROPIN) injection 9 mL   ropivacaine (PF) 2 mg/mL (0.2%) (NAROPIN) injection 9 mL   Medications administered: We administered lidocaine, lactated ringers, midazolam, dexamethasone, dexamethasone, ropivacaine (PF) 2 mg/mL (0.2%), and ropivacaine (PF) 2 mg/mL (0.2%).  See the medical record for exact dosing,  route, and time of administration.  Follow-up plan:   Return in about 6 weeks (around 01/13/2023) for F2F PPE.       B/L L3,4,5 MBNB #1 09/11/22, 10/30/22, B/L L3,4,5 RFA 12/02/22     Recent Visits Date Type Provider Dept  11/20/22 Office Visit Edward Jolly, MD [facility]  10/30/22 Procedure visit Edward Jolly, MD [facility]  10/03/22 Office Visit Edward Jolly, MD [facility]  09/11/22 Procedure visit Edward Jolly, MD [facility]  Showing recent visits within past 90 days and meeting all other requirements Today's Visits Date Type Provider Dept  12/02/22 Procedure visit Edward Jolly, MD [facility]  Showing today's visits and meeting all other requirements Future Appointments Date Type Provider Dept  01/13/23 Appointment Edward Jolly, MD [facility]  Showing future appointments within next 90 days and meeting all other requirements  Disposition: Discharge home  Discharge (Date  Time): 12/02/2022; 1338 hrs.   Primary Care Physician: [facility] Location: [facility] Note by: Edward Jolly, MD (TTS technology used. I apologize for any typographical errors that were not detected and corrected.) Date: 12/02/2022; Time: 1:39 PM  Disclaimer:  Medicine is not an Visual merchandiser. The only guarantee in medicine is that nothing is guaranteed. It is important to note that the decision to proceed with this intervention was based on the information collected from the patient. The Data and conclusions were drawn from the patient's questionnaire,  the interview, and the physical examination. Because the information was provided in large part by the patient, it cannot be guaranteed that it has not been purposely or unconsciously manipulated. Every effort has been made to obtain as much relevant data as possible for this evaluation. It is important to note that the conclusions that lead  to this procedure are derived in large part from the available data. Always take into account that the treatment will also be dependent on availability of resources and existing treatment guidelines, considered by other Pain Management Practitioners as being common knowledge and practice, at the time of the intervention. For Medico-Legal purposes, it is also important to point out that variation in procedural techniques and pharmacological choices are the acceptable norm. The indications, contraindications, technique, and results of the above procedure should only be interpreted and judged by a Board-Certified Interventional Pain Specialist with extensive familiarity and expertise in the same exact procedure and technique.

## 2022-12-03 ENCOUNTER — Telehealth: Payer: Self-pay

## 2022-12-03 NOTE — Telephone Encounter (Signed)
Post procedure follow up.  Patient states she is doing good.

## 2022-12-26 ENCOUNTER — Ambulatory Visit: Payer: Medicare HMO | Admitting: Psychiatry

## 2022-12-26 ENCOUNTER — Encounter: Payer: Self-pay | Admitting: Psychiatry

## 2022-12-26 VITALS — BP 112/80 | HR 75 | Temp 97.5°F | Ht 65.0 in | Wt 205.0 lb

## 2022-12-26 DIAGNOSIS — Z8659 Personal history of other mental and behavioral disorders: Secondary | ICD-10-CM

## 2022-12-26 DIAGNOSIS — F603 Borderline personality disorder: Secondary | ICD-10-CM | POA: Diagnosis not present

## 2022-12-26 DIAGNOSIS — F3161 Bipolar disorder, current episode mixed, mild: Secondary | ICD-10-CM | POA: Diagnosis not present

## 2022-12-26 DIAGNOSIS — F840 Autistic disorder: Secondary | ICD-10-CM | POA: Diagnosis not present

## 2022-12-26 DIAGNOSIS — Z9189 Other specified personal risk factors, not elsewhere classified: Secondary | ICD-10-CM

## 2022-12-26 MED ORDER — TRAZODONE HCL 100 MG PO TABS: 100.0000 mg | ORAL_TABLET | Freq: Every evening | ORAL | 3 refills | Status: DC | PRN

## 2022-12-26 MED ORDER — QUETIAPINE FUMARATE 100 MG PO TABS: 200.0000 mg | ORAL_TABLET | Freq: Every day | ORAL | 0 refills | Status: DC

## 2022-12-26 NOTE — Progress Notes (Signed)
BH MD OP Progress Note  12/26/2022 12:13 PM Marilyn Davis  MRN:  [id number]  Chief Complaint:  Chief Complaint  Patient presents with   Follow-up   Depression   Anxiety   Medication Refill   HPI: Marilyn Davis is a 67 year old Caucasian female who has a history of bipolar disorder, borderline personality disorder, autism spectrum, multiple medical problems including diabetes mellitus, sciatica, dizziness, tremors was evaluated in office today.  Patient today reports she has noticed obsessive thoughts, like obsessive thoughts/rumination about a music that she listened to recently from a musical, anxiety symptoms like feeling nervous, worrying, being on edge since the past 1 week or more.  She reports she has also noticed her sleep as restless in the past few days.  She reports all this started around the time of the [place] elections.  She reports she does not like the results and is unhappy with it.  Patient reports she does not want to watch 'Trump ' on TV since that has been anxiety provoking for her.  She does take trazodone at night along with Seroquel.  Patient reports she is currently compliant on all her medications.  Denies side effects.  Patient continues to work on her diamond puzzles.  That does help.  Patient denies any suicidality, homicidality or perceptual disturbances.  Patient denies any other concerns today.   Visit Diagnosis:    ICD-10-CM   1. Bipolar 1 disorder, mixed, mild (HCC)  F31.61 QUEtiapine (SEROQUEL) 100 MG tablet    2. Borderline personality disorder (HCC)  F60.3 traZODone (DESYREL) 100 MG tablet    3. Autism spectrum disorder  F84.0     4. History of posttraumatic stress disorder (PTSD)  Z86.59     5. At risk for prolonged QT interval syndrome  Z91.89       Past Psychiatric History: I have reviewed past psychiatric history from progress note on 07/31/2021.  Past Medical History:  Past Medical History:  Diagnosis Date   Bipolar  disorder (HCC)    Diabetes mellitus without complication (HCC)    Sciatica     Past Surgical History:  Procedure Laterality Date   ABDOMINAL HYSTERECTOMY     BREAST BIOPSY Right 09/30/2019   9:00, 5cmfn, Q shape, neg   BREAST BIOPSY Right 09/30/2019   9:00, 9cmfn, vision, neg   EXPLORATORY LAPAROTOMY     SHOULDER SURGERY     TONSILLECTOMY      Family Psychiatric History: I have reviewed family psychiatric history from progress note on 07/31/2021.  Family History:  Family History  Problem Relation Age of Onset   Personality disorder Mother    Bipolar disorder Father    Alcohol abuse Brother    Drug abuse Brother    Suicidality Other    Breast cancer Neg Hx     Social History: I have reviewed social history from progress note on 07/31/2021. Social History   Socioeconomic History   Marital status: Married    Spouse name: johnnie   Number of children: 2   Years of education: Not on file   Highest education level: Some college, no degree  Occupational History   Not on file  Tobacco Use   Smoking status: Never   Smokeless tobacco: Never  Vaping Use   Vaping status: Never Used  Substance and Sexual Activity   Alcohol use: Never   Drug use: Never   Sexual activity: Not Currently  Other Topics Concern   Not on file  Social History Narrative  Not on file   Social Determinants of Health   Financial Resource Strain: Not on file  Food Insecurity: Not on file  Transportation Needs: Not on file  Physical Activity: Not on file  Stress: Not on file  Social Connections: Not on file    Allergies:  Allergies  Allergen Reactions   Sumatriptan Shortness Of Breath    Facial numbness   Pseudoephedrine Hcl Other (See Comments)    manic Other reaction(s): Hallucinations   Cabbage Nausea And Vomiting    Cooked    Ibuprofen Itching    Metabolic Disorder Labs: Lab Results  Component Value Date   HGBA1C 6.2 (H) 03/13/2019   MPG 131.24 03/13/2019   Lab Results   Component Value Date   PROLACTIN 17.2 08/02/2021   No results found for: "CHOL", "TRIG", "HDL", "CHOLHDL", "VLDL", "LDLCALC" Lab Results  Component Value Date   TSH 1.231 08/02/2021   TSH 1.178 03/13/2019    Therapeutic Level Labs: No results found for: "LITHIUM" No results found for: "VALPROATE" No results found for: "CBMZ"  Current Medications: Current Outpatient Medications  Medication Sig Dispense Refill   alendronate (FOSAMAX) 70 MG tablet Take 70 mg by mouth once a week.     atorvastatin (LIPITOR) 10 MG tablet Take 1 tablet (10 mg total) by mouth daily at 6 PM. 30 tablet 1   B-D UF III MINI PEN NEEDLES 31G X 5 MM MISC Inject into the skin.     buPROPion (WELLBUTRIN XL) 150 MG 24 hr tablet Take 1 tablet (150 mg total) by mouth daily. 90 tablet 0   Continuous Glucose Receiver (FREESTYLE LIBRE 2 READER) DEVI USE 1 DEVICE AS DIRECTED TO MONITOR BLOOD SUGARS FOR DIABETES E11.649 AND E11.42     Continuous Glucose Sensor (FREESTYLE LIBRE 2 SENSOR) MISC      fenofibrate micronized (LOFIBRA) 200 MG capsule Take 200 mg by mouth daily.     fluticasone (FLONASE) 50 MCG/ACT nasal spray Place 2 sprays into both nostrils daily.     gabapentin (NEURONTIN) 300 MG capsule Take 300 mg by mouth at bedtime.     lamoTRIgine (LAMICTAL) 200 MG tablet Take 1 tablet (200 mg total) by mouth daily. Take along with 50 mg daily - total daily dose of 250 mg 90 tablet 1   lamoTRIgine (LAMICTAL) 25 MG tablet Take 1 tablet (25 mg total) by mouth 2 (two) times daily. TAKE ALONG WITH 200MG  DAILY FOR TOTAL OF 250MG  DAILY 180 tablet 1   linaclotide (LINZESS) 145 MCG CAPS capsule Take 145 mcg by mouth daily.     meloxicam (MOBIC) 15 MG tablet Take 15 mg by mouth daily.     mirtazapine (REMERON) 15 MG tablet Take 1 tablet (15 mg total) by mouth at bedtime. 90 tablet 1   montelukast (SINGULAIR) 10 MG tablet Take 1 tablet (10 mg total) by mouth at bedtime. 30 tablet 1   pantoprazole (PROTONIX) 40 MG tablet Take 40 mg  by mouth daily.     propranolol (INDERAL) 20 MG tablet Take 20 mg by mouth 2 (two) times daily.     QUEtiapine (SEROQUEL) 100 MG tablet Take 2 tablets (200 mg total) by mouth at bedtime. 180 tablet 0   RYBELSUS 3 MG TABS Take 3 mg by mouth daily.     SUMAtriptan (IMITREX) 50 MG tablet Take by mouth.     traMADol (ULTRAM) 50 MG tablet Take 50 mg by mouth every 6 (six) hours as needed.     UBRELVY 50 MG  TABS Take 1 tablet by mouth daily.     oxyCODONE-acetaminophen (PERCOCET) 10-325 MG tablet Take 1 tablet by mouth 3 (three) times daily. (Patient not taking: Reported on 12/26/2022)     traZODone (DESYREL) 100 MG tablet Take 1-2 tablets (100-200 mg total) by mouth at bedtime as needed for sleep. 90 tablet 3   No current facility-administered medications for this visit.     Musculoskeletal: Strength & Muscle Tone: within normal limits Gait & Station: normal Patient leans: N/A  Psychiatric Specialty Exam: Review of Systems  Psychiatric/Behavioral:  The patient is nervous/anxious.        Rumination    Blood pressure 112/80, pulse 75, temperature (!) 97.5 F (36.4 C), temperature source Skin, height 5\' 5"  (1.651 m), weight 205 lb (93 kg).Body mass index is 34.11 kg/m.  General Appearance: Fairly Groomed  Eye Contact:  Fair  Speech:  Clear and Coherent  Volume:  Normal  Mood:  Anxious  Affect:  Congruent  Thought Process:  Goal Directed and Descriptions of Associations: Intact  Orientation:  Full (Time, Place, and Person)  Thought Content: Rumination   Suicidal Thoughts:  No  Homicidal Thoughts:  No  Memory:  Immediate;   Fair Recent;   Fair Remote;   Fair  Judgement:  Fair  Insight:  Fair  Psychomotor Activity:  Normal  Concentration:  Concentration: Fair and Attention Span: Fair  Recall:  [employer] of Knowledge: Fair  Language: Fair  Akathisia:  No  Handed:  Ambidextrous  AIMS (if indicated): done  Assets:  Desire for Improvement Housing Social Support Transportation   ADL's:  Intact  Cognition: WNL  Sleep:  Poor   Screenings: Geneticist, molecular Office Visit from 12/26/2022 in [facility] Office Visit from 10/24/2022 in [facility] Office Visit from 07/22/2022 in [facility] Office Visit from 04/19/2022 in [facility] Office Visit from 02/14/2022 in [facility]  AIMS Total Score 0 0 0 0 0      AUDIT    Flowsheet Row Admission (Discharged) from 03/14/2019 in [facility]  Alcohol Use Disorder Identification Test Final Score (AUDIT) 0      GAD-7    Flowsheet Row Office Visit from 12/26/2022 in [facility] Office Visit from 10/24/2022 in [facility] Office Visit from 07/22/2022 in [facility] Office Visit from 04/19/2022 in [facility] Office Visit from 02/14/2022 in [facility]  Total GAD-7 Score 5 1 7 1 3       PHQ2-9    Flowsheet Row Office Visit from 12/26/2022 in [facility] Procedure visit from 12/02/2022 in [facility] Visit from 10/24/2022 in [facility] Office Visit from 07/22/2022 in [facility] Office Visit from 04/19/2022 in [facility]  PHQ-2 Total Score 1 0 0 2 0  PHQ-9 Total Score -- -- -- 5 5      Flowsheet Row Office Visit from 12/26/2022 in [facility] Office Visit from 10/24/2022 in [facility] Office Visit from  07/22/2022 in [facility]  C-SSRS RISK CATEGORY No Risk Low Risk No  Risk        Assessment and Plan: Marilyn Davis is a 67 year old Caucasian female who has a history of bipolar disorder, borderline personality disorder, autism spectrum was evaluated in office today.  Patient currently with situational stressors which does have an impact on her mood, with worsening anxiety, sleep problems, will benefit from the following plan.  Plan Bipolar disorder type I mixed mild-unstable Increase Seroquel to 200 mg p.o. daily at bedtime. Discontinue Seroquel 25 mg during the day. Continue mirtazapine 15 mg p.o. nightly Trazodone 100-200 mg p.o. nightly. Lamotrigine 200 mg p.o. daily at bedtime and 50 mg p.o. daily during the day. Wellbutrin XL 150 mg p.o. daily  Borderline personality disorder-stable Patient was referred for CBT in the past.  Autism spectrum disorder-chronic-stable Continue CBT as needed  At risk for prolonged QT syndrome-reviewed EKG dated 10/29/2022-discussed with patient-normal sinus rhythm-[id number].  Patient will benefit from labs including LFT hemoglobin A1c, prolactin, sodium, platelet count.  Patient reports she has primary care appointment coming up and will get her labs done at that visit.  Collaboration of Care: Collaboration of Care: Primary Care Provider AEB patient encouraged to follow up with primary care.  Patient/Guardian was advised Release of Information must be obtained prior to any record release in order to collaborate their care with an outside provider. Patient/Guardian was advised if they have not already done so to contact the registration department to sign all necessary forms in order for [place] to release information regarding their care.   Consent: Patient/Guardian gives verbal consent for treatment and assignment of benefits for services provided during this visit. Patient/Guardian expressed understanding and  agreed to proceed.   Follow-up in clinic in 2 months or sooner if needed.  This note was generated in part or whole with voice recognition software. Voice recognition is usually quite accurate but there are transcription errors that can and very often do occur. I apologize for any typographical errors that were not detected and corrected.    Jomarie Longs, MD 12/27/2022, 1:47 PM

## 2023-01-13 ENCOUNTER — Ambulatory Visit
Payer: Medicare HMO | Attending: Student in an Organized Health Care Education/Training Program | Admitting: Student in an Organized Health Care Education/Training Program

## 2023-01-13 ENCOUNTER — Encounter: Payer: Self-pay | Admitting: Student in an Organized Health Care Education/Training Program

## 2023-01-13 VITALS — BP 109/46 | HR 74 | Temp 97.6°F | Resp 18 | Ht 65.0 in | Wt 205.0 lb

## 2023-01-13 DIAGNOSIS — G894 Chronic pain syndrome: Secondary | ICD-10-CM | POA: Insufficient documentation

## 2023-01-13 DIAGNOSIS — M47816 Spondylosis without myelopathy or radiculopathy, lumbar region: Secondary | ICD-10-CM | POA: Diagnosis present

## 2023-01-13 NOTE — Progress Notes (Signed)
Safety precautions to be maintained throughout the outpatient stay will include: orient to surroundings, keep bed in low position, maintain call bell within reach at all times, provide assistance with transfer out of bed and ambulation.  

## 2023-01-13 NOTE — Progress Notes (Signed)
PROVIDER NOTE: Information contained herein reflects review and annotations entered in association with encounter. Interpretation of such information and data should be left to medically-trained personnel. Information provided to patient can be located elsewhere in the medical record under "Patient Instructions". Document created using STT-dictation technology, any transcriptional errors that may result from process are unintentional.    Patient: Marilyn Davis  Service Category: E/M  Provider: Edward Jolly, MD  DOB: 1955-06-11  DOS: 01/13/2023  Referring Provider: [facility]  MRN: [id number]  Specialty: Interventional Pain Management  PCP: [facility]  Type: Established Patient  Setting: Ambulatory outpatient    Location: Office  Delivery: Face-to-face     HPI  [name], a 67 y.o. year old female, is here today because of her Lumbar facet arthropathy [M47.816]. Marilyn Davis's primary complain today is Back Pain (Lumbar bilateral )  Pain Assessment: Severity of Chronic pain is reported as a 4 /10. Location: Back Lower, Left, Right/into the buttocks and hip on the right side.. Onset: More than a month ago. Quality: Constant, Aching, Sharp, Discomfort. Timing: Constant. Modifying factor(s): rest and procedures. Vitals:  height is 5\' 5"  (1.651 m) and weight is 205 lb (93 kg). Her temporal temperature is 97.6 F (36.4 C). Her blood pressure is 109/46 (abnormal) and her pulse is 74. Her respiration is 18 and oxygen saturation is 97%.  BMI: Estimated body mass index is 34.11 kg/m as calculated from the following:   Height as of this encounter: 5\' 5"  (1.651 m).   Weight as of this encounter: 205 lb (93 kg). Last encounter: 11/20/2022. Last procedure: 12/02/2022.  Reason for encounter: post-procedure evaluation and assessment.    Post-procedure evaluation   Procedure: Lumbar Facet, Medial Branch Radiofrequency Ablation (RFA) #1  Laterality:  Bilateral (-50)  Level: L3, L4, and L5 Medial Branch Level(s). These levels will denervate the L3-4 and L4-5 lumbar facet joints.  Imaging: Fluoroscopy-guided         Anesthesia: Local anesthesia (1-2% Lidocaine) Sedation: Minimal Sedation                       DOS: 12/02/2022  Performed by: Edward Jolly, MD  Purpose: Therapeutic/Palliative Indications: Low back pain severe enough to impact quality of life or function. Indications: 1. Lumbar facet arthropathy   2. Lumbar spondylosis   3. Chronic pain syndrome    Marilyn Davis has been dealing with the above chronic pain for longer than three months and has either failed to respond, was unable to tolerate, or simply did not get enough benefit from other more conservative therapies including, but not limited to: 1. Over-the-counter medications 2. Anti-inflammatory medications 3. Muscle relaxants 4. Membrane stabilizers 5. Opioids 6. Physical therapy and/or chiropractic manipulation 7. Modalities (Heat, ice, etc.) 8. Invasive techniques such as nerve blocks. Marilyn Davis has attained more than 50% relief of the pain from a series of diagnostic injections conducted in separate occasions.  Pain Score: Pre-procedure: 7 /10 Post-procedure: 3 /10     Effectiveness:  Initial hour after procedure: 100 %  Subsequent 4-6 hours post-procedure: 100 %  Analgesia past initial 6 hours: 100 % (received good pain relief til approx 2 - 2 1/2 weeks ago.  the pain gradually is returning.  took Tramadol yesterday for the first time since procedure.)  Ongoing improvement:  Analgesic:  75% Function: Somewhat improved ROM: Somewhat improved   ROS  Constitutional: Denies any fever or chills Gastrointestinal: No reported hemesis, hematochezia,  vomiting, or acute GI distress Musculoskeletal:  Mild low back pain Neurological: No reported episodes of acute onset apraxia, aphasia, dysarthria, agnosia, amnesia, paralysis, loss of coordination, or loss  of consciousness  Medication Review  FreeStyle Libre 2 Reader, FreeStyle Libre 2 Sensor, Insulin Pen Needle, QUEtiapine, SUMAtriptan, Semaglutide, Ubrogepant, alendronate, atorvastatin, buPROPion, fenofibrate micronized, fluticasone, gabapentin, lamoTRIgine, linaclotide, meloxicam, mirtazapine, montelukast, oxyCODONE-acetaminophen, pantoprazole, propranolol, traMADol, and traZODone  History Review  Allergy: Marilyn Davis is allergic to sumatriptan, pseudoephedrine hcl, cabbage, and ibuprofen. Drug: Marilyn Davis  reports no history of drug use. Alcohol:  reports no history of alcohol use. Tobacco:  reports that she has never smoked. She has never used smokeless tobacco. Social: Marilyn Davis  reports that she has never smoked. She has never used smokeless tobacco. She reports that she does not drink alcohol and does not use drugs. Medical:  has a past medical history of Bipolar disorder (HCC), Diabetes mellitus without complication (HCC), and Sciatica. Surgical: Marilyn Davis  has a past surgical history that includes Tonsillectomy; Abdominal hysterectomy; Exploratory laparotomy; Shoulder surgery; Breast biopsy (Right, 09/30/2019); and Breast biopsy (Right, 09/30/2019). Family: family history includes Alcohol abuse in her brother; Bipolar disorder in her father; Drug abuse in her brother; Personality disorder in her mother; Suicidality in an other family member.  Laboratory Chemistry Profile   Renal Lab Results  Component Value Date   BUN 18 07/29/2021   CREATININE 1.06 (H) 07/29/2021   GFRAA 51 (L) 11/11/2019   GFRNONAA 58 (L) 07/29/2021    Hepatic Lab Results  Component Value Date   AST 22 07/29/2021   ALT 26 07/29/2021   ALBUMIN 4.3 07/29/2021   ALKPHOS 26 (L) 07/29/2021   LIPASE 70 (H) 07/29/2021    Electrolytes Lab Results  Component Value Date   NA 139 07/29/2021   K 4.2 07/29/2021   CL 111 07/29/2021   CALCIUM 9.8 07/29/2021    Bone No results found for: "VD25OH",  "VD125OH2TOT", "OZ3086VH8", "IO9629BM8", "25OHVITD1", "25OHVITD2", "25OHVITD3", "TESTOFREE", "TESTOSTERONE"  Inflammation (CRP: Acute Phase) (ESR: Chronic Phase) No results found for: "CRP", "ESRSEDRATE", "LATICACIDVEN"       Note: Above Lab results reviewed.  Recent Imaging Review  DG PAIN CLINIC C-ARM 1-60 MIN NO REPORT Fluoro was used, but no Radiologist interpretation will be provided.  Please refer to "NOTES" tab for provider progress note. Note: Reviewed        Physical Exam  General appearance: Well nourished, well developed, and well hydrated. In no apparent acute distress Mental status: Alert, oriented x 3 (person, place, & time)       Respiratory: No evidence of acute respiratory distress Eyes: PERLA Vitals: BP (!) 109/46 (BP Location: Right Arm, Patient Position: Sitting, Cuff Size: Large)   Pulse 74   Temp 97.6 F (36.4 C) (Temporal)   Resp 18   Ht 5\' 5"  (1.651 m)   Wt 205 lb (93 kg)   SpO2 97%   BMI 34.11 kg/m  BMI: Estimated body mass index is 34.11 kg/m as calculated from the following:   Height as of this encounter: 5\' 5"  (1.651 m).   Weight as of this encounter: 205 lb (93 kg). Ideal: Ideal body weight: 57 kg (125 lb 10.6 oz) Adjusted ideal body weight: 71.4 kg (157 lb 6.4 oz)  Assessment   Diagnosis Status  1. Lumbar facet arthropathy   2. Lumbar spondylosis   3. Chronic pain syndrome    Controlled Controlled Controlled    Plan of Care  Overall patient is doing  well since her lumbar radiofrequency ablation.  Notes improvement in her low back pain and also improvement in functional status.  We will continue to monitor her symptoms.  I will see the patient back in the spring for follow-up.   Follow-up plan:   Return in about 3 months (around 04/29/2023) for follow up .      B/L L3,4,5 MBNB #1 09/11/22, 10/30/22, B/L L3,4,5 RFA 12/02/22      Recent Visits Date Type Provider Dept  12/02/22 Procedure visit Edward Jolly, MD [facility]   11/20/22 Office Visit Edward Jolly, MD [facility]  10/30/22 Procedure visit Edward Jolly, MD [facility]  Showing recent visits within past 90 days and meeting all other requirements Today's Visits Date Type Provider Dept  01/13/23 Office Visit Edward Jolly, MD [facility]  Showing today's visits and meeting all other requirements Future Appointments No visits were found meeting these conditions. Showing future appointments within next 90 days and meeting all other requirements  I discussed the assessment and treatment plan with the patient. The patient was provided an opportunity to ask questions and all were answered. The patient agreed with the plan and demonstrated an understanding of the instructions.  Patient advised to call back or seek an in-person evaluation if the symptoms or condition worsens.  Duration of encounter: .  Total time on encounter, as per AMA guidelines included both the face-to-face and non-face-to-face time personally spent by the physician and/or other qualified health care professional(s) on the day of the encounter (includes time in activities that require the physician or other qualified health care professional and does not include time in activities normally performed by clinical staff). Physician's time may include the following activities when performed: Preparing to see the patient (e.g., pre-charting review of records, searching for previously ordered imaging, lab work, and nerve conduction tests) Review of prior analgesic pharmacotherapies. Reviewing PMP Interpreting ordered tests (e.g., lab work, imaging, nerve conduction tests) Performing post-procedure evaluations, including interpretation of diagnostic procedures Obtaining and/or reviewing separately obtained history Performing a medically appropriate examination and/or evaluation Counseling and educating the patient/family/caregiver Ordering medications,  tests, or procedures Referring and communicating with other health care professionals (when not separately reported) Documenting clinical information in the electronic or other health record Independently interpreting results (not separately reported) and communicating results to the patient/ family/caregiver Care coordination (not separately reported)  Note by: Edward Jolly, MD Date: 01/13/2023; Time: 3:19 PM

## 2023-01-29 ENCOUNTER — Other Ambulatory Visit: Payer: Self-pay | Admitting: Internal Medicine

## 2023-01-29 DIAGNOSIS — M810 Age-related osteoporosis without current pathological fracture: Secondary | ICD-10-CM

## 2023-01-30 ENCOUNTER — Other Ambulatory Visit: Payer: Self-pay | Admitting: Internal Medicine

## 2023-01-30 DIAGNOSIS — Z1231 Encounter for screening mammogram for malignant neoplasm of breast: Secondary | ICD-10-CM

## 2023-02-26 ENCOUNTER — Encounter: Payer: Self-pay | Admitting: Psychiatry

## 2023-02-26 ENCOUNTER — Ambulatory Visit (INDEPENDENT_AMBULATORY_CARE_PROVIDER_SITE_OTHER): Payer: Medicare HMO | Admitting: Psychiatry

## 2023-02-26 VITALS — BP 126/82 | HR 85 | Temp 98.5°F | Ht 65.0 in | Wt 200.8 lb

## 2023-02-26 DIAGNOSIS — F3178 Bipolar disorder, in full remission, most recent episode mixed: Secondary | ICD-10-CM

## 2023-02-26 DIAGNOSIS — F418 Other specified anxiety disorders: Secondary | ICD-10-CM

## 2023-02-26 DIAGNOSIS — F603 Borderline personality disorder: Secondary | ICD-10-CM | POA: Diagnosis not present

## 2023-02-26 DIAGNOSIS — F840 Autistic disorder: Secondary | ICD-10-CM | POA: Diagnosis not present

## 2023-02-26 DIAGNOSIS — Z8659 Personal history of other mental and behavioral disorders: Secondary | ICD-10-CM

## 2023-02-26 DIAGNOSIS — F410 Panic disorder [episodic paroxysmal anxiety] without agoraphobia: Secondary | ICD-10-CM | POA: Insufficient documentation

## 2023-02-26 MED ORDER — DIAZEPAM 5 MG PO TABS
5.0000 mg | ORAL_TABLET | Freq: Every day | ORAL | 0 refills | Status: AC | PRN
Start: 2023-02-26 — End: ?

## 2023-02-26 MED ORDER — LAMOTRIGINE 200 MG PO TABS
200.0000 mg | ORAL_TABLET | Freq: Every day | ORAL | 1 refills | Status: DC
Start: 2023-02-26 — End: 2023-08-14

## 2023-02-26 NOTE — Progress Notes (Signed)
 BH MD OP Progress Note  02/26/2023 12:11 PM Marilyn Davis  MRN:  [id number]  Chief Complaint:  Chief Complaint  Patient presents with   Follow-up   Depression   Anxiety   Medication Refill   HPI: Marilyn Davis is a 68 year old Caucasian female married, lives in [place], who has a history of bipolar disorder, borderline personality disorder, autism spectrum, multiple medical problems including diabetes mellitus, sciatica, dizziness, tremors was evaluated in office today.  The patient reports a significant improvement in obsessive thoughts and ruminations about music, which were previously a concern. The patient attributes this improvement to an increase in Seroquel  dosage, which she wishes to maintain due to the absence of any side effects or concerns.  The patient has also started a new pescatarian diet, which includes seafood, shellfish, and vegetables, with occasional vegan meals. She reports a weight loss of 5 pounds since starting the diet.  However, the patient has been experiencing periods of decreased appetite, lasting for 2 days at a time, during which she consumes mainly liquids and small amounts of custard. These episodes have been occurring for a couple of years, but the intervals between them have been decreasing recently.  Patient agreeable to discuss this with primary care provider.  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 She underwent a procedure that provided temporary relief by burning out the nerve, and another procedure is planned for March.  Lastly, the patient mentions dental problems, including a cracked tooth causing pain. She expresses a strong fear of dental procedures, which has been a barrier to seeking dental care. The patient is considering sedation dentistry but is concerned about insurance coverage.  Patient currently denies any suicidality, homicidality or  perceptual disturbances.  Visit Diagnosis:    ICD-10-CM   1. Bipolar disorder, in full remission, most recent episode mixed (HCC)  F31.78 lamoTRIgine  (LAMICTAL ) 200 MG tablet    2. Borderline personality disorder (HCC)  F60.3     3. Autism spectrum disorder  F84.0     4. Other specified anxiety disorders  F41.8 diazepam  (VALIUM ) 5 MG tablet   limited symptom attacks    5. History of posttraumatic stress disorder (PTSD)  Z86.59       Past Psychiatric History: I have reviewed past psychiatric history from progress note on 07/31/2021.  Past Medical History:  Past Medical History:  Diagnosis Date   Bipolar disorder (HCC)    Diabetes mellitus without complication (HCC)    Sciatica     Past Surgical History:  Procedure Laterality Date   ABDOMINAL HYSTERECTOMY     BREAST BIOPSY Right 09/30/2019   9:00, 5cmfn, Q shape, neg   BREAST BIOPSY Right 09/30/2019   9:00, 9cmfn, vision, neg   EXPLORATORY LAPAROTOMY     SHOULDER SURGERY     TONSILLECTOMY      Family Psychiatric History: I have reviewed family psychiatric history from progress note on 07/31/2021.  Family History:  Family History  Problem Relation Age of Onset   Personality disorder Mother    Bipolar disorder Father    Alcohol abuse Brother    Drug abuse Brother    Suicidality Other    Breast cancer Neg Hx     Social History: I have reviewed social history from progress note on 07/31/2021. Social History   Socioeconomic History   Marital status: Married    Spouse name: johnnie   Number of children: 2  Years of education: Not on file   Highest education level: Some college, no degree  Occupational History   Not on file  Tobacco Use   Smoking status: Never   Smokeless tobacco: Never  Vaping Use   Vaping status: Never Used  Substance and Sexual Activity   Alcohol use: Never   Drug use: Never   Sexual activity: Not Currently  Other Topics Concern   Not on file  Social History Narrative   Not on file    Social Drivers of Health   Financial Resource Strain: Not on file  Food Insecurity: Not on file  Transportation Needs: Not on file  Physical Activity: Not on file  Stress: Not on file  Social Connections: Not on file    Allergies:  Allergies  Allergen Reactions   Sumatriptan Shortness Of Breath    Facial numbness   Pseudoephedrine Hcl Other (See Comments)    manic Other reaction(s): Hallucinations   Cabbage Nausea And Vomiting    Cooked    Ibuprofen Itching    Metabolic Disorder Labs: Lab Results  Component Value Date   HGBA1C 6.2 (H) 03/13/2019   MPG 131.24 03/13/2019   Lab Results  Component Value Date   PROLACTIN 17.2 08/02/2021   No results found for: CHOL, TRIG, HDL, CHOLHDL, VLDL, LDLCALC Lab Results  Component Value Date   TSH 1.231 08/02/2021   TSH 1.178 03/13/2019    Therapeutic Level Labs: No results found for: LITHIUM No results found for: VALPROATE No results found for: CBMZ  Current Medications: Current Outpatient Medications  Medication Sig Dispense Refill   alendronate (FOSAMAX) 70 MG tablet Take 70 mg by mouth once a week.     atorvastatin  (LIPITOR) 10 MG tablet Take 1 tablet (10 mg total) by mouth daily at 6 PM. 30 tablet 1   B-D UF III MINI PEN NEEDLES 31G X 5 MM MISC Inject into the skin.     Continuous Glucose Receiver (FREESTYLE LIBRE 2 READER) DEVI USE 1 DEVICE AS DIRECTED TO MONITOR BLOOD SUGARS FOR DIABETES E11.649 AND E11.42     Continuous Glucose Sensor (FREESTYLE LIBRE 2 SENSOR) MISC      diazepam  (VALIUM ) 5 MG tablet Take 1 tablet (5 mg total) by mouth daily as needed for anxiety. Take prior to dental procedure ( after consulting with dentist) 3 tablet 0   fenofibrate  micronized (LOFIBRA) 200 MG capsule Take 200 mg by mouth daily.     fluticasone (FLONASE) 50 MCG/ACT nasal spray Place 2 sprays into both nostrils daily.     gabapentin (NEURONTIN) 300 MG capsule Take 300 mg by mouth at bedtime.     lamoTRIgine   (LAMICTAL ) 25 MG tablet Take 1 tablet (25 mg total) by mouth 2 (two) times daily. TAKE ALONG WITH 200MG  DAILY FOR TOTAL OF 250MG  DAILY 180 tablet 1   linaclotide (LINZESS) 145 MCG CAPS capsule Take 145 mcg by mouth daily.     meloxicam (MOBIC) 15 MG tablet Take 15 mg by mouth daily.     mirtazapine  (REMERON ) 15 MG tablet Take 1 tablet (15 mg total) by mouth at bedtime. 90 tablet 1   montelukast  (SINGULAIR ) 10 MG tablet Take 1 tablet (10 mg total) by mouth at bedtime. 30 tablet 1   pantoprazole  (PROTONIX ) 40 MG tablet Take 40 mg by mouth daily.     propranolol (INDERAL) 20 MG tablet Take 20 mg by mouth 2 (two) times daily.     QUEtiapine  (SEROQUEL ) 100 MG tablet Take 2 tablets (200  mg total) by mouth at bedtime. 180 tablet 0   RYBELSUS  3 MG TABS Take 3 mg by mouth daily.     traZODone  (DESYREL ) 100 MG tablet Take 1-2 tablets (100-200 mg total) by mouth at bedtime as needed for sleep. 90 tablet 3   UBRELVY 50 MG TABS Take 1 tablet by mouth daily.     buPROPion  (WELLBUTRIN  XL) 150 MG 24 hr tablet Take 1 tablet (150 mg total) by mouth daily. 90 tablet 0   lamoTRIgine  (LAMICTAL ) 200 MG tablet Take 1 tablet (200 mg total) by mouth daily. Take along with 50 mg daily - total daily dose of 250 mg 90 tablet 1   oxyCODONE -acetaminophen  (PERCOCET) 10-325 MG tablet Take 1 tablet by mouth 3 (three) times daily. (Patient not taking: Reported on 02/26/2023)     traMADol  (ULTRAM ) 50 MG tablet Take 50 mg by mouth every 6 (six) hours as needed. (Patient not taking: Reported on 02/26/2023)     No current facility-administered medications for this visit.     Musculoskeletal: Strength & Muscle Tone: within normal limits Gait & Station: normal Patient leans: N/A  Psychiatric Specialty Exam: Review of Systems  Psychiatric/Behavioral: Negative.      Blood pressure 126/82, pulse 85, temperature 98.5 F (36.9 C), temperature source Temporal, height 5' 5 (1.651 m), weight 200 lb 12.8 oz (91.1 kg), SpO2 92%.Body mass  index is 33.41 kg/m.  General Appearance: Fairly Groomed  Eye Contact:  Good  Speech:  Clear and Coherent  Volume:  Normal  Mood:  Euthymic  Affect:  Congruent  Thought Process:  Goal Directed and Descriptions of Associations: Intact  Orientation:  Full (Time, Place, and Person)  Thought Content: Logical   Suicidal Thoughts:  No  Homicidal Thoughts:  No  Memory:  Immediate;   Fair Recent;   Fair Remote;   Fair  Judgement:  Fair  Insight:  Fair  Psychomotor Activity:  Normal  Concentration:  Concentration: Fair and Attention Span: Fair  Recall:  [employer] of Knowledge: Fair  Language: Fair  Akathisia:  No  Handed:  Right  AIMS (if indicated): done  Assets:  Desire for Improvement Housing Social Support  ADL's:  Intact  Cognition: WNL  Sleep:  Fair   Screenings: Midwife Visit from 02/26/2023 in [facility] Office Visit from 12/26/2022 in [facility] Office Visit from 10/24/2022 in [facility] Office Visit from 07/22/2022 in [facility] Office Visit from 04/19/2022 in [facility]  AIMS Total Score 0 0 0 0 0      AUDIT    Flowsheet Row Admission (Discharged) from 03/14/2019 in [facility]  Alcohol Use Disorder Identification Test Final Score (AUDIT) 0      GAD-7    Flowsheet Row Office Visit from 02/26/2023 in [facility] Office Visit from 12/26/2022 in [facility] Office Visit from 10/24/2022 in [facility] Office Visit from 07/22/2022 in [facility] Office Visit from 04/19/2022 in [facility]  Total GAD-7 Score 0 5 1 7 1       PHQ2-9     Flowsheet Row Office Visit from 02/26/2023 in [facility] Office Visit from 12/26/2022 in [facility] Procedure visit from  12/02/2022 in [facility] Visit from 10/24/2022 in [facility] Office Visit from 07/22/2022 in [facility]  PHQ-2 Total Score 0 1 0 0 2  PHQ-9 Total Score -- -- -- -- 5      Flowsheet Row Office Visit from 02/26/2023 in [facility] Office Visit from 12/26/2022 in [facility] Office Visit from 10/24/2022 in [facility]  C-SSRS RISK CATEGORY No Risk No Risk Low Risk        Assessment and Plan: Marilyn Davis is a 68 year old Caucasian female who has a history of bipolar disorder, borderline personality disorder, autism spectrum was evaluated in office today.  Patient currently stable on current medication regimen although reports anxiety regarding dental procedures and avoidance of dentist due to the same, discussed assessment and plan as noted below.  Bipolar Disorder in remission Bipolar disorder, well-managed on Seroquel . No depressive symptoms, suicidal ideation, or hallucinations. Benefits of current dosage include improved mood stability and reduced obsessive thoughts. No significant side effects reported. Patient prefers to maintain current dosage. - Continue Seroquel  200 mg at bedtime - Continue mirtazapine  15 mg at bedtime - Lamictal  200 mg daily at bedtime and 50 mg daily. - Wellbutrin  XL 150 mg daily  Borderline Personality Disorder-stable Borderline personality disorder. No acute issues discussed.  Post-Traumatic Stress Disorder (PTSD)-stable PTSD. No acute symptoms discussed. - Continue mirtazapine  15 mg at  bedtime. - Trazodone  100-200 mg at bedtime  Autism Spectrum Disorder-chronic Autism spectrum disorder. Engages in relaxing activities like diamond puzzles which aid focus.  Anxiety attacks-prior to dental procedures-unstable Severe dental anxiety with a history of trauma from a previous dentist. Reports a cracked tooth causing pain. Discussed Valium  for anxiety management before dental appointments. Informed about risks including addiction, confusion, withdrawal symptoms, hallucinations, and falls. Advised against combining with tramadol  or oxycodone . - Prescribe Valium  5 mg, 3 pills, to be taken 20 minutes before dental appointments - Reviewed [place] AWARxE - Advise to check with the dentist about taking Valium  before the procedure   General Health Maintenance Started pescatarian diet, lost 5 pounds since January 2nd. Difficulty exercising due to cold weather. Benefits of pescatarian diet discussed and continuation encouraged. Suggested indoor exercise options. - Encourage continuation of pescatarian diet - Encourage indoor exercise options like walking in a mall or using a treadmill  Follow-up - Schedule follow-up appointment in three months.   Collaboration of Care: Collaboration of Care: Primary Care Provider AEB patient encouraged to follow up with primary care provider for intermittent appetite changes/nausea  Patient/Guardian was advised Release of Information must be obtained prior to any record release in order to collaborate their care with an outside provider. Patient/Guardian was advised if they have not already done so to contact the registration department to sign all necessary forms in order for us  to release information regarding their care.   Consent: Patient/Guardian gives verbal consent for treatment and assignment of benefits for services provided during this visit. Patient/Guardian expressed understanding and agreed to proceed.   This note was generated in part or  whole with voice recognition software. Voice recognition is usually quite accurate but there are transcription errors that can and very often do occur. I apologize for any typographical errors that were not detected and corrected.    Ladavion Savitz, MD 02/26/2023, 12:11 PM

## 2023-02-26 NOTE — Patient Instructions (Signed)
 Diazepam Tablets What is this medication? DIAZEPAM (dye AZ e pam) treats seizures, muscle spasms or twitches. It may also be used to treat anxiety, including before a procedure. It can also be used to reduce the symptoms of alcohol withdrawal. It works by helping your nervous system calm down. It belongs to a group of medications called benzodiazepines. This medicine may be used for other purposes; ask your health care provider or pharmacist if you have questions. COMMON BRAND NAME(S): Valium What should I tell my care team before I take this medication? They need to know if you have any of these conditions: Bipolar disorder, depression, psychosis, or other mental health condition Glaucoma Kidney disease Liver disease Lung or breathing disease Myasthenia gravis Parkinson disease Seizures or a history of seizures Substance use disorder Suicidal thoughts, plans, or attempt by you or a family member An unusual or allergic reaction to diazepam, other medications, foods, dyes, or preservatives Pregnant or trying to get pregnant Breastfeeding How should I use this medication? Take this medication by mouth. Take it as directed on the prescription label. You can take it with or without food. If it upsets your stomach, take it with food. Take your doses at regular intervals. Do not take your medication more often than directed. If you have been taking this medication regularly for some time, do not suddenly stop taking it. You must gradually reduce the dose, or you may get severe side effects. Ask your care team for advice. Even after you stop taking this medication it can still affect your body for several days. A special MedGuide will be given to you by the pharmacist with each prescription and refill. Be sure to read this information carefully each time. Talk to your care team about the use of this medication in children. Special care may be needed. Overdosage: If you think you have taken too much  of this medicine contact a poison control center or emergency room at once. NOTE: This medicine is only for you. Do not share this medicine with others. What if I miss a dose? If you miss a dose, take it as soon as you can. If it is almost time for your next dose, take only that dose. Do not take double or extra doses. What may interact with this medication? Do not take this medication with any of the following: Opioid medications for cough Sodium oxybate This medication may also interact with the following: Alcohol Antacids Antihistamines for allergy, cough, and cold Certain antibiotics, such as clarithromycin, erythromycin, rifampin Certain medications for anxiety or sleep Certain medications for blood pressure, heart disease, irregular heartbeat Certain medications for depression, such as amitriptyline, fluoxetine, sertraline, tranylcypromine Certain medications for fungal infections, such as ketoconazole, itraconazole, clotrimazole Certain medications for mental health conditions Certain medications for seizures, such as carbamazepine, phenobarbital, phenytoin, primidone, valproate Cimetidine Cyclosporine Dexamethasone Local anesthetics, such as lidocaine, pramoxine, tetracaine MAOIs, such as Carbex, Eldepryl, Marplan, Nardil, and Parnate Medications that relax muscles for surgery Omeprazole Opioid medications for pain Paclitaxel Phenothiazines, such as chlorpromazine, mesoridazine, prochlorperazine, thioridazine Theophylline Warfarin This list may not describe all possible interactions. Give your health care provider a list of all the medicines, herbs, non-prescription drugs, or dietary supplements you use. Also tell them if you smoke, drink alcohol, or use illegal drugs. Some items may interact with your medicine. What should I watch for while using this medication? Visit your care team for regular checks on your progress. Tell your care team if your symptoms do not start  to  get better or if they get worse. Do not suddenly stop taking this medication. You may develop a severe reaction. Your care team will tell you how much medication to take. Long term use of this medication may cause your brain and body to depend on it. This can happen even when used as directed by your care team. You and your care team will work together to determine how long you will need to take this medication. This medication may affect your coordination, reaction time, or judgment. Do not drive or operate machinery until you know how this medication affects you. Sit up or stand slowly to reduce the risk of dizzy or fainting spells. Drinking alcohol with this medication can increase the risk of these side effects. If you are taking another medication that also causes drowsiness, you may have more side effects. Give your care team a list of all medications you use. Your care team will tell you how much medication to take. Do not take more medication than directed. Get emergency help right away if you have problems breathing or unusual sleepiness. If you or your family notice any changes in your behavior, such as new or worsening depression, thoughts of harming yourself, anxiety, other unusual or disturbing thoughts, or memory loss, call your care team right away. Talk to your care team if you wish to become pregnant or think you might be pregnant. This medication can cause serious birth defects if taken during pregnancy. Talk to your care team before breastfeeding. Changes to your treatment plan may be needed. What side effects may I notice from receiving this medication? Side effects that you should report to your care team as soon as possible: Allergic reactions--skin rash, itching, hives, swelling of the face, lips, tongue, or throat CNS depression--slow or shallow breathing, shortness of breath, feeling faint, dizziness, confusion, trouble staying awake Thoughts of suicide or self-harm, worsening  mood, feelings of depression Side effects that usually do not require medical attention (report to your care team if they continue or are bothersome): Dizziness Drowsiness Headache This list may not describe all possible side effects. Call your doctor for medical advice about side effects. You may report side effects to FDA at 1-800-FDA-1088. Where should I keep my medication? Keep out of the reach of children and pets. This medication can be abused. Keep your medication in a safe place to protect it from theft. Do not share this medication with anyone. Selling or giving away this medication is dangerous and against the law. Store at room temperature between 15 and 30 degrees C (59 and 86 degrees F). Protect from light. Keep container tightly closed. This medication may cause harm and death if it is taken by other adults, children, or pets. It is important to get rid of the medication as soon as you no longer need it, or it is expired. You can do this in two ways: Take the medication to a medication take-back program. Check with your pharmacy or law enforcement to find a location. If you cannot return the medication, check the label or package insert to see if the medication should be thrown out in the garbage or flushed down the toilet. If you are not sure, ask your care team. If it is safe to put it in the trash, pour the medication out of the container. Mix the medication with cat litter, dirt, coffee grounds, or other unwanted substance. Seal the mixture in a bag or container. Put it in the trash. NOTE:  This sheet is a summary. It may not cover all possible information. If you have questions about this medicine, talk to your doctor, pharmacist, or health care provider.  2024 Elsevier/Gold Standard (2022-04-29 00:00:00)

## 2023-04-02 ENCOUNTER — Telehealth: Payer: Self-pay | Admitting: Psychiatry

## 2023-04-02 DIAGNOSIS — F603 Borderline personality disorder: Secondary | ICD-10-CM

## 2023-04-02 MED ORDER — LAMOTRIGINE 25 MG PO TABS
75.0000 mg | ORAL_TABLET | Freq: Every day | ORAL | 1 refills | Status: DC
Start: 2023-04-02 — End: 2023-09-10

## 2023-04-02 NOTE — Telephone Encounter (Signed)
Patient spouse called stating she is feeling real down, crying from time to time, more depressed, she feels it is from being cloudy. Has seasonal problems. Taking medication appropriately. Please call to advise

## 2023-04-02 NOTE — Telephone Encounter (Signed)
Returned call to patient.  She has been experiencing low energy and increased sleepiness for about a week, with symptoms gradually worsening. There are no recent changes in her life or situational stresses, and she attributes her symptoms to the cloudy weather.  She is currently taking Lamictal, with a recent increase in dosage to 25 mg twice a day, in addition to a 200 mg daily dose. Denies  suicidal thoughts.   Increase Lamictal to 200 mg daily plus 75 mg daily (25 mg in the morning and 50 mg in the evening) - Send new prescription to [facility] - Encourage exposure to light and physical activity as adjunctive strategies - Monitor symptoms and follow up if no improvement.

## 2023-04-15 ENCOUNTER — Ambulatory Visit
Admission: RE | Admit: 2023-04-15 | Discharge: 2023-04-15 | Disposition: A | Discharge status: Home / Self Care | Payer: Medicare HMO | Source: Ambulatory Visit | Attending: Internal Medicine | Admitting: Internal Medicine

## 2023-04-15 DIAGNOSIS — M810 Age-related osteoporosis without current pathological fracture: Secondary | ICD-10-CM | POA: Diagnosis present

## 2023-04-15 DIAGNOSIS — Z1231 Encounter for screening mammogram for malignant neoplasm of breast: Secondary | ICD-10-CM | POA: Insufficient documentation

## 2023-04-30 ENCOUNTER — Encounter: Payer: Self-pay | Admitting: Student in an Organized Health Care Education/Training Program

## 2023-04-30 ENCOUNTER — Ambulatory Visit
Payer: Medicare HMO | Attending: Student in an Organized Health Care Education/Training Program | Admitting: Student in an Organized Health Care Education/Training Program

## 2023-04-30 VITALS — BP 136/66 | HR 86 | Temp 97.2°F | Resp 16 | Ht 65.5 in | Wt 200.0 lb

## 2023-04-30 DIAGNOSIS — G894 Chronic pain syndrome: Secondary | ICD-10-CM | POA: Diagnosis not present

## 2023-04-30 DIAGNOSIS — M47816 Spondylosis without myelopathy or radiculopathy, lumbar region: Secondary | ICD-10-CM

## 2023-04-30 NOTE — Patient Instructions (Signed)
 Procedure instructions  Do not eat or drink fluids (other than water) for 6 hours before your procedure  No water for 2 hours before your procedure  Take your blood pressure medicine with a sip of water  Arrive 30 minutes before your appointment  Carefully read the "Preparing for your procedure" detailed instructions  If you have questions call us at [phone]  _____________________________________________________________________    ______________________________________________________________________  Preparing for your procedure  Appointments: If you think you may not be able to keep your appointment, call 24-48 hours in advance to cancel. We need time to make it available to others.  During your procedure appointment there will be: No Prescription Refills. No disability issues to discussed. No medication changes or discussions.  Instructions: Food intake: Avoid eating anything solid for at least 8 hours prior to your procedure. Clear liquid intake: You may take clear liquids such as water up to 2 hours prior to your procedure. (No carbonated drinks. No soda.) Transportation: Unless otherwise stated by your physician, bring a driver. Morning Medicines: Except for blood thinners, take all of your other morning medications with a sip of water. Make sure to take your heart and blood pressure medicines. If your blood pressure's lower number is above 100, the case will be rescheduled. Blood thinners: Make sure to stop your blood thinners as instructed.  If you take a blood thinner, but were not instructed to stop it, call our office [phone] and ask to talk to a nurse. Not stopping a blood thinner prior to certain procedures could lead to serious complications. Diabetics on insulin: Notify the staff so that you can be scheduled 1st case in the morning. If your diabetes requires high dose insulin, take only  of your normal insulin dose the morning of the procedure and  notify the staff that you have done so. Preventing infections: Shower with an antibacterial soap the morning of your procedure.  Build-up your immune system: Take 1000 mg of Vitamin C with every meal (3 times a day) the day prior to your procedure. Antibiotics: Inform the nursing staff if you are taking any antibiotics or if you have any conditions that may require antibiotics prior to procedures. (Example: recent joint implants)   Pregnancy: If you are pregnant make sure to notify the nursing staff. Not doing so may result in injury to the fetus, including death.  Sickness: If you have a cold, fever, or any active infections, call and cancel or reschedule your procedure. Receiving steroids while having an infection may result in complications. Arrival: You must be in the facility at least 30 minutes prior to your scheduled procedure. Tardiness: Your scheduled time is also the cutoff time. If you do not arrive at least 15 minutes prior to your procedure, you will be rescheduled.  Children: Do not bring any children with you. Make arrangements to keep them home. Dress appropriately: There is always a possibility that your clothing may get soiled. Avoid long dresses. Valuables: Do not bring any jewelry or valuables.  Reasons to call and reschedule or cancel your procedure: (Following these recommendations will minimize the risk of a serious complication.) Surgeries: Avoid having procedures within 2 weeks of any surgery. (Avoid for 2 weeks before or after any surgery). Flu Shots: Avoid having procedures within 2 weeks of a flu shots or . (Avoid for 2 weeks before or after immunizations). Barium: Avoid having a procedure within 7-10 days after having had a radiological study involving the use of radiological contrast. (  Myelograms, Barium swallow or enema study). Heart attacks: Avoid any elective procedures or surgeries for the initial 6 months after a "Myocardial Infarction" (Heart Attack). Blood  thinners: It is imperative that you stop these medications before procedures. Let us know if you if you take any blood thinner.  Infection: Avoid procedures during or within two weeks of an infection (including chest colds or gastrointestinal problems). Symptoms associated with infections include: Localized redness, fever, chills, night sweats or profuse sweating, burning sensation when voiding, cough, congestion, stuffiness, runny nose, sore throat, diarrhea, nausea, vomiting, cold or Flu symptoms, recent or current infections. It is specially important if the infection is over the area that we intend to treat. Heart and lung problems: Symptoms that may suggest an active cardiopulmonary problem include: cough, chest pain, breathing difficulties or shortness of breath, dizziness, ankle swelling, uncontrolled high or unusually low blood pressure, and/or palpitations. If you are experiencing any of these symptoms, cancel your procedure and contact your primary care physician for an evaluation.  Remember:  Regular Business hours are:  Monday to Thursday 8:00 AM to 4:00 PM  Provider's Schedule: Delano Metz, MD:  Procedure days: Tuesday and Thursday 7:30 AM to 4:00 PM  Edward Jolly, MD:  Procedure days: Monday and Wednesday 7:30 AM to 4:00 PM   Radiofrequency Ablation Radiofrequency ablation is a procedure that is performed to relieve pain. The procedure is often used for back, neck, or arm pain. Radiofrequency ablation involves the use of a machine that creates radio waves to make heat. During the procedure, the heat is applied to the nerve that carries the pain signal. The heat damages the nerve and interferes with the pain signal. Pain relief usually starts about 2 weeks after the procedure and lasts for 6 months to 1 year. Tell a health care provider about: Any allergies you have. All medicines you are taking, including vitamins, herbs, eye drops, creams, and over-the-counter medicines. Any  problems you or family members have had with anesthetic medicines. Any bleeding problems you have. Any surgeries you have had. Any medical conditions you have. Whether you are pregnant or may be pregnant. What are the risks? Generally, this is a safe procedure. However, problems may occur, including: Pain or soreness at the injection site. Allergic reaction to medicines given during the procedure. Bleeding. Infection at the injection site. Damage to nerves or blood vessels. What happens before the procedure? When to stop eating and drinking Follow instructions from your health care provider about what you may eat and drink before your procedure. These may include: 8 hours before the procedure Stop eating most foods. Do not eat meat, fried foods, or fatty foods. Eat only light foods, such as toast or crackers. All liquids are okay except energy drinks and alcohol. 6 hours before the procedure Stop eating. Drink only clear liquids, such as water, clear fruit juice, black coffee, plain tea, and sports drinks. Do not drink energy drinks or alcohol. 2 hours before the procedure Stop drinking all liquids. You may be allowed to take medicine with small sips of water. If you do not follow your health care provider's instructions, your procedure may be delayed or canceled. Medicines Ask your health care provider about: Changing or stopping your regular medicines. This is especially important if you are taking diabetes medicines or blood thinners. Taking medicines such as aspirin and ibuprofen. These medicines can thin your blood. Do not take these medicines unless your health care provider tells you to take them. Taking over-the-counter medicines,  vitamins, herbs, and supplements. General instructions Ask your health care provider what steps will be taken to help prevent infection. These steps may include: Removing hair at the procedure site. Washing skin with a germ-killing soap. Taking  antibiotic medicine. If you will be going home right after the procedure, plan to have a responsible adult: Take you home from the hospital or clinic. You will not be allowed to drive. Care for you for the time you are told. What happens during the procedure?  You will be awake during the procedure. You will need to be able to talk with the health care provider during the procedure. An IV will be inserted into one of your veins. You will be given one or more of the following: A medicine to help you relax (sedative). A medicine to numb the area (local anesthetic). Your health care provider will insert a radiofrequency needle into the area to be treated. This is done with the help of fluoroscopy. A wire that carries the radio waves (electrode) will be put through the radiofrequency needle. An electrical pulse will be sent through the electrode to verify the correct nerve that is causing your pain. You will feel a tingling sensation, and you may have muscle twitching. The tissue around the needle tip will be heated by an electric current that comes from the radiofrequency machine. This will numb the nerves. The needle will be removed. A bandage (dressing) will be put on the insertion area. The procedure may vary among health care providers and hospitals. What happens after the procedure? Your blood pressure, heart rate, breathing rate, and blood oxygen level will be monitored until you leave the hospital or clinic. Return to your normal activities as told by your health care provider. Ask your health care provider what activities are safe for you. If you were given a sedative during the procedure, it can affect you for several hours. Do not drive or operate machinery until your health care provider says that it is safe. Summary Radiofrequency ablation is a procedure that is performed to relieve pain. The procedure is often used for back, neck, or arm pain. Radiofrequency ablation involves the  use of a machine that creates radio waves to make heat. Plan to have a responsible adult take you home from the hospital or clinic. Do not drive or operate machinery until your health care provider says that it is safe. Return to your normal activities as told by your health care provider. Ask your health care provider what activities are safe for you. This information is not intended to replace advice given to you by your health care provider. Make sure you discuss any questions you have with your health care provider. Document Revised: 07/25/2020 Document Reviewed: 07/25/2020 Elsevier Patient Education  2024 [employer].

## 2023-04-30 NOTE — Progress Notes (Signed)
 Safety precautions to be maintained throughout the outpatient stay will include: orient to surroundings, keep bed in low position, maintain call bell within reach at all times, provide assistance with transfer out of bed and ambulation.

## 2023-04-30 NOTE — Progress Notes (Signed)
 PROVIDER NOTE: Information contained herein reflects review and annotations entered in association with encounter. Interpretation of such information and data should be left to medically-trained personnel. Information provided to patient can be located elsewhere in the medical record under "Patient Instructions". Document created using STT-dictation technology, any transcriptional errors that may result from process are unintentional.    Patient: Marilyn Davis  Service Category: E/M  Provider: Edward Jolly, MD  DOB: 06-14-1955  DOS: 04/30/2023  Referring Provider: [facility]  MRN: [id number]  Specialty: Interventional Pain Management  PCP: [facility]  Type: Established Patient  Setting: Ambulatory outpatient    Location: Office  Delivery: Face-to-face     HPI  Marilyn Davis, a 68 y.o. year old female, is here today because of her Lumbar facet arthropathy [M47.816]. Marilyn Davis's primary complain today is Back Pain (Bilateral lumbar )  Pain Assessment: Severity of Chronic pain is reported as a 6 /10. Location: Back Lower, Left, Right/stabbing down the right leg. Onset: More than a month ago. Quality: Discomfort, Aching, Shooting, Constant. Timing: Constant. Modifying factor(s): RFA helped for just about 3 months and rest. Vitals:  height is 5' 5.5" (1.664 m) and weight is 200 lb (90.7 kg). Her temporal temperature is 97.2 F (36.2 C) (abnormal). Her blood pressure is 136/66 and her pulse is 86. Her respiration is 16 and oxygen saturation is 95%.  BMI: Estimated body mass index is 32.78 kg/m as calculated from the following:   Height as of this encounter: 5' 5.5" (1.664 m).   Weight as of this encounter: 200 lb (90.7 kg). Last encounter: 01/13/2023. Last procedure: 12/02/2022.  Reason for encounter: patient-requested evaluation.   Patient is experiencing return of axial low back pain due to lumbar facet arthropathy. She is s/p bilateral  lumbar RFA 12/02/22 at L3, L4, L5 that provided her with 80% pain relief for approx 5 months. During this time she was much more functional, completing ADLs more comfortably, and also improvement in her sleep during that time, She is interested in repeating L-RFA which we can do every 6 months. We can repeat bilateral L3,4,5 RFA anytime after 06/02/23    ROS  Constitutional: Denies any fever or chills Gastrointestinal: No reported hemesis, hematochezia, vomiting, or acute GI distress Musculoskeletal:  axial low back pain Neurological: No reported episodes of acute onset apraxia, aphasia, dysarthria, agnosia, amnesia, paralysis, loss of coordination, or loss of consciousness  Medication Review  FreeStyle Libre 2 Reader, FreeStyle Libre 2 Sensor, Insulin Pen Needle, QUEtiapine, Semaglutide, Ubrogepant, alendronate, atorvastatin, buPROPion, diazepam, fenofibrate micronized, fluticasone, gabapentin, lamoTRIgine, linaclotide, meloxicam, mirtazapine, montelukast, oxyCODONE-acetaminophen, pantoprazole, propranolol, traMADol, and traZODone  History Review  Allergy: Marilyn Davis is allergic to sumatriptan, pseudoephedrine hcl, cabbage, and ibuprofen. Drug: Marilyn Davis  reports no history of drug use. Alcohol:  reports no history of alcohol use. Tobacco:  reports that she has never smoked. She has never used smokeless tobacco. Social: Marilyn Davis  reports that she has never smoked. She has never used smokeless tobacco. She reports that she does not drink alcohol and does not use drugs. Medical:  has a past medical history of Bipolar disorder (HCC), Diabetes mellitus without complication (HCC), and Sciatica. Surgical: Marilyn Davis  has a past surgical history that includes Tonsillectomy; Abdominal hysterectomy; Exploratory laparotomy; Shoulder surgery; Breast biopsy (Right, 09/30/2019); and Breast biopsy (Right, 09/30/2019). Family: family history includes Alcohol abuse in her brother; Bipolar  disorder in her father; Drug abuse in her brother; Personality disorder in her  mother; Suicidality in an other family member.  Laboratory Chemistry Profile   Renal Lab Results  Component Value Date   BUN 18 07/29/2021   CREATININE 1.06 (H) 07/29/2021   GFRAA 51 (L) 11/11/2019   GFRNONAA 58 (L) 07/29/2021    Hepatic Lab Results  Component Value Date   AST 22 07/29/2021   ALT 26 07/29/2021   ALBUMIN 4.3 07/29/2021   ALKPHOS 26 (L) 07/29/2021   LIPASE 70 (H) 07/29/2021    Electrolytes Lab Results  Component Value Date   NA 139 07/29/2021   K 4.2 07/29/2021   CL 111 07/29/2021   CALCIUM 9.8 07/29/2021    Bone No results found for: "VD25OH", "VD125OH2TOT", "WU9811BJ4", "NW2956OZ3", "25OHVITD1", "25OHVITD2", "25OHVITD3", "TESTOFREE", "TESTOSTERONE"  Inflammation (CRP: Acute Phase) (ESR: Chronic Phase) No results found for: "CRP", "ESRSEDRATE", "LATICACIDVEN"       Note: Above Lab results reviewed.  Recent Imaging Review  MM 3D SCREENING MAMMOGRAM BILATERAL BREAST CLINICAL DATA:  Screening.  EXAM: DIGITAL SCREENING BILATERAL MAMMOGRAM WITH TOMOSYNTHESIS AND CAD  TECHNIQUE: Bilateral screening digital craniocaudal and mediolateral oblique mammograms were obtained. Bilateral screening digital breast tomosynthesis was performed. The images were evaluated with computer-aided detection.  COMPARISON:  Previous exam(s).  ACR Breast Density Category b: There are scattered areas of fibroglandular density.  FINDINGS: There are no findings suspicious for malignancy.  IMPRESSION: No mammographic evidence of malignancy. A result letter of this screening mammogram will be mailed directly to the patient.  RECOMMENDATION: Screening mammogram in one year. (Code:SM-B-01Y)  BI-RADS CATEGORY  1: Negative.  Electronically Signed   By: Amie Portland M.D.   On: 04/17/2023 15:23  CLINICAL DATA:  Low back pain.  Bilateral leg pain.   EXAM: MRI LUMBAR SPINE WITHOUT AND WITH  CONTRAST   TECHNIQUE: Multiplanar and multiecho pulse sequences of the lumbar spine were obtained without and with intravenous contrast.   CONTRAST:  9mL GADAVIST GADOBUTROL 1 MMOL/ML IV SOLN   COMPARISON:  None Available.   FINDINGS: Segmentation:  Standard.   Alignment: Slight (grade 1) anterolisthesis of L4 on L5. Otherwise, normal.   Vertebrae: No focal marrow edema to suggest acute fracture or discitis/osteomyelitis. No suspicious bone lesion. No pathologic enhancement.   Conus medullaris and cauda equina: Conus extends to the L2 level. Conus appears normal. No pathologic enhancement.   Paraspinal and other soft tissues: Negative.   Disc levels:   T12-L1: No significant disc protrusion, foraminal stenosis, or canal stenosis.   L1-L2: No significant disc protrusion, foraminal stenosis, or canal stenosis.   L2-L3: No significant disc protrusion, foraminal stenosis, or canal stenosis.   L3-L4: Slight disc bulging. Facet arthropathy No significant stenosis.   L4-L5: Mild uncovering of the disc. Facet arthropathy No significant stenosis.   L5-S1: Facet arthropathy. Slight disc bulging. No significant stenosis.   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 Facet arthropathy is greatest at L5-S1.    Note: Reviewed        Physical Exam  General appearance: Well nourished, well developed, and well hydrated. In no apparent acute distress Mental status: Alert, oriented x 3 (person, place, & time)       Respiratory: No evidence of acute respiratory distress Eyes: PERLA Vitals: BP 136/66 (BP Location: Right Arm, Patient Position: Sitting, Cuff Size: Normal)   Pulse 86   Temp (!) 97.2 F (36.2 C) (Temporal)   Resp 16   Ht 5' 5.5" (1.664 m)   Wt 200 lb (90.7 kg)   SpO2 95%  BMI 32.78 kg/m  BMI: Estimated body mass index is 32.78 kg/m as calculated from the following:   Height as of this encounter: 5' 5.5" (1.664 m).   Weight  as of this encounter: 200 lb (90.7 kg). Ideal: Ideal body weight: 58.2 kg (128 lb 3.2 oz) Adjusted ideal body weight: 71.2 kg (156 lb 14.7 oz)  Lumbar Spine Area Exam  Skin & Axial Inspection: No masses, redness, or swelling Alignment: Symmetrical Functional ROM: Pain restricted ROM affecting both sides Stability: No instability detected Muscle Tone/Strength: Functionally intact. No obvious neuro-muscular anomalies detected. Sensory (Neurological): Musculoskeletal pain pattern- lumbar facet mediated Palpation: Complains of area being tender to palpation       Provocative Tests: Hyperextension/rotation test: (+) bilaterally for facet joint pain. Lumbar quadrant test (Kemp's test): (+) bilaterally for facet joint pain.  Gait & Posture Assessment  Ambulation: Unassisted Gait: Relatively normal for age and body habitus Posture: WNL  Lower Extremity Exam    Side: Right lower extremity  Side: Left lower extremity  Stability: No instability observed          Stability: No instability observed          Skin & Extremity Inspection: Skin color, temperature, and hair growth are WNL. No peripheral edema or cyanosis. No masses, redness, swelling, asymmetry, or associated skin lesions. No contractures.  Skin & Extremity Inspection: Skin color, temperature, and hair growth are WNL. No peripheral edema or cyanosis. No masses, redness, swelling, asymmetry, or associated skin lesions. No contractures.  Functional ROM: Unrestricted ROM                  Functional ROM: Unrestricted ROM                  Muscle Tone/Strength: Functionally intact. No obvious neuro-muscular anomalies detected.  Muscle Tone/Strength: Functionally intact. No obvious neuro-muscular anomalies detected.  Sensory (Neurological): Unimpaired        Sensory (Neurological): Unimpaired        DTR: Patellar: deferred today Achilles: deferred today Plantar: deferred today  DTR: Patellar: deferred today Achilles: deferred  today Plantar: deferred today  Palpation: No palpable anomalies  Palpation: No palpable anomalies    Assessment   Diagnosis  1. Lumbar facet arthropathy   2. Lumbar spondylosis   3. Chronic pain syndrome        Plan of Care  Repeat bilateral L3,4,5 RFA after 06/02/23  Orders Placed This Encounter  Procedures   Radiofrequency,Lumbar    Standing Status:   Future    Expected Date:   06/04/2023    Expiration Date:   07/31/2023    Scheduling Instructions:     Side(s): Bilateral     Level(s): L3, L4, L5, Medial Branch Nerve(s)     Sedation: With Sedation     Scheduling Timeframe: As soon as pre-approved    Where will this procedure be performed?:   [facility]   Follow-up plan:   Return in about 5 weeks (around 06/04/2023) for B/L L3, 4, 5 RFA, in clinic IV Versed .      B/L L3,4,5 MBNB #1 09/11/22, 10/30/22, B/L L3,4,5 RFA 12/02/22     Recent Visits No visits were found meeting these conditions. Showing recent visits within past 90 days and meeting all other requirements Today's Visits Date Type Provider Dept  04/30/23 Office Visit Edward Jolly, MD [facility]  Showing today's visits and meeting all other requirements Future Appointments No visits were found meeting these conditions. Showing  future appointments within next 90 days and meeting all other requirements  I discussed the assessment and treatment plan with the patient. The patient was provided an opportunity to ask questions and all were answered. The patient agreed with the plan and demonstrated an understanding of the instructions.  Patient advised to call back or seek an in-person evaluation if the symptoms or condition worsens.  Duration of encounter: .  Total time on encounter, as per AMA guidelines included both the face-to-face and non-face-to-face time personally spent by the physician and/or other qualified health care professional(s) on the day of the encounter (includes  time in activities that require the physician or other qualified health care professional and does not include time in activities normally performed by clinical staff). Physician's time may include the following activities when performed: Preparing to see the patient (e.g., pre-charting review of records, searching for previously ordered imaging, lab work, and nerve conduction tests) Review of prior analgesic pharmacotherapies. Reviewing PMP Interpreting ordered tests (e.g., lab work, imaging, nerve conduction tests) Performing post-procedure evaluations, including interpretation of diagnostic procedures Obtaining and/or reviewing separately obtained history Performing a medically appropriate examination and/or evaluation Counseling and educating the patient/family/caregiver Ordering medications, tests, or procedures Referring and communicating with other health care professionals (when not separately reported) Documenting clinical information in the electronic or other health record Independently interpreting results (not separately reported) and communicating results to the patient/ family/caregiver Care coordination (not separately reported)  Note by: Edward Jolly, MD Date: 04/30/2023; Time: 3:07 PM

## 2023-05-06 ENCOUNTER — Telehealth: Payer: Self-pay

## 2023-05-06 DIAGNOSIS — F3174 Bipolar disorder, in full remission, most recent episode manic: Secondary | ICD-10-CM

## 2023-05-06 DIAGNOSIS — F3161 Bipolar disorder, current episode mixed, mild: Secondary | ICD-10-CM

## 2023-05-06 NOTE — Telephone Encounter (Signed)
 received fax requesting refills on the quetiapine and the buproprion. pt last seen on 1-8 next appt 4-7

## 2023-05-07 MED ORDER — QUETIAPINE FUMARATE 100 MG PO TABS: 200.0000 mg | ORAL_TABLET | Freq: Every day | ORAL | 1 refills | Status: DC

## 2023-05-07 MED ORDER — BUPROPION HCL ER (XL) 150 MG PO TB24: 150.0000 mg | ORAL_TABLET | Freq: Every day | ORAL | 1 refills | Status: DC

## 2023-05-07 NOTE — Telephone Encounter (Signed)
 I have sent Seroquel and bupropion to pharmacy at [facility].

## 2023-05-08 NOTE — Telephone Encounter (Signed)
 notified pt husband pt was present. medications was sent to the pharmacy

## 2023-05-21 ENCOUNTER — Telehealth: Payer: Self-pay

## 2023-05-21 NOTE — Telephone Encounter (Signed)
 The patients husband wants you to write a letter of medical necessity for me to fax to [facility] to get the RFA approved.

## 2023-05-22 ENCOUNTER — Encounter: Payer: Self-pay | Admitting: Student in an Organized Health Care Education/Training Program

## 2023-05-26 ENCOUNTER — Telehealth: Payer: Self-pay

## 2023-05-26 ENCOUNTER — Ambulatory Visit: Payer: Self-pay | Admitting: Psychiatry

## 2023-05-26 DIAGNOSIS — F3174 Bipolar disorder, in full remission, most recent episode manic: Secondary | ICD-10-CM

## 2023-05-26 DIAGNOSIS — F603 Borderline personality disorder: Secondary | ICD-10-CM

## 2023-05-26 MED ORDER — TRAZODONE HCL 100 MG PO TABS: 100.0000 mg | ORAL_TABLET | Freq: Every evening | ORAL | 3 refills | Status: DC | PRN

## 2023-05-26 NOTE — Telephone Encounter (Signed)
 pt husband left a message that they need the trazodone sent to the [facility]. called pharmacy that rx was sent too and they don't have the patient on file. no rx. pt was last seen on 1-8next appt 6-5

## 2023-05-26 NOTE — Telephone Encounter (Signed)
 received fax requesting a refill on the mirtazpine. pt was last seen on 1-8 next appt 6-5

## 2023-05-26 NOTE — Telephone Encounter (Signed)
 Medication rx was not active

## 2023-05-26 NOTE — Telephone Encounter (Signed)
I have sent trazodone to [facility].

## 2023-05-27 MED ORDER — MIRTAZAPINE 15 MG PO TABS: 15.0000 mg | ORAL_TABLET | Freq: Every day | ORAL | 1 refills | Status: DC

## 2023-05-27 NOTE — Telephone Encounter (Signed)
I have sent mirtazapine to pharmacy.

## 2023-05-28 NOTE — Telephone Encounter (Signed)
 Spouse notified

## 2023-07-16 ENCOUNTER — Ambulatory Visit
Attending: Student in an Organized Health Care Education/Training Program | Admitting: Student in an Organized Health Care Education/Training Program

## 2023-07-16 ENCOUNTER — Encounter: Payer: Self-pay | Admitting: Student in an Organized Health Care Education/Training Program

## 2023-07-16 ENCOUNTER — Ambulatory Visit
Admission: RE | Admit: 2023-07-16 | Discharge: 2023-07-16 | Disposition: A | Discharge status: Home / Self Care | Source: Ambulatory Visit | Attending: Student in an Organized Health Care Education/Training Program | Admitting: Student in an Organized Health Care Education/Training Program

## 2023-07-16 DIAGNOSIS — G894 Chronic pain syndrome: Secondary | ICD-10-CM | POA: Insufficient documentation

## 2023-07-16 DIAGNOSIS — M47816 Spondylosis without myelopathy or radiculopathy, lumbar region: Secondary | ICD-10-CM | POA: Insufficient documentation

## 2023-07-16 MED ORDER — DEXAMETHASONE SODIUM PHOSPHATE 10 MG/ML IJ SOLN
20.0000 mg | Freq: Once | INTRAMUSCULAR | Status: AC
  Administered 2023-07-16: 20 mg
  Filled 2023-07-16: qty 2

## 2023-07-16 MED ORDER — LACTATED RINGERS IV SOLN: Freq: Once | INTRAVENOUS | Status: AC

## 2023-07-16 MED ORDER — MIDAZOLAM HCL 2 MG/2ML IJ SOLN
0.5000 mg | Freq: Once | INTRAMUSCULAR | Status: AC
  Administered 2023-07-16: 2 mg via INTRAVENOUS
  Filled 2023-07-16: qty 2

## 2023-07-16 MED ORDER — LIDOCAINE HCL 2 % IJ SOLN
20.0000 mL | Freq: Once | INTRAMUSCULAR | Status: AC
  Administered 2023-07-16: 400 mg
  Filled 2023-07-16: qty 40

## 2023-07-16 MED ORDER — ROPIVACAINE HCL 2 MG/ML IJ SOLN
18.0000 mL | Freq: Once | INTRAMUSCULAR | Status: AC
  Administered 2023-07-16: 18 mL via PERINEURAL
  Filled 2023-07-16: qty 20

## 2023-07-16 NOTE — Patient Instructions (Signed)

## 2023-07-16 NOTE — Progress Notes (Signed)
 PROVIDER NOTE: Interpretation of information contained herein should be left to medically-trained personnel. Specific patient instructions are provided elsewhere under "Patient Instructions" section of medical record. This document was created in part using STT-dictation technology, any transcriptional errors that may result from this process are unintentional.  Patient: Marilyn Davis Type: Established DOB: 1955-09-16 MRN: [id number] [facility]: [facility]  Service: Procedure DOS: 07/16/2023 Setting: Ambulatory Location: Ambulatory outpatient facility Delivery: Face-to-face Provider: Cephus Collin, MD Specialty: Interventional Pain Management Specialty designation: 09 Location: Outpatient facility Ref. Prov.: Cephus Collin, MD       Interventional Therapy   Procedure: Lumbar Facet, Medial Branch Radiofrequency Ablation (RFA) #2  Laterality: Bilateral (-50)  Level: L3, L4, and L5 Medial Branch Level(s). These levels will denervate the L3-4 and L4-5 lumbar facet joints.  Imaging: Fluoroscopy-guided         Anesthesia: Local anesthesia (1-2% Lidocaine ) Sedation: Minimal Sedation                       DOS: 07/16/2023  Performed by: Cephus Collin, MD  Purpose: Therapeutic/Palliative Indications: Low back pain severe enough to impact quality of life or function. Indications: 1. Lumbar facet arthropathy   2. Lumbar spondylosis   3. Chronic pain syndrome    Marilyn Davis has been dealing with the above chronic pain for longer than three months and has either failed to respond, was unable to tolerate, or simply did not get enough benefit from other more conservative therapies including, but not limited to: 1. Over-the-counter medications 2. Anti-inflammatory medications 3. Muscle relaxants 4. Membrane stabilizers 5. Opioids 6. Physical therapy and/or chiropractic manipulation 7. Modalities (Heat, ice, etc.) 8. Invasive techniques such as nerve blocks. Marilyn Davis  has attained more than 50% relief of the pain from a series of diagnostic injections conducted in separate occasions.  Pain Score: Pre-procedure: 5 /10 Post-procedure: 3 /10     Position / Prep / Materials:  Position: Prone  Prep solution: DuraPrep (Iodine Povacrylex [0.7% available iodine] and Isopropyl Alcohol, 74% w/w) Prep Area: Entire Lumbosacral Region (Lower back from mid-thoracic region to end of tailbone and from flank to flank.) Materials:  Tray: RFA (Radiofrequency) tray Needle(s):  Type: RFA (Teflon-coated radiofrequency ablation needles) Gauge (G): 22  Length: Regular (10cm) Qty: 2      H&P (Pre-op Assessment):  Marilyn Davis is a 68 y.o. (year old), female patient, seen today for interventional treatment. She  has a past surgical history that includes Tonsillectomy; Abdominal hysterectomy; Exploratory laparotomy; Shoulder surgery; Breast biopsy (Right, 09/30/2019); and Breast biopsy (Right, 09/30/2019). Marilyn Davis has a current medication list which includes the following prescription(s): alendronate, atorvastatin , b-d uf iii mini pen needles, bupropion , freestyle libre 2 reader, freestyle libre 2 sensor, diazepam , fenofibrate  micronized, fluticasone, gabapentin, lamotrigine , lamotrigine , meloxicam, mirtazapine , montelukast , oxycodone -acetaminophen , pantoprazole , propranolol, quetiapine , rybelsus , tramadol , trazodone , ubrelvy, and linaclotide. Her primarily concern today is the Back Pain  Initial Vital Signs:  Pulse/HCG Rate: 80ECG Heart Rate: 82 Temp: 97.7 F (36.5 C) Resp: 16 BP: (!) 125/45 SpO2: 96 %  BMI: Estimated body mass index is 32.78 kg/m as calculated from the following:   Height as of this encounter: 5' 5.5" (1.664 m).   Weight as of this encounter: 200 lb (90.7 kg).  Risk Assessment: Allergies: Reviewed. She is allergic to sumatriptan, pseudoephedrine hcl, cabbage, and ibuprofen.  Allergy Precautions: None required Coagulopathies: Reviewed. None  identified.  Blood-thinner therapy: None at this time Active Infection(s): Reviewed. None identified. Marilyn Davis is  afebrile  Site Confirmation: Marilyn Davis was asked to confirm the procedure and laterality before marking the site Procedure checklist: Completed Consent: Before the procedure and under the influence of no sedative(s), amnesic(s), or anxiolytics, the patient was informed of the treatment options, risks and possible complications. To fulfill our ethical and legal obligations, as recommended by the American Medical Association's Code of Ethics, I have informed the patient of my clinical impression; the nature and purpose of the treatment or procedure; the risks, benefits, and possible complications of the intervention; the alternatives, including doing nothing; the risk(s) and benefit(s) of the alternative treatment(s) or procedure(s); and the risk(s) and benefit(s) of doing nothing. The patient was provided information about the general risks and possible complications associated with the procedure. These may include, but are not limited to: failure to achieve desired goals, infection, bleeding, organ or nerve damage, allergic reactions, paralysis, and death. In addition, the patient was informed of those risks and complications associated to Spine-related procedures, such as failure to decrease pain; infection (i.e.: Meningitis, epidural or intraspinal abscess); bleeding (i.e.: epidural hematoma, subarachnoid hemorrhage, or any other type of intraspinal or peri-dural bleeding); organ or nerve damage (i.e.: Any type of peripheral nerve, nerve root, or spinal cord injury) with subsequent damage to sensory, motor, and/or autonomic systems, resulting in permanent pain, numbness, and/or weakness of one or several areas of the body; allergic reactions; (i.e.: anaphylactic reaction); and/or death. Furthermore, the patient was informed of those risks and complications associated with the  medications. These include, but are not limited to: allergic reactions (i.e.: anaphylactic or anaphylactoid reaction(s)); adrenal axis suppression; blood sugar elevation that in diabetics may result in ketoacidosis or comma; water retention that in patients with history of congestive heart failure may result in shortness of breath, pulmonary edema, and decompensation with resultant heart failure; weight gain; swelling or edema; medication-induced neural toxicity; particulate matter embolism and blood vessel occlusion with resultant organ, and/or nervous system infarction; and/or aseptic necrosis of one or more joints. Finally, the patient was informed that Medicine is not an exact science; therefore, there is also the possibility of unforeseen or unpredictable risks and/or possible complications that may result in a catastrophic outcome. The patient indicated having understood very clearly. We have given the patient no guarantees and we have made no promises. Enough time was given to the patient to ask questions, all of which were answered to the patient's satisfaction. Ms. Addis has indicated that she wanted to continue with the procedure. Attestation: I, the ordering provider, attest that I have discussed with the patient the benefits, risks, side-effects, alternatives, likelihood of achieving goals, and potential problems during recovery for the procedure that I have provided informed consent. Date  Time: 07/16/2023  9:24 AM   Pre-Procedure Preparation:  Monitoring: As per clinic protocol. Respiration, ETCO2, SpO2, BP, heart rate and rhythm monitor placed and checked for adequate function Safety Precautions: Patient was assessed for positional comfort and pressure points before starting the procedure. Time-out: I initiated and conducted the "Time-out" before starting the procedure, as per protocol. The patient was asked to participate by confirming the accuracy of the "Time Out" information.  Verification of the correct person, site, and procedure were performed and confirmed by me, the nursing staff, and the patient. "Time-out" conducted as per Joint Commission's Universal Protocol (UP.01.01.01). Time: 1004 Start Time: 1004 hrs.  Description of Procedure:          Laterality: See above. Levels:  See above. Safety Precautions: Aspiration looking for  blood return was conducted prior to all injections. At no point did we inject any substances, as a needle was being advanced. Before injecting, the patient was told to immediately notify me if she was experiencing any new onset of "ringing in the ears, or metallic taste in the mouth". No attempts were made at seeking any paresthesias. Safe injection practices and needle disposal techniques used. Medications properly checked for expiration dates. SDV (single dose vial) medications used. After the completion of the procedure, all disposable equipment used was discarded in the proper designated medical waste containers. Local Anesthesia: Protocol guidelines were followed. The patient was positioned over the fluoroscopy table. The area was prepped in the usual manner. The time-out was completed. The target area was identified using fluoroscopy. A 12-in long, straight, sterile hemostat was used with fluoroscopic guidance to locate the targets for each level blocked. Once located, the skin was marked with an approved surgical skin marker. Once all sites were marked, the skin (epidermis, dermis, and hypodermis), as well as deeper tissues (fat, connective tissue and muscle) were infiltrated with a small amount of a short-acting local anesthetic, loaded on a 10cc syringe with a 25G, 1.5-in  Needle. An appropriate amount of time was allowed for local anesthetics to take effect before proceeding to the next step. Technical description of process:  Radiofrequency Ablation (RFA)  L3 Medial Branch Nerve RFA: The target area for the L3 medial branch is at the  junction of the postero-lateral aspect of the superior articular process and the superior, posterior, and medial edge of the transverse process of L4. Under fluoroscopic guidance, a Radiofrequency needle was inserted until contact was made with os over the superior postero-lateral aspect of the pedicular shadow (target area). Sensory and motor testing was conducted to properly adjust the position of the needle. Once satisfactory placement of the needle was achieved, the numbing solution was slowly injected after negative aspiration for blood. 2.0 mL of the nerve block solution was injected without difficulty or complication. After waiting for at least 3 minutes, the ablation was performed. Once completed, the needle was removed intact. L4 Medial Branch Nerve RFA: The target area for the L4 medial branch is at the junction of the postero-lateral aspect of the superior articular process and the superior, posterior, and medial edge of the transverse process of L5. Under fluoroscopic guidance, a Radiofrequency needle was inserted until contact was made with os over the superior postero-lateral aspect of the pedicular shadow (target area). Sensory and motor testing was conducted to properly adjust the position of the needle. Once satisfactory placement of the needle was achieved, the numbing solution was slowly injected after negative aspiration for blood. 2.0 mL of the nerve block solution was injected without difficulty or complication. After waiting for at least 3 minutes, the ablation was performed. Once completed, the needle was removed intact. L5 Medial Branch Nerve RFA: The target area for the L5 medial branch is at the junction of the postero-lateral aspect of the superior articular process of S1 and the superior, posterior, and medial edge of the sacral ala. Under fluoroscopic guidance, a Radiofrequency needle was inserted until contact was made with os over the superior postero-lateral aspect of the pedicular  shadow (target area). Sensory and motor testing was conducted to properly adjust the position of the needle. Once satisfactory placement of the needle was achieved, the numbing solution was slowly injected after negative aspiration for blood. 2.0 mL of the nerve block solution was injected without difficulty or complication.  After waiting for at least 3 minutes, the ablation was performed. Once completed, the needle was removed intact.  Radiofrequency lesioning (ablation):  Radiofrequency Generator: Medtronic AccurianTM AG 1000 RF Generator Sensory Stimulation Parameters: 50 Hz was used to locate & identify the nerve, making sure that the needle was positioned such that there was no sensory stimulation below 0.3 V or above 0.7 V. Motor Stimulation Parameters: 2 Hz was used to evaluate the motor component. Care was taken not to lesion any nerves that demonstrated motor stimulation of the lower extremities at an output of less than 2.5 times that of the sensory threshold, or a maximum of 2.0 V. Lesioning Technique Parameters: Standard Radiofrequency settings. (Not bipolar or pulsed.) Temperature Settings: 80 degrees C Lesioning time: 60 seconds Stationary intra-operative compliance: Compliant  Once the entire procedure was completed, the treated area was cleaned, making sure to leave some of the prepping solution back to take advantage of its long term bactericidal properties.    Illustration of the posterior view of the lumbar spine and the posterior neural structures. Laminae of L2 through S1 are labeled. DPRL5, dorsal primary ramus of L5; DPRS1, dorsal primary ramus of S1; DPR3, dorsal primary ramus of L3; FJ, facet (zygapophyseal) joint L3-L4; I, inferior articular process of L4; LB1, lateral branch of dorsal primary ramus of L1; IAB, inferior articular branches from L3 medial branch (supplies L4-L5 facet joint); IBP, intermediate branch plexus; MB3, medial branch of dorsal primary ramus of L3; NR3,  third lumbar nerve root; S, superior articular process of L5; SAB, superior articular branches from L4 (supplies L4-5 facet joint also); TP3, transverse process of L3.  Facet Joint Innervation (* possible contribution)  L1-2 T12, L1 (L2*)  Medial Branch  L2-3 L1, L2 (L3*)         "          "  L3-4 L2, L3 (L4*)         "          "  L4-5 L3, L4 (L5*)         "          "  L5-S1 L4, L5, S1          "          "    Vitals:   07/16/23 1015 07/16/23 1020 07/16/23 1024 07/16/23 1030  BP: (!) 116/50 127/72 133/77 (!) 103/56  Pulse:    73  Resp: 16 12 16 16   Temp:      SpO2: 94% 95% 95% 96%  Weight:      Height:        Start Time: 1004 hrs. End Time: 1023 hrs.  Imaging Guidance (Spinal):          Type of Imaging Technique: Fluoroscopy Guidance (Spinal) Indication(s): Assistance in needle guidance and placement for procedures requiring needle placement in or near specific anatomical locations not easily accessible without such assistance. Exposure Time: Please see nurses notes. Contrast: None used. Fluoroscopic Guidance: I was personally present during the use of fluoroscopy. "Tunnel Vision Technique" used to obtain the best possible view of the target area. Parallax error corrected before commencing the procedure. "Direction-depth-direction" technique used to introduce the needle under continuous pulsed fluoroscopy. Once target was reached, antero-posterior, oblique, and lateral fluoroscopic projection used confirm needle placement in all planes. Images permanently stored in EMR. Interpretation: No contrast injected. I personally interpreted the imaging intraoperatively. Adequate needle placement confirmed in multiple planes. Permanent images saved  into the patient's record.  Antibiotic Prophylaxis:   Anti-infectives (From admission, onward)    None      Indication(s): None identified  Post-operative Assessment:  Post-procedure Vital Signs:  Pulse/HCG Rate: 7377 Temp: 97.7 F  (36.5 C) Resp: 16 BP: (!) 103/56 SpO2: 96 %  EBL: None  Complications: No immediate post-treatment complications observed by team, or reported by patient.  Note: The patient tolerated the entire procedure well. A repeat set of vitals were taken after the procedure and the patient was kept under observation following institutional policy, for this type of procedure. Post-procedural neurological assessment was performed, showing return to baseline, prior to discharge. The patient was provided with post-procedure discharge instructions, including a section on how to identify potential problems. Should any problems arise concerning this procedure, the patient was given instructions to immediately contact us , at any time, without hesitation. In any case, we plan to contact the patient by telephone for a follow-up status report regarding this interventional procedure.  Comments:  No additional relevant information.  Plan of Care (POC)  Orders:  Orders Placed This Encounter  Procedures   DG PAIN CLINIC C-ARM 1-60 MIN NO REPORT    Intraoperative interpretation by procedural physician at [facility].    Standing Status:   Standing    Number of Occurrences:   1    Reason for exam::   Assistance in needle guidance and placement for procedures requiring needle placement in or near specific anatomical locations not easily accessible without such assistance.    Medications ordered for procedure: Meds ordered this encounter  Medications   lidocaine  (XYLOCAINE ) 2 % (with pres) injection 400 mg   lactated ringers  infusion   midazolam  (VERSED ) injection 0.5-2 mg    Make sure Flumazenil is available in the pyxis when using this medication. If oversedation occurs, administer 0.2 mg IV over 15 sec. If after 45 sec no response, administer 0.2 mg again over 1 min; may repeat at 1 min intervals; not to exceed 4 doses (1 mg)   ropivacaine  (PF) 2 mg/mL (0.2%) (NAROPIN ) injection 18 mL    dexamethasone  (DECADRON ) injection 20 mg   Medications administered: We administered lidocaine , lactated ringers , midazolam , ropivacaine  (PF) 2 mg/mL (0.2%), and dexamethasone .  See the medical record for exact dosing, route, and time of administration.  Follow-up plan:   Return in about 8 weeks (around 09/10/2023) for F2F PPE.       B/L L3,4,5 MBNB #1 09/11/22, 10/30/22, B/L L3,4,5 RFA 12/02/22, 07/16/23     Recent Visits Date Type Provider Dept  04/30/23 Office Visit Cephus Collin, MD [facility]  Showing recent visits within past 90 days and meeting all other requirements Today's Visits Date Type Provider Dept  07/16/23 Procedure visit Cephus Collin, MD [facility]  Showing today's visits and meeting all other requirements Future Appointments Date Type Provider Dept  09/11/23 Appointment Cephus Collin, MD [facility]  Showing future appointments within next 90 days and meeting all other requirements  Disposition: Discharge home  Discharge (Date  Time): 07/16/2023; 1038 hrs.   Primary Care Physician: [facility] Location: [facility] Note by: Cephus Collin, MD (TTS technology used. I apologize for any typographical errors that were not detected and corrected.) Date: 07/16/2023; Time: 11:08 AM  Disclaimer:  Medicine is not an Visual merchandiser. The only guarantee in medicine is that nothing is guaranteed. It is important to note that the decision to proceed with this intervention was  based on the information collected from the patient. The Data and conclusions were drawn from the patient's questionnaire, the interview, and the physical examination. Because the information was provided in large part by the patient, it cannot be guaranteed that it has not been purposely or unconsciously manipulated. Every effort has been made to obtain as much relevant data as possible for this evaluation. It is important to note  that the conclusions that lead to this procedure are derived in large part from the available data. Always take into account that the treatment will also be dependent on availability of resources and existing treatment guidelines, considered by other Pain Management Practitioners as being common knowledge and practice, at the time of the intervention. For Medico-Legal purposes, it is also important to point out that variation in procedural techniques and pharmacological choices are the acceptable norm. The indications, contraindications, technique, and results of the above procedure should only be interpreted and judged by a Board-Certified Interventional Pain Specialist with extensive familiarity and expertise in the same exact procedure and technique.

## 2023-07-16 NOTE — Progress Notes (Signed)
 Safety precautions to be maintained throughout the outpatient stay will include: orient to surroundings, keep bed in low position, maintain call bell within reach at all times, provide assistance with transfer out of bed and ambulation.

## 2023-07-17 ENCOUNTER — Telehealth: Payer: Self-pay

## 2023-07-17 NOTE — Telephone Encounter (Signed)
 Patient returning nurse call,  states that she experienced different symptoms than prior procedures. Pain in right hip, 8/10 pain( has resolved) . Reports that back is very stiff but no pain currently. She also reports having a dull migraine like headache .

## 2023-07-17 NOTE — Telephone Encounter (Signed)
 Post procedure follow up.  Patients husband states that she is doing well.

## 2023-07-24 ENCOUNTER — Ambulatory Visit: Payer: Self-pay | Admitting: Psychiatry

## 2023-08-13 ENCOUNTER — Other Ambulatory Visit: Payer: Self-pay | Admitting: Psychiatry

## 2023-08-13 DIAGNOSIS — F3178 Bipolar disorder, in full remission, most recent episode mixed: Secondary | ICD-10-CM

## 2023-09-02 ENCOUNTER — Other Ambulatory Visit: Payer: Self-pay | Admitting: Internal Medicine

## 2023-09-02 DIAGNOSIS — R42 Dizziness and giddiness: Secondary | ICD-10-CM

## 2023-09-10 ENCOUNTER — Ambulatory Visit (INDEPENDENT_AMBULATORY_CARE_PROVIDER_SITE_OTHER): Payer: Self-pay | Admitting: Psychiatry

## 2023-09-10 ENCOUNTER — Encounter: Payer: Self-pay | Admitting: Psychiatry

## 2023-09-10 VITALS — BP 118/86 | HR 77 | Temp 97.3°F | Ht 65.5 in | Wt 207.4 lb

## 2023-09-10 DIAGNOSIS — I700 Atherosclerosis of aorta: Secondary | ICD-10-CM | POA: Insufficient documentation

## 2023-09-10 DIAGNOSIS — E66811 Obesity, class 1: Secondary | ICD-10-CM | POA: Insufficient documentation

## 2023-09-10 DIAGNOSIS — F840 Autistic disorder: Secondary | ICD-10-CM

## 2023-09-10 DIAGNOSIS — J309 Allergic rhinitis, unspecified: Secondary | ICD-10-CM | POA: Insufficient documentation

## 2023-09-10 DIAGNOSIS — F3174 Bipolar disorder, in full remission, most recent episode manic: Secondary | ICD-10-CM

## 2023-09-10 DIAGNOSIS — F418 Other specified anxiety disorders: Secondary | ICD-10-CM | POA: Insufficient documentation

## 2023-09-10 DIAGNOSIS — F603 Borderline personality disorder: Secondary | ICD-10-CM | POA: Diagnosis not present

## 2023-09-10 DIAGNOSIS — E559 Vitamin D deficiency, unspecified: Secondary | ICD-10-CM | POA: Insufficient documentation

## 2023-09-10 DIAGNOSIS — F4312 Post-traumatic stress disorder, chronic: Secondary | ICD-10-CM | POA: Insufficient documentation

## 2023-09-10 DIAGNOSIS — Z6834 Body mass index (BMI) 34.0-34.9, adult: Secondary | ICD-10-CM | POA: Insufficient documentation

## 2023-09-10 MED ORDER — LAMOTRIGINE 25 MG PO TABS: 75.0000 mg | ORAL_TABLET | Freq: Every day | ORAL | 1 refills | Status: DC

## 2023-09-10 NOTE — Progress Notes (Signed)
 [facility] MD OP Progress Note  09/10/2023 1:32 PM Marilyn Davis  MRN:  [id number]   Chief Complaint:  Chief Complaint  Patient presents with   Follow-up   Depression   Anxiety   Medication Refill   Discussed the use of AI scribe software for clinical note transcription with the patient, who gave verbal consent to proceed.  History of Present Illness Marilyn Davis is a 68 year old Caucasian female, married, lives in [place], has a history of bipolar disorder, borderline personality disorder, autism spectrum, multiple medical problems including diabetes mellitus, sciatica, dizziness, tremors was evaluated in office today for a follow-up appointment.  She was last seen in January and missed her follow-up appointment due to a hospital procedure for dizziness, which included an MRI. Her dizziness persists with variable severity, and she has an upcoming appointment to check her carotid arteries on August 4th.  Her current medications include Seroquel  200 mg at bedtime, Mirtazapine  15 mg at bedtime, Lamictal  200 mg at bedtime and 75 mg daily, Wellbutrin  XL 150 mg daily, and  Trazodone  200 mg at bedtime. She was prescribed Valium  for a dental appointment, which she has not scheduled due to fear from past traumatic dental experiences.  She has a history of PTSD, she currently has anxiety primarily triggered by dental visits due to traumatic childhood experiences, including being strapped down during procedures.  She is interested in establishing care with the therapist to work on this.  She reports difficulty with eating, having lost about two pounds over the past month. She typically eats only supper, consisting of items like shrimp egg rolls and spinach copita, and snacks on cherry tomatoes and occasionally ice cream. She drinks espresso in the morning and water throughout the day. She attributes her reduced appetite to warmer weather, despite staying in air conditioning.  Her sleep  pattern involves going to bed at midnight, falling asleep by 1:00 to 1:30 AM, and waking up around 9:30 to 10:00 AM. She describes her sleep as adequate but notes it takes time to fall asleep despite feeling relaxed after taking her medications.  Denies thoughts of self-harm or harm to others. She experiences jaw clenching and bruxism, which she attributes to anxiety and stress. She has been reading about autism and notes some self-stimulatory behaviors like making noises throughout the day.    Visit Diagnosis:    ICD-10-CM   1. Bipolar 1 disorder, manic, full remission (HCC)  F31.74     2. Borderline personality disorder (HCC)  F60.3 lamoTRIgine  (LAMICTAL ) 25 MG tablet    3. Autism spectrum disorder  F84.0     4. Other specified anxiety disorders  F41.8    With limited symptom attacks    5. Chronic post-traumatic stress disorder (PTSD)  F43.12       Past Psychiatric History: I have reviewed past psychiatric history from progress note on 07/31/2021.  Past Medical History:  Past Medical History:  Diagnosis Date   Bipolar disorder (HCC)    Diabetes mellitus without complication (HCC)    Sciatica     Past Surgical History:  Procedure Laterality Date   ABDOMINAL HYSTERECTOMY     BREAST BIOPSY Right 09/30/2019   9:00, 5cmfn, Q shape, neg   BREAST BIOPSY Right 09/30/2019   9:00, 9cmfn, vision, neg   EXPLORATORY LAPAROTOMY     SHOULDER SURGERY     TONSILLECTOMY      Family Psychiatric History: I have reviewed family psychiatric history from progress note on 07/31/2021.  Family History:  Family History  Problem Relation Age of Onset   Personality disorder Mother    Bipolar disorder Father    Alcohol abuse Brother    Drug abuse Brother    Suicidality Other    Breast cancer Neg Hx     Social History: I have reviewed social history from progress note on 07/31/2021. Social History   Socioeconomic History   Marital status: Married    Spouse name: Marilyn Davis   Number of  children: 2   Years of education: Not on file   Highest education level: Some college, no degree  Occupational History   Not on file  Tobacco Use   Smoking status: Never   Smokeless tobacco: Never  Vaping Use   Vaping status: Never Used  Substance and Sexual Activity   Alcohol use: Never   Drug use: Never   Sexual activity: Not Currently  Other Topics Concern   Not on file  Social History Narrative   Not on file   Social Drivers of Health   Financial Resource Strain: Not on file  Food Insecurity: Not on file  Transportation Needs: Not on file  Physical Activity: Not on file  Stress: Not on file  Social Connections: Not on file    Allergies:  Allergies  Allergen Reactions   Sumatriptan Shortness Of Breath    Facial numbness   Pseudoephedrine Hcl Other (See Comments)    manic Other reaction(s): Hallucinations   Cabbage Nausea And Vomiting    Cooked    Ibuprofen Itching    Metabolic Disorder Labs: Lab Results  Component Value Date   HGBA1C 6.2 (H) 03/13/2019   MPG 131.24 03/13/2019   Lab Results  Component Value Date   PROLACTIN 17.2 08/02/2021   No results found for: CHOL, TRIG, HDL, CHOLHDL, VLDL, LDLCALC Lab Results  Component Value Date   TSH 1.231 08/02/2021   TSH 1.178 03/13/2019    Therapeutic Level Labs: No results found for: LITHIUM No results found for: VALPROATE No results found for: CBMZ  Current Medications: Current Outpatient Medications  Medication Sig Dispense Refill   alendronate (FOSAMAX) 70 MG tablet Take 70 mg by mouth once a week.     atorvastatin  (LIPITOR) 10 MG tablet Take 1 tablet (10 mg total) by mouth daily at 6 PM. 30 tablet 1   buPROPion  (WELLBUTRIN  XL) 150 MG 24 hr tablet Take 1 tablet (150 mg total) by mouth daily. 90 tablet 1   Continuous Glucose Receiver (FREESTYLE LIBRE 2 READER) DEVI USE 1 DEVICE AS DIRECTED TO MONITOR BLOOD SUGARS FOR DIABETES E11.649 AND E11.42     Continuous Glucose Sensor  (FREESTYLE LIBRE 2 SENSOR) MISC      diazepam  (VALIUM ) 5 MG tablet Take 1 tablet (5 mg total) by mouth daily as needed for anxiety. Take prior to dental procedure ( after consulting with dentist) 3 tablet 0   DROPLET PEN NEEDLES 32G X 6 MM MISC      fenofibrate  micronized (LOFIBRA) 200 MG capsule Take 200 mg by mouth daily.     fluticasone (FLONASE) 50 MCG/ACT nasal spray Place 2 sprays into both nostrils daily.     gabapentin (NEURONTIN) 300 MG capsule Take 300 mg by mouth at bedtime.     lamoTRIgine  (LAMICTAL ) 200 MG tablet TAKE 1 TABLET (200 MG TOTAL) BY MOUTH DAILY. TAKE ALONG WITH 50 MG DAILY FOR TOTAL DAILY DOSE OF 250 MG 90 tablet 3   linaclotide (LINZESS) 145 MCG CAPS capsule Take 145 mcg by  mouth daily.     meloxicam (MOBIC) 15 MG tablet Take 15 mg by mouth daily.     mirtazapine  (REMERON ) 15 MG tablet Take 1 tablet (15 mg total) by mouth at bedtime. 90 tablet 1   montelukast  (SINGULAIR ) 10 MG tablet Take 1 tablet (10 mg total) by mouth at bedtime. 30 tablet 1   oxyCODONE -acetaminophen  (PERCOCET) 10-325 MG tablet Take 1 tablet by mouth 3 (three) times daily.     pantoprazole  (PROTONIX ) 40 MG tablet Take 40 mg by mouth daily.     propranolol (INDERAL) 20 MG tablet Take 20 mg by mouth 2 (two) times daily.     QUEtiapine  (SEROQUEL ) 100 MG tablet Take 2 tablets (200 mg total) by mouth at bedtime. 180 tablet 1   RYBELSUS  3 MG TABS Take 3 mg by mouth daily.     traMADol  (ULTRAM ) 50 MG tablet Take 50 mg by mouth every 6 (six) hours as needed.     traZODone  (DESYREL ) 100 MG tablet Take 1-2 tablets (100-200 mg total) by mouth at bedtime as needed for sleep. 90 tablet 3   UBRELVY 50 MG TABS Take 1 tablet by mouth daily.     lamoTRIgine  (LAMICTAL ) 25 MG tablet Take 3 tablets (75 mg total) by mouth daily. TAKE ALONG WITH 200MG  DAILY FOR TOTAL OF 275MG  DAILY 270 tablet 1   No current facility-administered medications for this visit.     Musculoskeletal: Strength & Muscle Tone: within normal  limits Gait & Station: normal Patient leans: N/A  Psychiatric Specialty Exam: Review of Systems  Psychiatric/Behavioral: Negative.      Blood pressure 118/86, pulse 77, temperature (!) 97.3 F (36.3 C), temperature source Temporal, height 5' 5.5 (1.664 m), weight 207 lb 6.4 oz (94.1 kg), SpO2 94%.Body mass index is 33.99 kg/m.  General Appearance: Casual  Eye Contact:  Fair  Speech:  Clear and Coherent  Volume:  Normal  Mood:  Euthymic  Affect:  Congruent  Thought Process:  Goal Directed and Descriptions of Associations: Intact  Orientation:  Full (Time, Place, and Person)  Thought Content: Logical   Suicidal Thoughts:  No  Homicidal Thoughts:  No  Memory:  Immediate;   Fair Recent;   Fair Remote;   Fair  Judgement:  Fair  Insight:  Fair  Psychomotor Activity:  Normal  Concentration:  Concentration: Fair and Attention Span: Fair  Recall:  [employer] of Knowledge: Fair  Language: Fair  Akathisia:  No  Handed:  Ambidextrous  AIMS (if indicated): done  Assets:  Communication Skills Desire for Improvement Housing Social Support Transportation  ADL's:  Intact  Cognition: WNL  Sleep:  Fair   Screenings: Midwife Visit from 09/10/2023 in [facility] Office Visit from 02/26/2023 in [facility] Office Visit from 12/26/2022 in [facility] Office Visit from 10/24/2022 in [facility] Office Visit from 07/22/2022 in [facility]  AIMS Total Score 0 0 0 0 0   AUDIT    Flowsheet Row Admission (Discharged) from 03/14/2019 in [facility]  Alcohol Use Disorder Identification Test Final Score (AUDIT) 0   GAD-7    Flowsheet Row Office Visit from 02/26/2023 in [facility] Office Visit from 12/26/2022 in [facility] Office Visit from 10/24/2022 in [facility] Office Visit from 07/22/2022  in [facility] Office Visit from 04/19/2022 in [facility]  Total GAD-7 Score 0 5 1 7 1    PHQ2-9    Flowsheet Row Procedure visit from 07/16/2023 in [facility] Visit from 04/30/2023 in [facility] Visit from 02/26/2023 in [facility] Office Visit from 12/26/2022 in [facility] Procedure visit from 12/02/2022 in [facility] Total Score 1 6 0 1 0   Flowsheet Row Office Visit from 09/10/2023 in [facility] Office Visit from 02/26/2023 in [facility] Office Visit from 12/26/2022 in [facility]  C-SSRS RISK CATEGORY Moderate Risk No Risk No Risk     Assessment and Plan: TRENELL MOXEY is a 68 year old Caucasian female who has a history of bipolar disorder, borderline personality disorder, autism spectrum was evaluated in office today.  Discussed assessment and plan as noted below.  Bipolar disorder in remission Currently well managed on the current medication regimen. Continue Seroquel  200 mg at bedtime Continue Mirtazapine  15 mg at bedtime Continue Lamictal  275 mg daily Continue Wellbutrin  XL 150 mg daily  Borderline personality disorder-stable Currently no acute issues discussed. Will monitor closely  Posttraumatic stress disorder-unstable Recent exacerbation of symptoms due to her fear of going to the dental office which does bring back a lot of  intrusive memories. Encouraged to establish care with therapist. Continue Mirtazapine  15 mg at bedtime Continue Trazodone  100-200 mg at bedtime.  Anxiety disorder-unstable Currently triggered by fear of going to the dental office.  Otherwise managing anxiety okay. Encouraged to establish care with therapist Continue Valium  5 mg provided 3 pills to be taken 20 minutes before dental appointments.  Patient advised to sign an ROI to obtain medical records-most recent labs per her primary care provider.   Follow-up Follow-up in clinic in 3 months or sooner if needed.    Collaboration of Care: Collaboration of Care: Referral or follow-up with counselor/therapist AEB patient encouraged to establish care with therapist.  Patient to sign an ROI to obtain most recent labs.  Patient encouraged to continue to follow up with her providers for her vertigo/dizziness.  Patient/Guardian was advised Release of Information must be obtained prior to any record release in order to collaborate their care with an outside provider. Patient/Guardian was advised if they have not already done so to contact the registration department to sign all necessary forms in order for us  to release information regarding their care.   Consent: Patient/Guardian gives verbal consent for treatment and assignment of benefits for services provided during this visit. Patient/Guardian expressed understanding and agreed to proceed.  This note was generated in part or whole with voice recognition software. Voice recognition is usually quite accurate but there are transcription errors that can and very often do occur. I apologize for any typographical errors that were not detected and corrected.     Reshanda Lewey, MD 09/12/2023, 6:08 AM

## 2023-09-11 ENCOUNTER — Encounter: Payer: Self-pay | Admitting: Student in an Organized Health Care Education/Training Program

## 2023-09-11 ENCOUNTER — Ambulatory Visit
Attending: Student in an Organized Health Care Education/Training Program | Admitting: Student in an Organized Health Care Education/Training Program

## 2023-09-11 VITALS — BP 107/54 | HR 74 | Temp 97.4°F | Resp 16 | Ht 65.0 in | Wt 204.0 lb

## 2023-09-11 DIAGNOSIS — G894 Chronic pain syndrome: Secondary | ICD-10-CM | POA: Insufficient documentation

## 2023-09-11 DIAGNOSIS — M47816 Spondylosis without myelopathy or radiculopathy, lumbar region: Secondary | ICD-10-CM | POA: Insufficient documentation

## 2023-09-11 NOTE — Progress Notes (Signed)
 PROVIDER NOTE: Interpretation of information contained herein should be left to medically-trained personnel. Specific patient instructions are provided elsewhere under Patient Instructions section of medical record. This document was created in part using AI and STT-dictation technology, any transcriptional errors that may result from this process are unintentional.  Patient: Marilyn Davis  Service: E/M   [facility]P: [facility]  DOB: [birth year]  DOS: 09/11/2023  Provider: Wallie Sherry, MD  MRN: [id number]  Delivery: Face-to-face  Specialty: Interventional Pain Management  Type: Established Patient  Setting: Ambulatory outpatient facility  Specialty designation: 09  Referring Prov.: [facility]  Location: Outpatient office facility       History of present illness (HPI) Marilyn Davis, a 68 y.o. year old female, is here today because of her Lumbar facet arthropathy [M47.816]. Marilyn Davis's primary complain today is Leg Pain (Occassional right leg pain r/t movement, happens rarely )  Pertinent problems: Marilyn Davis does not have any pertinent problems on file.  Pain Assessment: Severity of Chronic pain is reported as a 0-No pain/10. Location: Leg Right/denies. Onset: More than a month ago. Quality: Shooting. Timing: Intermittent. Modifying factor(s): RFA helped a lot,  does ot requre any  meds for pain currently. Vitals:  height is 5' 5 (1.651 m) and weight is 204 lb (92.5 kg). Her temporal temperature is 97.4 F (36.3 C) (abnormal). Her blood pressure is 107/54 (abnormal) and her pulse is 74. Her respiration is 16 and oxygen saturation is 96%.  BMI: Estimated body mass index is 33.95 kg/m as calculated from the following:   Height as of this encounter: 5' 5 (1.651 m).   Weight as of this encounter: 204 lb (92.5 kg).  Last encounter: 04/30/2023. Last procedure: 07/16/2023.  Reason for encounter:  Discussed the use of AI scribe software for  clinical note transcription with the patient, who gave verbal consent to proceed.  History of Present Illness   Marilyn Davis is a 68 year old female who presents for follow-up regarding pain management.  Her pain is generally at a baseline of zero, with occasional minor twinges.  She has initiated an exercise routine, which she finds beneficial for her overall functionality.     Post-Procedure Evaluation   Procedure: Lumbar Facet, Medial Branch Radiofrequency Ablation (RFA) #2  Laterality: Bilateral (-50)  Level: L3, L4, and L5 Medial Branch Level(s). These levels will denervate the L3-4 and L4-5 lumbar facet joints.  Imaging: Fluoroscopy-guided         Anesthesia: Local anesthesia (1-2% Lidocaine ) Sedation: Minimal Sedation                       DOS: 07/16/2023  Performed by: Wallie Sherry, MD  Purpose: Therapeutic/Palliative Indications: Low back pain severe enough to impact quality of life or function. Indications: 1. Lumbar facet arthropathy   2. Lumbar spondylosis   3. Chronic pain syndrome    Marilyn Davis has been dealing with the above chronic pain for longer than three months and has either failed to respond, was unable to tolerate, or simply did not get enough benefit from other more conservative therapies including, but not limited to: 1. Over-the-counter medications 2. Anti-inflammatory medications 3. Muscle relaxants 4. Membrane stabilizers 5. Opioids 6. Physical therapy and/or chiropractic manipulation 7. Modalities (Heat, ice, etc.) 8. Invasive techniques such as nerve blocks. Marilyn Davis has attained more than 50% relief of the pain from a series of diagnostic injections conducted in separate occasions.  Pain Score:  Pre-procedure: 5 /10 Post-procedure: 3 /10    Effectiveness:  Initial hour after procedure: 0 %  Subsequent 4-6 hours post-procedure: 90 %  Analgesia past initial 6 hours: 90 % (occassionally when stepping or twisting there is a  shooting pain down the right leg)  Ongoing improvement:  Analgesic:  90% Function: Marilyn Davis reports improvement in function ROM: Marilyn Davis reports improvement in ROM  ROS  Constitutional: Denies any fever or chills Gastrointestinal: No reported hemesis, hematochezia, vomiting, or acute GI distress Musculoskeletal: Denies any acute onset joint swelling, redness, loss of ROM, or weakness Neurological: No reported episodes of acute onset apraxia, aphasia, dysarthria, agnosia, amnesia, paralysis, loss of coordination, or loss of consciousness  Medication Review  FreeStyle Libre 2 Reader, FreeStyle Libre 2 Sensor, Insulin  Pen Needle, QUEtiapine , Semaglutide , Ubrogepant, alendronate, atorvastatin , buPROPion , diazepam , fenofibrate  micronized, fluticasone, gabapentin, lamoTRIgine , linaclotide, meloxicam, mirtazapine , montelukast , oxyCODONE -acetaminophen , pantoprazole , propranolol, traMADol , and traZODone   History Review  Allergy: Marilyn Davis is allergic to sumatriptan, pseudoephedrine hcl, cabbage, and ibuprofen. Drug: Marilyn Davis  reports no history of drug use. Alcohol:  reports no history of alcohol use. Tobacco:  reports that she has never smoked. She has never used smokeless tobacco. Social: Marilyn Davis  reports that she has never smoked. She has never used smokeless tobacco. She reports that she does not drink alcohol and does not use drugs. Medical:  has a past medical history of Bipolar disorder (HCC), Diabetes mellitus without complication (HCC), and Sciatica. Surgical: Marilyn Davis  has a past surgical history that includes Tonsillectomy; Abdominal hysterectomy; Exploratory laparotomy; Shoulder surgery; Breast biopsy (Right, 09/30/2019); and Breast biopsy (Right, 09/30/2019). Family: family history includes Alcohol abuse in her brother; Bipolar disorder in her father; Drug abuse in her brother; Personality disorder in her mother; Suicidality in an other family  member.  Laboratory Chemistry Profile   Renal Lab Results  Component Value Date   BUN 18 07/29/2021   CREATININE 1.06 (H) 07/29/2021   GFRAA 51 (L) 11/11/2019   GFRNONAA 58 (L) 07/29/2021    Hepatic Lab Results  Component Value Date   AST 22 07/29/2021   ALT 26 07/29/2021   ALBUMIN 4.3 07/29/2021   ALKPHOS 26 (L) 07/29/2021   LIPASE 70 (H) 07/29/2021    Electrolytes Lab Results  Component Value Date   NA 139 07/29/2021   K 4.2 07/29/2021   CL 111 07/29/2021   CALCIUM  9.8 07/29/2021    Bone No results found for: VD25OH, VD125OH2TOT, CI6874NY7, CI7874NY7, 25OHVITD1, 25OHVITD2, 25OHVITD3, TESTOFREE, TESTOSTERONE  Inflammation (CRP: Acute Phase) (ESR: Chronic Phase) No results found for: CRP, ESRSEDRATE, LATICACIDVEN       Note: Above Lab results reviewed.  Recent Imaging Review  DG PAIN CLINIC C-ARM 1-60 MIN NO REPORT Fluoro was used, but no Radiologist interpretation will be provided.  Please refer to NOTES tab for provider progress note. Note: Reviewed        Physical Exam  Vitals: BP (!) 107/54 (BP Location: Left Arm, Patient Position: Sitting, Cuff Size: Large)   Pulse 74   Temp (!) 97.4 F (36.3 C) (Temporal)   Resp 16   Ht 5' 5 (1.651 m)   Wt 204 lb (92.5 kg)   SpO2 96%   BMI 33.95 kg/m  BMI: Estimated body mass index is 33.95 kg/m as calculated from the following:   Height as of this encounter: 5' 5 (1.651 m).   Weight as of this encounter: 204 lb (92.5 kg). Ideal: Ideal body weight: 57 kg (125 lb 10.6  oz) Adjusted ideal body weight: 71.2 kg (157 lb) General appearance: Well nourished, well developed, and well hydrated. In no apparent acute distress Mental status: Alert, oriented x 3 (person, place, & time)       Respiratory: No evidence of acute respiratory distress Eyes: PERLA   Assessment   Diagnosis Status  1. Lumbar facet arthropathy   2. Lumbar spondylosis   3. Chronic pain syndrome     Controlled Controlled Controlled   Updated Problems: No problems updated.  Plan of Care  Excellent analgesic and functional response to bilateral lumbar radiofrequency ablation.  Patient states that pain scores are close to 0 and that she is in the gym exercising.  I encouraged her to continue with this, follow-up as needed   B/L L3,4,5 MBNB #1 09/11/22, 10/30/22, B/L L3,4,5 RFA 12/02/22, 07/16/23     Return for patient will call to schedule F2F appt prn.    Recent Visits Date Type Provider Dept  07/16/23 Procedure visit Marcelino Nurse, MD [facility]  Showing recent visits within past 90 days and meeting all other requirements Today's Visits Date Type Provider Dept  09/11/23 Office Visit Marcelino Nurse, MD [facility]  Showing today's visits and meeting all other requirements Future Appointments No visits were found meeting these conditions. Showing future appointments within next 90 days and meeting all other requirements  I discussed the assessment and treatment plan with the patient. The patient was provided an opportunity to ask questions and all were answered. The patient agreed with the plan and demonstrated an understanding of the instructions.  Patient advised to call back or seek an in-person evaluation if the symptoms or condition worsens.  Duration of encounter: .  Total time on encounter, as per AMA guidelines included both the face-to-face and non-face-to-face time personally spent by the physician and/or other qualified health care professional(s) on the day of the encounter (includes time in activities that require the physician or other qualified health care professional and does not include time in activities normally performed by clinical staff). Physician's time may include the following activities when performed: Preparing to see the patient (e.g., pre-charting review of records, searching for previously ordered imaging, lab work, and  nerve conduction tests) Review of prior analgesic pharmacotherapies. Reviewing PMP Interpreting ordered tests (e.g., lab work, imaging, nerve conduction tests) Performing post-procedure evaluations, including interpretation of diagnostic procedures Obtaining and/or reviewing separately obtained history Performing a medically appropriate examination and/or evaluation Counseling and educating the patient/family/caregiver Ordering medications, tests, or procedures Referring and communicating with other health care professionals (when not separately reported) Documenting clinical information in the electronic or other health record Independently interpreting results (not separately reported) and communicating results to the patient/ family/caregiver Care coordination (not separately reported)  Note by: Nurse Marcelino, MD (TTS and AI technology used. I apologize for any typographical errors that were not detected and corrected.) Date: 09/11/2023; Time: 10:14 AM

## 2023-09-11 NOTE — Progress Notes (Signed)
 Safety precautions to be maintained throughout the outpatient stay will include: orient to surroundings, keep bed in low position, maintain call bell within reach at all times, provide assistance with transfer out of bed and ambulation.

## 2023-09-16 ENCOUNTER — Telehealth: Payer: Self-pay | Admitting: Psychiatry

## 2023-09-16 NOTE — Telephone Encounter (Signed)
 I have reviewed labs received from primary care provider dated 08/27/2023 Dr. Tillman Entzminger TSH-1.190 within normal limits, free T4, T3 within normal limits, BUN-17-within normal limits, creatinine-1.24-elevated, serum glucose-elevated at 100, potassium elevated at 5.3, GFR-low at 48.  Patient to discuss abnormal labs with primary care provider.

## 2023-09-22 ENCOUNTER — Ambulatory Visit
Admission: RE | Admit: 2023-09-22 | Discharge: 2023-09-22 | Disposition: A | Discharge status: Home / Self Care | Source: Ambulatory Visit | Attending: Internal Medicine | Admitting: Internal Medicine

## 2023-09-22 DIAGNOSIS — R42 Dizziness and giddiness: Secondary | ICD-10-CM | POA: Insufficient documentation

## 2023-10-06 ENCOUNTER — Other Ambulatory Visit: Payer: Self-pay | Admitting: Psychiatry

## 2023-10-06 DIAGNOSIS — F3174 Bipolar disorder, in full remission, most recent episode manic: Secondary | ICD-10-CM

## 2023-10-06 DIAGNOSIS — F3161 Bipolar disorder, current episode mixed, mild: Secondary | ICD-10-CM

## 2023-10-22 ENCOUNTER — Other Ambulatory Visit: Payer: Self-pay | Admitting: Psychiatry

## 2023-10-22 DIAGNOSIS — F3174 Bipolar disorder, in full remission, most recent episode manic: Secondary | ICD-10-CM

## 2023-11-07 ENCOUNTER — Other Ambulatory Visit: Payer: Self-pay | Admitting: Psychiatry

## 2023-11-07 DIAGNOSIS — F603 Borderline personality disorder: Secondary | ICD-10-CM

## 2023-11-28 ENCOUNTER — Other Ambulatory Visit: Payer: Self-pay | Admitting: Psychiatry

## 2023-11-28 DIAGNOSIS — F603 Borderline personality disorder: Secondary | ICD-10-CM

## 2023-12-01 ENCOUNTER — Telehealth: Payer: Self-pay

## 2023-12-01 NOTE — Telephone Encounter (Signed)
 Patients spouse called to request a refill of traZODone  (DESYREL ) 100 MG tablet     Last visit 09-10-23 Next visit 12-08-23    [facility] - [place], [place] - [address] Phone: [phone]  Fax: [phone]

## 2023-12-01 NOTE — Telephone Encounter (Signed)
 Patient is not due for refill for trazodone  yet.

## 2023-12-02 ENCOUNTER — Telehealth: Payer: Self-pay | Admitting: Psychiatry

## 2023-12-02 DIAGNOSIS — F603 Borderline personality disorder: Secondary | ICD-10-CM

## 2023-12-02 MED ORDER — TRAZODONE HCL 100 MG PO TABS: 100.0000 mg | ORAL_TABLET | Freq: Every evening | ORAL | 3 refills | Status: AC | PRN

## 2023-12-02 NOTE — Telephone Encounter (Signed)
I have sent trazodone to pharmacy as requested.

## 2023-12-08 ENCOUNTER — Encounter: Payer: Self-pay | Admitting: Psychiatry

## 2023-12-08 ENCOUNTER — Other Ambulatory Visit: Payer: Self-pay

## 2023-12-08 ENCOUNTER — Ambulatory Visit: Admitting: Psychiatry

## 2023-12-08 VITALS — BP 120/76 | HR 74 | Temp 96.3°F | Ht 65.0 in | Wt 204.4 lb

## 2023-12-08 DIAGNOSIS — F418 Other specified anxiety disorders: Secondary | ICD-10-CM

## 2023-12-08 DIAGNOSIS — F840 Autistic disorder: Secondary | ICD-10-CM | POA: Diagnosis not present

## 2023-12-08 DIAGNOSIS — F3174 Bipolar disorder, in full remission, most recent episode manic: Secondary | ICD-10-CM | POA: Diagnosis not present

## 2023-12-08 DIAGNOSIS — F4312 Post-traumatic stress disorder, chronic: Secondary | ICD-10-CM

## 2023-12-08 DIAGNOSIS — F603 Borderline personality disorder: Secondary | ICD-10-CM

## 2023-12-08 NOTE — Progress Notes (Signed)
 [facility] MD OP Progress Note  12/08/2023 2:07 PM Marilyn Davis  MRN:  [id number]  Chief Complaint:  Chief Complaint  Patient presents with   Follow-up   Depression   Anxiety   Medication Refill   Discussed the use of AI scribe software for clinical note transcription with the patient, who gave verbal consent to proceed.  History of Present Illness Marilyn Davis is a 68 year old Caucasian female, married, lives in [place], has a history of bipolar disorder, borderline personality disorder, autism spectrum, multiple medical problems including diabetes melitis, sciatica, dizziness, tremors was evaluated in office today for a follow-up appointment.  Describing her current medication regimen, she reports feeling balanced and continues to engage her mind with activities such as diamond puzzles, logic games, and memorizing the periodic table. Ongoing therapy remains part of her routine, and she is considering exploring a different type of therapy to address her episodes of irritability, including her belief that she may be on the autism spectrum. She notes long-standing challenges with audio processing, episodes where her mind goes blank, difficulty with eye contact, and use of self-soothing behaviors when anxious.  Her current medications include Seroquel  200 mg, mirtazapine  15 mg, lamotrigine  275 mg, and Wellbutrin  150 mg. For sleep, she takes trazodone  at night but recently ran out; she notes sleeping well for the first couple of nights without it but experienced some difficulty last night. She emphasizes her commitment to staying on her medications and values the sense of balance they provide.  She denies thoughts of harming herself or others. She experiences fine tremors, which she does not consider significant. She experiences periods of low appetite, particularly in the mornings, and sometimes needs to encourage herself to eat, but she has been making efforts to eat more regularly  throughout the day.  She denies any suicidality, homicidality or perceptual disturbances.   Visit Diagnosis:    ICD-10-CM   1. Bipolar 1 disorder, manic, full remission  F31.74    moderate    2. Borderline personality disorder (HCC)  F60.3     3. Autism spectrum disorder  F84.0     4. Other specified anxiety disorders  F41.8    With limited symptom attacks    5. Chronic post-traumatic stress disorder (PTSD)  F43.12       Past Psychiatric History: I have reviewed past psychiatric history from progress note on 07/31/2021.  Past Medical History:  Past Medical History:  Diagnosis Date   Bipolar disorder (HCC)    Diabetes mellitus without complication (HCC)    Sciatica     Past Surgical History:  Procedure Laterality Date   ABDOMINAL HYSTERECTOMY     BREAST BIOPSY Right 09/30/2019   9:00, 5cmfn, Q shape, neg   BREAST BIOPSY Right 09/30/2019   9:00, 9cmfn, vision, neg   EXPLORATORY LAPAROTOMY     SHOULDER SURGERY     TONSILLECTOMY      Family Psychiatric History: I have reviewed family psychiatric history from progress note on 07/31/2021.  Family History:  Family History  Problem Relation Age of Onset   Personality disorder Mother    Bipolar disorder Father    Alcohol abuse Brother    Drug abuse Brother    Suicidality Other    Breast cancer Neg Hx     Social History: I have reviewed social history from progress note on 07/31/2021. Social History   Socioeconomic History   Marital status: Married    Spouse name: johnnie   Number of children: 2  Years of education: Not on file   Highest education level: Some college, no degree  Occupational History   Not on file  Tobacco Use   Smoking status: Never   Smokeless tobacco: Never  Vaping Use   Vaping status: Never Used  Substance and Sexual Activity   Alcohol use: Never   Drug use: Never   Sexual activity: Not Currently  Other Topics Concern   Not on file  Social History Narrative   Not on file   Social  Drivers of Health   Financial Resource Strain: Not on file  Food Insecurity: Not on file  Transportation Needs: Not on file  Physical Activity: Not on file  Stress: Not on file  Social Connections: Not on file    Allergies:  Allergies  Allergen Reactions   Sumatriptan Shortness Of Breath    Facial numbness   Pseudoephedrine Hcl Other (See Comments)    manic Other reaction(s): Hallucinations   Cabbage Nausea And Vomiting    Cooked    Ibuprofen Itching    Metabolic Disorder Labs: Lab Results  Component Value Date   HGBA1C 6.2 (H) 03/13/2019   MPG 131.24 03/13/2019   Lab Results  Component Value Date   PROLACTIN 17.2 08/02/2021   No results found for: CHOL, TRIG, HDL, CHOLHDL, VLDL, LDLCALC Lab Results  Component Value Date   TSH 1.231 08/02/2021   TSH 1.178 03/13/2019    Therapeutic Level Labs: No results found for: LITHIUM No results found for: VALPROATE No results found for: CBMZ  Current Medications: Current Outpatient Medications  Medication Sig Dispense Refill   alendronate (FOSAMAX) 70 MG tablet Take 70 mg by mouth once a week.     atorvastatin  (LIPITOR) 10 MG tablet Take 1 tablet (10 mg total) by mouth daily at 6 PM. 30 tablet 1   buPROPion  (WELLBUTRIN  XL) 150 MG 24 hr tablet TAKE 1 TABLET (150 MG TOTAL) BY MOUTH DAILY. 90 tablet 3   Continuous Glucose Receiver (FREESTYLE LIBRE 2 READER) DEVI USE 1 DEVICE AS DIRECTED TO MONITOR BLOOD SUGARS FOR DIABETES E11.649 AND E11.42     Continuous Glucose Sensor (FREESTYLE LIBRE 2 SENSOR) MISC      diazepam  (VALIUM ) 5 MG tablet Take 1 tablet (5 mg total) by mouth daily as needed for anxiety. Take prior to dental procedure ( after consulting with dentist) 3 tablet 0   DROPLET PEN NEEDLES 32G X 6 MM MISC      fenofibrate  micronized (LOFIBRA) 200 MG capsule Take 200 mg by mouth daily.     fluticasone (FLONASE) 50 MCG/ACT nasal spray Place 2 sprays into both nostrils daily.     lamoTRIgine  (LAMICTAL )  200 MG tablet TAKE 1 TABLET (200 MG TOTAL) BY MOUTH DAILY. TAKE ALONG WITH 50 MG DAILY FOR TOTAL DAILY DOSE OF 250 MG 90 tablet 3   lamoTRIgine  (LAMICTAL ) 25 MG tablet Take 3 tablets (75 mg total) by mouth daily. TAKE ALONG WITH 200MG  DAILY FOR TOTAL OF 275MG  DAILY 270 tablet 1   linaclotide (LINZESS) 145 MCG CAPS capsule Take 145 mcg by mouth daily.     meloxicam (MOBIC) 15 MG tablet Take 15 mg by mouth daily.     mirtazapine  (REMERON ) 15 MG tablet TAKE 1 TABLET AT BEDTIME 90 tablet 3   montelukast  (SINGULAIR ) 10 MG tablet Take 1 tablet (10 mg total) by mouth at bedtime. 30 tablet 1   oxyCODONE -acetaminophen  (PERCOCET) 10-325 MG tablet Take 1 tablet by mouth 3 (three) times daily.  pantoprazole  (PROTONIX ) 40 MG tablet Take 40 mg by mouth daily.     propranolol (INDERAL) 20 MG tablet Take 20 mg by mouth 2 (two) times daily.     QUEtiapine  (SEROQUEL ) 100 MG tablet TAKE 2 TABLETS AT BEDTIME 180 tablet 3   RYBELSUS  3 MG TABS Take 3 mg by mouth daily.     traMADol  (ULTRAM ) 50 MG tablet Take 50 mg by mouth every 6 (six) hours as needed.     traZODone  (DESYREL ) 100 MG tablet Take 1-2 tablets (100-200 mg total) by mouth at bedtime as needed for sleep. 180 tablet 3   UBRELVY 50 MG TABS Take 1 tablet by mouth daily.     No current facility-administered medications for this visit.     Musculoskeletal: Strength & Muscle Tone: within normal limits Gait & Station: normal Patient leans: N/A  Psychiatric Specialty Exam: Review of Systems  Psychiatric/Behavioral: Negative.      Blood pressure 120/76, pulse 74, temperature (!) 96.3 F (35.7 C), temperature source Temporal, height 5' 5 (1.651 m), weight 204 lb 6.4 oz (92.7 kg).Body mass index is 34.01 kg/m.  General Appearance: Casual  Eye Contact:  Fair  Speech:  Clear and Coherent  Volume:  Normal  Mood:  Euthymic  Affect:  Congruent  Thought Process:  Goal Directed and Descriptions of Associations: Intact  Orientation:  Full (Time, Place,  and Person)  Thought Content: Logical   Suicidal Thoughts:  No  Homicidal Thoughts:  No  Memory:  Immediate;   Fair Recent;   Fair Remote;   Fair  Judgement:  Fair  Insight:  Fair  Psychomotor Activity:  Normal  Concentration:  Concentration: Fair and Attention Span: Fair  Recall:  [employer] of Knowledge: Fair  Language: Fair  Akathisia:  No  Handed:  Ambidextrous  AIMS (if indicated): done  Assets:  Communication Skills Desire for Improvement Housing Intimacy Talents/Skills Transportation  ADL's:  Intact  Cognition: WNL  Sleep:  Fair   Screenings: Midwife Visit from 12/08/2023 in [facility] Office Visit from 09/10/2023 in [facility] Office Visit from 02/26/2023 in [facility] Office Visit from 12/26/2022 in [facility] Office Visit from 10/24/2022 in [facility]  AIMS Total Score 0 0 0 0 0   AUDIT    Flowsheet Row Admission (Discharged) from 03/14/2019 in [facility]  Alcohol Use Disorder Identification Test Final Score (AUDIT) 0   GAD-7    Flowsheet Row Office Visit from 12/08/2023 in [facility] Office Visit from 02/26/2023 in [facility] Office Visit from 12/26/2022 in [facility] Office Visit from 10/24/2022 in [facility] Office Visit from 07/22/2022 in [facility]  Total GAD-7 Score 3 0 5 1 7    PHQ2-9    Flowsheet Row Office Visit from 12/08/2023 in [facility] Office Visit from 09/11/2023 in [facility] Procedure visit from  07/16/2023 in [facility] Visit from 04/30/2023 in [facility] Visit from 02/26/2023 in [facility]  PHQ-2 Total Score 0 0 1 6 0   Flowsheet Row Office Visit from  12/08/2023 in [facility] Office Visit from 09/10/2023 in [facility] Office Visit from 02/26/2023 in [facility]  C-SSRS RISK CATEGORY Moderate Risk Moderate Risk No Risk     Assessment and Plan: Marilyn Davis is a 68 year old Caucasian female who has a history of bipolar disorder, borderline personality disorder, autism spectrum was evaluated in office today.  Discussed assessment and plan as noted below.  1. Bipolar 1 disorder, manic, full remission Currently denies any significant mania or depression symptoms Continue Seroquel  200 mg at bedtime Continue Mirtazapine  15 mg at bedtime Continue Lamictal  to 75 mg daily Continue Wellbutrin  XL 150 mg daily  2. Borderline personality disorder (HCC)-stable Did not report any acute issues. Continue to monitor closely  3. Autism spectrum disorder-chronic Does report ongoing problems related to autism spectrum and is interested in psychotherapy Provided resources in the community patient also on a wait list with our therapist.  4. Other specified anxiety disorders-stable Overall anxiety symptoms manageable on current medication regimen. Encouraged to establish care with therapist. Continue Mirtazapine  15 mg at bedtime  5. Chronic post-traumatic stress disorder (PTSD)-stable Currently denies any significant trauma related symptoms well-managed on current medication regimen. Continue Mirtazapine  15 mg at bedtime Continue Trazodone  100-200 mg at bedtime  Pending labs, hemoglobin A1c  and lipid panel.  Patient agrees to sign a release to obtain this from primary care provider.  Follow-up Follow-up in clinic in 3 months or sooner if needed.    Collaboration of Care: Collaboration of Care: Referral or follow-up with counselor/therapist AEB and courage to establish care with therapist, provided resources.  Patient/Guardian was advised Release of Information must be obtained prior to any record release in order to collaborate their care with an outside provider. Patient/Guardian was advised if they have not already done so to contact the registration department to sign all necessary forms in order for us  to release information regarding their care.   Consent: Patient/Guardian gives verbal consent for treatment and assignment of benefits for services provided during this visit. Patient/Guardian expressed understanding and agreed to proceed.  This note was generated in part or whole with voice recognition software. Voice recognition is usually quite accurate but there are transcription errors that can and very often do occur. I apologize for any typographical errors that were not detected and corrected.     Gearld Kerstein, MD 12/08/2023, 2:07 PM

## 2023-12-23 ENCOUNTER — Encounter: Payer: Self-pay | Admitting: Student in an Organized Health Care Education/Training Program

## 2023-12-23 ENCOUNTER — Ambulatory Visit
Attending: Student in an Organized Health Care Education/Training Program | Admitting: Student in an Organized Health Care Education/Training Program

## 2023-12-23 VITALS — BP 126/43 | HR 77 | Temp 97.3°F | Resp 16 | Ht 65.0 in | Wt 200.0 lb

## 2023-12-23 DIAGNOSIS — G894 Chronic pain syndrome: Secondary | ICD-10-CM | POA: Diagnosis present

## 2023-12-23 DIAGNOSIS — M47816 Spondylosis without myelopathy or radiculopathy, lumbar region: Secondary | ICD-10-CM | POA: Insufficient documentation

## 2023-12-23 NOTE — Patient Instructions (Signed)

## 2023-12-23 NOTE — Progress Notes (Signed)
 PROVIDER NOTE: Interpretation of information contained herein should be left to medically-trained personnel. Specific patient instructions are provided elsewhere under Patient Instructions section of medical record. This document was created in part using AI and STT-dictation technology, any transcriptional errors that may result from this process are unintentional.  Patient: Marilyn Davis  Service: E/M   PCP: Marilyn, Marilyn Davis  DOB: May 15, 1955  DOS: 12/23/2023  Provider: Wallie Sherry, MD  MRN: [id number]  Delivery: Face-to-face  Specialty: Interventional Pain Management  Type: Established Patient  Setting: Ambulatory outpatient facility  Specialty designation: 09  Referring Prov.: Marilyn, Davis Davis  Location: Outpatient office facility       History of present illness (HPI) Marilyn Davis, a 68 y.o. year old female, is here today because of her Lumbar facet arthropathy [M47.816]. Marilyn Davis's primary complain today is Hip Pain (Right )  Pertinent problems: Marilyn Davis does not have any pertinent problems on file.  Pain Assessment: Severity of Chronic pain is reported as a 2 /10. Location: Hip Right/into right leg to the knee. Onset: More than a month ago. Quality: Discomfort, Shooting, Stabbing, Aching, Constant, Sore. Timing: Constant. Modifying factor(s): procedure helped approx 6 months. pain medications. Vitals:  height is 5' 5 (1.651 m) and weight is 200 lb (90.7 kg). Her temporal temperature is 97.3 F (36.3 C) (abnormal). Her blood pressure is 126/43 (abnormal) and her pulse is 77. Her respiration is 16 and oxygen saturation is 97%.  BMI: Estimated body mass index is 33.28 kg/m as calculated from the following:   Height as of this encounter: 5' 5 (1.651 m).   Weight as of this encounter: 200 lb (90.7 kg).  Last encounter: 09/11/2023. Last procedure: 07/16/2023.  Reason for encounter:   Patient follows up after previous lumbar radiofrequency ablation  done 07/16/2023 bilaterally at L3, L4, L5.  This provided her with 75% pain relief for almost 6 months. During this time she was much more functional, completing ADLs more comfortably, and also improvement in her sleep during that time,  She is endorsing return of axial low back pain and would like to have her lumbar radiofrequency ablation repeated prior to the holidays.  ROS  Constitutional: Denies any fever or chills Gastrointestinal: No reported hemesis, hematochezia, vomiting, or acute GI distress Musculoskeletal: Axial low back pain Neurological: No reported episodes of acute onset apraxia, aphasia, dysarthria, agnosia, amnesia, paralysis, loss of coordination, or loss of consciousness  Medication Review  FreeStyle Libre 2 Reader, FreeStyle Libre 2 Sensor, Insulin  Pen Needle, QUEtiapine , Semaglutide , Ubrogepant, alendronate, atorvastatin , buPROPion , diazepam , fenofibrate  micronized, fluticasone, lamoTRIgine , linaclotide, meloxicam, mirtazapine , montelukast , oxyCODONE -acetaminophen , pantoprazole , propranolol, traMADol , and traZODone   History Review  Allergy: Marilyn Davis is allergic to sumatriptan, pseudoephedrine hcl, cabbage, and ibuprofen. Drug: Marilyn Davis  reports no history of drug use. Alcohol:  reports no history of alcohol use. Tobacco:  reports that she has never smoked. She has never used smokeless tobacco. Social: Marilyn Davis  reports that she has never smoked. She has never used smokeless tobacco. She reports that she does not drink alcohol and does not use drugs. Medical:  has a past medical history of Bipolar disorder (HCC), Diabetes mellitus without complication (HCC), and Sciatica. Surgical: Marilyn Davis  has a past surgical history that includes Tonsillectomy; Abdominal hysterectomy; Exploratory laparotomy; Shoulder surgery; Breast biopsy (Right, 09/30/2019); and Breast biopsy (Right, 09/30/2019). Family: family history includes Alcohol abuse in her brother; Bipolar  disorder in her father; Drug abuse in her brother; Personality disorder in her mother;  Suicidality in an other family member.  Laboratory Chemistry Profile   Renal Lab Results  Component Value Date   BUN 18 07/29/2021   CREATININE 1.06 (H) 07/29/2021   GFRAA 51 (L) 11/11/2019   GFRNONAA 58 (L) 07/29/2021    Hepatic Lab Results  Component Value Date   AST 22 07/29/2021   ALT 26 07/29/2021   ALBUMIN 4.3 07/29/2021   ALKPHOS 26 (L) 07/29/2021   LIPASE 70 (H) 07/29/2021    Electrolytes Lab Results  Component Value Date   NA 139 07/29/2021   K 4.2 07/29/2021   CL 111 07/29/2021   CALCIUM  9.8 07/29/2021    Bone No results found for: VD25OH, VD125OH2TOT, CI6874NY7, CI7874NY7, 25OHVITD1, 25OHVITD2, 25OHVITD3, TESTOFREE, TESTOSTERONE  Inflammation (CRP: Acute Phase) (ESR: Chronic Phase) No results found for: CRP, ESRSEDRATE, LATICACIDVEN       Note: Above Lab results reviewed.  Recent Imaging Review  US  Carotid Bilateral CLINICAL DATA:  Dizziness  EXAM: BILATERAL CAROTID DUPLEX ULTRASOUND  TECHNIQUE: Marilyn Davis scale imaging, color Doppler and duplex ultrasound were performed of bilateral carotid and vertebral arteries in the neck.  COMPARISON:  None Available.  FINDINGS: Criteria: Quantification of carotid stenosis is based on velocity parameters that correlate the residual internal carotid diameter with NASCET-based stenosis levels, using the diameter of the distal internal carotid lumen as the denominator for stenosis measurement.  The following velocity measurements were obtained:  RIGHT  ICA: 85 cm/sec  CCA: 91 cm/sec  SYSTOLIC ICA/CCA RATIO:  0.9  ECA: 81 cm/sec  LEFT  ICA: 80 cm/sec  CCA: 90 cm/sec  SYSTOLIC ICA/CCA RATIO:  0.9  ECA: 87 cm/sec  RIGHT CAROTID ARTERY: Patent.  RIGHT VERTEBRAL ARTERY:  Antegrade.  LEFT CAROTID ARTERY:  Patent.  LEFT VERTEBRAL ARTERY:  Antegrade.  IMPRESSION: 1. Right carotid system: No  evidence of flow-limiting stenosis. 2. Left carotid system: No evidence of flow-limiting stenosis.  Electronically Signed   By: Marilyn Davis M.D.   On: 09/23/2023 06:35  EXAM: MRI LUMBAR SPINE WITHOUT AND WITH CONTRAST   TECHNIQUE: Multiplanar and multiecho pulse sequences of the lumbar spine were obtained without and with intravenous contrast.   CONTRAST:  9mL GADAVIST  GADOBUTROL  1 MMOL/ML IV SOLN   COMPARISON:  None Available.   FINDINGS: Segmentation:  Standard.   Alignment: Slight (grade 1) anterolisthesis of L4 on L5. Otherwise, normal.   Vertebrae: No focal marrow edema to suggest acute fracture or discitis/osteomyelitis. No suspicious bone lesion. No pathologic enhancement.   Conus medullaris and cauda equina: Conus extends to the L2 level. Conus appears normal. No pathologic enhancement.   Paraspinal and other soft tissues: Negative.   Disc levels:   T12-L1: No significant disc protrusion, foraminal stenosis, or canal stenosis.   L1-L2: No significant disc protrusion, foraminal stenosis, or canal stenosis.   L2-L3: No significant disc protrusion, foraminal stenosis, or canal stenosis.   L3-L4: Slight disc bulging. Facet arthropathy No significant stenosis.   L4-L5: Mild uncovering of the disc. Facet arthropathy No significant stenosis.   L5-S1: Facet arthropathy. Slight disc bulging. No significant stenosis.   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 Facet arthropathy is greatest at L5-S1.    Note: Reviewed        Physical Exam  Vitals: BP (!) 126/43 (BP Location: Right Arm, Patient Position: Sitting, Cuff Size: Large)   Pulse 77   Temp (!) 97.3 F (36.3 C) (Temporal)   Resp 16   Ht 5' 5 (1.651 m)   Wt 200 lb (  90.7 kg)   SpO2 97%   BMI 33.28 kg/m  BMI: Estimated body mass index is 33.28 kg/m as calculated from the following:   Height as of this encounter: 5' 5 (1.651 m).   Weight as of this encounter:  200 lb (90.7 kg). Ideal: Ideal body weight: 57 kg (125 lb 10.6 oz) Adjusted ideal body weight: 70.5 kg (155 lb 6.4 oz) General appearance: Well nourished, well developed, and well hydrated. In no apparent acute distress Mental status: Alert, oriented x 3 (person, place, & time)       Respiratory: No evidence of acute respiratory distress Eyes: PERLA  + axial low back pain, worse with facet loading Assessment   Diagnosis  1. Lumbar facet arthropathy   2. Lumbar spondylosis   3. Chronic pain syndrome      Updated Problems: No problems updated.  Repeat bilateral L3,4,5 RFA for management of lumbar facet arthropathy  [name] has a current medication list which includes the following long-term medication(s): atorvastatin , bupropion , fluticasone, lamotrigine , lamotrigine , mirtazapine , montelukast , pantoprazole , quetiapine , trazodone , and linaclotide.  Pharmacotherapy (Medications Ordered): No orders of the defined types were placed in this encounter.  Orders:  Orders Placed This Encounter  Procedures   Radiofrequency,Lumbar    Standing Status:   Future    Expected Date:   01/05/2024    Expiration Date:   12/22/2024    Scheduling Instructions:     Side(s): Bilateral     Level(s): L3, L4, L5, Medial Branch Nerve(s)     Sedation: With Sedation     Scheduling Timeframe: As soon as pre-approved    Where will this procedure be performed?:   [facility]     B/L L3,4,5 MBNB #1 09/11/22, 10/30/22, B/L L3,4,5 RFA 12/02/22, 07/16/23     Return in about 13 days (around 01/05/2024) for B/L L3, 4, 5 RFA, in clinic IV Versed .    Recent Visits No visits were found meeting these conditions. Showing recent visits within past 90 days and meeting all other requirements Today's Visits Date Type Provider Dept  12/23/23 Office Visit Marcelino Nurse, MD [facility]  Showing today's visits and meeting all other requirements Future Appointments No visits were  found meeting these conditions. Showing future appointments within next 90 days and meeting all other requirements  I discussed the assessment and treatment plan with the patient. The patient was provided an opportunity to ask questions and all were answered. The patient agreed with the plan and demonstrated an understanding of the instructions.  Patient advised to call back or seek an in-person evaluation if the symptoms or condition worsens.  I personally spent a total of 30 minutes in the care of the patient today including preparing to see the patient, getting/reviewing separately obtained history, performing a medically appropriate exam/evaluation, counseling and educating, placing orders, and documenting clinical information in the EHR.   Note by: Nurse Marcelino, MD (TTS and AI technology used. I apologize for any typographical errors that were not detected and corrected.) Date: 12/23/2023; Time: 9:23 AM

## 2024-01-22 ENCOUNTER — Ambulatory Visit: Admitting: Podiatry

## 2024-01-22 DIAGNOSIS — L600 Ingrowing nail: Secondary | ICD-10-CM

## 2024-01-22 NOTE — Progress Notes (Unsigned)
  Subjective:  Patient ID: Marilyn Davis, female    DOB: 04/27/2001,  MRN: [id number]  Chief Complaint  Patient presents with   Ingrown Toenail    68 y.o. female presents with the above complaint.  Patient presents with right hallux medial border ingrown painful to touch has progressed gotten worse worse with ambulation hurts with pressure pain scale 7 out of 10 dull aching nature she would like to have it removed she has not seen her neurologist prior to seeing   Review of Systems: Negative except as noted in the HPI. Denies N/V/F/Ch.  Past Medical History:  Diagnosis Date   Asthma    Headache    Insulin  resistance syndrome    Migraines    Multiple food allergies    Tomatoes, almonds, apples, carrots, orages, beans, cucumbers   Obesity    Prediabetes    Seasonal allergies    Tinnitus     Current Outpatient Medications:    Norethindrone  Acetate-Ethinyl Estradiol  (LARIN  1.5/30) 1.5-30 MG-MCG tablet, Take 1 tablet by mouth daily., Disp: 84 tablet, Rfl: 3   norethindrone -ethinyl estradiol -iron (LOESTRIN  FE) 1.5-30 MG-MCG tablet, Take 1 tablet by mouth daily. (Patient not taking: Reported on 09/17/2023), Disp: 28 tablet, Rfl: 0   venlafaxine  XR (EFFEXOR  XR) 37.5 MG 24 hr capsule, Take 1 capsule (37.5 mg total) by mouth daily with breakfast. (Patient not taking: Reported on 09/17/2023), Disp: 30 capsule, Rfl: 1  Social History   Tobacco Use  Smoking Status Never  Smokeless Tobacco Never    Allergies  Allergen Reactions   Latex Rash   Penicillins    Augmentin [Amoxicillin-Pot Clavulanate] Rash   Objective:  There were no vitals filed for this visit. There is no height or weight on file to calculate BMI. Constitutional Well developed. Well nourished.  Vascular Dorsalis pedis pulses palpable bilaterally. Posterior tibial pulses palpable bilaterally. Capillary refill normal to all digits.  No cyanosis or clubbing noted. Pedal hair growth normal.  Neurologic Normal  speech. Oriented to person, place, and time. Epicritic sensation to light touch grossly present bilaterally.  Dermatologic Painful ingrowing nail at medial nail borders of the hallux nail right. No other open wounds. No skin lesions.  Orthopedic: Normal joint ROM without pain or crepitus bilaterally. No visible deformities. No bony tenderness.   Radiographs: None Assessment:   1. Ingrown toenail   2. Ingrown toenail of right foot    Plan:  Patient was evaluated and treated and all questions answered.  Ingrown Nail, right -Patient elects to proceed with minor surgery to remove ingrown toenail removal today. Consent reviewed and signed by patient. -Ingrown nail excised. See procedure note. -Educated on post-procedure care including soaking. Written instructions provided and reviewed. -Patient to follow up in 2 weeks for nail check.  Procedure: Excision of Ingrown Toenail Location: Right 1st toe medial nail borders. Anesthesia: Lidocaine  1% plain; 1.5 mL and Marcaine 0.5% plain; 1.5 mL, digital block. Skin Prep: Betadine. Dressing: Silvadene; telfa; dry, sterile, compression dressing. Technique: Following skin prep, the toe was exsanguinated and a tourniquet was secured at the base of the toe. The affected nail border was freed, split with a nail splitter, and excised. Chemical matrixectomy was then performed with phenol and irrigated out with alcohol. The tourniquet was then removed and sterile dressing applied. Disposition: Patient tolerated procedure well. Patient to return in 2 weeks for follow-up.   No follow-ups on file.

## 2024-02-02 ENCOUNTER — Telehealth: Payer: Self-pay

## 2024-02-02 NOTE — Telephone Encounter (Signed)
 Insurance denied her RFA because her pain scale was documented at a 2. It must be 6 or above to be considered. I will have to appeal but I will need you to document a new pain score.

## 2024-02-17 ENCOUNTER — Encounter: Payer: Self-pay | Admitting: Student in an Organized Health Care Education/Training Program

## 2024-02-17 NOTE — Telephone Encounter (Signed)
 I think she is changing insurance the first of the year. I will call her and find out for sure. If she is I will have to wait until after the first anyway to try to get auth.

## 2024-02-23 ENCOUNTER — Ambulatory Visit
Attending: Student in an Organized Health Care Education/Training Program | Admitting: Student in an Organized Health Care Education/Training Program

## 2024-02-23 DIAGNOSIS — M47816 Spondylosis without myelopathy or radiculopathy, lumbar region: Secondary | ICD-10-CM

## 2024-02-23 DIAGNOSIS — G894 Chronic pain syndrome: Secondary | ICD-10-CM | POA: Diagnosis not present

## 2024-02-23 MED ORDER — TRAMADOL HCL 50 MG PO TABS: 50.0000 mg | ORAL_TABLET | Freq: Two times a day (BID) | ORAL | 0 refills | Status: AC | PRN

## 2024-02-23 NOTE — Progress Notes (Signed)
 PROVIDER NOTE: Interpretation of information contained herein should be left to medically-trained personnel. Specific patient instructions are provided elsewhere under Patient Instructions section of medical record. This document was created in part using AI and STT-dictation technology, any transcriptional errors that may result from this process are unintentional.  Patient: Marilyn Davis  Service: E/M   PCP: Sampson Ethridge DELENA, MD  DOB: [birth year]  DOS: 02/23/2024  Provider: Wallie Sherry, MD  MRN: [id number]  Delivery: Virtual Visit  Specialty: Interventional Pain Management  Type: Established Patient  Setting: Ambulatory outpatient facility  Specialty designation: 09  Referring Prov.: Entzminger, Ethridge DELENA, MD  Location: Remote location       Virtual Encounter - Pain Management PROVIDER NOTE: Information contained herein reflects review and annotations entered in association with encounter. Interpretation of such information and data should be left to medically-trained personnel. Information provided to patient can be located elsewhere in the medical record under Patient Instructions. Document created using STT-dictation technology, any transcriptional errors that may result from process are unintentional.    Contact & Pharmacy Preferred: [phone] Home: [phone] (home) Mobile: [phone] (mobile) E-mail: [email or web address]  [facility] - [place], [place] - [address] [place] [address] Phone: [phone] Fax: [phone]  [facility] [place], [place] - [facility] AT Portal to Registered [facility] [address] [place] [address] Phone: [phone] Fax: [phone]  [facility] [id number] [place], [place] - [address] [place] [address] Phone: [phone] Fax: [phone]   Pre-screening  Marilyn Davis offered in-person vs  virtual encounter. She indicated preferring virtual for this encounter.   Reason COVID-19*  Social distancing based on CDC and AMA recommendations.   I contacted Marilyn Davis on 02/23/2024 via telephone.      I clearly identified myself as Wallie Sherry, MD. I verified that I was speaking with the correct person using two identifiers (Name: Marilyn Davis, and date of birth: 1955/06/13).  Consent I sought verbal advanced consent from Marilyn Davis for virtual visit interactions. I informed Marilyn Davis of possible security and privacy concerns, risks, and limitations associated with providing not-in-person medical evaluation and management services. I also informed Marilyn Davis of the availability of in-person appointments. Finally, I informed her that there would be a charge for the virtual visit and that she could be  personally, fully or partially, financially responsible for it. Marilyn Davis expressed understanding and agreed to proceed.   Historic Elements   Marilyn Davis is a 69 y.o. year old, female patient evaluated today after our last contact on 12/23/2023. Marilyn Davis  has a past medical history of Bipolar disorder (HCC), Diabetes mellitus without complication (HCC), and Sciatica. She also  has a past surgical history that includes Tonsillectomy; Abdominal hysterectomy; Exploratory laparotomy; Shoulder surgery; Breast biopsy (Right, 09/30/2019); and Breast biopsy (Right, 09/30/2019). Marilyn Davis has a current medication list which includes the following prescription(s): alendronate, atorvastatin , bupropion , freestyle libre 2 reader, freestyle libre 2 sensor, diazepam , droplet pen needles, fenofibrate  micronized, fluticasone, lamotrigine , lamotrigine , meloxicam, mirtazapine , montelukast , pantoprazole , propranolol, quetiapine , rybelsus , tramadol , trazodone , ubrelvy, linaclotide, and oxycodone -acetaminophen . She  reports that she has never smoked. She has  never used smokeless tobacco. She reports that she does not drink alcohol and does not use drugs. Marilyn Davis is allergic to sumatriptan, pseudoephedrine hcl, cabbage, and ibuprofen.  BMI: Estimated body mass index is 33.28 kg/m as calculated from  the following:   Height as of 12/23/23: 5' 5 (1.651 m).   Weight as of 12/23/23: 200 lb (90.7 kg). Last encounter: 12/23/2023. Last procedure: 07/16/2023.  HPI  Today, she is being contacted for worsening of previously known (established) problem   Increased low back pain related to lumbar facet arthropathy, patient requesting repeat lumbar RFA small prescription for tramadol  to help with her breakthrough pain.  She states that last week was difficult and she almost went to the emergency department.  She states that her pain score has been ranging from a 7-8 out of 10  Laboratory Chemistry Profile   Renal Lab Results  Component Value Date   BUN 18 07/29/2021   CREATININE 1.06 (H) 07/29/2021   GFRAA 51 (L) 11/11/2019   GFRNONAA 58 (L) 07/29/2021    Hepatic Lab Results  Component Value Date   AST 22 07/29/2021   ALT 26 07/29/2021   ALBUMIN 4.3 07/29/2021   ALKPHOS 26 (L) 07/29/2021   LIPASE 70 (H) 07/29/2021    Electrolytes Lab Results  Component Value Date   NA 139 07/29/2021   K 4.2 07/29/2021   CL 111 07/29/2021   CALCIUM  9.8 07/29/2021    Bone No results found for: VD25OH, VD125OH2TOT, CI6874NY7, CI7874NY7, 25OHVITD1, 25OHVITD2, 25OHVITD3, TESTOFREE, TESTOSTERONE  Inflammation (CRP: Acute Phase) (ESR: Chronic Phase) No results found for: CRP, ESRSEDRATE, LATICACIDVEN       Note: Above Lab results reviewed.  Imaging  US  Carotid Bilateral CLINICAL DATA:  Dizziness  EXAM: BILATERAL CAROTID DUPLEX ULTRASOUND  TECHNIQUE: Elnor scale imaging, color Doppler and duplex ultrasound were performed of bilateral carotid and vertebral arteries in the neck.  COMPARISON:  None  Available.  FINDINGS: Criteria: Quantification of carotid stenosis is based on velocity parameters that correlate the residual internal carotid diameter with NASCET-based stenosis levels, using the diameter of the distal internal carotid lumen as the denominator for stenosis measurement.  The following velocity measurements were obtained:  RIGHT  ICA: 85 cm/sec  CCA: 91 cm/sec  SYSTOLIC ICA/CCA RATIO:  0.9  ECA: 81 cm/sec  LEFT  ICA: 80 cm/sec  CCA: 90 cm/sec  SYSTOLIC ICA/CCA RATIO:  0.9  ECA: 87 cm/sec  RIGHT CAROTID ARTERY: Patent.  RIGHT VERTEBRAL ARTERY:  Antegrade.  LEFT CAROTID ARTERY:  Patent.  LEFT VERTEBRAL ARTERY:  Antegrade.  IMPRESSION: 1. Right carotid system: No evidence of flow-limiting stenosis. 2. Left carotid system: No evidence of flow-limiting stenosis.  Electronically Signed   By: Maude Naegeli M.D.   On: 09/23/2023 06:35  Assessment  The primary encounter diagnosis was Lumbar facet arthropathy. Diagnoses of Lumbar spondylosis and Chronic pain syndrome were also pertinent to this visit.  Plan of Care  The patient is following up after bilateral lumbar medial branch radiofrequency ablation at L3-L5 on 07/16/2023, from which she experienced approximately 75% pain relief lasting nearly six months with improved function and sleep. Over the past 4-6 weeks, she reports recurrence of significant axial low back pain, rated 7-8/10, with worsening limitation in ADLs and nearly presenting to the emergency department. Her history is consistent with recurrent facet-mediated low back pain without new radicular or red-flag symptoms, and given her prior durable response, she is an appropriate candidate for repeat bilateral lumbar RFA at L3-L5. We will proceed with scheduling pending authorization. Given acute escalation of symptoms, a short, limited prescription of tramadol  will be provided for severe breakthrough pain only until the procedure is completed, with  counseling provided regarding risks, safety precautions, and avoidance of  other sedating medications.  PMP checked and reviewed.  Marilyn Davis has a current medication list which includes the following long-term medication(s): atorvastatin , bupropion , fluticasone, lamotrigine , lamotrigine , mirtazapine , montelukast , pantoprazole , quetiapine , trazodone , and linaclotide.  Pharmacotherapy (Medications Ordered): Meds ordered this encounter  Medications   traMADol  (ULTRAM ) 50 MG tablet    Sig: Take 1 tablet (50 mg total) by mouth every 12 (twelve) hours as needed for severe pain (pain score 7-10).    Dispense:  30 tablet    Refill:  0    Fill one day early if pharmacy is closed on scheduled refill date.   Orders:  Orders Placed This Encounter  Procedures   Radiofrequency,Lumbar    Standing Status:   Future    Expected Date:   02/25/2024    Expiration Date:   02/22/2025    Scheduling Instructions:     Side(s): Bilateral     Level(s): L3, L4, L5, Medial Branch Nerve(s)     Sedation: With Sedation     Scheduling Timeframe: As soon as pre-approved    Where will this procedure be performed?:   [facility]   Follow-up plan:   Return in about 2 days (around 02/25/2024) for B/L L3,4,5 RFA.      B/L L3,4,5 MBNB #1 09/11/22, 10/30/22, B/L L3,4,5 RFA 12/02/22, 07/16/23      Recent Visits Date Type Provider Dept  12/23/23 Office Visit Marcelino Nurse, MD [facility]  Showing recent visits within past 90 days and meeting all other requirements Today's Visits Date Type Provider Dept  02/23/24 Office Visit Marcelino Nurse, MD [facility]  Showing today's visits and meeting all other requirements Future Appointments No visits were found meeting these conditions. Showing future appointments within next 90 days and meeting all other requirements  I discussed the assessment and treatment plan with the patient. The patient was provided an opportunity to ask questions  and all were answered. The patient agreed with the plan and demonstrated an understanding of the instructions.  Patient advised to call back or seek an in-person evaluation if the symptoms or condition worsens.  Duration of encounter: .  Note by: Nurse Marcelino, MD Date: 02/23/2024; Time: 10:51 AM

## 2024-03-08 ENCOUNTER — Ambulatory Visit: Admitting: Psychiatry

## 2024-03-08 ENCOUNTER — Other Ambulatory Visit: Payer: Self-pay | Admitting: Psychiatry

## 2024-03-08 ENCOUNTER — Other Ambulatory Visit: Payer: Self-pay

## 2024-03-08 ENCOUNTER — Encounter: Payer: Self-pay | Admitting: Psychiatry

## 2024-03-08 VITALS — BP 109/64 | HR 86 | Temp 97.8°F | Ht 65.0 in | Wt 200.8 lb

## 2024-03-08 DIAGNOSIS — F4312 Post-traumatic stress disorder, chronic: Secondary | ICD-10-CM

## 2024-03-08 DIAGNOSIS — F603 Borderline personality disorder: Secondary | ICD-10-CM

## 2024-03-08 DIAGNOSIS — F840 Autistic disorder: Secondary | ICD-10-CM

## 2024-03-08 DIAGNOSIS — F3174 Bipolar disorder, in full remission, most recent episode manic: Secondary | ICD-10-CM | POA: Diagnosis not present

## 2024-03-08 DIAGNOSIS — F418 Other specified anxiety disorders: Secondary | ICD-10-CM

## 2024-03-08 MED ORDER — LAMOTRIGINE 25 MG PO TABS: 75.0000 mg | ORAL_TABLET | Freq: Every day | ORAL | 1 refills | Status: AC

## 2024-03-08 NOTE — Progress Notes (Unsigned)
 [facility] MD OP Progress Note  03/08/2024 11:34 AM CALAIS SVEHLA  MRN:  [id number]  Chief Complaint:  Chief Complaint  Patient presents with   Follow-up   Anxiety   Depression   Medication Refill   Discussed the use of AI scribe software for clinical note transcription with the patient, who gave verbal consent to proceed.  History of Present Illness Marilyn Davis is a 69 year old Caucasian female, married, lives in [place], has a history of bipolar disorder, borderline personality disorder, autism spectrum, multiple medical problems including diabetes mellitus, sciatica, dizziness, tremors was evaluated in office today for a follow-up appointment.  Ongoing interpersonal stress related to her husband's constant presence and talking overwhelms her and creates difficulty in managing daily interactions. She expresses a need for quiet and personal space but finds it challenging to communicate these needs effectively. She encourages her husband to pursue outside activities, such as fishing, to create more space for herself, yet she notes that he remains present most of the time.  Identifying as high functioning on the autism spectrum, she describes sensory sensitivities, including a preference for low stimulation and repetitive behaviors such as hand movements and teeth clenching. She finds support and understanding through online communities and podcasts focused on autism, which help her connect with others who share similar experiences.  Feeling a little down during the winter months, she reports symptoms consistent with seasonal affective disorder. She uses a light therapy device for 20-30 minutes in the morning while reading or doing diamond painting and finds that this intervention improves her mood. She denies suicidal ideation, self-harm, and thoughts of harming others.  Decreased appetite prompts her to force herself to eat. She currently takes Wellbutrin  (bupropion ) however  would like to stay on the current dosage for now.  Concern about maintaining nutrition leads her to consider a vegetarian diet, ensuring she consumes plant-based protein, dairy, and eggs.  She participates in online communities and support groups, including watching autism-related podcasts and interacting with others online.  She is interested in establish care with a therapist at the office.   Visit Diagnosis:    ICD-10-CM   1. Bipolar 1 disorder, manic, full remission  F31.74     2. Borderline personality disorder (HCC)  F60.3 lamoTRIgine  (LAMICTAL ) 25 MG tablet    3. Autism spectrum disorder  F84.0     4. Other specified anxiety disorders  F41.8    With limited symptom attacks    5. Chronic post-traumatic stress disorder (PTSD)  F43.12       Past Psychiatric History: I have reviewed past psychiatric history from progress note on 07/31/2021.  Past Medical History:  Past Medical History:  Diagnosis Date   Bipolar disorder (HCC)    Diabetes mellitus without complication (HCC)    Sciatica     Past Surgical History:  Procedure Laterality Date   ABDOMINAL HYSTERECTOMY     BREAST BIOPSY Right 09/30/2019   9:00, 5cmfn, Q shape, neg   BREAST BIOPSY Right 09/30/2019   9:00, 9cmfn, vision, neg   EXPLORATORY LAPAROTOMY     SHOULDER SURGERY     TONSILLECTOMY      Family Psychiatric History: I have reviewed family psychiatric history from progress note on 07/31/2021.  Family History:  Family History  Problem Relation Age of Onset   Personality disorder Mother    Bipolar disorder Father    Alcohol abuse Brother    Drug abuse Brother    Suicidality Other    Breast cancer Neg  Hx     Social History: I have reviewed social history from progress note on 08/17/2021. Social History   Socioeconomic History   Marital status: Married    Spouse name: Marilyn Davis   Number of children: 2   Years of education: Not on file   Highest education level: Some college, no degree  Occupational  History   Not on file  Tobacco Use   Smoking status: Never   Smokeless tobacco: Never  Vaping Use   Vaping status: Never Used  Substance and Sexual Activity   Alcohol use: Never   Drug use: Never   Sexual activity: Not Currently  Other Topics Concern   Not on file  Social History Narrative   Not on file   Social Drivers of Health   Tobacco Use: Low Risk (03/08/2024)   Patient History    Smoking Tobacco Use: Never    Smokeless Tobacco Use: Never    Passive Exposure: Not on file  Financial Resource Strain: Not on file  Food Insecurity: Not on file  Transportation Needs: Not on file  Physical Activity: Not on file  Stress: Not on file  Social Connections: Not on file  Depression (EYV7-0): Low Risk (12/08/2023)   Depression (PHQ2-9)    PHQ-2 Score: 0  Alcohol Screen: Not on file  Housing: Not on file  Utilities: Not on file  Health Literacy: Low Risk (01/26/2024)   Received from [facility]   Health Literacy    : Never    Allergies: Allergies[1]  Metabolic Disorder Labs: Lab Results  Component Value Date   HGBA1C 6.2 (H) 03/13/2019   MPG 131.24 03/13/2019   Lab Results  Component Value Date   PROLACTIN 17.2 08/02/2021   No results found for: CHOL, TRIG, HDL, CHOLHDL, VLDL, LDLCALC Lab Results  Component Value Date   TSH 1.231 08/02/2021   TSH 1.178 03/13/2019    Therapeutic Level Labs: No results found for: LITHIUM No results found for: VALPROATE No results found for: CBMZ  Current Medications: Current Outpatient Medications  Medication Sig Dispense Refill   alendronate (FOSAMAX) 70 MG tablet Take 70 mg by mouth once a week.     atorvastatin  (LIPITOR) 10 MG tablet Take 1 tablet (10 mg total) by mouth daily at 6 PM. 30 tablet 1   buPROPion  (WELLBUTRIN  XL) 150 MG 24 hr tablet TAKE 1 TABLET (150 MG TOTAL) BY MOUTH DAILY. 90 tablet 3   Continuous Glucose Receiver (FREESTYLE LIBRE 2 READER) DEVI USE 1 DEVICE AS DIRECTED TO MONITOR  BLOOD SUGARS FOR DIABETES E11.649 AND E11.42     Continuous Glucose Sensor (FREESTYLE LIBRE 2 SENSOR) MISC      diazepam  (VALIUM ) 5 MG tablet Take 1 tablet (5 mg total) by mouth daily as needed for anxiety. Take prior to dental procedure ( after consulting with dentist) 3 tablet 0   DROPLET PEN NEEDLES 32G X 6 MM MISC      fenofibrate  micronized (LOFIBRA) 200 MG capsule Take 200 mg by mouth daily.     fluticasone (FLONASE) 50 MCG/ACT nasal spray Place 2 sprays into both nostrils daily.     lamoTRIgine  (LAMICTAL ) 200 MG tablet TAKE 1 TABLET (200 MG TOTAL) BY MOUTH DAILY. TAKE ALONG WITH 50 MG DAILY FOR TOTAL DAILY DOSE OF 250 MG 90 tablet 3   lamoTRIgine  (LAMICTAL ) 25 MG tablet Take 3 tablets (75 mg total) by mouth daily. TAKE ALONG WITH 200MG  DAILY FOR TOTAL OF 275MG  DAILY 270 tablet 1   linaclotide (LINZESS)  145 MCG CAPS capsule Take 145 mcg by mouth daily. (Patient not taking: Reported on 02/17/2024)     meloxicam (MOBIC) 15 MG tablet Take 15 mg by mouth daily.     mirtazapine  (REMERON ) 15 MG tablet TAKE 1 TABLET AT BEDTIME 90 tablet 3   montelukast  (SINGULAIR ) 10 MG tablet Take 1 tablet (10 mg total) by mouth at bedtime. 30 tablet 1   oxyCODONE -acetaminophen  (PERCOCET) 10-325 MG tablet Take 1 tablet by mouth 3 (three) times daily. (Patient not taking: Reported on 02/17/2024)     pantoprazole  (PROTONIX ) 40 MG tablet Take 40 mg by mouth daily.     propranolol (INDERAL) 20 MG tablet Take 20 mg by mouth 2 (two) times daily.     QUEtiapine  (SEROQUEL ) 100 MG tablet TAKE 2 TABLETS AT BEDTIME 180 tablet 3   RYBELSUS  3 MG TABS Take 3 mg by mouth daily.     traMADol  (ULTRAM ) 50 MG tablet Take 1 tablet (50 mg total) by mouth every 12 (twelve) hours as needed for severe pain (pain score 7-10). 30 tablet 0   traZODone  (DESYREL ) 100 MG tablet Take 1-2 tablets (100-200 mg total) by mouth at bedtime as needed for sleep. 180 tablet 3   UBRELVY 50 MG TABS Take 1 tablet by mouth daily.     No current  facility-administered medications for this visit.     Musculoskeletal: Strength & Muscle Tone: within normal limits Gait & Station: normal Patient leans: N/A  Psychiatric Specialty Exam: Review of Systems  Psychiatric/Behavioral:  The patient is nervous/anxious.     Blood pressure 109/64, pulse 86, temperature 97.8 F (36.6 C), temperature source Temporal, height 5' 5 (1.651 m), weight 200 lb 12.8 oz (91.1 kg).Body mass index is 33.41 kg/m.  General Appearance: Fairly Groomed  Eye Contact:  Fair  Speech:  Clear and Coherent  Volume:  Normal  Mood:  Anxious  Affect:  Appropriate  Thought Process:  Goal Directed and Descriptions of Associations: Intact  Orientation:  Full (Time, Place, and Person)  Thought Content: Logical   Suicidal Thoughts:  No  Homicidal Thoughts:  No  Memory:  Immediate;   Fair Recent;   Fair Remote;   Fair  Judgement:  Fair  Insight:  Fair  Psychomotor Activity:  Normal  Concentration:  Concentration: Fair and Attention Span: Fair  Recall:  [employer] of Knowledge: Fair  Language: Fair  Akathisia:  No  Handed:  Ambidextrous  AIMS (if indicated): done  Assets:  Communication Skills Desire for Improvement Housing Social Support Transportation  ADL's:  Intact  Cognition: WNL  Sleep:  Fair   Screenings: Geneticist, Molecular Office Visit from 03/08/2024 in [facility] Office Visit from 12/08/2023 in [facility] Office Visit from 09/10/2023 in [facility] Office Visit from 02/26/2023 in [facility] Office Visit from 12/26/2022 in [facility]  AIMS Total Score 0 0 0 0 0   AUDIT    Flowsheet Row Admission (Discharged) from 03/14/2019 in [facility]  Alcohol Use Disorder Identification Test Final Score (AUDIT) 0   GAD-7     Flowsheet Row Office Visit from 12/08/2023 in [facility] Office Visit from 02/26/2023 in [facility] Office Visit from 12/26/2022 in [facility] Office Visit from 10/24/2022 in [facility] Office  Visit from 07/22/2022 in [facility]  Total GAD-7 Score 3 0 5 1 7    PHQ2-9    Flowsheet Row Office Visit from 12/08/2023 in [facility] Office Visit from 09/11/2023 in [facility] Procedure visit from 07/16/2023 in [facility] Visit from 04/30/2023 in [facility] Care Visit from 02/26/2023 in [facility]  PHQ-2 Total Score 0 0 1 6 0   Flowsheet Row Office Visit from 12/08/2023 in [facility] Office Visit from 09/10/2023 in [facility] Office Visit from 02/26/2023 in [facility]  C-SSRS RISK CATEGORY Moderate Risk Moderate Risk No Risk     Assessment and Plan: VEARL AITKEN is a 69 year old Caucasian female who presented for a follow-up appointment, discussed assessment and plan as noted below.  1. Bipolar 1 disorder, manic, full remission Currently although down at times due to the winter months overall has been managing well.  Currently making use of light therapy at home.  Does report loss of appetite although maintaining her weight.  Will consider reducing the dosage of Wellbutrin  in the future however she declines at this time. Continue Seroquel  200 mg at bedtime Continue Mirtazapine  15 mg at  bedtime Continue Lamictal  275 mg daily Continue Wellbutrin  XL 150 mg daily  2. Borderline personality disorder (HCC)-stable Interested in establishing care with a therapist Patient to start psychotherapy with [name].  3. Autism spectrum disorder-chronic Patient encouraged to establish care with therapist.  Patient currently has support groups available.  4. Other specified anxiety disorders-stable Overall all managing anxiety symptoms well. Continue Mirtazapine  15 mg at bedtime Referred for psychotherapy sessions  5. Chronic post-traumatic stress disorder (PTSD)-stable Currently denies any significant intrusive memories flashbacks nightmares. Continue Mirtazapine  15 mg at bedtime Continue Trazodone  100-200 mg at bedtime Patient referred to [name] for psychotherapy.  Follow-up Follow-up in clinic in 1 month or sooner if needed.  Collaboration of Care: Collaboration of Care: Referral or follow-up with counselor/therapist AEB patient to establish care with Ms. Davis  Patient/Guardian was advised Release of Information must be obtained prior to any record release in order to collaborate their care with an outside provider. Patient/Guardian was advised if they have not already done so to contact the registration department to sign all necessary forms in order for us  to release information regarding their care.   Consent: Patient/Guardian gives verbal consent for treatment and assignment of benefits for services provided during this visit. Patient/Guardian expressed understanding and agreed to proceed.  This note was generated in part or whole with voice recognition software. Voice recognition is usually quite accurate but there are transcription errors that can and very often do occur. I apologize for any typographical errors that were not detected and corrected.     Channell Quattrone, MD 03/08/2024, 11:34 AM     [1]  Allergies Allergen Reactions    Sumatriptan Shortness Of Breath    Facial numbness   Pseudoephedrine Hcl Other (See Comments)    manic Other reaction(s): Hallucinations   Cabbage Nausea And Vomiting    Cooked    Ibuprofen Itching

## 2024-03-18 ENCOUNTER — Ambulatory Visit: Admitting: Professional Counselor

## 2024-03-22 ENCOUNTER — Encounter: Payer: Self-pay | Admitting: Student in an Organized Health Care Education/Training Program

## 2024-03-22 ENCOUNTER — Ambulatory Visit
Admission: RE | Admit: 2024-03-22 | Discharge: 2024-03-22 | Disposition: A | Source: Ambulatory Visit | Attending: Student in an Organized Health Care Education/Training Program | Admitting: Student in an Organized Health Care Education/Training Program

## 2024-03-22 ENCOUNTER — Ambulatory Visit: Admitting: Student in an Organized Health Care Education/Training Program

## 2024-03-22 VITALS — BP 118/79 | HR 74 | Temp 96.8°F | Resp 17 | Ht 65.0 in | Wt 200.0 lb

## 2024-03-22 DIAGNOSIS — M47816 Spondylosis without myelopathy or radiculopathy, lumbar region: Secondary | ICD-10-CM

## 2024-03-22 DIAGNOSIS — G894 Chronic pain syndrome: Secondary | ICD-10-CM

## 2024-03-22 MED ORDER — DEXAMETHASONE SOD PHOSPHATE PF 10 MG/ML IJ SOLN
INTRAMUSCULAR | Status: AC
  Filled 2024-03-22: qty 1

## 2024-03-22 MED ORDER — MIDAZOLAM HCL 2 MG/2ML IJ SOLN
INTRAMUSCULAR | Status: AC
  Filled 2024-03-22: qty 2

## 2024-03-22 MED ORDER — LIDOCAINE HCL 2 % IJ SOLN
20.0000 mL | Freq: Once | INTRAMUSCULAR | Status: AC
  Administered 2024-03-22: 400 mg

## 2024-03-22 MED ORDER — LIDOCAINE HCL 2 % IJ SOLN
INTRAMUSCULAR | Status: AC
  Filled 2024-03-22: qty 20

## 2024-03-22 MED ORDER — ROPIVACAINE HCL 2 MG/ML IJ SOLN
18.0000 mL | Freq: Once | INTRAMUSCULAR | Status: AC
  Administered 2024-03-22: 18 mL via PERINEURAL

## 2024-03-22 MED ORDER — MIDAZOLAM HCL (PF) 2 MG/2ML IJ SOLN
0.5000 mg | Freq: Once | INTRAMUSCULAR | Status: AC
  Administered 2024-03-22: 2 mg via INTRAVENOUS

## 2024-03-22 MED ORDER — FENTANYL CITRATE (PF) 100 MCG/2ML IJ SOLN
INTRAMUSCULAR | Status: AC
  Filled 2024-03-22: qty 2

## 2024-03-22 MED ORDER — ROPIVACAINE HCL 2 MG/ML IJ SOLN
INTRAMUSCULAR | Status: AC
  Filled 2024-03-22: qty 20

## 2024-03-22 MED ORDER — DEXAMETHASONE SOD PHOSPHATE PF 10 MG/ML IJ SOLN
20.0000 mg | Freq: Once | INTRAMUSCULAR | Status: AC
  Administered 2024-03-22: 20 mg

## 2024-03-22 MED ORDER — LACTATED RINGERS IV SOLN: Freq: Once | INTRAVENOUS | Status: AC

## 2024-03-22 MED ORDER — FENTANYL CITRATE (PF) 100 MCG/2ML IJ SOLN
25.0000 ug | INTRAMUSCULAR | Status: AC | PRN
  Administered 2024-03-22: 50 ug via INTRAVENOUS
  Administered 2024-03-22: 25 ug via INTRAVENOUS

## 2024-03-22 NOTE — Patient Instructions (Signed)

## 2024-03-23 ENCOUNTER — Telehealth: Payer: Self-pay

## 2024-03-23 NOTE — Telephone Encounter (Signed)
 Post procedure follow up.  Husband states she is doing good.

## 2024-04-07 ENCOUNTER — Ambulatory Visit: Admitting: Professional Counselor

## 2024-04-22 ENCOUNTER — Ambulatory Visit: Admitting: Psychiatry

## 2024-05-17 ENCOUNTER — Ambulatory Visit: Payer: Self-pay | Admitting: Student in an Organized Health Care Education/Training Program
